# Patient Record
Sex: Female | Born: 1955 | Race: Black or African American | Hispanic: No | Marital: Married | Smoking: Never smoker
Health system: Southern US, Community
[De-identification: ages and names within clinical notes are randomized; demographics above are authoritative.]

## PROBLEM LIST (undated history)

## (undated) DIAGNOSIS — H269 Unspecified cataract: Secondary | ICD-10-CM

## (undated) DIAGNOSIS — Z803 Family history of malignant neoplasm of breast: Secondary | ICD-10-CM

## (undated) DIAGNOSIS — N189 Chronic kidney disease, unspecified: Secondary | ICD-10-CM

## (undated) DIAGNOSIS — D649 Anemia, unspecified: Secondary | ICD-10-CM

## (undated) DIAGNOSIS — Z9889 Other specified postprocedural states: Secondary | ICD-10-CM

## (undated) DIAGNOSIS — E785 Hyperlipidemia, unspecified: Secondary | ICD-10-CM

## (undated) DIAGNOSIS — Z807 Family history of other malignant neoplasms of lymphoid, hematopoietic and related tissues: Secondary | ICD-10-CM

## (undated) DIAGNOSIS — R112 Nausea with vomiting, unspecified: Secondary | ICD-10-CM

## (undated) DIAGNOSIS — T7840XA Allergy, unspecified, initial encounter: Secondary | ICD-10-CM

## (undated) DIAGNOSIS — G4730 Sleep apnea, unspecified: Secondary | ICD-10-CM

## (undated) DIAGNOSIS — C801 Malignant (primary) neoplasm, unspecified: Secondary | ICD-10-CM

## (undated) DIAGNOSIS — E119 Type 2 diabetes mellitus without complications: Secondary | ICD-10-CM

## (undated) DIAGNOSIS — M1990 Unspecified osteoarthritis, unspecified site: Secondary | ICD-10-CM

## (undated) DIAGNOSIS — I10 Essential (primary) hypertension: Secondary | ICD-10-CM

## (undated) DIAGNOSIS — Z87442 Personal history of urinary calculi: Secondary | ICD-10-CM

## (undated) DIAGNOSIS — K219 Gastro-esophageal reflux disease without esophagitis: Secondary | ICD-10-CM

## (undated) HISTORY — PX: TONSILLECTOMY: SUR1361

## (undated) HISTORY — PX: OTHER SURGICAL HISTORY: SHX169

## (undated) HISTORY — DX: Type 2 diabetes mellitus without complications: E11.9

## (undated) HISTORY — DX: Allergy, unspecified, initial encounter: T78.40XA

## (undated) HISTORY — DX: Family history of malignant neoplasm of breast: Z80.3

## (undated) HISTORY — DX: Hyperlipidemia, unspecified: E78.5

## (undated) HISTORY — PX: CYSTOSCOPY: SUR368

## (undated) HISTORY — DX: Family history of other malignant neoplasms of lymphoid, hematopoietic and related tissues: Z80.7

## (undated) HISTORY — DX: Essential (primary) hypertension: I10

---

## 1997-06-07 HISTORY — PX: BREAST SURGERY: SHX581

## 1998-03-31 ENCOUNTER — Ambulatory Visit (HOSPITAL_BASED_OUTPATIENT_CLINIC_OR_DEPARTMENT_OTHER): Admission: RE | Admit: 1998-03-31 | Discharge: 1998-03-31 | Payer: Self-pay | Admitting: Specialist

## 1999-02-18 ENCOUNTER — Other Ambulatory Visit: Admission: RE | Admit: 1999-02-18 | Discharge: 1999-02-18 | Payer: Self-pay | Admitting: Obstetrics and Gynecology

## 2000-05-17 ENCOUNTER — Other Ambulatory Visit: Admission: RE | Admit: 2000-05-17 | Discharge: 2000-05-17 | Payer: Self-pay | Admitting: Obstetrics and Gynecology

## 2000-05-23 ENCOUNTER — Encounter: Payer: Self-pay | Admitting: Internal Medicine

## 2000-05-23 ENCOUNTER — Encounter: Admission: RE | Admit: 2000-05-23 | Discharge: 2000-05-23 | Payer: Self-pay | Admitting: Internal Medicine

## 2000-05-27 ENCOUNTER — Encounter: Payer: Self-pay | Admitting: Internal Medicine

## 2000-05-27 ENCOUNTER — Encounter: Admission: RE | Admit: 2000-05-27 | Discharge: 2000-05-27 | Payer: Self-pay | Admitting: Internal Medicine

## 2000-06-07 HISTORY — PX: VAGINAL HYSTERECTOMY: SUR661

## 2000-08-09 ENCOUNTER — Other Ambulatory Visit: Admission: RE | Admit: 2000-08-09 | Discharge: 2000-08-09 | Payer: Self-pay | Admitting: Obstetrics and Gynecology

## 2000-08-09 ENCOUNTER — Encounter (INDEPENDENT_AMBULATORY_CARE_PROVIDER_SITE_OTHER): Payer: Self-pay | Admitting: Specialist

## 2000-11-17 ENCOUNTER — Inpatient Hospital Stay (HOSPITAL_COMMUNITY): Admission: RE | Admit: 2000-11-17 | Discharge: 2000-11-22 | Payer: Self-pay | Admitting: Obstetrics and Gynecology

## 2000-11-17 ENCOUNTER — Encounter (INDEPENDENT_AMBULATORY_CARE_PROVIDER_SITE_OTHER): Payer: Self-pay | Admitting: Specialist

## 2000-11-18 ENCOUNTER — Encounter: Payer: Self-pay | Admitting: Obstetrics and Gynecology

## 2000-11-20 ENCOUNTER — Encounter: Payer: Self-pay | Admitting: Obstetrics and Gynecology

## 2001-06-07 HISTORY — PX: CHOLECYSTECTOMY: SHX55

## 2002-02-10 ENCOUNTER — Ambulatory Visit (HOSPITAL_COMMUNITY): Admission: EM | Admit: 2002-02-10 | Discharge: 2002-02-10 | Payer: Self-pay | Admitting: *Deleted

## 2002-03-09 ENCOUNTER — Encounter (INDEPENDENT_AMBULATORY_CARE_PROVIDER_SITE_OTHER): Payer: Self-pay | Admitting: *Deleted

## 2002-03-09 ENCOUNTER — Observation Stay (HOSPITAL_COMMUNITY): Admission: RE | Admit: 2002-03-09 | Discharge: 2002-03-10 | Payer: Self-pay

## 2005-02-02 ENCOUNTER — Ambulatory Visit: Payer: Self-pay

## 2006-03-30 ENCOUNTER — Ambulatory Visit (HOSPITAL_COMMUNITY): Admission: RE | Admit: 2006-03-30 | Discharge: 2006-03-30 | Payer: Self-pay | Admitting: Internal Medicine

## 2007-11-06 HISTORY — PX: OTHER SURGICAL HISTORY: SHX169

## 2007-11-08 ENCOUNTER — Ambulatory Visit (HOSPITAL_COMMUNITY): Admission: RE | Admit: 2007-11-08 | Discharge: 2007-11-08 | Payer: Self-pay | Admitting: Internal Medicine

## 2009-01-22 ENCOUNTER — Ambulatory Visit (HOSPITAL_COMMUNITY): Admission: RE | Admit: 2009-01-22 | Discharge: 2009-01-22 | Payer: Self-pay | Admitting: Internal Medicine

## 2010-04-23 ENCOUNTER — Encounter: Admission: RE | Admit: 2010-04-23 | Discharge: 2010-04-23 | Payer: Self-pay | Admitting: Family Medicine

## 2010-05-25 ENCOUNTER — Ambulatory Visit (HOSPITAL_COMMUNITY)
Admission: RE | Admit: 2010-05-25 | Discharge: 2010-05-25 | Payer: Self-pay | Source: Home / Self Care | Attending: Urology | Admitting: Urology

## 2010-05-26 ENCOUNTER — Ambulatory Visit (HOSPITAL_COMMUNITY)
Admission: RE | Admit: 2010-05-26 | Discharge: 2010-05-26 | Payer: Self-pay | Source: Home / Self Care | Attending: Urology | Admitting: Urology

## 2010-08-17 LAB — GLUCOSE, CAPILLARY
Glucose-Capillary: 117 mg/dL — ABNORMAL HIGH (ref 70–99)
Glucose-Capillary: 117 mg/dL — ABNORMAL HIGH (ref 70–99)
Glucose-Capillary: 149 mg/dL — ABNORMAL HIGH (ref 70–99)

## 2010-08-17 LAB — COMPREHENSIVE METABOLIC PANEL
ALT: 22 U/L (ref 0–35)
AST: 18 U/L (ref 0–37)
Albumin: 3.5 g/dL (ref 3.5–5.2)
Alkaline Phosphatase: 61 U/L (ref 39–117)
BUN: 17 mg/dL (ref 6–23)
CO2: 28 mEq/L (ref 19–32)
Calcium: 9 mg/dL (ref 8.4–10.5)
Chloride: 104 mEq/L (ref 96–112)
Creatinine, Ser: 0.88 mg/dL (ref 0.4–1.2)
GFR calc Af Amer: >60 mL/min (ref >60)
GFR calc non Af Amer: >60 mL/min (ref >60)
Glucose, Bld: 114 mg/dL — ABNORMAL HIGH (ref 70–99)
Potassium: 4.4 mEq/L (ref 3.5–5.1)
Sodium: 139 mEq/L (ref 135–145)
Total Bilirubin: 0.4 mg/dL (ref 0.3–1.2)
Total Protein: 7 g/dL (ref 6.0–8.3)

## 2010-08-17 LAB — SURGICAL PCR SCREEN
MRSA, PCR: NEGATIVE
Staphylococcus aureus: POSITIVE — AB

## 2010-10-23 NOTE — Op Note (Signed)
NAME:  Tracy Garrett, Tracy Garrett                     ACCOUNT NO.:  [id number]   MEDICAL RECORD NO.:  [id number]                   PATIENT TYPE:  AMB   LOCATION:  DAY                                  FACILITY:  [facility]   PHYSICIAN:  Skeet Simmer., M.D.         DATE OF BIRTH:  05-Oct-1955   DATE OF PROCEDURE:  03/09/2002  DATE OF DISCHARGE:                                 OPERATIVE REPORT   PREOPERATIVE DIAGNOSES:  Symptomatic gallstones and chronic cholecystitis.   POSTOPERATIVE DIAGNOSES:  Symptomatic gallstones and chronic cholecystitis.   OPERATION:  Laparoscopic cholecystectomy.   SURGEON:  Zigmund Daniel, M.D.   ASSISTANT:  Gabrielle Dare. Janee Morn, M.D.   ANESTHESIA:  General.   DESCRIPTION OF PROCEDURE:  After the patient was monitored and anesthetized  and had routine preparation and draping of the abdomen,  I liberally infused  0.5% bupivacaine with epinephrine just inferior to the umbilicus and made a  transverse incision about 2 cm long, dissected down through the fat until I  identified the abdominal fascia and incised it longitudinally. After bluntly  entering the peritoneal cavity, placing #0 Vicryl pursestring suture in the  fascia, securing a Hasson cannula with the pursestring and inflating the  abdomen with CO2, I examined the abdominal organs. I saw no abnormalities  except for adhesions of the colon, duodenum and omentum to the undersurface  of the gallbladder. After anesthetizing three additional port sites and  placing three additional ports, I grasped the fundus of the gallbladder and  elevated it toward the right shoulder. With the patient head up, foot down  and tilted to the left, I carefully took down the adhesions exposing the  infundibulum of the gallbladder. I pulled it laterally and dissected in the  area of the hepatoduodenal ligament until I clearly identified the cystic  duct emerging from the infundibulum of the gallbladder and clearly  identified  the common bile duct well away from it. I flipped the cystic duct  with four clips and cut it between the two which were closest to the  gallbladder. I then identified the cystic artery and similarly clipped and  divided it. I dissected the gallbladder from the liver utilizing the hook  and spatula cauteries and then got good hemostasis in the gallbladder fossa  with the cautery. In taking the gallbladder off, I accidentally made a  couple of holes in it. Some stones spilled out and I suctioned those away  and suctioned away the bile and thoroughly irrigated it to dilute it out.  After removing the gallbladder from the liver, I placed it in a plastic  pouch and then removed it from the body through the umbilical incision. I  then further irrigated the right upper quadrant and removed the irrigant  until it was clear. I assured good hemostasis and then removed the lateral  ports under direct vision after tying the pursestring suture in the  umbilical incision. I allowed the  CO2 to escape and then removed the  epigastric port. I closed all skin incisions with intracuticular 4-0 Vicryl  and Steri-Strips and applied bandages. The patient tolerated the operation  well.                                               Skeet Simmer., M.D.    Elvis Coil  D:  03/09/2002  T:  03/09/2002  Job:  [id number]

## 2010-10-23 NOTE — H&P (Signed)
[facility]  Patient:    Tracy Garrett, Tracy Garrett                    MRN: [id number] Adm. Date:  11/17/00 Attending:  Nena Jordan A. Cherly Hensen, M.D.                         History and Physical  DATE OF BIRTH:                1956-02-18  CHIEF COMPLAINT:              Heavy menses and uterine fibroids.  HISTORY OF PRESENT ILLNESS:   This is a 55 year old gravida 7, para 2-0-5-2 married black female now being admitted for total abdominal hysterectomy secondary to symptomatic uterine fibroids.  The patient has had a long history of uterine fibroids.  She was found to be anemic by her primary care physician.  An ultrasound done on May 27, 2000 at [facility] had revealed a uterus that measured 15 cm in length with multiple fibroids.  The largest was in the posterior aspect of the uterus, measuring 8 cm.  The ovaries could not be visualized due to the enlarged fibroid.  The patient has been using two super tampons for years with her menstrual flow despite reporting clinically normal cycles in the past.  She felt this was a normal event, since it had been ongoing for years.  The patient underwent an endometrial biopsy on August 08, 2000 that showed disordered proliferative endometrium.  Pap in December 2000 was within normal limits.  She was placed on Lupron Depot therapy along with iron supplementation in order to increase her hemoglobin.  The patient now presents for definitive management of her symptomatology.  ALLERGIES:                    No known drug allergies.  MEDICATIONS:                  Iron supplementation.  Allegra p.r.n.  Her last Lupron Depot injections was in May 2002.  PAST MEDICAL HISTORY:         GERD.  Fibroid uterus.  History of hyperlipidemia.  Exogenous obesity.  PAST SURGICAL HISTORY:        Breast reduction in October 1999.  D&C x 5. Left breast cyst removal.  PAST OBSTETRIC HISTORY:       Five elective  terminations.  Two term vaginal deliveries.  FAMILY HISTORY:               Diabetes in sister and grandfather.  Breast cancer in an aunt, unclear age.  No ovarian cancer.  SOCIAL HISTORY:               She is married and a nonsmoker.  Two children. 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  REVIEW OF SYSTEMS:            Negative except for that noted in the history of present illness.  PHYSICAL EXAMINATION:  GENERAL:                      Well-developed, well-nourished obese black female in no acute distress.  VITAL SIGNS:                  Blood pressure 120/82, weight 206 lb.  SKIN:  No lesions.  HEENT:                        Anicteric sclerae.  Pink conjunctivae. Oropharynx negative.  HEART:                        Regular rate and rhythm without murmurs.  LUNGS:                        Clear to auscultation.  BREASTS:                      Soft and nontender.  No palpable mass.  Nipples without discharge.  ABDOMEN:                      Soft.  Obese.  Nontender.  PELVIC:                       The vulva showed no lesions.  The vagina had white discharge.  Wet prep done revealed mild yeast.  The cervix was closed and parous.  The uterus was 12 weeks in size.  Limited by the patients body habitus.  Adnexa also limited by the patients body habitus.  EXTREMITIES:                  No edema.  LABORATORY DATA:              Ultrasound done on Oct 24, 2000 showed multiple fibroids.  Both ovaries normal.  Mammogram in November 2001 was within normal limits.  ______.  IMPRESSION:                   Menorrhagia with associated iron-deficiency anemia secondary to uterine fibroids.  PLAN:                         Admission.  Exploratory laparotomy.  Total abdominal hysterectomy.  Antibiotic prophylaxis.  Antiembolic stockings.  The risks of the procedure have been explained to the patient including but not limited to infection; bleeding; injury  to surrounding organ structures such as bladder, bowel and ureter; internal scar tissue which may result in bowel obstruction in the future and pelvic pain; preservation of ovarian function with the risk of ovarian cyst in the future that may necessitate surgical intervention and ovarian cancer; fistula formation and the possible loss of ovaries was discussed.  Postoperative care and the criteria for discharge from the hospital were reviewed. DD:  11/17/00 TD:  11/17/00 Job: [id number]

## 2010-10-23 NOTE — Op Note (Signed)
[facility]  Patient:    Tracy Garrett, Tracy Garrett                  MRN: [id number] Proc. Date: 11/17/00 Adm. Date:  [id number] Attending:  Maxie Better                           Operative Report  PREOPERATIVE DIAGNOSIS:       Symptomatic uterine fibroids.  POSTOPERATIVE DIAGNOSIS:      Symptomatic uterine fibroids.  PROCEDURE:                    Exploratory laparotomy with total abdominal hysterectomy.  SURGEON:                      Sheronette A. Cherly Hensen, M.D.  ASSISTANT:                    Genia Del, M.D.  ANESTHESIA:                   General.  INDICATIONS:                  The patient is a 55 year old gravida 7, para 2-0-5-2 female admitted for total abdominal hysterectomy secondary to menorrhagia and uterine fibroids.  The patient also had associated iron-deficiency anemia as a result of her heavy menses.  The patient was placed on Lupron Depot while awaiting surgery, and now presents for definitive management of her problem.  The risks and benefits of the procedure have been explained to the patient and consent was signed.  The patient was transferred to the operating room.  DESCRIPTION OF PROCEDURE:     Under adequate general anesthesia, the patient was placed in the supine position.  Examination under anesthesia revealed an anteverted, irregular uterus approximately 12 weeks in size.  No adnexal masses could be appreciated.  The patient was sterilely prepped and draped in the usual fashion.  An indwelling Foley catheter was placed.  The vagina was also prepped.  Pneumoboots were in place and antibiotic prophylaxis was given. A Pfannenstiel skin incision was made and carried down to the rectus fascia. The rectus fascia was incised in the midline and extended bilaterally.  The rectus fascia was then bluntly and with cautery dissected off the rectus muscle in a superior and inferior fashion.  The rectus muscle was splint in the  midline.  The parietal peritoneum was entered and extended.  Exploration of the abdomen revealed normal liver edge, normal palpable kidneys, and normal appendix.  Attention was then turned to the pelvis, where there was a mobile, irregular uterus, about 12 weeks in size.  Normal tubes and ovaries were noted bilaterally.  The left round ligament was isolated, suture ligated with 0 Vicryl and severed using cautery.  The anterior vesicouterine peritoneum was then opened.  The right round ligament was also isolated, suture ligated with 0 Vicryl and severed using cautery.  The bladder was then bluntly dissected off the lower uterine segment.  The posterior leaf of the broad ligament was opened bilaterally.  The utero-ovarian ligaments were doubly clamped, cut and suture ligated with 0 Vicryl and free tied with 0 Vicryl bilaterally.  The uterine vessels were skeletonized bilaterally.  On the right, the uterine vessel was then doubly clamped, cut and suture ligated with 0 Vicryl x 2.  The left was singly clamped, cut and suture ligated with 0  Vicryl.  The cardinal ligaments were then serially clamped, cut and suture ligated using 0 Vicryl until the cervicovaginal junction was about to have been reached.  The vagina was opened anteriorly and the cervix was then removed from its vaginal attachment using the right angle scissors.  The angle sutures were then placed using 0 Vicryl.  The vaginal cuff was then closed with running lock suture of 0 Vicryl circumferentially.  On the posterior aspect of the cuff, there was bleeding from the peritoneal edge which was controlled using 3-0 and 2-0 Vicryl sutures.  The vaginal cuff was then closed vertically using interrupted 0 Vicryl sutures.  Small bleeders were cauterized.  The uterosacral ligaments had been incorporated in the angle of the vaginal cuff.  The ureters were located by palpation and were deep in the pelvis.  The uterus was removed along with the  cervix and sent to pathology.  The pelvis was irrigated and suctioned.  Small bleeders were identified and either suture ligated or cauterized.  When good hemostasis was subsequently noted, the abdomen was once again irrigated and suctioned of debris.  The parietal peritoneum was not closed.  The rectus muscle was inspected.  Small bleeders were cauterized. The rectus fascia on the surface was also inspected and small bleeders cauterized.  The rectus fascia was closed with 0 Vicryl x 2.  The subcutaneous fat was irrigated and small bleeders cauterized.  Marcaine 0.25% was injected subcuticularly and the skin approximated using Ethicon staples.  SPECIMENS:                    Uterus and cervix sent to pathology.  ESTIMATED BLOOD LOSS:         200 cc.  URINE OUTPUT:                 400 cc of clear yellow urine.  INTRAOPERATIVE FLUID:         2 L.  SPONGE AND INSTRUMENT COUNTS:            Correct x 2.  COMPLICATIONS:                None.  DISPOSITION:                  The patient tolerated the procedure well and was transferred to the recovery room in stable condition. DD:  11/17/00 TD:  11/18/00 Job: [id number]

## 2010-10-23 NOTE — Discharge Summary (Signed)
[facility]  Patient:    Tracy Garrett, Tracy Garrett                  MRN: [id number] Adm. Date:  [id number] Disc. Date: [id number] Attending:  Maxie Better                           Discharge Summary  ADMISSION DIAGNOSIS:          Symptomatic uterine fibroids.  DISCHARGE DIAGNOSES:          1. Symptomatic uterine fibroids.                               2. Postoperative fever, resolved.  PROCEDURE:                    1. Exploratory laparotomy.                               2. Total abdominal hysterectomy.  HISTORY OF PRESENT ILLNESS:   This is a 55 year old gravida 7, para 2-0-5-2 female admitted for a total abdominal hysterectomy secondary to menorrhagia due to uterine fibroids. The patient was placed on Lupron Depot injections while awaiting surgery. She was also on iron supplementation due to her low hematocrit.  HOSPITAL COURSE:              The patient was admitted. She was taken to the operating room where she underwent exploratory laparotomy, total abdominal hysterectomy. Findings at the time of surgery were about a 12-week size uterus, normal tubes and ovaries, normal appendix, normal liver edge and kidney.  Her postoperative course was remarkable for postoperative fever for which the patient had an extensive evaluation, etiology remained unclear. She underwent chest x-ray which was negative, urinalysis which was negative, blood cultures which were negative. CBC on postoperative day #1 showed a white count of 11.4, hematocrit of 34.2. The patient had her first temperature spike on postoperative day #1 to 100.3. She continued to have temperature spikes, and on postoperative day #3, the patient, who had desired to go home, still had a temperature maximum of 100.7. She was tolerating a regular diet. Her incision showed no evidence of erythema, induration, or exudate. It had been probed; scant amount of fluid was obtained. Pelvic ultrasound was  obtained which revealed a question of a complex area in the posterior cul-de-sac measuring 4.3 x 2.6 x 5.4 cm, therefore, a CT scan was obtained which showed small amount of fluid and no discrete abscess. The patient was started empirically on gentamicin and clindamycin. She subsequently defervesced and 24 hours after remaining still afebrile, the patient was discharged home on doxycycline 100 mg p.o. b.i.d. for five days and Flagyl 500 mg p.o. b.i.d. for five days.  DISPOSITION:                  Home.  CONDITION:                    Stable.  DISCHARGE MEDICATIONS:        1. Doxycycline 100 mg p.o. b.i.d. for five days.                               2. Flagyl 500 mg p.o. b.i.d. for five days.  3. Tylox one to two tablets every three to four                                  hours p.r.n. pain.                               4. Motrin 600 mg one p.o. q.6h. p.r.n. pain.  DISCHARGE INSTRUCTIONS:       Call for temperature greater than or equal to 100.4, take a temperature twice a day unless feels otherwise febrile. Call with increased incisional pain, redness, or drainage. Nothing per vagina for four to six weeks, no heavy lifting or driving for two weeks. Call with severe abdominal pain, nausea, or vomiting.  DISCHARGE FOLLOWUP:           The patient is to follow up with an appointment in one week at [facility] and at four to six weeks post surgery. DD:  12/08/00 TD:  12/08/00 Job: [id number]

## 2012-05-12 ENCOUNTER — Ambulatory Visit (INDEPENDENT_AMBULATORY_CARE_PROVIDER_SITE_OTHER): Payer: 59 | Admitting: Internal Medicine

## 2012-05-12 VITALS — BP 119/83 | HR 76 | Temp 98.2°F | Resp 16 | Ht 63.0 in | Wt 238.8 lb

## 2012-05-12 DIAGNOSIS — R519 Headache, unspecified: Secondary | ICD-10-CM

## 2012-05-12 DIAGNOSIS — J329 Chronic sinusitis, unspecified: Secondary | ICD-10-CM

## 2012-05-12 DIAGNOSIS — R51 Headache: Secondary | ICD-10-CM

## 2012-05-12 MED ORDER — IBUPROFEN 600 MG PO TABS: 600.0000 mg | ORAL_TABLET | Freq: Three times a day (TID) | ORAL | Status: DC | PRN

## 2012-05-12 MED ORDER — AMOXICILLIN 500 MG PO CAPS: 1000.0000 mg | ORAL_CAPSULE | Freq: Two times a day (BID) | ORAL | Status: DC

## 2012-05-12 NOTE — Patient Instructions (Addendum)
Migraine Headache A migraine headache is an intense, throbbing pain on one or both sides of your head. A migraine can last for 30 minutes to several hours. CAUSES  The exact cause of a migraine headache is not always known. However, a migraine may be caused when nerves in the brain become irritated and release chemicals that cause inflammation. This causes pain. SYMPTOMS  Pain on one or both sides of your head.  Pulsating or throbbing pain.  Severe pain that prevents daily activities.  Pain that is aggravated by any physical activity.  Nausea, vomiting, or both.  Dizziness.  Pain with exposure to bright lights, loud noises, or activity.  General sensitivity to bright lights, loud noises, or smells. Before you get a migraine, you may get warning signs that a migraine is coming (aura). An aura may include:  Seeing flashing lights.  Seeing bright spots, halos, or zig-zag lines.  Having tunnel vision or blurred vision.  Having feelings of numbness or tingling.  Having trouble talking.  Having muscle weakness. MIGRAINE TRIGGERS  Alcohol.  Smoking.  Stress.  Menstruation.  Aged cheeses.  Foods or drinks that contain nitrates, glutamate, aspartame, or tyramine.  Lack of sleep.  Chocolate.  Caffeine.  Hunger.  Physical exertion.  Fatigue.  Medicines used to treat chest pain (nitroglycerine), birth control pills, estrogen, and some blood pressure medicines. DIAGNOSIS  A migraine headache is often diagnosed based on:  Symptoms.  Physical examination.  A CT scan or MRI of your head. TREATMENT Medicines may be given for pain and nausea. Medicines can also be given to help prevent recurrent migraines.  HOME CARE INSTRUCTIONS  Only take over-the-counter or prescription medicines for pain or discomfort as directed by your caregiver. The use of long-term narcotics is not recommended.  Lie down in a dark, quiet room when you have a migraine.  Keep a journal  to find out what may trigger your migraine headaches. For example, write down:  What you eat and drink.  How much sleep you get.  Any change to your diet or medicines.  Limit alcohol consumption.  Quit smoking if you smoke.  Get 7 to 9 hours of sleep, or as recommended by your caregiver.  Limit stress.  Keep lights dim if bright lights bother you and make your migraines worse. SEEK IMMEDIATE MEDICAL CARE IF:   Your migraine becomes severe.  You have a fever.  You have a stiff neck.  You have vision loss.  You have muscular weakness or loss of muscle control.  You start losing your balance or have trouble walking.  You feel faint or pass out.  You have severe symptoms that are different from your first symptoms. MAKE SURE YOU:   Understand these instructions.  Will watch your condition.  Will get help right away if you are not doing well or get worse. Document Released: 05/24/2005 Document Revised: 08/16/2011 Document Reviewed: 05/14/2011 Exit[employer] Patient Information 2013 [employer].   Sinusitis Sinusitis is redness, soreness, and swelling (inflammation) of the paranasal sinuses. Paranasal sinuses are air pockets within the bones of your face (beneath the eyes, the middle of the forehead, or above the eyes). In healthy paranasal sinuses, mucus is able to drain out, and air is able to circulate through them by way of your nose. However, when your paranasal sinuses are inflamed, mucus and air can become trapped. This can allow bacteria and other germs to grow and cause infection. Sinusitis can develop quickly and last only a short time (  acute) or continue over a long period (chronic). Sinusitis that lasts for more than 12 weeks is considered chronic.  CAUSES  Causes of sinusitis include:  Allergies.  Structural abnormalities, such as displacement of the cartilage that separates your nostrils (deviated septum), which can decrease the air flow through your nose  and sinuses and affect sinus drainage.  Functional abnormalities, such as when the small hairs (cilia) that line your sinuses and help remove mucus do not work properly or are not present. SYMPTOMS  Symptoms of acute and chronic sinusitis are the same. The primary symptoms are pain and pressure around the affected sinuses. Other symptoms include:  Upper toothache.  Earache.  Headache.  Bad breath.  Decreased sense of smell and taste.  A cough, which worsens when you are lying flat.  Fatigue.  Fever.  Thick drainage from your nose, which often is green and may contain pus (purulent).  Swelling and warmth over the affected sinuses. DIAGNOSIS  Your caregiver will perform a physical exam. During the exam, your caregiver may:  Look in your nose for signs of abnormal growths in your nostrils (nasal polyps).  Tap over the affected sinus to check for signs of infection.  View the inside of your sinuses (endoscopy) with a special imaging device with a light attached (endoscope), which is inserted into your sinuses. If your caregiver suspects that you have chronic sinusitis, one or more of the following tests may be recommended:  Allergy tests.  Nasal culture A sample of mucus is taken from your nose and sent to a lab and screened for bacteria.  Nasal cytology A sample of mucus is taken from your nose and examined by your caregiver to determine if your sinusitis is related to an allergy. TREATMENT  Most cases of acute sinusitis are related to a viral infection and will resolve on their own within 10 days. Sometimes medicines are prescribed to help relieve symptoms (pain medicine, decongestants, nasal steroid sprays, or saline sprays).  However, for sinusitis related to a bacterial infection, your caregiver will prescribe antibiotic medicines. These are medicines that will help kill the bacteria causing the infection.  Rarely, sinusitis is caused by a fungal infection. In theses  cases, your caregiver will prescribe antifungal medicine. For some cases of chronic sinusitis, surgery is needed. Generally, these are cases in which sinusitis recurs more than 3 times per year, despite other treatments. HOME CARE INSTRUCTIONS   Drink plenty of water. Water helps thin the mucus so your sinuses can drain more easily.  Use a humidifier.  Inhale steam 3 to 4 times a day (for example, sit in the bathroom with the shower running).  Apply a warm, moist washcloth to your face 3 to 4 times a day, or as directed by your caregiver.  Use saline nasal sprays to help moisten and clean your sinuses.  Take over-the-counter or prescription medicines for pain, discomfort, or fever only as directed by your caregiver. SEEK IMMEDIATE MEDICAL CARE IF:  You have increasing pain or severe headaches.  You have nausea, vomiting, or drowsiness.  You have swelling around your face.  You have vision problems.  You have a stiff neck.  You have difficulty breathing. MAKE SURE YOU:   Understand these instructions.  Will watch your condition.  Will get help right away if you are not doing well or get worse. Document Released: 05/24/2005 Document Revised: 08/16/2011 Document Reviewed: 06/08/2011 [employer] Patient Information 2013 [employer].  

## 2012-05-12 NOTE — Progress Notes (Signed)
  Subjective:    Patient ID: Tracy Garrett, female    DOB: 06/24/1955, 56 y.o.   MRN: [id number]  HPI Has head and face congestion and mild cough and st. No sob or cp.    Review of Systems     Objective:   Physical Exam  Vitals reviewed. Constitutional: She is oriented to person, place, and time. She appears well-nourished. No distress.  HENT:  Right Ear: External ear normal.  Left Ear: External ear normal.  Nose: Mucosal edema, rhinorrhea and sinus tenderness present. Right sinus exhibits maxillary sinus tenderness and frontal sinus tenderness. Left sinus exhibits maxillary sinus tenderness and frontal sinus tenderness.  Mouth/Throat: Oropharynx is clear and moist.  Neck: Neck supple.  Cardiovascular: Normal rate, regular rhythm and normal heart sounds.   Pulmonary/Chest: Effort normal and breath sounds normal.  Lymphadenopathy:    She has no cervical adenopathy.  Neurological: She is alert and oriented to person, place, and time. She displays a negative Romberg sign. Gait normal.  Skin: Skin is warm and dry.  Psychiatric: She has a normal mood and affect.          Assessment & Plan:  Amoxil/ mucinex/medipot

## 2013-02-07 ENCOUNTER — Ambulatory Visit (INDEPENDENT_AMBULATORY_CARE_PROVIDER_SITE_OTHER): Payer: 59 | Admitting: Emergency Medicine

## 2013-02-07 VITALS — BP 122/82 | HR 67 | Temp 98.3°F | Resp 18 | Ht 62.5 in | Wt 233.0 lb

## 2013-02-07 DIAGNOSIS — L0100 Impetigo, unspecified: Secondary | ICD-10-CM

## 2013-02-07 MED ORDER — SULFAMETHOXAZOLE-TRIMETHOPRIM 800-160 MG PO TABS: 1.0000 | ORAL_TABLET | Freq: Two times a day (BID) | ORAL | Status: DC

## 2013-02-07 MED ORDER — MUPIROCIN 2 % EX OINT: TOPICAL_OINTMENT | Freq: Three times a day (TID) | CUTANEOUS | Status: DC

## 2013-02-07 NOTE — Progress Notes (Signed)
[facility] [address] [place] [address] [phone]  Date:  02/07/2013   Name:  Tracy Garrett   DOB:  01-19-1956   MRN:  [id number]  PCP:  Nadean Corwin, MD    Chief Complaint: Rash   History of Present Illness:  Tracy Garrett is a 57 y.o. very pleasant female patient who presents with the following:  Developed a rash on her chin yesterday was erythematous and now has pustular fine eruption.  No fever or chills.   There are no active problems to display for this patient.   Past Medical History  Diagnosis Date  . Allergy   . Diabetes mellitus without complication     Past Surgical History  Procedure Laterality Date  . Breast surgery      History  Substance Use Topics  . Smoking status: Never Smoker   . Smokeless tobacco: Not on file  . Alcohol Use: Not on file    Family History  Problem Relation Age of Onset  . Cancer Mother     No Known Allergies  Medication list has been reviewed and updated.  Current Outpatient Prescriptions on File Prior to Visit  Medication Sig Dispense Refill  . amoxicillin (AMOXIL) 500 MG capsule Take 2 capsules (1,000 mg total) by mouth 2 (two) times daily.  40 capsule  0  . ibuprofen (ADVIL,MOTRIN) 600 MG tablet Take 1 tablet (600 mg total) by mouth every 8 (eight) hours as needed for pain.  30 tablet  0   No current facility-administered medications on file prior to visit.    Review of Systems:  As per HPI, otherwise negative.    Physical Examination: Filed Vitals:   02/07/13 1042  BP: 122/82  Pulse: 67  Temp: 98.3 F (36.8 C)  Resp: 18   Filed Vitals:   02/07/13 1042  Height: 5' 2.5" (1.588 m)  Weight: 233 lb (105.688 kg)   Body mass index is 41.91 kg/(m^2). Ideal Body Weight: Weight in (lb) to have BMI = 25: 138.6   GEN: WDWN, NAD, Non-toxic, Alert & Oriented x 3 HEENT: Atraumatic, Normocephalic.  Ears and Nose: No external deformity. EXTR: No  clubbing/cyanosis/edema NEURO: Normal gait.  PSYCH: Normally interactive. Conversant. Not depressed or anxious appearing.  Calm demeanor.  SKIN:  Chin characteristic of impetigo   Assessment and Plan: Impetigo Septra bactroban   Signed,  Phillips Odor, MD

## 2013-02-07 NOTE — Patient Instructions (Signed)
Impetigo Impetigo is an infection of the skin, most common in babies and children.  CAUSES  It is caused by staphylococcal or streptococcal germs (bacteria). Impetigo can start after any damage to the skin. The damage to the skin may be from things like:   Chickenpox.  Scrapes.  Scratches.  Insect bites (common when children scratch the bite).  Cuts.  Nail biting or chewing. Impetigo is contagious. It can be spread from one person to another. Avoid close skin contact, or sharing towels or clothing. SYMPTOMS  Impetigo usually starts out as small blisters or pustules. Then they turn into tiny yellow-crusted sores (lesions).  There may also be:  Large blisters.  Itching or pain.  Pus.  Swollen lymph glands. With scratching, irritation, or non-treatment, these small areas may get larger. Scratching can cause the germs to get under the fingernails; then scratching another part of the skin can cause the infection to be spread there. DIAGNOSIS  Diagnosis of impetigo is usually made by a physical exam. A skin culture (test to grow bacteria) may be done to prove the diagnosis or to help decide the best treatment.  TREATMENT  Mild impetigo can be treated with prescription antibiotic cream. Oral antibiotic medicine may be used in more severe cases. Medicines for itching may be used. HOME CARE INSTRUCTIONS   To avoid spreading impetigo to other body areas:  Keep fingernails short and clean.  Avoid scratching.  Cover infected areas if necessary to keep from scratching.  Gently wash the infected areas with antibiotic soap and water.  Soak crusted areas in warm soapy water using antibiotic soap.  Gently rub the areas to remove crusts. Do not scrub.  Wash hands often to avoid spread this infection.  Keep children with impetigo home from school or daycare until they have used an antibiotic cream for 48 hours (2 days) or oral antibiotic medicine for 24 hours (1 day), and their skin  shows significant improvement.  Children may attend school or daycare if they only have a few sores and if the sores can be covered by a bandage or clothing. SEEK MEDICAL CARE IF:   More blisters or sores show up despite treatment.  Other family members get sores.  Rash is not improving after 48 hours (2 days) of treatment. SEEK IMMEDIATE MEDICAL CARE IF:   You see spreading redness or swelling of the skin around the sores.  You see red streaks coming from the sores.  Your child develops a fever of 100.4 F (37.2 C) or higher.  Your child develops a sore throat.  Your child is acting ill (lethargic, sick to their stomach). Document Released: 05/21/2000 Document Revised: 08/16/2011 Document Reviewed: 03/20/2008 Exit[employer] Patient Information 2014 [employer].  

## 2013-02-08 ENCOUNTER — Ambulatory Visit (INDEPENDENT_AMBULATORY_CARE_PROVIDER_SITE_OTHER): Payer: 59 | Admitting: Family Medicine

## 2013-02-08 ENCOUNTER — Telehealth: Payer: Self-pay | Admitting: Radiology

## 2013-02-08 VITALS — BP 130/84 | HR 87 | Temp 98.9°F | Resp 20 | Wt 233.0 lb

## 2013-02-08 DIAGNOSIS — L0201 Cutaneous abscess of face: Secondary | ICD-10-CM

## 2013-02-08 DIAGNOSIS — T7840XA Allergy, unspecified, initial encounter: Secondary | ICD-10-CM

## 2013-02-08 DIAGNOSIS — T7840XS Allergy, unspecified, sequela: Secondary | ICD-10-CM

## 2013-02-08 DIAGNOSIS — T788XXS Other adverse effects, not elsewhere classified, sequela: Secondary | ICD-10-CM

## 2013-02-08 LAB — POCT CBC
HCT, POC: 43.5 % (ref 37.7–47.9)
Hemoglobin: 13.8 g/dL (ref 12.2–16.2)
Lymph, poc: 3 (ref 0.6–3.4)
MCHC: 31.7 g/dL — AB (ref 31.8–35.4)
POC Granulocyte: 5.8 (ref 2–6.9)
WBC: 9.5 10*3/uL (ref 4.6–10.2)

## 2013-02-08 MED ORDER — DOXYCYCLINE HYCLATE 100 MG PO TABS: 100.0000 mg | ORAL_TABLET | Freq: Two times a day (BID) | ORAL | Status: DC

## 2013-02-08 NOTE — Telephone Encounter (Signed)
Patient advised of the appt. With Dr Terri Piedra and she will come in tomorrow am to see Dr Neva Seat as well

## 2013-02-08 NOTE — Telephone Encounter (Signed)
[facility] need patient worked in today. Left message for them to call me back, to you FYI in case you get the call.

## 2013-02-08 NOTE — Patient Instructions (Addendum)
Stop the Septra as this may be causing an allergic reaction/lip swelling.  The chin rash and swelling could be due to contact with an irritant, or infection.  Start new antibiotic - doxycycline. Ok to continue bactroban ointment for now, and we are trying to get you seen by a dermatologist. You can tak zyrtec or Benadryl over the counter for itching, zantac 150mg  - twice per day for itching, soap and water cleansing 3 times per day, and recheck with me tomorrow morning. .Return to the clinic or go to the nearest emergency room if any of your symptoms worsen or new symptoms occur.  Discussed benadryl today - every 4-6 hours, then can change to Zyrtec as symptoms improve.

## 2013-02-08 NOTE — Progress Notes (Signed)
Subjective:    Patient ID: Tracy Garrett, female    DOB: Mar 14, 1956, 57 y.o.   MRN: [id number]  Tracy Garrett is a 57 y.o. female Seen yesterday here for rash on chin - dx as Impetigo. Treated with Septra and Bactroban.  Initially noticed a few red itchy bumps on chin 2 days ago, more red and slight swelling. No lip swelling then. Started septra last night, ointment last night. Started with some lip swelling last night. Woke up with both lips swollen this morning. No fever. No throat tightness, no difficulty swallowing. No shortness of breath or cough. Feels ok otherwise.   S/p 3 doses of Septra, 3 doses of bactroban. No known hx of sulfa allergy.  No genital rash/lesion. No mouth ulcers/blisters.   Ate crab legs last weekend, but no known shellfish allergy. stayed at hotel in [facility], used own soaps. 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   No new creams/lotions/derm products, no outside yardwork   Review of Systems  Constitutional: Negative for fever and chills.  HENT: Negative for sore throat, drooling, mouth sores, trouble swallowing, neck stiffness and dental problem.   Respiratory: Negative for shortness of breath, wheezing and stridor.   Cardiovascular: Negative for chest pain.  Skin: Positive for color change.       Objective:   Physical Exam  Vitals reviewed. Constitutional: She is oriented to person, place, and time. She appears well-developed and well-nourished. No distress.  HENT:  Head: Normocephalic and atraumatic.    Nose: Nose normal.  Mouth/Throat: Oropharynx is clear and moist. No oral lesions.    Eyes: Conjunctivae and lids are normal.  Neck: Trachea normal.    Enlarged submental node, ttp.   Pulmonary/Chest: Effort normal and breath sounds normal. No stridor. No respiratory distress. She has no wheezes.  Neurological: She is alert and oriented to person, place, and time.  Skin: Rash (chin  only, no peripheral rash or other lesions. ) noted. There is erythema.  Psychiatric: She has a normal mood and affect. Her behavior is normal.   Culture swab obtained -small pustule on front of chin and honey colored crust. Results for orders placed in visit on 02/08/13  POCT CBC      Result Value Range   WBC 9.5  4.6 - 10.2 K/uL   Lymph, poc 3.0  0.6 - 3.4   POC LYMPH PERCENT 31.7  10 - 50 %L   MID (cbc) 0.7  0 - 0.9   POC MID % 7.2  0 - 12 %M   POC Granulocyte 5.8  2 - 6.9   Granulocyte percent 61.1  37 - 80 %G   RBC 4.66  4.04 - 5.48 M/uL   Hemoglobin 13.8  12.2 - 16.2 g/dL   HCT, POC 40.9  81.1 - 47.9 %   MCV 93.4  80 - 97 fL   MCH, POC 29.6  27 - 31.2 pg   MCHC 31.7 (*) 31.8 - 35.4 g/dL   RDW, POC 91.4     Platelet Count, POC 275  142 - 424 K/uL   MPV 9.2  0 - 99.8 fL      Assessment & Plan:  Tracy Garrett is a 57 y.o. female  Allergic reaction, initial encounter - possible allergy to Septra vs. contact dermatitis. Start zantac over the counter - 150mg  BID, benadryl otc 1-2 Q4-6 hours, then as improving can change to zyrtec. Will refer to dermatology in next  2 days for eval.  Contact derm vs med reaction (septra with new lip swelling/angioedema) vs cellulitis as below.   Cellulitis and abscess of face - Plan: doxycycline (VIBRA-TABS) 100 MG tablet - stop Septra, ok to continue bactroban for now, but will have evaluated by dermatology. Recheck in am with me, then scheduled with dermatology tomorrow afternoon.  overnight ER precautions discussed.   Meds ordered this encounter  Medications  . doxycycline (VIBRA-TABS) 100 MG tablet    Sig: Take 1 tablet (100 mg total) by mouth 2 (two) times daily.    Dispense:  20 tablet    Refill:  0   Patient Instructions  Stop the Septra as this may be causing an allergic reaction/lip swelling.  The chin rash and swelling could be due to contact with an irritant, or infection.  Start new antibiotic - doxycycline. Ok to continue  bactroban ointment for now, and we are trying to get you seen by a dermatologist. You can tak zyrtec or Benadryl over the counter for itching, zantac 150mg  - twice per day for itching, soap and water cleansing 3 times per day, and recheck with me tomorrow morning. .Return to the clinic or go to the nearest emergency room if any of your symptoms worsen or new symptoms occur.  Discussed benadryl today - every 4-6 hours, then can change to Zyrtec as symptoms improve.

## 2013-02-09 ENCOUNTER — Ambulatory Visit: Payer: 59

## 2013-02-11 LAB — WOUND CULTURE
Gram Stain: NONE SEEN
Organism ID, Bacteria: NO GROWTH

## 2013-02-21 ENCOUNTER — Encounter: Payer: Self-pay | Admitting: Radiology

## 2013-02-21 DIAGNOSIS — L309 Dermatitis, unspecified: Secondary | ICD-10-CM

## 2013-04-22 ENCOUNTER — Ambulatory Visit (INDEPENDENT_AMBULATORY_CARE_PROVIDER_SITE_OTHER): Payer: 59 | Admitting: Family Medicine

## 2013-04-22 VITALS — BP 120/76 | HR 91 | Temp 99.2°F | Resp 18 | Ht 63.0 in | Wt 233.0 lb

## 2013-04-22 DIAGNOSIS — K219 Gastro-esophageal reflux disease without esophagitis: Secondary | ICD-10-CM

## 2013-04-22 DIAGNOSIS — M26629 Arthralgia of temporomandibular joint, unspecified side: Secondary | ICD-10-CM

## 2013-04-22 DIAGNOSIS — M436 Torticollis: Secondary | ICD-10-CM

## 2013-04-22 DIAGNOSIS — M62838 Other muscle spasm: Secondary | ICD-10-CM

## 2013-04-22 MED ORDER — NABUMETONE 500 MG PO TABS: 1000.0000 mg | ORAL_TABLET | Freq: Two times a day (BID) | ORAL | Status: DC | PRN

## 2013-04-22 MED ORDER — CYCLOBENZAPRINE HCL 10 MG PO TABS: 10.0000 mg | ORAL_TABLET | Freq: Three times a day (TID) | ORAL | Status: DC | PRN
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
## 2013-04-22 NOTE — Patient Instructions (Addendum)
Adjust your sleeping arrangement so that you are just using 1 supportive pillow - your neck needs to be in line with the rest of your spine. I think that likely the way you have been sleeping propped up on several pillows is the cause of your neck pain.   If your bed needs to be tilted due to acid reflux - then do this by placing a brick or two under the feet of the head of your bed - not using pillows. Use a nightly bite guard if you want to have any chance of reducing your TMJ pain. Do not use any other nsaids while on relafen.  Cervical Strain and Sprain (Whiplash) with Rehab Cervical strain and sprains are injuries that commonly occur with "whiplash" injuries. Whiplash occurs when the neck is forcefully whipped backward or forward, such as during a motor vehicle accident. The muscles, ligaments, tendons, discs and nerves of the neck are susceptible to injury when this occurs. SYMPTOMS   Pain or stiffness in the front and/or back of neck  Symptoms may present immediately or up to 24 hours after injury.  Dizziness, headache, nausea and vomiting.  Muscle spasm with soreness and stiffness in the neck.  Tenderness and swelling at the injury site. CAUSES  Whiplash injuries often occur during contact sports or motor vehicle accidents.  RISK INCREASES WITH:  Osteoarthritis of the spine.  Situations that make head or neck accidents or trauma more likely.  High-risk sports (football, rugby, wrestling, hockey, auto racing, gymnastics, diving, contact karate or boxing).  Poor strength and flexibility of the neck.  Previous neck injury.  Poor tackling technique.  Improperly fitted or padded equipment. PREVENTION  Learn and use proper technique (avoid tackling with the head, spearing and head-butting; use proper falling techniques to avoid landing on the head).  Warm up and stretch properly before activity.  Maintain physical fitness:  Strength, flexibility and  endurance.  Cardiovascular fitness.  Wear properly fitted and padded protective equipment, such as padded soft collars, for participation in contact sports. PROGNOSIS  Recovery for cervical strain and sprain injuries is dependent on the extent of the injury. These injuries are usually curable in 1 week to 3 months with appropriate treatment.  RELATED COMPLICATIONS   Temporary numbness and weakness may occur if the nerve roots are damaged, and this may persist until the nerve has completely healed.  Chronic pain due to frequent recurrence of symptoms.  Prolonged healing, especially if activity is resumed too soon (before complete recovery). TREATMENT  Treatment initially involves the use of ice and medication to help reduce pain and inflammation. It is also important to perform strengthening and stretching exercises and modify activities that worsen symptoms so the injury does not get worse. These exercises may be performed at home or with a therapist. For patients who experience severe symptoms, a soft padded collar may be recommended to be worn around the neck.  Improving your posture may help reduce symptoms. Posture improvement includes pulling your chin and abdomen in while sitting or standing. If you are sitting, sit in a firm chair with your buttocks against the back of the chair. While sleeping, try replacing your pillow with a small towel rolled to 2 inches in diameter, or use a cervical pillow or soft cervical collar. Poor sleeping positions delay healing.  For patients with nerve root damage, which causes numbness or weakness, the use of a cervical traction apparatus may be recommended. Surgery is rarely necessary for these injuries. However, cervical  strain and sprains that are present at birth (congenital) may require surgery. MEDICATION   If pain medication is necessary, nonsteroidal anti-inflammatory medications, such as aspirin and ibuprofen, or other minor pain relievers, such as  acetaminophen, are often recommended.  Do not take pain medication for 7 days before surgery.  Prescription pain relievers may be given if deemed necessary by your caregiver. Use only as directed and only as much as you need. HEAT AND COLD:   Cold treatment (icing) relieves pain and reduces inflammation. Cold treatment should be applied for 10 to 15 minutes every 2 to 3 hours for inflammation and pain and immediately after any activity that aggravates your symptoms. Use ice packs or an ice massage.  Heat treatment may be used prior to performing the stretching and strengthening activities prescribed by your caregiver, physical therapist, or athletic trainer. Use a heat pack or a warm soak. SEEK MEDICAL CARE IF:   Symptoms get worse or do not improve in 2 weeks despite treatment.  New, unexplained symptoms develop (drugs used in treatment may produce side effects). EXERCISES RANGE OF MOTION (ROM) AND STRETCHING EXERCISES - Cervical Strain and Sprain These exercises may help you when beginning to rehabilitate your injury. In order to successfully resolve your symptoms, you must improve your posture. These exercises are designed to help reduce the forward-head and rounded-shoulder posture which contributes to this condition. Your symptoms may resolve with or without further involvement from your physician, physical therapist or athletic trainer. While completing these exercises, remember:   Restoring tissue flexibility helps normal motion to return to the joints. This allows healthier, less painful movement and activity.  An effective stretch should be held for at least 20 seconds, although you may need to begin with shorter hold times for comfort.  A stretch should never be painful. You should only feel a gentle lengthening or release in the stretched tissue. STRETCH- Axial Extensors  Lie on your back on the floor. You may bend your knees for comfort. Place a rolled up hand towel or dish  towel, about 2 inches in diameter, under the part of your head that makes contact with the floor.  Gently tuck your chin, as if trying to make a "double chin," until you feel a gentle stretch at the base of your head.  Hold __________ seconds. Repeat __________ times. Complete this exercise __________ times per day.  STRETECH - Axial Extension   Stand or sit on a firm surface. Assume a good posture: chest up, shoulders drawn back, abdominal muscles slightly tense, knees unlocked (if standing) and feet hip width apart.  Slowly retract your chin so your head slides back and your chin slightly lowers.Continue to look straight ahead.  You should feel a gentle stretch in the back of your head. Be certain not to feel an aggressive stretch since this can cause headaches later.  Hold for __________ seconds. Repeat __________ times. Complete this exercise __________ times per day. STRETCH  Cervical Side Bend   Stand or sit on a firm surface. Assume a good posture: chest up, shoulders drawn back, abdominal muscles slightly tense, knees unlocked (if standing) and feet hip width apart.  Without letting your nose or shoulders move, slowly tip your right / left ear to your shoulder until your feel a gentle stretch in the muscles on the opposite side of your neck.  Hold __________ seconds. Repeat __________ times. Complete this exercise __________ times per day. STRETCH  Cervical Rotators   Stand or sit on  a firm surface. Assume a good posture: chest up, shoulders drawn back, abdominal muscles slightly tense, knees unlocked (if standing) and feet hip width apart.  Keeping your eyes level with the ground, slowly turn your head until you feel a gentle stretch along the back and opposite side of your neck.  Hold __________ seconds. Repeat __________ times. Complete this exercise __________ times per day. RANGE OF MOTION - Neck Circles   Stand or sit on a firm surface. Assume a good posture: chest up,  shoulders drawn back, abdominal muscles slightly tense, knees unlocked (if standing) and feet hip width apart.  Gently roll your head down and around from the back of one shoulder to the back of the other. The motion should never be forced or painful.  Repeat the motion 10-20 times, or until you feel the neck muscles relax and loosen. Repeat __________ times. Complete the exercise __________ times per day. STRENGTHENING EXERCISES - Cervical Strain and Sprain These exercises may help you when beginning to rehabilitate your injury. They may resolve your symptoms with or without further involvement from your physician, physical therapist or athletic trainer. While completing these exercises, remember:   Muscles can gain both the endurance and the strength needed for everyday activities through controlled exercises.  Complete these exercises as instructed by your physician, physical therapist or athletic trainer. Progress the resistance and repetitions only as guided.  You may experience muscle soreness or fatigue, but the pain or discomfort you are trying to eliminate should never worsen during these exercises. If this pain does worsen, stop and make certain you are following the directions exactly. If the pain is still present after adjustments, discontinue the exercise until you can discuss the trouble with your clinician. STRENGTH Cervical Flexors, Isometric  Face a wall, standing about 6 inches away. Place a small pillow, a ball about 6-8 inches in diameter, or a folded towel between your forehead and the wall.  Slightly tuck your chin and gently push your forehead into the soft object. Push only with mild to moderate intensity, building up tension gradually. Keep your jaw and forehead relaxed.  Hold 10 to 20 seconds. Keep your breathing relaxed.  Release the tension slowly. Relax your neck muscles completely before you start the next repetition. Repeat __________ times. Complete this  exercise __________ times per day. STRENGTH- Cervical Lateral Flexors, Isometric   Stand about 6 inches away from a wall. Place a small pillow, a ball about 6-8 inches in diameter, or a folded towel between the side of your head and the wall.  Slightly tuck your chin and gently tilt your head into the soft object. Push only with mild to moderate intensity, building up tension gradually. Keep your jaw and forehead relaxed.  Hold 10 to 20 seconds. Keep your breathing relaxed.  Release the tension slowly. Relax your neck muscles completely before you start the next repetition. Repeat __________ times. Complete this exercise __________ times per day. STRENGTH  Cervical Extensors, Isometric   Stand about 6 inches away from a wall. Place a small pillow, a ball about 6-8 inches in diameter, or a folded towel between the back of your head and the wall.  Slightly tuck your chin and gently tilt your head back into the soft object. Push only with mild to moderate intensity, building up tension gradually. Keep your jaw and forehead relaxed.  Hold 10 to 20 seconds. Keep your breathing relaxed.  Release the tension slowly. Relax your neck muscles completely before you start  the next repetition. Repeat __________ times. Complete this exercise __________ times per day. POSTURE AND BODY MECHANICS CONSIDERATIONS - Cervical Strain and Sprain Keeping correct posture when sitting, standing or completing your activities will reduce the stress put on different body tissues, allowing injured tissues a chance to heal and limiting painful experiences. The following are general guidelines for improved posture. Your physician or physical therapist will provide you with any instructions specific to your needs. While reading these guidelines, remember:  The exercises prescribed by your provider will help you have the flexibility and strength to maintain correct postures.  The correct posture provides the optimal  environment for your joints to work. All of your joints have less wear and tear when properly supported by a spine with good posture. This means you will experience a healthier, less painful body.  Correct posture must be practiced with all of your activities, especially prolonged sitting and standing. Correct posture is as important when doing repetitive low-stress activities (typing) as it is when doing a single heavy-load activity (lifting). PROLONGED STANDING WHILE SLIGHTLY LEANING FORWARD When completing a task that requires you to lean forward while standing in one place for a long time, place either foot up on a stationary 2-4 inch high object to help maintain the best posture. When both feet are on the ground, the low back tends to lose its slight inward curve. If this curve flattens (or becomes too large), then the back and your other joints will experience too much stress, fatigue more quickly and can cause pain.  RESTING POSITIONS Consider which positions are most painful for you when choosing a resting position. If you have pain with flexion-based activities (sitting, bending, stooping, squatting), choose a position that allows you to rest in a less flexed posture. You would want to avoid curling into a fetal position on your side. If your pain worsens with extension-based activities (prolonged standing, working overhead), avoid resting in an extended position such as sleeping on your stomach. Most people will find more comfort when they rest with their spine in a more neutral position, neither too rounded nor too arched. Lying on a non-sagging bed on your side with a pillow between your knees, or on your back with a pillow under your knees will often provide some relief. Keep in mind, being in any one position for a prolonged period of time, no matter how correct your posture, can still lead to stiffness. WALKING Walk with an upright posture. Your ears, shoulders and hips should all  line-up. OFFICE WORK When working at a desk, create an environment that supports good, upright posture. Without extra support, muscles fatigue and lead to excessive strain on joints and other tissues. CHAIR:  A chair should be able to slide under your desk when your back makes contact with the back of the chair. This allows you to work closely.  The chair's height should allow your eyes to be level with the upper part of your monitor and your hands to be slightly lower than your elbows.  Body position:  Your feet should make contact with the floor. If this is not possible, use a foot rest.  Keep your ears over your shoulders. This will reduce stress on your neck and low back. Document Released: 05/24/2005 Document Revised: 09/18/2012 Document Reviewed: 09/05/2008 [facility] Patient Information 2014 [place], [place].

## 2013-07-06 ENCOUNTER — Encounter: Payer: Self-pay | Admitting: Internal Medicine

## 2013-08-24 DIAGNOSIS — E1169 Type 2 diabetes mellitus with other specified complication: Secondary | ICD-10-CM | POA: Insufficient documentation

## 2013-08-24 DIAGNOSIS — E785 Hyperlipidemia, unspecified: Secondary | ICD-10-CM

## 2013-08-24 DIAGNOSIS — E119 Type 2 diabetes mellitus without complications: Secondary | ICD-10-CM

## 2013-08-31 ENCOUNTER — Encounter: Payer: Self-pay | Admitting: Internal Medicine

## 2013-08-31 ENCOUNTER — Ambulatory Visit (INDEPENDENT_AMBULATORY_CARE_PROVIDER_SITE_OTHER): Payer: 59 | Admitting: Internal Medicine

## 2013-08-31 VITALS — BP 128/84 | HR 64 | Temp 98.2°F | Resp 16 | Ht 62.5 in | Wt 223.6 lb

## 2013-08-31 DIAGNOSIS — Z111 Encounter for screening for respiratory tuberculosis: Secondary | ICD-10-CM

## 2013-08-31 DIAGNOSIS — Z1212 Encounter for screening for malignant neoplasm of rectum: Secondary | ICD-10-CM

## 2013-08-31 DIAGNOSIS — E559 Vitamin D deficiency, unspecified: Secondary | ICD-10-CM

## 2013-08-31 DIAGNOSIS — Z113 Encounter for screening for infections with a predominantly sexual mode of transmission: Secondary | ICD-10-CM

## 2013-08-31 DIAGNOSIS — Z0000 Encounter for general adult medical examination without abnormal findings: Secondary | ICD-10-CM

## 2013-08-31 DIAGNOSIS — Z79899 Other long term (current) drug therapy: Secondary | ICD-10-CM | POA: Insufficient documentation

## 2013-08-31 DIAGNOSIS — G4733 Obstructive sleep apnea (adult) (pediatric): Secondary | ICD-10-CM

## 2013-08-31 DIAGNOSIS — I10 Essential (primary) hypertension: Secondary | ICD-10-CM | POA: Insufficient documentation

## 2013-08-31 DIAGNOSIS — R740 Nonspecific elevation of levels of transaminase and lactic acid dehydrogenase [LDH]: Secondary | ICD-10-CM

## 2013-08-31 DIAGNOSIS — R7401 Elevation of levels of liver transaminase levels: Secondary | ICD-10-CM

## 2013-08-31 DIAGNOSIS — E119 Type 2 diabetes mellitus without complications: Secondary | ICD-10-CM

## 2013-08-31 LAB — CBC WITH DIFFERENTIAL/PLATELET
BASOS PCT: 0 % (ref 0–1)
Basophils Absolute: 0 10*3/uL (ref 0.0–0.1)
Eosinophils Absolute: 0.3 10*3/uL (ref 0.0–0.7)
Eosinophils Relative: 3 % (ref 0–5)
HEMATOCRIT: 39.3 % (ref 36.0–46.0)
Hemoglobin: 13.8 g/dL (ref 12.0–15.0)
Lymphocytes Relative: 31 % (ref 12–46)
Lymphs Abs: 3 10*3/uL (ref 0.7–4.0)
MCH: 30.4 pg (ref 26.0–34.0)
MCHC: 35.1 g/dL (ref 30.0–36.0)
MCV: 86.6 fL (ref 78.0–100.0)
MONO ABS: 0.8 10*3/uL (ref 0.1–1.0)
Monocytes Relative: 8 % (ref 3–12)
NEUTROS PCT: 58 % (ref 43–77)
Neutro Abs: 5.7 10*3/uL (ref 1.7–7.7)
Platelets: 297 10*3/uL (ref 150–400)
RBC: 4.54 MIL/uL (ref 3.87–5.11)
RDW: 14.7 % (ref 11.5–15.5)
WBC: 9.8 10*3/uL (ref 4.0–10.5)

## 2013-08-31 LAB — HEMOGLOBIN A1C
Hgb A1c MFr Bld: 9 % — ABNORMAL HIGH (ref <5.7)
Mean Plasma Glucose: 212 mg/dL — ABNORMAL HIGH (ref <117)

## 2013-08-31 MED ORDER — BUTALBITAL-APAP-CAFFEINE 50-325-40 MG PO TABS: ORAL_TABLET | ORAL | Status: DC

## 2013-08-31 NOTE — Patient Instructions (Signed)

## 2013-08-31 NOTE — Progress Notes (Signed)
Patient ID: Tracy Garrett, female   DOB: 02/03/1956, 58 y.o.   MRN: [id number]   Annual Screening Comprehensive Examination  This very nice 58 y.o. MBF presents for complete physical.  Patient has been followed for HTN, T2 NIDDM, Hyperlipidemia, and Vitamin D Deficiency.    HTN predates since 1998 and patient had been lost to f/u about 18 months til presenting today. Apparently she has been of of Bp meds w/o monitoring  Since then. Today's BP: 128/84 mmHg. Patient denies any cardiac symptoms as chest pain, palpitations, shortness of breath, dizziness or ankle swelling.   Patient's hyperlipidemia was not controlled with diet.  Last cholesterol last visit was 244, triglycerides 207, HDL 50 and LDL 153  In Nov 2012.     Patient has T2 NIDDM  With A1c 6.8%  predating since Feb 2008 and attempted management with diet. Patient denies reactive hypoglycemic symptoms, visual blurring, diabetic polys, or paresthesias.    Finally, patient has history of Vitamin D Deficiency of 16 in 2008 andwith last vitamin D was 60 inNov 2012 but she has not been supplementing vit D.  Medication Sig  . cyclobenzaprine (FLEXERIL) 10 MG tablet Take 1 tablet by mouth 3  times daily as needed for muscle spasms.  . nabumetone (RELAFEN) 500 MG tablet Take 2 tablets  by mouth 2 times daily as needed.   No Known Allergies  Past Medical History  Diagnosis Date  . Diabetes mellitus without complication   . Allergy   . Type II or unspecified type diabetes mellitus without mention of complication, not stated as uncontrolled   . Hyperlipidemia     Past Surgical History  Procedure Laterality Date   Vag Hys for Fibroids  2002   Lap Chole  2003  . Breast surgery- reduction mammoplasty  1999    Family History  Problem Relation Age of Onset  . Cancer Mother     History  Substance Use Topics  . Smoking status: Never Smoker   . Smokeless tobacco: Not on file  . Alcohol Use: No    ROS Constitutional: Denies  fever, chills, weight loss/gain, headaches, insomnia, fatigue, night sweats, and change in appetite. Eyes: Denies redness, blurred vision, diplopia, discharge, itchy, watery eyes.  ENT: Denies discharge, congestion, post nasal drip, epistaxis, sore throat, earache, hearing loss, dental pain, Tinnitus, Vertigo, Sinus pain, snoring.  Cardio: Denies chest pain, palpitations, irregular heartbeat, syncope, dyspnea, diaphoresis, orthopnea, PND, claudication, edema Respiratory: denies cough, dyspnea, DOE, pleurisy, hoarseness, laryngitis, wheezing.  Gastrointestinal: Denies dysphagia, heartburn, reflux, water brash, pain, cramps, nausea, vomiting, bloating, diarrhea, constipation, hematemesis, melena, hematochezia, jaundice, hemorrhoids Genitourinary: Denies dysuria, frequency, urgency, nocturia, hesitancy, discharge, hematuria, flank pain Breast:Breast lumps, nipple discharge, bleeding.  Musculoskeletal: Denies arthralgia, myalgia, stiffness, Jt. Swelling, pain, limp, and strain/sprain. Skin: Denies puritis, rash, hives, warts, acne, eczema, changing in skin lesion Neuro: No weakness, tremor, incoordination, spasms, paresthesia, pain Psychiatric: Denies confusion, memory loss, sensory loss Endocrine: Denies change in weight, skin, hair change, nocturia, and paresthesia, diabetic polys, visual blurring, hyper / hypo glycemic episodes.  Heme/Lymph: No excessive bleeding, bruising, enlarged lymph nodes.   Physical Exam    BP 128/84  Pulse 64  Temp 98.2 F   Resp 16  Ht 5' 2.5"   Wt 223 lb 9.6 oz   BMI 40.22 kg/m2  General Appearance: Well nourished, in no apparent distress. Eyes: PERRLA, EOMs, conjunctiva no swelling or erythema, normal fundi and vessels. Sinuses: No frontal/maxillary tenderness ENT/Mouth: EACs patent / TMs  nl.  Nares clear without erythema, swelling, mucoid exudates. Oral hygiene is good. No erythema, swelling, or exudate. Tongue normal, non-obstructing. Tonsils not swollen or  erythematous. Hearing normal.  Neck: Supple, thyroid normal. No bruits, nodes or JVD. Respiratory: Respiratory effort normal.  BS equal and clear bilateral without rales, rhonci, wheezing or stridor. Cardio: Heart sounds are normal with regular rate and rhythm and no murmurs, rubs or gallops. Peripheral pulses are normal and equal bilaterally without edema. No aortic or femoral bruits. Chest: symmetric with normal excursions and percussion. Breasts: Symmetric, without lumps, nipple discharge, retractions, or fibrocystic changes.  Abdomen: Flat, soft, with bowl sounds. Nontender, no guarding, rebound, hernias, masses, or organomegaly.  Lymphatics: Non tender without lymphadenopathy.  Genitourinary:  Musculoskeletal: Full ROM all peripheral extremities, joint stability, 5/5 strength, and normal gait. Skin: Warm and dry without rashes, lesions, cyanosis, clubbing or  ecchymosis.  Neuro: Cranial nerves intact, reflexes equal bilaterally. Normal muscle tone, no cerebellar symptoms. Sensation intact.  Pysch: Awake and oriented X 3, normal affect, Insight and Judgment appropriate.   Assessment and Plan  1. Annual Screening Examination 2. Hypertension  3. Hyperlipidemia 4. T2 NIDDM 5. Vitamin D Deficiency 6. Obesity (BMI 40.22)  Continue prudent diet as discussed, weight control, BP monitoring, regular exercise, and medications. Discussed med's effects and SE's. Screening labs and tests as requested with regular follow-up as recommended.

## 2013-09-01 LAB — TSH: TSH: 1.682 u[IU]/mL (ref 0.350–4.500)

## 2013-09-01 LAB — MICROALBUMIN / CREATININE URINE RATIO
CREATININE, URINE: 182.8 mg/dL
MICROALB UR: 16.62 mg/dL — AB (ref 0.00–1.89)
Microalb Creat Ratio: 90.9 mg/g — ABNORMAL HIGH (ref 0.0–30.0)

## 2013-09-01 LAB — BASIC METABOLIC PANEL WITH GFR
BUN: 14 mg/dL (ref 6–23)
CO2: 24 mEq/L (ref 19–32)
Calcium: 9.3 mg/dL (ref 8.4–10.5)
Chloride: 98 mEq/L (ref 96–112)
Creat: 0.81 mg/dL (ref 0.50–1.10)
GFR, EST NON AFRICAN AMERICAN: 81 mL/min
Glucose, Bld: 183 mg/dL — ABNORMAL HIGH (ref 70–99)
Potassium: 4.5 mEq/L (ref 3.5–5.3)
Sodium: 137 mEq/L (ref 135–145)

## 2013-09-01 LAB — HEPATITIS B CORE ANTIBODY, TOTAL: HEP B C TOTAL AB: NONREACTIVE

## 2013-09-01 LAB — INSULIN, FASTING: INSULIN FASTING, SERUM: 37 u[IU]/mL — AB (ref 3–28)

## 2013-09-01 LAB — HIV ANTIBODY (ROUTINE TESTING W REFLEX): HIV: NONREACTIVE

## 2013-09-01 LAB — HEPATIC FUNCTION PANEL
ALBUMIN: 4.1 g/dL (ref 3.5–5.2)
ALT: 31 U/L (ref 0–35)
AST: 24 U/L (ref 0–37)
Alkaline Phosphatase: 82 U/L (ref 39–117)
BILIRUBIN INDIRECT: 0.4 mg/dL (ref 0.2–1.2)
BILIRUBIN TOTAL: 0.5 mg/dL (ref 0.2–1.2)
Bilirubin, Direct: 0.1 mg/dL (ref 0.0–0.3)
TOTAL PROTEIN: 7.3 g/dL (ref 6.0–8.3)

## 2013-09-01 LAB — HEPATITIS B SURFACE ANTIBODY,QUALITATIVE: Hep B S Ab: NEGATIVE

## 2013-09-01 LAB — LIPID PANEL
CHOLESTEROL: 250 mg/dL — AB (ref 0–200)
HDL: 51 mg/dL (ref >39)
LDL Cholesterol: 169 mg/dL — ABNORMAL HIGH (ref 0–99)
TRIGLYCERIDES: 151 mg/dL — AB (ref <150)
Total CHOL/HDL Ratio: 4.9 Ratio
VLDL: 30 mg/dL (ref 0–40)

## 2013-09-01 LAB — HEPATITIS A ANTIBODY, TOTAL: HEP A TOTAL AB: NONREACTIVE

## 2013-09-01 LAB — VITAMIN B12: VITAMIN B 12: 452 pg/mL (ref 211–911)

## 2013-09-01 LAB — MAGNESIUM: Magnesium: 1.9 mg/dL (ref 1.5–2.5)

## 2013-09-01 LAB — VITAMIN D 25 HYDROXY (VIT D DEFICIENCY, FRACTURES): Vit D, 25-Hydroxy: 75 ng/mL (ref 30–89)

## 2013-09-01 LAB — HEPATITIS C ANTIBODY: HCV AB: NEGATIVE

## 2013-09-02 ENCOUNTER — Other Ambulatory Visit: Payer: Self-pay | Admitting: Internal Medicine

## 2013-09-02 MED ORDER — ATORVASTATIN CALCIUM 80 MG PO TABS: ORAL_TABLET | ORAL | Status: DC

## 2013-09-02 MED ORDER — METFORMIN HCL ER 500 MG PO TB24: ORAL_TABLET | ORAL | Status: DC

## 2013-09-03 LAB — HEPATITIS B E ANTIBODY: Hepatitis Be Antibody: NONREACTIVE

## 2013-09-05 LAB — TB SKIN TEST
INDURATION: 0 mm
TB SKIN TEST: NEGATIVE

## 2013-10-28 ENCOUNTER — Ambulatory Visit: Payer: 59

## 2013-10-28 ENCOUNTER — Ambulatory Visit (INDEPENDENT_AMBULATORY_CARE_PROVIDER_SITE_OTHER): Payer: 59 | Admitting: Emergency Medicine

## 2013-10-28 VITALS — BP 120/86 | HR 73 | Temp 98.5°F | Ht 62.75 in | Wt 223.8 lb

## 2013-10-28 DIAGNOSIS — R109 Unspecified abdominal pain: Secondary | ICD-10-CM

## 2013-10-28 DIAGNOSIS — M549 Dorsalgia, unspecified: Secondary | ICD-10-CM

## 2013-10-28 DIAGNOSIS — R739 Hyperglycemia, unspecified: Secondary | ICD-10-CM

## 2013-10-28 DIAGNOSIS — R7309 Other abnormal glucose: Secondary | ICD-10-CM

## 2013-10-28 DIAGNOSIS — R10A1 Flank pain, right side: Secondary | ICD-10-CM

## 2013-10-28 LAB — POCT URINALYSIS DIPSTICK
BILIRUBIN UA: NEGATIVE
Blood, UA: NEGATIVE
Glucose, UA: NEGATIVE
KETONES UA: NEGATIVE
LEUKOCYTES UA: NEGATIVE
Nitrite, UA: NEGATIVE
PROTEIN UA: 100
SPEC GRAV UA: 1.025
Urobilinogen, UA: 0.2
pH, UA: 5.5

## 2013-10-28 MED ORDER — MELOXICAM 7.5 MG PO TABS: ORAL_TABLET | ORAL | Status: DC

## 2013-10-28 MED ORDER — CYCLOBENZAPRINE HCL 10 MG PO TABS: ORAL_TABLET | ORAL | Status: DC

## 2013-10-28 NOTE — Patient Instructions (Signed)
Back Pain, Adult Low back pain is very common. About 1 in 5 people have back pain.The cause of low back pain is rarely dangerous. The pain often gets better over time.About half of people with a sudden onset of back pain feel better in just 2 weeks. About 8 in 10 people feel better by 6 weeks.  CAUSES Some common causes of back pain include:  Strain of the muscles or ligaments supporting the spine.  Wear and tear (degeneration) of the spinal discs.  Arthritis.  Direct injury to the back. DIAGNOSIS Most of the time, the direct cause of low back pain is not known.However, back pain can be treated effectively even when the exact cause of the pain is unknown.Answering your caregiver's questions about your overall health and symptoms is one of the most accurate ways to make sure the cause of your pain is not dangerous. If your caregiver needs more information, he or she may order lab work or imaging tests (X-rays or MRIs).However, even if imaging tests show changes in your back, this usually does not require surgery. HOME CARE INSTRUCTIONS For many people, back pain returns.Since low back pain is rarely dangerous, it is often a condition that people can learn to manageon their own.   Remain active. It is stressful on the back to sit or stand in one place. Do not sit, drive, or stand in one place for more than 30 minutes at a time. Take short walks on level surfaces as soon as pain allows.Try to increase the length of time you walk each day.  Do not stay in bed.Resting more than 1 or 2 days can delay your recovery.  Do not avoid exercise or work.Your body is made to move.It is not dangerous to be active, even though your back may hurt.Your back will likely heal faster if you return to being active before your pain is gone.  Pay attention to your body when you bend and lift. Many people have less discomfortwhen lifting if they bend their knees, keep the load close to their bodies,and  avoid twisting. Often, the most comfortable positions are those that put less stress on your recovering back.  Find a comfortable position to sleep. Use a firm mattress and lie on your side with your knees slightly bent. If you lie on your back, put a pillow under your knees.  Only take over-the-counter or prescription medicines as directed by your caregiver. Over-the-counter medicines to reduce pain and inflammation are often the most helpful.Your caregiver may prescribe muscle relaxant drugs.These medicines help dull your pain so you can more quickly return to your normal activities and healthy exercise.  Put ice on the injured area.  Put ice in a plastic bag.  Place a towel between your skin and the bag.  Leave the ice on for 15-20 minutes, 03-04 times a day for the first 2 to 3 days. After that, ice and heat may be alternated to reduce pain and spasms.  Ask your caregiver about trying back exercises and gentle massage. This may be of some benefit.  Avoid feeling anxious or stressed.Stress increases muscle tension and can worsen back pain.It is important to recognize when you are anxious or stressed and learn ways to manage it.Exercise is a great option. SEEK MEDICAL CARE IF:  You have pain that is not relieved with rest or medicine.  You have pain that does not improve in 1 week.  You have new symptoms.  You are generally not feeling well. SEEK   IMMEDIATE MEDICAL CARE IF:   You have pain that radiates from your back into your legs.  You develop new bowel or bladder control problems.  You have unusual weakness or numbness in your arms or legs.  You develop nausea or vomiting.  You develop abdominal pain.  You feel faint. Document Released: 05/24/2005 Document Revised: 11/23/2011 Document Reviewed: 10/12/2010 [employer] Patient Information 2014 [employer].  

## 2013-10-28 NOTE — Progress Notes (Signed)
   Subjective:    Patient ID: Tracy Garrett, female    DOB: May 07, 1956, 58 y.o.   MRN: [id number]  HPI 58 year old female pt present for right side flank pain. She had it about 2-3 years ago off and on, but for the last 2 weeks it has been consistent. It does not hurt when she turns or twists. Feels like a burning pain. No nausea or vomiting. Hx of kidney stones. No urinary symptoms. Does not really go into abdomen; pain stays more towards flank side. Has had cholecystectomy about 10 years ago.    Review of Systems     Objective:   Physical Exam patient is alert and cooperative not ill-appearing. Her neck is supple. Chest was clear. Heart regular rate no murmurs. Abdomen is obese without masses. There is no CVA tenderness [facility] reading (PRIMARY) by  Dr.Eleanore Junio chest x-ray and KUB are unremarkable no acute abnormalities are seen. Results for orders placed in visit on 10/28/13  POCT URINALYSIS DIPSTICK      Result Value Ref Range   Color, UA yellow     Clarity, UA clear     Glucose, UA neg     Bilirubin, UA neg     Ketones, UA neg     Spec Grav, UA 1.025     Blood, UA neg     pH, UA 5.5     Protein, UA 100     Urobilinogen, UA 0.2     Nitrite, UA neg     Leukocytes, UA Negative           Assessment & Plan:  I suspect his pain is musculoskeletal. We'll treat with muscle relaxants along with Mobic 7.5 one to 2 a day. If she has persistent pain she may need further imaging of the right flank and abdominal area.

## 2013-11-01 ENCOUNTER — Ambulatory Visit (HOSPITAL_BASED_OUTPATIENT_CLINIC_OR_DEPARTMENT_OTHER): Payer: 59

## 2013-11-21 ENCOUNTER — Other Ambulatory Visit: Payer: Self-pay | Admitting: Internal Medicine

## 2013-11-22 ENCOUNTER — Other Ambulatory Visit: Payer: Self-pay | Admitting: Internal Medicine

## 2013-11-22 ENCOUNTER — Encounter: Payer: Self-pay | Admitting: Emergency Medicine

## 2013-11-22 ENCOUNTER — Other Ambulatory Visit: Payer: Self-pay

## 2013-11-22 ENCOUNTER — Ambulatory Visit (INDEPENDENT_AMBULATORY_CARE_PROVIDER_SITE_OTHER): Payer: 59 | Admitting: Emergency Medicine

## 2013-11-22 VITALS — BP 122/80 | HR 84 | Temp 98.6°F | Resp 18 | Ht 62.75 in | Wt 223.0 lb

## 2013-11-22 DIAGNOSIS — R1011 Right upper quadrant pain: Secondary | ICD-10-CM

## 2013-11-22 LAB — BASIC METABOLIC PANEL WITH GFR
BUN: 14 mg/dL (ref 6–23)
CO2: 27 mEq/L (ref 19–32)
Calcium: 9.1 mg/dL (ref 8.4–10.5)
Chloride: 103 mEq/L (ref 96–112)
Creat: 0.81 mg/dL (ref 0.50–1.10)
GFR, EST NON AFRICAN AMERICAN: 81 mL/min
GFR, Est African American: >89 mL/min
Glucose, Bld: 153 mg/dL — ABNORMAL HIGH (ref 70–99)
POTASSIUM: 4.2 meq/L (ref 3.5–5.3)
SODIUM: 140 meq/L (ref 135–145)

## 2013-11-22 LAB — CBC WITH DIFFERENTIAL/PLATELET
Basophils Absolute: 0 10*3/uL (ref 0.0–0.1)
Basophils Relative: 0 % (ref 0–1)
Eosinophils Absolute: 0.3 10*3/uL (ref 0.0–0.7)
Eosinophils Relative: 3 % (ref 0–5)
HCT: 38.8 % (ref 36.0–46.0)
Hemoglobin: 13.4 g/dL (ref 12.0–15.0)
LYMPHS ABS: 3.1 10*3/uL (ref 0.7–4.0)
LYMPHS PCT: 32 % (ref 12–46)
MCH: 29.7 pg (ref 26.0–34.0)
MCHC: 34.5 g/dL (ref 30.0–36.0)
MCV: 86 fL (ref 78.0–100.0)
MONO ABS: 0.8 10*3/uL (ref 0.1–1.0)
MONOS PCT: 8 % (ref 3–12)
NEUTROS ABS: 5.5 10*3/uL (ref 1.7–7.7)
Neutrophils Relative %: 57 % (ref 43–77)
Platelets: 297 10*3/uL (ref 150–400)
RBC: 4.51 MIL/uL (ref 3.87–5.11)
RDW: 15.2 % (ref 11.5–15.5)
WBC: 9.6 10*3/uL (ref 4.0–10.5)

## 2013-11-22 LAB — HEPATIC FUNCTION PANEL
ALBUMIN: 4.2 g/dL (ref 3.5–5.2)
ALK PHOS: 83 U/L (ref 39–117)
ALT: 31 U/L (ref 0–35)
AST: 30 U/L (ref 0–37)
BILIRUBIN INDIRECT: 0.5 mg/dL (ref 0.2–1.2)
Bilirubin, Direct: 0.1 mg/dL (ref 0.0–0.3)
TOTAL PROTEIN: 7.3 g/dL (ref 6.0–8.3)
Total Bilirubin: 0.6 mg/dL (ref 0.2–1.2)

## 2013-11-22 NOTE — Progress Notes (Signed)
   Subjective:    Patient ID: Tracy Garrett, female    DOB: 1955/06/29, 58 y.o.   MRN: [id number]  HPI Comments: 58 yo AAF with increased RUQ from on/off to daily since May. Right side pain radiates from stomach in to back. She has had right kidney stone removed in 2011 and also fatty liver on CT. She has had Choley in past but uncertain if any stones. She notes mild nausea. She has history of GERD but has been off RX for several months. She had EGD/Colon by Dr Collene Mares and thinks she had nodule in stomach but did not have f/u.   Flank Pain     Medication List       This list is accurate as of: 11/22/13 10:17 AM.  Always use your most recent med list.               meloxicam 7.5 MG tablet  Commonly known as:  MOBIC  Take 1-2 tablets daily with food.        No Known Allergies  Past Medical History  Diagnosis Date  . Diabetes mellitus without complication   . Allergy   . Type II or unspecified type diabetes mellitus without mention of complication, not stated as uncontrolled   . Hyperlipidemia        Review of Systems  Genitourinary: Positive for flank pain.  BP 122/80  Pulse 84  Temp(Src) 98.6 F (37 C) (Temporal)  Resp 18  Ht 5' 2.75" (1.594 m)  Wt 223 lb (101.152 kg)  BMI 39.81 kg/m2      Objective:   Physical Exam  Nursing note and vitals reviewed. Constitutional: She is oriented to person, place, and time. She appears well-developed and well-nourished.  HENT:  Head: Normocephalic and atraumatic.  Eyes: Conjunctivae are normal.  Neck: Normal range of motion.  Cardiovascular: Normal rate, regular rhythm, normal heart sounds and intact distal pulses.   Pulmonary/Chest: Effort normal and breath sounds normal.  Abdominal: Soft. Bowel sounds are normal. She exhibits no distension and no mass. There is tenderness. There is no rebound and no guarding.  Right flank/ RUQ minimal  Musculoskeletal: Normal range of motion.  Neurological: She is alert and  oriented to person, place, and time.  Skin: Skin is warm and dry.  Psychiatric: She has a normal mood and affect. Judgment normal.          Assessment & Plan:  1. RUQ- If all labs and ct NEG needs GI evaluation ? Abnormal EGD in past. GERD- Diet/ hygiene discussed, May try OTC Nexium and call with results  w/c if SX increase or ER.

## 2013-11-22 NOTE — Patient Instructions (Signed)

## 2013-11-23 ENCOUNTER — Ambulatory Visit
Admission: RE | Admit: 2013-11-23 | Discharge: 2013-11-23 | Disposition: A | Discharge status: Home / Self Care | Payer: 59 | Source: Ambulatory Visit | Attending: Emergency Medicine | Admitting: Emergency Medicine

## 2013-11-23 ENCOUNTER — Other Ambulatory Visit: Payer: 59

## 2013-11-23 DIAGNOSIS — R1011 Right upper quadrant pain: Secondary | ICD-10-CM

## 2013-11-23 LAB — URINALYSIS, MICROSCOPIC ONLY
Bacteria, UA: NONE SEEN
Casts: NONE SEEN
Crystals: NONE SEEN

## 2013-11-23 LAB — URINE CULTURE
Colony Count: NO GROWTH
ORGANISM ID, BACTERIA: NO GROWTH

## 2013-11-23 LAB — URINALYSIS, ROUTINE W REFLEX MICROSCOPIC
Bilirubin Urine: NEGATIVE
Glucose, UA: NEGATIVE mg/dL
HGB URINE DIPSTICK: NEGATIVE
Ketones, ur: NEGATIVE mg/dL
LEUKOCYTES UA: NEGATIVE
NITRITE: NEGATIVE
PH: 6 (ref 5.0–8.0)
Protein, ur: 30 mg/dL — AB
Specific Gravity, Urine: 1.018 (ref 1.005–1.030)
Urobilinogen, UA: 0.2 mg/dL (ref 0.0–1.0)

## 2013-11-25 ENCOUNTER — Other Ambulatory Visit: Payer: Self-pay | Admitting: Emergency Medicine

## 2013-11-25 DIAGNOSIS — R1011 Right upper quadrant pain: Secondary | ICD-10-CM

## 2013-12-06 ENCOUNTER — Ambulatory Visit: Payer: Self-pay | Admitting: Physician Assistant

## 2013-12-11 ENCOUNTER — Other Ambulatory Visit: Payer: Self-pay | Admitting: Emergency Medicine

## 2013-12-25 ENCOUNTER — Ambulatory Visit: Payer: Self-pay | Admitting: Physician Assistant

## 2014-01-06 ENCOUNTER — Encounter: Payer: Self-pay | Admitting: Physician Assistant

## 2014-01-08 ENCOUNTER — Ambulatory Visit: Payer: Self-pay | Admitting: Physician Assistant

## 2014-01-29 ENCOUNTER — Encounter: Payer: Self-pay | Admitting: Internal Medicine

## 2014-02-25 ENCOUNTER — Ambulatory Visit (HOSPITAL_BASED_OUTPATIENT_CLINIC_OR_DEPARTMENT_OTHER): Payer: 59 | Attending: Internal Medicine

## 2014-02-25 DIAGNOSIS — G4733 Obstructive sleep apnea (adult) (pediatric): Secondary | ICD-10-CM | POA: Insufficient documentation

## 2014-03-03 DIAGNOSIS — G4733 Obstructive sleep apnea (adult) (pediatric): Secondary | ICD-10-CM

## 2014-03-03 NOTE — Sleep Study (Signed)
   NAME: Tracy Garrett DATE OF BIRTH:  13-Jun-1955 MEDICAL RECORD NUMBER [id number]  LOCATION: [facility]  PHYSICIAN: Eltha Tingley D  DATE OF STUDY: 02/25/2014  SLEEP STUDY TYPE: Nocturnal Polysomnogram               REFERRING PHYSICIAN: Unk Pinto, MD  INDICATION FOR STUDY: Insomnia with sleep apnea  EPWORTH SLEEPINESS SCORE:   14/24 HEIGHT:   5 feet 3 inches WEIGHT:   214 pounds  BMI 38 NECK SIZE: 15.5 in.  MEDICATIONS: Charted for review  SLEEP ARCHITECTURE: Total sleep time 309 minutes with sleep efficiency 83.4%. Stage I was 10%, stage II 67.3%, stage III absent, REM 22.7% of total sleep time. Sleep latency 16.5 minutes, REM latency 69.5 minutes, awake after sleep onset 46.5 minutes, arousal index 14, bedtime medication: None  RESPIRATORY DATA: Apnea hypopneas index (AHI) 9.3 per hour. 48 total events scored including 8 obstructive apneas and 40 hypopneas. All events were while nonsupine. REM AHI 36 per hour. CPAP titration was not done  OXYGEN DATA: Moderate to very loud snoring with oxygen desaturation to a nadir of 78% and mean saturation 93.9% on room air.  CARDIAC DATA: Sinus rhythm with occasional PVC  MOVEMENT/PARASOMNIA: No significant motor disturbance, bathroom x1  IMPRESSION/ RECOMMENDATION:   1) Mild obstructive sleep apnea/hypopneas syndrome, AHI 9.3 per hour. Events were associated with supine sleep position and with REM. REM AHI 9.3 per hour. Moderate to sometimes very loud snoring with oxygen desaturation to a nadir of 78% and mean saturation 93.9% on room air. 2) Scores in this range might respond to conservative therapy including weight loss. On an individual basis, and oral appliance or CPAP may be appropriate. This patient can return for a dedicated CPAP titration study if desired.   Tracy Garrett Diplomate, American Board of Sleep Medicine  ELECTRONICALLY SIGNED ON:  03/03/2014, 2:04 PM [place] PH: [phone]   FX: [phone] [place]

## 2014-03-04 ENCOUNTER — Telehealth: Payer: Self-pay

## 2014-03-04 NOTE — Telephone Encounter (Signed)
LMOM with results of sleep study, advised patient via message of result, (+ mild OSA) , conservative therapy includes , weight loss, oral appliance, or return for CPAP titration study, advised patient to call office if she would like to follow through at this time or wait until next follow up in November to discuss

## 2014-03-08 ENCOUNTER — Ambulatory Visit: Payer: Self-pay | Admitting: Internal Medicine

## 2014-04-09 ENCOUNTER — Ambulatory Visit: Payer: Self-pay | Admitting: Internal Medicine

## 2014-04-28 ENCOUNTER — Encounter: Payer: Self-pay | Admitting: *Deleted

## 2014-05-21 ENCOUNTER — Other Ambulatory Visit: Payer: Self-pay | Admitting: Gastroenterology

## 2014-05-21 DIAGNOSIS — R1011 Right upper quadrant pain: Secondary | ICD-10-CM

## 2014-05-27 ENCOUNTER — Other Ambulatory Visit: Payer: 59

## 2014-05-27 ENCOUNTER — Ambulatory Visit
Admission: RE | Admit: 2014-05-27 | Discharge: 2014-05-27 | Disposition: A | Discharge status: Home / Self Care | Payer: 59 | Source: Ambulatory Visit | Attending: Gastroenterology | Admitting: Gastroenterology

## 2014-05-27 DIAGNOSIS — R1011 Right upper quadrant pain: Secondary | ICD-10-CM

## 2014-05-27 MED ORDER — IOHEXOL 300 MG/ML  SOLN
125.0000 mL | Freq: Once | INTRAMUSCULAR | Status: AC | PRN
  Administered 2014-05-27: 125 mL via INTRAVENOUS

## 2014-06-01 ENCOUNTER — Emergency Department (HOSPITAL_COMMUNITY)
Admission: EM | Admit: 2014-06-01 | Discharge: 2014-06-01 | Disposition: A | Discharge status: Home / Self Care | Payer: 59 | Attending: Emergency Medicine | Admitting: Emergency Medicine

## 2014-06-01 ENCOUNTER — Encounter (HOSPITAL_COMMUNITY): Payer: Self-pay | Admitting: Emergency Medicine

## 2014-06-01 DIAGNOSIS — R221 Localized swelling, mass and lump, neck: Secondary | ICD-10-CM | POA: Diagnosis present

## 2014-06-01 DIAGNOSIS — E119 Type 2 diabetes mellitus without complications: Secondary | ICD-10-CM | POA: Diagnosis not present

## 2014-06-01 DIAGNOSIS — Z7952 Long term (current) use of systemic steroids: Secondary | ICD-10-CM | POA: Diagnosis not present

## 2014-06-01 DIAGNOSIS — Z79899 Other long term (current) drug therapy: Secondary | ICD-10-CM | POA: Diagnosis not present

## 2014-06-01 DIAGNOSIS — L309 Dermatitis, unspecified: Secondary | ICD-10-CM | POA: Diagnosis not present

## 2014-06-01 MED ORDER — MUPIROCIN CALCIUM 2 % EX CREA: 1.0000 "application " | TOPICAL_CREAM | Freq: Two times a day (BID) | CUTANEOUS | Status: DC

## 2014-06-01 MED ORDER — DIPHENHYDRAMINE HCL 25 MG PO CAPS
25.0000 mg | ORAL_CAPSULE | Freq: Once | ORAL | Status: AC
  Administered 2014-06-01: 25 mg via ORAL
  Filled 2014-06-01: qty 1

## 2014-06-01 MED ORDER — DEXAMETHASONE 4 MG PO TABS: 4.0000 mg | ORAL_TABLET | Freq: Two times a day (BID) | ORAL | Status: DC

## 2014-06-01 MED ORDER — DEXAMETHASONE 4 MG PO TABS
8.0000 mg | ORAL_TABLET | Freq: Once | ORAL | Status: AC
Start: 2014-06-01 — End: 2014-06-01
  Administered 2014-06-01: 8 mg via ORAL
  Filled 2014-06-01: qty 2

## 2014-06-01 NOTE — ED Notes (Addendum)
Pt states that she had CT of abd done couple days ago and ever since has felt like her lips have been swollen.  Pt states that just a little bit ago she started having tingling and itching around her lips. Pt does have reddness around the upper lip. Pt denies any trouble breathing or any oral swelling/irritation.

## 2014-06-01 NOTE — ED Notes (Signed)
MD Kohut at bedside. 

## 2014-06-03 ENCOUNTER — Other Ambulatory Visit: Payer: Self-pay | Admitting: Gastroenterology

## 2014-06-08 NOTE — ED Provider Notes (Signed)
CSN: [id number]     Arrival date & time 06/01/14  1259 History   First MD Initiated Contact with Patient 06/01/14 1459     Chief Complaint  Patient presents with  . Facial Swelling  . facial itching     (Consider location/radiation/quality/duration/timing/severity/associated sxs/prior Treatment) HPI   59 year old female with rash to upper lip. It itches and burns. Patient CT of the abdomen and pelvis done with contrast couple days ago. Symptom onset around that time. She feels like she may be having allergic reaction. No other complaints. No difficulty breathing or swallowing. No rash elsewhere. No abdominal pain, vomiting or diarrhea. No nausea. No dizziness or lightheadedness. Denies any wheezing. No shortness of breath.  Past Medical History  Diagnosis Date  . Diabetes mellitus without complication   . Allergy   . Type II or unspecified type diabetes mellitus without mention of complication, not stated as uncontrolled   . Hyperlipidemia    Past Surgical History  Procedure Laterality Date  . Cholecystectomy  2003  . Breast surgery  1999    Reduction Mammoplasties  . Colonoscopsy  11/2007    neg  . Vaginal hysterectomy  2002   Family History  Problem Relation Age of Onset  . Cancer Mother    History  Substance Use Topics  . Smoking status: Never Smoker   . Smokeless tobacco: Not on file  . Alcohol Use: No   OB History    No data available     Review of Systems  All systems reviewed and negative, other than as noted in HPI.   Allergies  Other  Home Medications   Prior to Admission medications   Medication Sig Start Date End Date Taking? Authorizing Provider  fexofenadine (ALLEGRA) 180 MG tablet Take 180 mg by mouth daily.   Yes Historical Provider, MD  hydrocortisone cream 1 % Apply 1 application topically 3 (three) times daily as needed for itching.   Yes Historical Provider, MD  ibuprofen (ADVIL,MOTRIN) 200 MG tablet Take 400 mg by mouth every 6 (six)  hours as needed for moderate pain.   Yes Historical Provider, MD  naftifine (NAFTIN) 1 % cream Apply 1 application topically 2 (two) times daily as needed. Pt states used for a rash   Yes Historical Provider, MD  Cholecalciferol (VITAMIN D3) 5000 UNITS TABS Take 1 tablet by mouth daily.    Historical Provider, MD  dexamethasone (DECADRON) 4 MG tablet Take 1 tablet (4 mg total) by mouth 2 (two) times daily with a meal. 06/01/14   Virgel Manifold, MD  diphenhydrAMINE (BENADRYL) 25 mg capsule Take 25 mg by mouth every 6 (six) hours as needed for itching.    Historical Provider, MD  meloxicam (MOBIC) 7.5 MG tablet Take 1-2 tablets daily with food. Patient not taking: Reported on 06/01/2014 10/28/13   Darlyne Russian, MD  mupirocin cream (BACTROBAN) 2 % Apply 1 application topically 2 (two) times daily. 06/01/14   Virgel Manifold, MD  OVER THE COUNTER MEDICATION Take 1 tablet by mouth daily.    Historical Provider, MD   BP 140/97 mmHg  Pulse 79  Temp(Src) 97.8 F (36.6 C) (Oral)  Resp 14  SpO2 96% Physical Exam  Constitutional: She appears well-developed and well-nourished. No distress.  HENT:  Head: Normocephalic and atraumatic.  Erythematous/chapped appearance to the upper lip. No appreciable swelling. No intraoral lesions noted. Tongue is normal in appearance. Posterior pharynx is clear. Uvula is midline. Normal stomach phonation. Submental tissues are soft. There is no  stridor. Neck is supple. No adenopathy.  Eyes: Conjunctivae are normal. Right eye exhibits no discharge. Left eye exhibits no discharge.  Neck: Neck supple.  Cardiovascular: Normal rate, regular rhythm and normal heart sounds.  Exam reveals no gallop and no friction rub.   No murmur heard. Pulmonary/Chest: Effort normal and breath sounds normal. No respiratory distress.  Abdominal: Soft. She exhibits no distension. There is no tenderness.  Musculoskeletal: She exhibits no edema or tenderness.  Neurological: She is alert.  Skin:  Skin is warm and dry.  No rash noted aside from her upper lip.  Psychiatric: She has a normal mood and affect. Her behavior is normal. Thought content normal.  Nursing note and vitals reviewed.   ED Course  Procedures (including critical care time) Labs Review Labs Reviewed - No data to display  Imaging Review No results found.   EKG Interpretation None      MDM   Final diagnoses:  Facial dermatitis    59 year old female with was consistent with a mild dermatitis to the upper lip. No intraoral lesions. Patient thinks may be related to recent CT scan. Not convinced that it is. Regardless, no signs of anaphylaxis. I feel she is stable for discharge at this time.  Virgel Manifold, MD 06/08/14 [phone]

## 2014-06-10 ENCOUNTER — Encounter (HOSPITAL_COMMUNITY): Payer: Self-pay | Admitting: *Deleted

## 2014-06-26 ENCOUNTER — Encounter (HOSPITAL_COMMUNITY): Payer: Self-pay | Admitting: *Deleted

## 2014-06-26 NOTE — Progress Notes (Signed)
06-26-14 1015 may need to reschedule again, "FMLA" paperwork remains pending.

## 2014-07-04 ENCOUNTER — Other Ambulatory Visit: Payer: Self-pay | Admitting: Gastroenterology

## 2014-07-05 ENCOUNTER — Ambulatory Visit (HOSPITAL_COMMUNITY): Admission: RE | Admit: 2014-07-05 | Payer: 59 | Source: Ambulatory Visit | Admitting: Gastroenterology

## 2014-07-05 HISTORY — DX: Sleep apnea, unspecified: G47.30

## 2014-07-05 HISTORY — DX: Nausea with vomiting, unspecified: R11.2

## 2014-07-05 HISTORY — DX: Personal history of urinary calculi: Z87.442

## 2014-07-05 HISTORY — DX: Other specified postprocedural states: Z98.890

## 2014-07-05 SURGERY — UPPER ENDOSCOPIC ULTRASOUND (EUS) LINEAR
Anesthesia: Monitor Anesthesia Care

## 2014-07-19 ENCOUNTER — Encounter (HOSPITAL_COMMUNITY): Payer: Self-pay | Admitting: *Deleted

## 2014-07-19 NOTE — Progress Notes (Signed)
07-19-14 Pt rescheduled multiple times due to issues getting time off from work.

## 2014-07-26 ENCOUNTER — Encounter (HOSPITAL_COMMUNITY)
Admission: RE | Disposition: A | Discharge status: Home / Self Care | Payer: Self-pay | Source: Ambulatory Visit | Attending: Gastroenterology

## 2014-07-26 ENCOUNTER — Ambulatory Visit (HOSPITAL_COMMUNITY)
Admission: RE | Admit: 2014-07-26 | Discharge: 2014-07-26 | Disposition: A | Discharge status: Home / Self Care | Payer: 59 | Source: Ambulatory Visit | Attending: Gastroenterology | Admitting: Gastroenterology

## 2014-07-26 ENCOUNTER — Ambulatory Visit (HOSPITAL_COMMUNITY): Payer: 59 | Admitting: Anesthesiology

## 2014-07-26 ENCOUNTER — Encounter (HOSPITAL_COMMUNITY): Payer: Self-pay | Admitting: Certified Registered Nurse Anesthetist

## 2014-07-26 DIAGNOSIS — I10 Essential (primary) hypertension: Secondary | ICD-10-CM | POA: Diagnosis not present

## 2014-07-26 DIAGNOSIS — E119 Type 2 diabetes mellitus without complications: Secondary | ICD-10-CM | POA: Insufficient documentation

## 2014-07-26 DIAGNOSIS — D3A010 Benign carcinoid tumor of the duodenum: Secondary | ICD-10-CM | POA: Insufficient documentation

## 2014-07-26 DIAGNOSIS — G4730 Sleep apnea, unspecified: Secondary | ICD-10-CM | POA: Insufficient documentation

## 2014-07-26 DIAGNOSIS — Z6839 Body mass index (BMI) 39.0-39.9, adult: Secondary | ICD-10-CM | POA: Diagnosis not present

## 2014-07-26 HISTORY — PX: EUS: SHX5427

## 2014-07-26 LAB — GLUCOSE, CAPILLARY: Glucose-Capillary: 167 mg/dL — ABNORMAL HIGH (ref 70–99)

## 2014-07-26 SURGERY — UPPER ENDOSCOPIC ULTRASOUND (EUS) LINEAR
Anesthesia: Monitor Anesthesia Care

## 2014-07-26 MED ORDER — LIDOCAINE HCL (CARDIAC) 20 MG/ML IV SOLN
INTRAVENOUS | Status: AC
  Filled 2014-07-26: qty 5

## 2014-07-26 MED ORDER — ONDANSETRON HCL 4 MG/2ML IJ SOLN
INTRAMUSCULAR | Status: DC | PRN
  Administered 2014-07-26: 4 mg via INTRAVENOUS

## 2014-07-26 MED ORDER — LACTATED RINGERS IV SOLN
INTRAVENOUS | Status: DC
  Administered 2014-07-26: 1000 mL via INTRAVENOUS

## 2014-07-26 MED ORDER — LACTATED RINGERS IV SOLN
INTRAVENOUS | Status: DC | PRN
  Administered 2014-07-26: 12:00:00 via INTRAVENOUS

## 2014-07-26 MED ORDER — PROPOFOL INFUSION 10 MG/ML OPTIME
INTRAVENOUS | Status: DC | PRN
  Administered 2014-07-26: 300 ug/kg/min via INTRAVENOUS

## 2014-07-26 MED ORDER — ONDANSETRON HCL 4 MG/2ML IJ SOLN
INTRAMUSCULAR | Status: AC
  Filled 2014-07-26: qty 2

## 2014-07-26 MED ORDER — LIDOCAINE HCL (CARDIAC) 20 MG/ML IV SOLN
INTRAVENOUS | Status: DC | PRN
  Administered 2014-07-26: 100 mg via INTRAVENOUS

## 2014-07-26 MED ORDER — PROPOFOL 10 MG/ML IV BOLUS
INTRAVENOUS | Status: AC
  Filled 2014-07-26: qty 20

## 2014-07-26 MED ORDER — FENTANYL CITRATE 0.05 MG/ML IJ SOLN: 25.0000 ug | INTRAMUSCULAR | Status: DC | PRN

## 2014-07-26 MED ORDER — KETAMINE HCL 10 MG/ML IJ SOLN
INTRAMUSCULAR | Status: DC | PRN
  Administered 2014-07-26: 20 mg via INTRAVENOUS

## 2014-07-26 MED ORDER — BUTAMBEN-TETRACAINE-BENZOCAINE 2-2-14 % EX AERO
INHALATION_SPRAY | CUTANEOUS | Status: DC | PRN
  Administered 2014-07-26: 1 via TOPICAL

## 2014-07-26 MED ORDER — SODIUM CHLORIDE 0.9 % IV SOLN: INTRAVENOUS | Status: DC

## 2014-07-26 NOTE — Transfer of Care (Signed)
Immediate Anesthesia Transfer of Care Note  Patient: Tracy Garrett  Procedure(s) Performed: Procedure(s) (LRB): UPPER ENDOSCOPIC ULTRASOUND (EUS) LINEAR (N/A)  Patient Location: PACU  Anesthesia Type: MAC  Level of Consciousness: sedated, patient cooperative and responds to stimulation  Airway & Oxygen Therapy: Patient Spontanous Breathing and Patient connected to face mask oxgen  Post-op Assessment: Report given to PACU RN and Post -op Vital signs reviewed and stable  Post vital signs: Reviewed and stable  Complications: No apparent anesthesia complications

## 2014-07-26 NOTE — Interval H&P Note (Signed)
History and Physical Interval Note:  07/26/2014 11:47 AM  Tracy Garrett  has presented today for surgery, with the diagnosis of nodule in duondenal bulb  The various methods of treatment have been discussed with the patient and family. After consideration of risks, benefits and other options for treatment, the patient has consented to  Procedure(s): UPPER ENDOSCOPIC ULTRASOUND (EUS) LINEAR (N/A) as a surgical intervention .  The patient's history has been reviewed, patient examined, no change in status, stable for surgery.  I have reviewed the patient's chart and labs.  Questions were answered to the patient's satisfaction.     Moataz Tavis D

## 2014-07-26 NOTE — H&P (View-Only) (Signed)
07-19-14 Pt rescheduled multiple times due to issues getting time off from work.

## 2014-07-26 NOTE — Op Note (Signed)
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 PROCEDURE PERFORMED:     Upper Endoscopic Ultrasound without FNA   MEDICATIONS:     Monitored anesthesia care  CONSENT: The patient understands the risks and benefits of the procedure and understands that these risks include, but are not limited to: sedation, allergic reaction, infection, perforation and/or bleeding. Alternative means of evaluation and treatment include, among others: physical exam, x-rays, and/or surgical intervention. The patient elects to proceed with this endoscopic procedure.  DESCRIPTION OF PROCEDURE: During intra-op preparation period all mechanical & medical equipment was checked for proper function. Hand hygiene and appropriate measures for infection prevention was taken. After the risks, benefits and alternatives of the procedure were thoroughly explained, Informed consent was verified, confirmed and timeout was successfully executed by the treatment team. The patient was then placed in the left, lateral, decubitus position and IV sedation was administered. Throughout the procedure, the patients blood pressure, pulse and oxygen saturations were monitored continuously. Under direct visualization, the    was introduced through the mouth and advanced to the bulb of duodenum. Water was used as necessary to provide an acoustic interface. The pulse, BP, and O2 saturation were monitored and documented by the physician  and nursing staff throughout the procedure. Upon completion of the imaging, water was removed and the patient was then discharged to recovery in stable condition with the appropriate post procedure care.   FINDINGS: In the duodenal bulb the carcinoid tumor was again identified.  It measured 8 mm x 4 mm and it was hypoechoic.  The majority of the lesion was above the submocosa, but there was one focal area that appeared to be deeper.  This area was examined at 10 MHz, which is the highest frequency.  Because of this deeper extension, I elected not to perform a resection.    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  RECOMMENDATIONS:     1) Surgical consultation.  ___________________________________ Carol Ada, MD eSigned:  Carol Ada, MD 07/26/2014 12:42 PM   cc:

## 2014-07-26 NOTE — Anesthesia Postprocedure Evaluation (Signed)
  Anesthesia Post-op Note  Patient: Tracy Garrett  Procedure(s) Performed: Procedure(s): UPPER ENDOSCOPIC ULTRASOUND (EUS) LINEAR (N/A) Patient is awake and responsive. Pain and nausea are reasonably well controlled. Vital signs are stable and clinically acceptable. Oxygen saturation is clinically acceptable. There are no apparent anesthetic complications at this time. Patient is ready for discharge.

## 2014-07-26 NOTE — Anesthesia Preprocedure Evaluation (Signed)
Anesthesia Evaluation  Patient identified by MRN, date of birth, ID band Patient awake    Reviewed: Allergy & Precautions, H&P , Patient's Chart, lab work & pertinent test results, reviewed documented beta blocker date and time   Airway Mallampati: II  TM Distance: >3 FB Neck ROM: full    Dental no notable dental hx.    Pulmonary sleep apnea ,  breath sounds clear to auscultation  Pulmonary exam normal       Cardiovascular hypertension, Rhythm:regular Rate:Normal     Neuro/Psych    GI/Hepatic   Endo/Other  diabetesMorbid obesity  Renal/GU      Musculoskeletal   Abdominal   Peds  Hematology   Anesthesia Other Findings    Comments History Date Comments    PONV (postoperative nausea and vomiting)  Diabetes mellitus Type II  without mention of complication,  Sleep  Apnea     Reproductive/Obstetrics                             Anesthesia Physical Anesthesia Plan  ASA: III  Anesthesia Plan: MAC   Post-op Pain Management:    Induction: Intravenous  Airway Management Planned: Mask and Natural Airway  Additional Equipment:   Intra-op Plan:   Post-operative Plan:   Informed Consent: I have reviewed the patients History and Physical, chart, labs and discussed the procedure including the risks, benefits and alternatives for the proposed anesthesia with the patient or authorized representative who has indicated his/her understanding and acceptance.   Dental Advisory Given  Plan Discussed with: CRNA and Surgeon  Anesthesia Plan Comments: (Discussed sedation and potential to need to place airway or ETT if warranted by clinical changes intra-operatively. We will start procedure as MAC.)        Anesthesia Quick Evaluation

## 2014-07-29 ENCOUNTER — Encounter (HOSPITAL_COMMUNITY): Payer: Self-pay | Admitting: Gastroenterology

## 2014-08-21 ENCOUNTER — Encounter: Payer: Self-pay | Admitting: General Surgery

## 2014-08-21 NOTE — Progress Notes (Signed)
Patient ID: Tracy Garrett, female   DOB: 1955/06/24, 59 y.o.   MRN: [id number] Tracy Garrett 08/21/2014 9:25 AM Location: [facility] Patient #: [id number] DOB: 05/14/1956 Married / Language: English / Race: Black or African American Female  History of Present Illness Tracy Hollingshead MD; 08/21/2014 10:09 AM) The patient is a 59 year old female   Note:She is referred by Dr. Collene Mares for further evaluation and treatment of a duodenal carcinoid tumor. She had an upper endoscopy done in 2009 which noted a nodular lesion in the duodenal bulb. 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 She had a laparoscopic cholecystectomy in the past and is continued to have right upper quadrant pain radiates to her right flank and right back. She had a repeat upper endoscopy and again the tumor in the duodenal bulb was identified. It was estimated to be 2 cm in size. Biopsy demonstrated duodenal carcinoid. The plan was for endoscopic mucosal resection. Endoscopic ultrasound was performed demonstrating the tumor to be 8 mm x 4 mm in size. However it went deeper into the duodenal wall (deep to the submucosa) so endoscopic mucosal resection was not done and she been referred for further treatment for this. CT scan did not demonstrate any evidence of metastatic disease or the tumor itself.   Other Problems Tracy Garrett, CMA; 08/21/2014 9:25 AM) Cholelithiasis Diabetes Mellitus Gastroesophageal Reflux Disease Hypercholesterolemia Kidney Stone Sleep Apnea  Past Surgical History Tracy Garrett, CMA; 08/21/2014 9:25 AM) Colon Polyp Removal - Colonoscopy Colon Polyp Removal - Open Gallbladder Surgery - Open Hysterectomy (not due to cancer) - Partial Mammoplasty; Reduction Bilateral.  Diagnostic Studies History Tracy Garrett, CMA; 08/21/2014 9:25 AM) Colonoscopy within last year Mammogram within last year Pap Smear >5 years ago  Allergies Tracy Garrett, CMA;  08/21/2014 9:26 AM) No Known Drug Allergies03/16/2016  Medication History (Tracy Garrett, CMA; 08/21/2014 9:27 AM) Multivitamins (Oral) Active. Allegra Allergy (180MG  Tablet, Oral) Active. Omega 3 (1000MG  Capsule, Oral) Active. Medications Reconciled  Social History Tracy Garrett, CMA; 08/21/2014 9:25 AM) Caffeine use Carbonated beverages. No alcohol use No drug use Tobacco use Never smoker.  Family History Tracy Garrett, [place]; 08/21/2014 9:25 AM) Breast Cancer Mother. Colon Polyps Father. Diabetes Mellitus Brother, Sister.  Pregnancy / Birth History Tracy Garrett, [place]; 08/21/2014 9:25 AM) Age at menarche 55 years. Gravida 2 Maternal age 30-25 Para 2 Regular periods  Review of Systems Tracy Garrett CMA; 08/21/2014 9:26 AM) General Not Present- Appetite Loss, Chills, Fatigue, Fever, Night Sweats, Weight Gain and Weight Loss. Skin Not Present- Change in Wart/Mole, Dryness, Hives, Jaundice, New Lesions, Non-Healing Wounds, Rash and Ulcer. HEENT Present- Seasonal Allergies. Not Present- Earache, Hearing Loss, Hoarseness, Nose Bleed, Oral Ulcers, Ringing in the Ears, Sinus Pain, Sore Throat, Visual Disturbances, Wears glasses/contact lenses and Yellow Eyes. Respiratory Not Present- Bloody sputum, Chronic Cough, Difficulty Breathing, Snoring and Wheezing. Breast Not Present- Breast Mass, Breast Pain, Nipple Discharge and Skin Changes. Cardiovascular Not Present- Chest Pain, Difficulty Breathing Lying Down, Leg Cramps, Palpitations, Rapid Heart Rate, Shortness of Breath and Swelling of Extremities. Gastrointestinal Present- Constipation. Not Present- Abdominal Pain, Bloating, Bloody Stool, Change in Bowel Habits, Chronic diarrhea, Difficulty Swallowing, Excessive gas, Gets full quickly at meals, Hemorrhoids, Indigestion, Nausea, Rectal Pain and Vomiting. Female Genitourinary Not Present- Frequency, Nocturia, Painful Urination, Pelvic Pain and Urgency. Musculoskeletal Present- Back  Pain. Not Present- Joint Pain, Joint Stiffness, Muscle Pain, Muscle Weakness and Swelling of Extremities. Neurological Not Present- Decreased Memory, Fainting, Headaches, Numbness, Seizures, Tingling, Tremor, Trouble  walking and Weakness. Psychiatric Not Present- Anxiety, Bipolar, Change in Sleep Pattern, Depression, Fearful and Frequent crying. Endocrine Not Present- Cold Intolerance, Excessive Hunger, Hair Changes, Heat Intolerance, Hot flashes and New Diabetes. Hematology Not Present- Easy Bruising, Excessive bleeding, Gland problems, HIV and Persistent Infections.   Vitals (Tracy Garrett CMA; 08/21/2014 9:26 AM) 08/21/2014 9:26 AM Weight: 222 lb Height: 63in Body Surface Area: 2.12 m Body Mass Index: 39.33 kg/m Temp.: 97.76F(Temporal)  Pulse: 73 (Regular)  BP: 132/80 (Sitting, Left Arm, Standard)    Physical Exam Tracy Hollingshead MD; 08/21/2014 10:11 AM) The physical exam findings are as follows: Note:General: Obese female in NAD. Pleasant and cooperative.  HEENT: [place]/AT, no facial masses  EYES: EOMI, no icterus  CV: RRR, no murmur, no JVD.  CHEST: Breath sounds equal and clear. Respirations nonlabored.  ABDOMEN: Soft, nontender, nondistended, no masses, no organomegaly, active bowel sounds, small upper abdominal scars and lower abdominal scar, no hernias.  MUSCULOSKELETAL: FROM, good muscle tone, no edema  NEUROLOGIC: Alert and oriented, answers questions appropriately, normal gait and station.  PSYCHIATRIC: Normal mood, affect , and behavior.    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 These types of tumors are typically indolent and we discussed that. Resection is still indicated.  Plan: Open wide excision of duodenal carcinoid with primary closure. Small chance she will need a segmental duodenectomy. We discussed the  procedure and risks. Risks include but are not limited to bleeding, infection, wound healing problems, anesthesia, leak, injury to surrounding organs, recurrence. She and her husband seem to understand this and would like to proceed.  Tracy Confer, MD

## 2014-08-25 ENCOUNTER — Encounter: Payer: Self-pay | Admitting: *Deleted

## 2014-09-02 ENCOUNTER — Ambulatory Visit (INDEPENDENT_AMBULATORY_CARE_PROVIDER_SITE_OTHER): Payer: 59 | Admitting: Internal Medicine

## 2014-09-02 ENCOUNTER — Encounter: Payer: Self-pay | Admitting: Internal Medicine

## 2014-09-02 VITALS — BP 126/84 | HR 68 | Temp 97.7°F | Resp 16 | Ht 62.75 in | Wt 220.6 lb

## 2014-09-02 DIAGNOSIS — E785 Hyperlipidemia, unspecified: Secondary | ICD-10-CM

## 2014-09-02 DIAGNOSIS — Z111 Encounter for screening for respiratory tuberculosis: Secondary | ICD-10-CM

## 2014-09-02 DIAGNOSIS — R5383 Other fatigue: Secondary | ICD-10-CM

## 2014-09-02 DIAGNOSIS — I10 Essential (primary) hypertension: Secondary | ICD-10-CM

## 2014-09-02 DIAGNOSIS — K760 Fatty (change of) liver, not elsewhere classified: Secondary | ICD-10-CM

## 2014-09-02 DIAGNOSIS — Z1212 Encounter for screening for malignant neoplasm of rectum: Secondary | ICD-10-CM

## 2014-09-02 DIAGNOSIS — D3A010 Benign carcinoid tumor of the duodenum: Secondary | ICD-10-CM | POA: Insufficient documentation

## 2014-09-02 DIAGNOSIS — Z79899 Other long term (current) drug therapy: Secondary | ICD-10-CM

## 2014-09-02 DIAGNOSIS — E559 Vitamin D deficiency, unspecified: Secondary | ICD-10-CM | POA: Insufficient documentation

## 2014-09-02 DIAGNOSIS — E119 Type 2 diabetes mellitus without complications: Secondary | ICD-10-CM

## 2014-09-02 HISTORY — DX: Fatty (change of) liver, not elsewhere classified: K76.0

## 2014-09-02 LAB — CBC WITH DIFFERENTIAL/PLATELET
Basophils Absolute: 0 10*3/uL (ref 0.0–0.1)
Basophils Relative: 0 % (ref 0–1)
EOS ABS: 0.2 10*3/uL (ref 0.0–0.7)
Eosinophils Relative: 2 % (ref 0–5)
HCT: 40.5 % (ref 36.0–46.0)
HEMOGLOBIN: 13.8 g/dL (ref 12.0–15.0)
LYMPHS ABS: 3.5 10*3/uL (ref 0.7–4.0)
LYMPHS PCT: 38 % (ref 12–46)
MCH: 29.9 pg (ref 26.0–34.0)
MCHC: 34.1 g/dL (ref 30.0–36.0)
MCV: 87.7 fL (ref 78.0–100.0)
MONO ABS: 0.8 10*3/uL (ref 0.1–1.0)
MPV: 10.2 fL (ref 8.6–12.4)
Monocytes Relative: 9 % (ref 3–12)
Neutro Abs: 4.6 10*3/uL (ref 1.7–7.7)
Neutrophils Relative %: 51 % (ref 43–77)
Platelets: 303 10*3/uL (ref 150–400)
RBC: 4.62 MIL/uL (ref 3.87–5.11)
RDW: 14.2 % (ref 11.5–15.5)
WBC: 9.1 10*3/uL (ref 4.0–10.5)

## 2014-09-02 LAB — HEMOGLOBIN A1C
Hgb A1c MFr Bld: 9.1 % — ABNORMAL HIGH (ref <5.7)
MEAN PLASMA GLUCOSE: 214 mg/dL — AB (ref <117)

## 2014-09-02 LAB — LIPID PANEL
CHOLESTEROL: 242 mg/dL — AB (ref 0–200)
HDL: 47 mg/dL (ref >=46)
LDL Cholesterol: 160 mg/dL — ABNORMAL HIGH (ref 0–99)
TRIGLYCERIDES: 173 mg/dL — AB (ref <150)
Total CHOL/HDL Ratio: 5.1 Ratio
VLDL: 35 mg/dL (ref 0–40)

## 2014-09-02 LAB — HEPATIC FUNCTION PANEL
ALT: 17 U/L (ref 0–35)
AST: 14 U/L (ref 0–37)
Albumin: 4.3 g/dL (ref 3.5–5.2)
Alkaline Phosphatase: 76 U/L (ref 39–117)
BILIRUBIN DIRECT: 0.1 mg/dL (ref 0.0–0.3)
BILIRUBIN TOTAL: 0.5 mg/dL (ref 0.2–1.2)
Indirect Bilirubin: 0.4 mg/dL (ref 0.2–1.2)
Total Protein: 7.1 g/dL (ref 6.0–8.3)

## 2014-09-02 LAB — BASIC METABOLIC PANEL WITH GFR
BUN: 16 mg/dL (ref 6–23)
CHLORIDE: 101 meq/L (ref 96–112)
CO2: 24 meq/L (ref 19–32)
Calcium: 9.2 mg/dL (ref 8.4–10.5)
Creat: 0.81 mg/dL (ref 0.50–1.10)
GFR, Est African American: >89 mL/min
GFR, Est Non African American: 80 mL/min
Glucose, Bld: 179 mg/dL — ABNORMAL HIGH (ref 70–99)
POTASSIUM: 4.4 meq/L (ref 3.5–5.3)
Sodium: 139 mEq/L (ref 135–145)

## 2014-09-02 LAB — MAGNESIUM: Magnesium: 1.8 mg/dL (ref 1.5–2.5)

## 2014-09-02 LAB — IRON AND TIBC
%SAT: 20 % (ref 20–55)
IRON: 68 ug/dL (ref 42–145)
TIBC: 348 ug/dL (ref 250–470)
UIBC: 280 ug/dL (ref 125–400)

## 2014-09-02 LAB — VITAMIN B12: Vitamin B-12: 397 pg/mL (ref 211–911)

## 2014-09-02 LAB — TSH: TSH: 1.47 u[IU]/mL (ref 0.350–4.500)

## 2014-09-02 MED ORDER — PHENTERMINE HCL 37.5 MG PO TABS: ORAL_TABLET | ORAL | Status: DC

## 2014-09-02 NOTE — Progress Notes (Signed)
Patient ID: Tracy Garrett, female   DOB: Feb 12, 1956, 59 y.o.   MRN: [id number]  Annual Comprehensive Examination  This very nice 59 y.o.female MBF presents for complete physical.  Patient has been followed for HTN, T2_NIDDM, Hyperlipidemia, and Vitamin D Deficiency. Patient has been followed by Dr Collene Mares & Dr Benson [place] for a Duodenal Carcinoid and has been referred to Dr Zella Richer for tumor resection.     HTN predates since 1998. Patient's BP has been controlled at home and patient denies any cardiac symptoms as chest pain, palpitations, shortness of breath, dizziness or ankle swelling. Today's BP: 126/84 mmHg    Patient's hyperlipidemia is not controlled with diet. Last lipids showed elevated Total Chol 250, Trig 151, HDL 51 and elevated LDL 169 and patient was recommended/prescribed Atorvaststin, but had failed to f/u in office as recommended.    Patient has poorly controlled Morbid Obesity (BMI 39.38) and consequent T2_NIDDM predating since Feb 2008 and patient denies reactive hypoglycemic symptoms, visual blurring, diabetic polys, or paresthesias. Last A1c was 9.0% on 08/31/2013 and patient was recommended/prescribed Metformin, but had failed to f/u in office as recommended.   Finally, patient has history of Vitamin D Deficiency of 16 in 2008 and last Vitamin D was 75 on 08/31/2013.  Medication Sig  . acetaminophen  500 MG tablet Take 1,000 mg by mouth every 6 (six) hours as needed for mild pain.  Marland Kitchen VITAMIN D 5000 UNITS TABS Take 1 tablet by mouth daily.  . diphenhydrAMINE 25 mg capsule Take 25 mg by mouth every 6 (six) hours as needed for itching.  . fexofenadine 180 MG tab Take 180 mg by mouth daily.  . hydrocortisone cream 1 % Apply 1 application topically 3 (three) times daily as needed for itching.  . milk thistle 175 MG tablet Take 175 mg by mouth daily.  . MULTIVITAMIN WITH MINERALS Take 1 tablet by mouth daily.  . OMEGA 3  Take 1 capsule by mouth 2 (two) times daily.   Allergies   Allergen Reactions  . Other     NUTS CAUSE MOUTH TO [facility]   Past Medical History  Diagnosis Date  . Diabetes mellitus without complication   . Allergy   . Type II or unspecified type diabetes mellitus without mention of complication, not stated as uncontrolled   . Hyperlipidemia   . Sleep apnea     test was dx, as mild- no further tx. required  . PONV (postoperative nausea and vomiting)   . History of kidney stones     x2 episodes   Health Maintenance  Topic Date Due  . PNEUMOCOCCAL POLYSACCHARIDE VACCINE (1) 05/05/1958  . FOOT EXAM  05/05/1966  . PAP SMEAR  05/05/1974  . COLONOSCOPY  05/05/2006  . INFLUENZA VACCINE  01/05/2014  . HEMOGLOBIN A1C  03/03/2014  . URINE MICROALBUMIN  09/01/2014  . OPHTHALMOLOGY EXAM  08/14/2015  . MAMMOGRAM  08/15/2015  . TETANUS/TDAP  05/06/2019  . HIV Screening  Completed   Immunization History  Administered Date(s) Administered  . PPD Test 08/31/2013, 09/02/2014  . Tdap 05/05/2009   Past Surgical History  Procedure Laterality Date  . Cholecystectomy  2003  . Breast surgery  1999    Reduction Mammoplasties  . Colonoscopsy  11/2007    neg- polyp removal with both procedures x2  . Vaginal hysterectomy  2002  . Tonsillectomy      and adenoids removed age 59  . Kidney stones      surgery to extract x 1  .  Eus N/A 07/26/2014    Procedure: UPPER ENDOSCOPIC ULTRASOUND (EUS) LINEAR;  Surgeon: Beryle Beams, MD;  Location: [facility];  Service: Endoscopy;  Laterality: N/A;   Family History  Problem Relation Age of Onset  . Cancer Mother    History  Substance Use Topics  . Smoking status: Never Smoker   . Smokeless tobacco: Not on file  . Alcohol Use: No    ROS Constitutional: Denies fever, chills, weight loss/gain, headaches, insomnia, fatigue, night sweats, and change in appetite. Eyes: Denies redness, blurred vision, diplopia, discharge, itchy, watery eyes.  ENT: Denies discharge, congestion, post nasal drip, epistaxis, sore  throat, earache, hearing loss, dental pain, Tinnitus, Vertigo, Sinus pain, snoring.  Cardio: Denies chest pain, palpitations, irregular heartbeat, syncope, dyspnea, diaphoresis, orthopnea, PND, claudication, edema Respiratory: denies cough, dyspnea, DOE, pleurisy, hoarseness, laryngitis, wheezing.  Gastrointestinal: Denies dysphagia, heartburn, reflux, water brash, pain, cramps, nausea, vomiting, bloating, diarrhea, constipation, hematemesis, melena, hematochezia, jaundice, hemorrhoids Genitourinary: Denies dysuria, frequency, urgency, nocturia, hesitancy, discharge, hematuria, flank pain Breast: Breast lumps, nipple discharge, bleeding.  Musculoskeletal: Denies arthralgia, myalgia, stiffness, Jt. Swelling, pain, limp, and strain/sprain. Denies falls. Skin: Denies puritis, rash, hives, warts, acne, eczema, changing in skin lesion Neuro: No weakness, tremor, incoordination, spasms, paresthesia, pain Psychiatric: Denies confusion, memory loss, sensory loss. Denies Depression. Endocrine: Denies change in weight, skin, hair change, nocturia, and paresthesia, diabetic polys, visual blurring, hyper / hypo glycemic episodes.  Heme/Lymph: No excessive bleeding, bruising, enlarged lymph nodes.  Physical Exam  BP 126/84   Pulse 68  Temp 97.7 F   Resp 16  Ht 5' 2.75"  Wt 220 lb 9.6 oz     BMI 39.38   General Appearance: Over nourished and in no apparent distress. Eyes: PERRLA, EOMs, conjunctiva no swelling or erythema, normal fundi and vessels. Sinuses: No frontal/maxillary tenderness ENT/Mouth: EACs patent / TMs  nl. Nares clear without erythema, swelling, mucoid exudates. Oral hygiene is good. No erythema, swelling, or exudate. Tongue normal, non-obstructing. Tonsils not swollen or erythematous. Hearing normal.  Neck: Supple, thyroid normal. No bruits, nodes or JVD. Respiratory: Respiratory effort normal.  BS equal and clear bilateral without rales, rhonci, wheezing or stridor. Cardio: Heart  sounds are normal with regular rate and rhythm and no murmurs, rubs or gallops. Peripheral pulses are normal and equal bilaterally without edema. No aortic or femoral bruits. Chest: symmetric with normal excursions and percussion. Breasts: Symmetric, without lumps, nipple discharge, retractions, or fibrocystic changes.  Abdomen: Rotund, soft, with bowel sounds. Nontender, no guarding, rebound, hernias, masses, or organomegaly.  Lymphatics: Non tender without lymphadenopathy.  Musculoskeletal: Full ROM all peripheral extremities, joint stability, 5/5 strength, and normal gait. Skin: Warm and dry without rashes, lesions, cyanosis, clubbing or  ecchymosis.  Neuro: Cranial nerves intact, reflexes equal bilaterally. Normal muscle tone, no cerebellar symptoms. Sensation intact. By monofilament testing to the toes.  Pysch: Awake and oriented X 3, normal affect, Insight and Judgment appropriate.   Assessment and Plan  1. Essential hypertension  - EKG 12-Lead - [place], RETROPERITNL ABD,  LTD  2. Hyperlipidemia  - Lipid panel  3. Diabetes mellitus type II, controlled  - Microalbumin / creatinine urine ratio - Hemoglobin A1c - Insulin, random  4. Vitamin D deficiency  - Vit D  25 hydroxy (rtn osteoporosis monitoring)  5. Screening for rectal cancer  - POC Hemoccult Bld/Stl (3-Cd Home Screen); Future  6. Other fatigue  - Vitamin B12 - Iron and TIBC - TSH  7. Fatty liver disease, nonalcoholic  8. Benign carcinoid tumor of duodenum  - patient has been advised to defer Surgery pending evaluation/stabilization of her Diabetes.  9. Medication management  - Urine Microscopic - CBC with Differential/Platelet - BASIC METABOLIC PANEL WITH GFR - Hepatic function panel - Magnesium  10. Screening examination for pulmonary tuberculosis  - PPD  Continue prudent diet as discussed, weight control, BP monitoring, regular exercise, and medications. Discussed med's effects and SE's.  Screening labs and tests as requested with regular follow-up as recommended. Over 40 minutes of exam, counseling, chart review was performed.  11. Morbid Obesity (BMI 39 +) -   - Rx Phentermine 37.5 Mg tabs 1/2 to 1 tablet daily

## 2014-09-02 NOTE — Patient Instructions (Signed)
Recommend the book "The END of DIETING" by Dr Excell Seltzer   & the book "The END of DIABETES " by Dr Excell Seltzer  At [email or web address] - get book & Audio CD's      Being diabetic has a  300% increased risk for heart attack, stroke, cancer, and alzheimer- type vascular dementia. It is very important that you work harder with diet by avoiding all foods that are white. Avoid white rice (brown & wild rice is OK), white potatoes (sweetpotatoes in moderation is OK), White bread or wheat bread or anything made out of white flour like bagels, donuts, rolls, buns, biscuits, cakes, pastries, cookies, pizza crust, and pasta (made from white flour & egg whites) - vegetarian pasta or spinach or wheat pasta is OK. Multigrain breads like Arnold's or [employer], or multigrain sandwich thins or flatbreads.  Diet, exercise and weight loss can reverse and cure diabetes in the early stages.  Diet, exercise and weight loss is very important in the control and prevention of complications of diabetes which affects every system in your body, ie. Brain - dementia/stroke, eyes - glaucoma/blindness, heart - heart attack/heart failure, kidneys - dialysis, stomach - gastric paralysis, intestines - malabsorption, nerves - severe painful neuritis, circulation - gangrene & loss of a leg(s), and finally cancer and Alzheimers.    I recommend avoid fried & greasy foods,  sweets/candy, white rice (brown or wild rice or Quinoa is OK), white potatoes (sweet potatoes are OK) - anything made from white flour - bagels, doughnuts, rolls, buns, biscuits,white and wheat breads, pizza crust and traditional pasta made of white flour & egg white(vegetarian pasta or spinach or wheat pasta is OK).  Multi-grain bread is OK - like multi-grain flat bread or sandwich thins. Avoid alcohol in excess. Exercise is also important.    Eat all the vegetables you want - avoid meat, especially red meat and dairy - especially cheese.  Cheese is the most  concentrated form of trans-fats which is the worst thing to clog up our arteries. Veggie cheese is OK which can be found in the fresh produce section at [employer] or [employer] or [employer]  Preventive Care for Adults A healthy lifestyle and preventive care can promote health and wellness. Preventive health guidelines for women include the following key practices.  A routine yearly physical is a good way to check with your health care provider about your health and preventive screening. It is a chance to share any concerns and updates on your health and to receive a thorough exam.  Visit your dentist for a routine exam and preventive care every 6 months. Brush your teeth twice a day and floss once a day. Good oral hygiene prevents tooth decay and gum disease.  The frequency of eye exams is based on your age, health, family medical history, use of contact lenses, and other factors. Follow your health care provider's recommendations for frequency of eye exams.  Eat a healthy diet. Foods like vegetables, fruits, whole grains, low-fat dairy products, and lean protein foods contain the nutrients you need without too many calories. Decrease your intake of foods high in solid fats, added sugars, and salt. Eat the right amount of calories for you.Get information about a proper diet from your health care provider, if necessary.  Regular physical exercise is one of the most important things you can do for your health. Most adults should get at least 150 minutes of moderate-intensity exercise (any activity that increases your heart rate and  causes you to sweat) each week. In addition, most adults need muscle-strengthening exercises on 2 or more days a week.  Maintain a healthy weight. The body mass index (BMI) is a screening tool to identify possible weight problems. It provides an estimate of body fat based on height and weight. Your health care provider can find your BMI and can help you achieve or  maintain a healthy weight.For adults 20 years and older:  A BMI below 18.5 is considered underweight.  A BMI of 18.5 to 24.9 is normal.  A BMI of 25 to 29.9 is considered overweight.  A BMI of 30 and above is considered obese.  Maintain normal blood lipids and cholesterol levels by exercising and minimizing your intake of saturated fat. Eat a balanced diet with plenty of fruit and vegetables. Blood tests for lipids and cholesterol should begin at age 57 and be repeated every 5 years. If your lipid or cholesterol levels are high, you are over 50, or you are at high risk for heart disease, you may need your cholesterol levels checked more frequently.Ongoing high lipid and cholesterol levels should be treated with medicines if diet and exercise are not working.  If you smoke, find out from your health care provider how to quit. If you do not use tobacco, do not start.  Lung cancer screening is recommended for adults aged 52-80 years who are at high risk for developing lung cancer because of a history of smoking. A yearly low-dose CT scan of the lungs is recommended for people who have at least a 30-pack-year history of smoking and are a current smoker or have quit within the past 15 years. A pack year of smoking is smoking an average of 1 pack of cigarettes a day for 1 year (for example: 1 pack a day for 30 years or 2 packs a day for 15 years). Yearly screening should continue until the smoker has stopped smoking for at least 15 years. Yearly screening should be stopped for people who develop a health problem that would prevent them from having lung cancer treatment.  High blood pressure causes heart disease and increases the risk of stroke. Your blood pressure should be checked at least every 1 to 2 years. Ongoing high blood pressure should be treated with medicines if weight loss and exercise do not work.  If you are 43-25 years old, ask your health care provider if you should take aspirin to  prevent strokes.  Diabetes screening involves taking a blood sample to check your fasting blood sugar level. This should be done once every 3 years, after age 36, if you are within normal weight and without risk factors for diabetes. Testing should be considered at a younger age or be carried out more frequently if you are overweight and have at least 1 risk factor for diabetes.  Breast cancer screening is essential preventive care for women. You should practice "breast self-awareness." This means understanding the normal appearance and feel of your breasts and may include breast self-examination. Any changes detected, no matter how small, should be reported to a health care provider. Women in their 73s and 30s should have a clinical breast exam (CBE) by a health care provider as part of a regular health exam every 1 to 3 years. After age 12, women should have a CBE every year. Starting at age 75, women should consider having a mammogram (breast X-ray test) every year. Women who have a family history of breast cancer should talk to  their health care provider about genetic screening. Women at a high risk of breast cancer should talk to their health care providers about having an MRI and a mammogram every year.  Breast cancer gene (BRCA)-related cancer risk assessment is recommended for women who have family members with BRCA-related cancers. BRCA-related cancers include breast, ovarian, tubal, and peritoneal cancers. Having family members with these cancers may be associated with an increased risk for harmful changes (mutations) in the breast cancer genes BRCA1 and BRCA2. Results of the assessment will determine the need for genetic counseling and BRCA1 and BRCA2 testing.  Routine pelvic exams to screen for cancer are no longer recommended for nonpregnant women who are considered low risk for cancer of the pelvic organs (ovaries, uterus, and vagina) and who do not have symptoms. Ask your health care provider  if a screening pelvic exam is right for you.  If you have had past treatment for cervical cancer or a condition that could lead to cancer, you need Pap tests and screening for cancer for at least 20 years after your treatment. If Pap tests have been discontinued, your risk factors (such as having a new sexual partner) need to be reassessed to determine if screening should be resumed. Some women have medical problems that increase the chance of getting cervical cancer. In these cases, your health care provider may recommend more frequent screening and Pap tests.  Colorectal cancer can be detected and often prevented. Most routine colorectal cancer screening begins at the age of 63 years and continues through age 41 years. However, your health care provider may recommend screening at an earlier age if you have risk factors for colon cancer. On a yearly basis, your health care provider may provide home test kits to check for hidden blood in the stool. Use of a small camera at the end of a tube, to directly examine the colon (sigmoidoscopy or colonoscopy), can detect the earliest forms of colorectal cancer. Talk to your health care provider about this at age 57, when routine screening begins. Direct exam of the colon should be repeated every 5-10 years through age 65 years, unless early forms of pre-cancerous polyps or small growths are found.  Hepatitis C blood testing is recommended for all people born from [birth year] through 1965 and any individual with known risks for hepatitis C.  Pra  Osteoporosis is a disease in which the bones lose minerals and strength with aging. This can result in serious bone fractures or breaks. The risk of osteoporosis can be identified using a bone density scan. Women ages 78 years and over and women at risk for fractures or osteoporosis should discuss screening with their health care providers. Ask your health care provider whether you should take a calcium supplement or vitamin D  to reduce the rate of osteoporosis.  Menopause can be associated with physical symptoms and risks. Hormone replacement therapy is available to decrease symptoms and risks. You should talk to your health care provider about whether hormone replacement therapy is right for you.  Use sunscreen. Apply sunscreen liberally and repeatedly throughout the day. You should seek shade when your shadow is shorter than you. Protect yourself by wearing long sleeves, pants, a wide-brimmed hat, and sunglasses year round, whenever you are outdoors.  Once a month, do a whole body skin exam, using a mirror to look at the skin on your back. Tell your health care provider of new moles, moles that have irregular borders, moles that are larger than a pencil  eraser, or moles that have changed in shape or color.  Stay current with required vaccines (immunizations).  Influenza vaccine. All adults should be immunized every year.  Tetanus, diphtheria, and acellular pertussis (Td, Tdap) vaccine. Pregnant women should receive 1 dose of Tdap vaccine during each pregnancy. The dose should be obtained regardless of the length of time since the last dose. Immunization is preferred during the 27th-36th week of gestation. An adult who has not previously received Tdap or who does not know her vaccine status should receive 1 dose of Tdap. This initial dose should be followed by tetanus and diphtheria toxoids (Td) booster doses every 10 years. Adults with an unknown or incomplete history of completing a 3-dose immunization series with Td-containing vaccines should begin or complete a primary immunization series including a Tdap dose. Adults should receive a Td booster every 10 years.  Varicella vaccine. An adult without evidence of immunity to varicella should receive 2 doses or a second dose if she has previously received 1 dose. Pregnant females who do not have evidence of immunity should receive the first dose after pregnancy. This first  dose should be obtained before leaving the health care facility. The second dose should be obtained 4-8 weeks after the first dose.  Human papillomavirus (HPV) vaccine. Females aged 13-26 years who have not received the vaccine previously should obtain the 3-dose series. The vaccine is not recommended for use in pregnant females. However, pregnancy testing is not needed before receiving a dose. If a female is found to be pregnant after receiving a dose, no treatment is needed. In that case, the remaining doses should be delayed until after the pregnancy. Immunization is recommended for any person with an immunocompromised condition through the age of 24 years if she did not get any or all doses earlier. During the 3-dose series, the second dose should be obtained 4-8 weeks after the first dose. The third dose should be obtained 24 weeks after the first dose and 16 weeks after the second dose.  Zoster vaccine. One dose is recommended for adults aged 52 years or older unless certain conditions are present.  Measles, mumps, and rubella (MMR) vaccine. Adults born before [birth year] generally are considered immune to measles and mumps. Adults born in [birth year] or later should have 1 or more doses of MMR vaccine unless there is a contraindication to the vaccine or there is laboratory evidence of immunity to each of the three diseases. A routine second dose of MMR vaccine should be obtained at least 28 days after the first dose for students attending postsecondary schools, health care workers, or international travelers. People who received inactivated measles vaccine or an unknown type of measles vaccine during 1963-1967 should receive 2 doses of MMR vaccine. People who received inactivated mumps vaccine or an unknown type of mumps vaccine before 1979 and are at high risk for mumps infection should consider immunization with 2 doses of MMR vaccine. For females of childbearing age, rubella immunity should be determined. If there  is no evidence of immunity, females who are not pregnant should be vaccinated. If there is no evidence of immunity, females who are pregnant should delay immunization until after pregnancy. Unvaccinated health care workers born before [birth year] who lack laboratory evidence of measles, mumps, or rubella immunity or laboratory confirmation of disease should consider measles and mumps immunization with 2 doses of MMR vaccine or rubella immunization with 1 dose of MMR vaccine.  Pneumococcal 13-valent conjugate (PCV13) vaccine. When indicated, a person who  is uncertain of her immunization history and has no record of immunization should receive the PCV13 vaccine. An adult aged 68 years or older who has certain medical conditions and has not been previously immunized should receive 1 dose of PCV13 vaccine. This PCV13 should be followed with a dose of pneumococcal polysaccharide (PPSV23) vaccine. The PPSV23 vaccine dose should be obtained at least 8 weeks after the dose of PCV13 vaccine. An adult aged 60 years or older who has certain medical conditions and previously received 1 or more doses of PPSV23 vaccine should receive 1 dose of PCV13. The PCV13 vaccine dose should be obtained 1 or more years after the last PPSV23 vaccine dose.    Pneumococcal polysaccharide (PPSV23) vaccine. When PCV13 is also indicated, PCV13 should be obtained first. All adults aged 42 years and older should be immunized. An adult younger than age 80 years who has certain medical conditions should be immunized. Any person who resides in a nursing home or long-term care facility should be immunized. An adult smoker should be immunized. People with an immunocompromised condition and certain other conditions should receive both PCV13 and PPSV23 vaccines. People with human immunodeficiency virus (HIV) infection should be immunized as soon as possible after diagnosis. Immunization during chemotherapy or radiation therapy should be avoided. Routine use  of PPSV23 vaccine is not recommended for American Indians, [place] Natives, or people younger than 65 years unless there are medical conditions that require PPSV23 vaccine. When indicated, people who have unknown immunization and have no record of immunization should receive PPSV23 vaccine. One-time revaccination 5 years after the first dose of PPSV23 is recommended for people aged 19-64 years who have chronic kidney failure, nephrotic syndrome, asplenia, or immunocompromised conditions. People who received 1-2 doses of PPSV23 before age 70 years should receive another dose of PPSV23 vaccine at age 68 years or later if at least 5 years have passed since the previous dose. Doses of PPSV23 are not needed for people immunized with PPSV23 at or after age 38 years.  Preventive Services / Frequency   Ages 9 to 79 years  Blood pressure check.  Lipid and cholesterol check.  Lung cancer screening. / Every year if you are aged 51-80 years and have a 30-pack-year history of smoking and currently smoke or have quit within the past 15 years. Yearly screening is stopped once you have quit smoking for at least 15 years or develop a health problem that would prevent you from having lung cancer treatment.  Clinical breast exam.** / Every year after age 32 years.  BRCA-related cancer risk assessment.** / For women who have family members with a BRCA-related cancer (breast, ovarian, tubal, or peritoneal cancers).  Mammogram.** / Every year beginning at age 21 years and continuing for as long as you are in good health. Consult with your health care provider.  Pap test.** / Every 3 years starting at age 56 years through age 72 or 3 years with a history of 3 consecutive normal Pap tests.  HPV screening.** / Every 3 years from ages 39 years through ages 22 to 61 years with a history of 3 consecutive normal Pap tests.  Fecal occult blood test (FOBT) of stool. / Every year beginning at age 18 years and continuing  until age 70 years. You may not need to do this test if you get a colonoscopy every 10 years.  Flexible sigmoidoscopy or colonoscopy.** / Every 5 years for a flexible sigmoidoscopy or every 10 years for a colonoscopy beginning  at age 78 years and continuing until age 65 years.  Hepatitis C blood test.** / For all people born from [birth year] through 1965 and any individual with known risks for hepatitis C.  Skin self-exam. / Monthly.  Influenza vaccine. / Every year.  Tetanus, diphtheria, and acellular pertussis (Tdap/Td) vaccine.** / Consult your health care provider. Pregnant women should receive 1 dose of Tdap vaccine during each pregnancy. 1 dose of Td every 10 years.  Varicella vaccine.** / Consult your health care provider. Pregnant females who do not have evidence of immunity should receive the first dose after pregnancy.  Zoster vaccine.** / 1 dose for adults aged 64 years or older.  Pneumococcal 13-valent conjugate (PCV13) vaccine.** / Consult your health care provider.  Pneumococcal polysaccharide (PPSV23) vaccine.** / 1 to 2 doses if you smoke cigarettes or if you have certain conditions.  Meningococcal vaccine.** / Consult your health care provider.  Hepatitis A vaccine.** / Consult your health care provider.  Hepatitis B vaccine.** / Consult your health care provider. Screening for abdominal aortic aneurysm (AAA)  by ultrasound is recommended for people over 50 who have history of high blood pressure or who are current or former smokers.

## 2014-09-03 ENCOUNTER — Other Ambulatory Visit: Payer: Self-pay | Admitting: Internal Medicine

## 2014-09-03 DIAGNOSIS — E782 Mixed hyperlipidemia: Secondary | ICD-10-CM

## 2014-09-03 LAB — URINALYSIS, MICROSCOPIC ONLY
Casts: NONE SEEN
Crystals: NONE SEEN

## 2014-09-03 LAB — INSULIN, RANDOM: Insulin: 21.3 u[IU]/mL — ABNORMAL HIGH (ref 2.0–19.6)

## 2014-09-03 LAB — MICROALBUMIN / CREATININE URINE RATIO
Creatinine, Urine: 159 mg/dL
MICROALB/CREAT RATIO: 39 mg/g — AB (ref 0.0–30.0)
Microalb, Ur: 6.2 mg/dL — ABNORMAL HIGH (ref <2.0)

## 2014-09-03 LAB — VITAMIN D 25 HYDROXY (VIT D DEFICIENCY, FRACTURES): Vit D, 25-Hydroxy: 44 ng/mL (ref 30–100)

## 2014-09-03 MED ORDER — ATORVASTATIN CALCIUM 80 MG PO TABS: ORAL_TABLET | ORAL | Status: DC

## 2014-09-03 MED ORDER — METFORMIN HCL ER 500 MG PO TB24: ORAL_TABLET | ORAL | Status: DC

## 2014-09-30 LAB — TB SKIN TEST
Induration: 0 mm
TB SKIN TEST: NEGATIVE

## 2014-10-03 NOTE — Patient Instructions (Addendum)
Tracy Garrett  10/03/2014   Your procedure is scheduled on: 10/10/2014    Report to [facility]  Entrance and follow signs to               [place] at      1015 AM.  Call this number if you have problems the morning of surgery [phone]   Remember:  Do not eat food or drink liquids :After Midnight.  Do NOT take any diabetic medications (Metformin) the morning of surgery.   Take these medicines the morning of surgery with A SIP OF WATER: [place] if needed                               You may not have any metal on your body including hair pins and              piercings  Do not wear jewelry, make-up, lotions, powders or perfumes., deodorant.               Do not wear nail polish.  Do not shave  48 hours prior to surgery.                 Do not bring valuables to the hospital. [place].  Contacts, dentures or bridgework may not be worn into surgery.  Leave suitcase in the car. After surgery it may be brought to your room.         Special Instructions:coughing and deep breathing exercises, leg exercises               Please read over the following fact sheets you were given: _____________________________________________________________________             [facility] - Preparing for Surgery Before surgery, you can play an important role.  Because skin is not sterile, your skin needs to be as free of germs as possible.  You can reduce the number of germs on your skin by washing with CHG (chlorahexidine gluconate) soap before surgery.  CHG is an antiseptic cleaner which kills germs and bonds with the skin to continue killing germs even after washing. Please DO NOT use if you have an allergy to CHG or antibacterial soaps.  If your skin becomes reddened/irritated stop using the CHG and inform your nurse when you arrive at Short Stay. Do not shave (including legs and underarms) for at least 48  hours prior to the first CHG shower.  You may shave your face/neck. Please follow these instructions carefully:  1.  Shower with CHG Soap the night before surgery and the  morning of Surgery.  2.  If you choose to wash your hair, wash your hair first as usual with your  normal  shampoo.  3.  After you shampoo, rinse your hair and body thoroughly to remove the  shampoo.                             4.  Use CHG as you would any other liquid soap.  You can apply chg directly  to the skin and wash  Gently with a scrungie or clean washcloth.  5.  Apply the CHG Soap to your body ONLY FROM THE NECK DOWN.   Do not use on face/ open                           Wound or open sores. Avoid contact with eyes, ears mouth and genitals (private parts).                       Wash face,  Genitals (private parts) with your normal soap.             6.  Wash thoroughly, paying special attention to the area where your surgery  will be performed.  7.  Thoroughly rinse your body with warm water from the neck down.  8.  DO NOT shower/wash with your normal soap after using and rinsing off  the CHG Soap.                9.  Pat yourself dry with a clean towel.            10.  Wear clean pajamas.            11.  Place clean sheets on your bed the night of your first shower and do not  sleep with pets. Day of Surgery : Do not apply any lotions/deodorants the morning of surgery.  Please wear clean clothes to the hospital/surgery center.  FAILURE TO FOLLOW THESE INSTRUCTIONS MAY RESULT IN THE CANCELLATION OF YOUR SURGERY PATIENT SIGNATURE_________________________________  NURSE SIGNATURE__________________________________  ________________________________________________________________________

## 2014-10-04 ENCOUNTER — Encounter (HOSPITAL_COMMUNITY)
Admission: RE | Admit: 2014-10-04 | Discharge: 2014-10-04 | Disposition: A | Discharge status: Home / Self Care | Payer: 59 | Source: Ambulatory Visit | Attending: General Surgery | Admitting: General Surgery

## 2014-10-04 ENCOUNTER — Encounter (HOSPITAL_COMMUNITY): Payer: Self-pay

## 2014-10-04 DIAGNOSIS — Z01812 Encounter for preprocedural laboratory examination: Secondary | ICD-10-CM | POA: Insufficient documentation

## 2014-10-04 DIAGNOSIS — D499 Neoplasm of unspecified behavior of unspecified site: Secondary | ICD-10-CM | POA: Insufficient documentation

## 2014-10-04 HISTORY — DX: Unspecified cataract: H26.9

## 2014-10-04 LAB — CBC WITH DIFFERENTIAL/PLATELET
BASOS PCT: 0 % (ref 0–1)
Basophils Absolute: 0 10*3/uL (ref 0.0–0.1)
EOS ABS: 0.2 10*3/uL (ref 0.0–0.7)
EOS PCT: 2 % (ref 0–5)
HCT: 39.3 % (ref 36.0–46.0)
Hemoglobin: 12.8 g/dL (ref 12.0–15.0)
LYMPHS ABS: 3.2 10*3/uL (ref 0.7–4.0)
LYMPHS PCT: 33 % (ref 12–46)
MCH: 29.4 pg (ref 26.0–34.0)
MCHC: 32.6 g/dL (ref 30.0–36.0)
MCV: 90.1 fL (ref 78.0–100.0)
Monocytes Absolute: 0.7 10*3/uL (ref 0.1–1.0)
Monocytes Relative: 8 % (ref 3–12)
NEUTROS PCT: 57 % (ref 43–77)
Neutro Abs: 5.5 10*3/uL (ref 1.7–7.7)
Platelets: 258 10*3/uL (ref 150–400)
RBC: 4.36 MIL/uL (ref 3.87–5.11)
RDW: 13.7 % (ref 11.5–15.5)
WBC: 9.6 10*3/uL (ref 4.0–10.5)

## 2014-10-04 LAB — COMPREHENSIVE METABOLIC PANEL
ALK PHOS: 78 U/L (ref 39–117)
ALT: 22 U/L (ref 0–35)
AST: 20 U/L (ref 0–37)
Albumin: 4 g/dL (ref 3.5–5.2)
Anion gap: 9 (ref 5–15)
BUN: 17 mg/dL (ref 6–23)
CHLORIDE: 102 mmol/L (ref 96–112)
CO2: 28 mmol/L (ref 19–32)
Calcium: 9.1 mg/dL (ref 8.4–10.5)
Creatinine, Ser: 0.71 mg/dL (ref 0.50–1.10)
GFR calc non Af Amer: >90 mL/min (ref >90)
GLUCOSE: 158 mg/dL — AB (ref 70–99)
Potassium: 4 mmol/L (ref 3.5–5.1)
Sodium: 139 mmol/L (ref 135–145)
TOTAL PROTEIN: 7.3 g/dL (ref 6.0–8.3)
Total Bilirubin: 1 mg/dL (ref 0.3–1.2)

## 2014-10-04 LAB — PROTIME-INR
INR: 0.97 (ref 0.00–1.49)
Prothrombin Time: 13 seconds (ref 11.6–15.2)

## 2014-10-04 LAB — ABO/RH: ABO/RH(D): A POS

## 2014-10-04 NOTE — Pre-Procedure Instructions (Signed)
09/02/14 EKG, epic 10/28/13 2V CXR, epic

## 2014-10-10 ENCOUNTER — Inpatient Hospital Stay (HOSPITAL_COMMUNITY): Payer: 59 | Admitting: Anesthesiology

## 2014-10-10 ENCOUNTER — Encounter (HOSPITAL_COMMUNITY): Payer: Self-pay | Admitting: *Deleted

## 2014-10-10 ENCOUNTER — Inpatient Hospital Stay (HOSPITAL_COMMUNITY)
Admission: RE | Admit: 2014-10-10 | Discharge: 2014-10-16 | DRG: 327 | Disposition: A | Discharge status: Home / Self Care | Payer: 59 | Source: Ambulatory Visit | Attending: General Surgery | Admitting: General Surgery

## 2014-10-10 ENCOUNTER — Encounter (HOSPITAL_COMMUNITY)
Admission: RE | Disposition: A | Discharge status: Home / Self Care | Payer: Self-pay | Source: Ambulatory Visit | Attending: General Surgery

## 2014-10-10 DIAGNOSIS — Z90710 Acquired absence of both cervix and uterus: Secondary | ICD-10-CM

## 2014-10-10 DIAGNOSIS — I10 Essential (primary) hypertension: Secondary | ICD-10-CM | POA: Diagnosis present

## 2014-10-10 DIAGNOSIS — Z9049 Acquired absence of other specified parts of digestive tract: Secondary | ICD-10-CM | POA: Diagnosis present

## 2014-10-10 DIAGNOSIS — K660 Peritoneal adhesions (postprocedural) (postinfection): Secondary | ICD-10-CM | POA: Diagnosis present

## 2014-10-10 DIAGNOSIS — Z91018 Allergy to other foods: Secondary | ICD-10-CM | POA: Diagnosis not present

## 2014-10-10 DIAGNOSIS — K30 Functional dyspepsia: Secondary | ICD-10-CM | POA: Diagnosis not present

## 2014-10-10 DIAGNOSIS — E1165 Type 2 diabetes mellitus with hyperglycemia: Secondary | ICD-10-CM | POA: Diagnosis present

## 2014-10-10 DIAGNOSIS — Z01812 Encounter for preprocedural laboratory examination: Secondary | ICD-10-CM

## 2014-10-10 DIAGNOSIS — Z87442 Personal history of urinary calculi: Secondary | ICD-10-CM

## 2014-10-10 DIAGNOSIS — G4730 Sleep apnea, unspecified: Secondary | ICD-10-CM | POA: Diagnosis present

## 2014-10-10 DIAGNOSIS — K567 Ileus, unspecified: Secondary | ICD-10-CM | POA: Diagnosis not present

## 2014-10-10 DIAGNOSIS — D3A010 Benign carcinoid tumor of the duodenum: Secondary | ICD-10-CM | POA: Diagnosis present

## 2014-10-10 DIAGNOSIS — Z809 Family history of malignant neoplasm, unspecified: Secondary | ICD-10-CM

## 2014-10-10 DIAGNOSIS — Z79899 Other long term (current) drug therapy: Secondary | ICD-10-CM | POA: Diagnosis not present

## 2014-10-10 DIAGNOSIS — D3A019 Benign carcinoid tumor of the small intestine, unspecified portion: Secondary | ICD-10-CM

## 2014-10-10 DIAGNOSIS — R1011 Right upper quadrant pain: Secondary | ICD-10-CM | POA: Diagnosis present

## 2014-10-10 HISTORY — PX: LYSIS OF ADHESION: SHX5961

## 2014-10-10 HISTORY — PX: LAPAROTOMY: SHX154

## 2014-10-10 LAB — GLUCOSE, CAPILLARY
Glucose-Capillary: 136 mg/dL — ABNORMAL HIGH (ref 70–99)
Glucose-Capillary: 202 mg/dL — ABNORMAL HIGH (ref 70–99)
Glucose-Capillary: 160 mg/dL — ABNORMAL HIGH (ref 70–99)

## 2014-10-10 LAB — TYPE AND SCREEN
ABO/RH(D): A POS
Antibody Screen: NEGATIVE

## 2014-10-10 SURGERY — LAPAROTOMY, EXPLORATORY
Anesthesia: Monitor Anesthesia Care | Site: Abdomen

## 2014-10-10 MED ORDER — SUCCINYLCHOLINE CHLORIDE 20 MG/ML IJ SOLN
INTRAMUSCULAR | Status: DC | PRN
  Administered 2014-10-10: 100 mg via INTRAVENOUS

## 2014-10-10 MED ORDER — CEFAZOLIN SODIUM 1-5 GM-% IV SOLN
1.0000 g | Freq: Four times a day (QID) | INTRAVENOUS | Status: AC
  Administered 2014-10-10 – 2014-10-11 (×3): 1 g via INTRAVENOUS
  Filled 2014-10-10 (×3): qty 50

## 2014-10-10 MED ORDER — ONDANSETRON HCL 4 MG/2ML IJ SOLN: 4.0000 mg | Freq: Four times a day (QID) | INTRAMUSCULAR | Status: DC | PRN

## 2014-10-10 MED ORDER — DEXAMETHASONE SODIUM PHOSPHATE 10 MG/ML IJ SOLN
INTRAMUSCULAR | Status: AC
  Filled 2014-10-10: qty 1

## 2014-10-10 MED ORDER — 0.9 % SODIUM CHLORIDE (POUR BTL) OPTIME
TOPICAL | Status: DC | PRN
  Administered 2014-10-10: 3000 mL

## 2014-10-10 MED ORDER — CEFAZOLIN SODIUM-DEXTROSE 2-3 GM-% IV SOLR
INTRAVENOUS | Status: AC
  Filled 2014-10-10: qty 50

## 2014-10-10 MED ORDER — DIPHENHYDRAMINE HCL 50 MG/ML IJ SOLN
12.5000 mg | Freq: Once | INTRAMUSCULAR | Status: AC
  Administered 2014-10-10: 12.5 mg via INTRAVENOUS

## 2014-10-10 MED ORDER — MORPHINE SULFATE (PF) 1 MG/ML IV SOLN
INTRAVENOUS | Status: AC
  Filled 2014-10-10: qty 25

## 2014-10-10 MED ORDER — INSULIN ASPART 100 UNIT/ML ~~LOC~~ SOLN
SUBCUTANEOUS | Status: AC
  Filled 2014-10-10: qty 1

## 2014-10-10 MED ORDER — PANTOPRAZOLE SODIUM 40 MG IV SOLR
40.0000 mg | INTRAVENOUS | Status: DC
  Administered 2014-10-10 – 2014-10-15 (×6): 40 mg via INTRAVENOUS
  Filled 2014-10-10 (×8): qty 40

## 2014-10-10 MED ORDER — KCL IN DEXTROSE-NACL 20-5-0.9 MEQ/L-%-% IV SOLN
INTRAVENOUS | Status: DC
  Administered 2014-10-10: 125 mL/h via INTRAVENOUS
  Administered 2014-10-11 (×2): via INTRAVENOUS
  Administered 2014-10-12: 125 mL/h via INTRAVENOUS
  Administered 2014-10-12 (×2): via INTRAVENOUS
  Administered 2014-10-13: 75 mL via INTRAVENOUS
  Administered 2014-10-14 – 2014-10-15 (×4): via INTRAVENOUS
  Filled 2014-10-10 (×15): qty 1000

## 2014-10-10 MED ORDER — LACTATED RINGERS IV SOLN: INTRAVENOUS | Status: DC

## 2014-10-10 MED ORDER — CISATRACURIUM BESYLATE (PF) 10 MG/5ML IV SOLN
INTRAVENOUS | Status: DC | PRN
  Administered 2014-10-10: 6 mg via INTRAVENOUS
  Administered 2014-10-10 (×2): 2 mg via INTRAVENOUS

## 2014-10-10 MED ORDER — MORPHINE SULFATE 10 MG/ML IJ SOLN
INTRAMUSCULAR | Status: AC
  Filled 2014-10-10: qty 1

## 2014-10-10 MED ORDER — PROPOFOL 10 MG/ML IV BOLUS
INTRAVENOUS | Status: DC | PRN
  Administered 2014-10-10: 150 mg via INTRAVENOUS

## 2014-10-10 MED ORDER — DIPHENHYDRAMINE HCL 50 MG/ML IJ SOLN
INTRAMUSCULAR | Status: AC
  Filled 2014-10-10: qty 1

## 2014-10-10 MED ORDER — PROPOFOL 10 MG/ML IV BOLUS
INTRAVENOUS | Status: AC
  Filled 2014-10-10: qty 20

## 2014-10-10 MED ORDER — FENTANYL CITRATE (PF) 100 MCG/2ML IJ SOLN
INTRAMUSCULAR | Status: AC
  Filled 2014-10-10: qty 2

## 2014-10-10 MED ORDER — GLYCOPYRROLATE 0.2 MG/ML IJ SOLN
INTRAMUSCULAR | Status: AC
  Filled 2014-10-10: qty 3

## 2014-10-10 MED ORDER — FENTANYL CITRATE (PF) 250 MCG/5ML IJ SOLN
INTRAMUSCULAR | Status: AC
  Filled 2014-10-10: qty 5

## 2014-10-10 MED ORDER — HYDROMORPHONE HCL 1 MG/ML IJ SOLN
0.2500 mg | INTRAMUSCULAR | Status: DC | PRN
Start: 2014-10-10 — End: 2014-10-10
  Administered 2014-10-10 (×2): 0.5 mg via INTRAVENOUS

## 2014-10-10 MED ORDER — MORPHINE SULFATE 10 MG/ML IJ SOLN
1.0000 mg | INTRAMUSCULAR | Status: DC | PRN
  Administered 2014-10-10 (×5): 2 mg via INTRAVENOUS

## 2014-10-10 MED ORDER — FENTANYL CITRATE (PF) 100 MCG/2ML IJ SOLN
25.0000 ug | INTRAMUSCULAR | Status: DC | PRN
  Administered 2014-10-10 (×2): 50 ug via INTRAVENOUS

## 2014-10-10 MED ORDER — ONDANSETRON HCL 4 MG/2ML IJ SOLN
INTRAMUSCULAR | Status: AC
  Filled 2014-10-10: qty 2

## 2014-10-10 MED ORDER — CEFAZOLIN SODIUM-DEXTROSE 2-3 GM-% IV SOLR
2.0000 g | INTRAVENOUS | Status: AC
  Administered 2014-10-10: 2 g via INTRAVENOUS

## 2014-10-10 MED ORDER — ONDANSETRON HCL 4 MG/2ML IJ SOLN
INTRAMUSCULAR | Status: DC | PRN
  Administered 2014-10-10: 4 mg via INTRAVENOUS

## 2014-10-10 MED ORDER — LACTATED RINGERS IV SOLN
INTRAVENOUS | Status: DC | PRN
  Administered 2014-10-10 (×2): via INTRAVENOUS

## 2014-10-10 MED ORDER — FENTANYL CITRATE (PF) 250 MCG/5ML IJ SOLN
INTRAMUSCULAR | Status: DC | PRN
  Administered 2014-10-10 (×2): 25 ug via INTRAVENOUS
  Administered 2014-10-10: 50 ug via INTRAVENOUS
  Administered 2014-10-10: 25 ug via INTRAVENOUS
  Administered 2014-10-10: 50 ug via INTRAVENOUS
  Administered 2014-10-10: 25 ug via INTRAVENOUS
  Administered 2014-10-10 (×3): 50 ug via INTRAVENOUS

## 2014-10-10 MED ORDER — MIDAZOLAM HCL 2 MG/2ML IJ SOLN
INTRAMUSCULAR | Status: AC
  Filled 2014-10-10: qty 2

## 2014-10-10 MED ORDER — SODIUM CHLORIDE 0.9 % IJ SOLN: 9.0000 mL | INTRAMUSCULAR | Status: DC | PRN

## 2014-10-10 MED ORDER — DIPHENHYDRAMINE HCL 50 MG/ML IJ SOLN: 12.5000 mg | Freq: Four times a day (QID) | INTRAMUSCULAR | Status: DC | PRN

## 2014-10-10 MED ORDER — DEXAMETHASONE SODIUM PHOSPHATE 10 MG/ML IJ SOLN
INTRAMUSCULAR | Status: DC | PRN
  Administered 2014-10-10: 10 mg via INTRAVENOUS

## 2014-10-10 MED ORDER — CETYLPYRIDINIUM CHLORIDE 0.05 % MT LIQD
7.0000 mL | Freq: Two times a day (BID) | OROMUCOSAL | Status: DC
  Administered 2014-10-10 – 2014-10-14 (×6): 7 mL via OROMUCOSAL

## 2014-10-10 MED ORDER — CISATRACURIUM BESYLATE 20 MG/10ML IV SOLN
INTRAVENOUS | Status: AC
  Filled 2014-10-10: qty 10

## 2014-10-10 MED ORDER — ONDANSETRON HCL 4 MG PO TABS: 4.0000 mg | ORAL_TABLET | Freq: Four times a day (QID) | ORAL | Status: DC | PRN

## 2014-10-10 MED ORDER — NEOSTIGMINE METHYLSULFATE 10 MG/10ML IV SOLN
INTRAVENOUS | Status: DC | PRN
  Administered 2014-10-10: 4 mg via INTRAVENOUS

## 2014-10-10 MED ORDER — INSULIN ASPART 100 UNIT/ML ~~LOC~~ SOLN
0.0000 [IU] | SUBCUTANEOUS | Status: DC
  Administered 2014-10-10: 3 [IU] via SUBCUTANEOUS
  Administered 2014-10-10: 5 [IU] via SUBCUTANEOUS
  Administered 2014-10-10: 3 [IU] via SUBCUTANEOUS
  Administered 2014-10-11: 2 [IU] via SUBCUTANEOUS
  Administered 2014-10-11 (×4): 3 [IU] via SUBCUTANEOUS
  Administered 2014-10-11: 5 [IU] via SUBCUTANEOUS
  Administered 2014-10-12 (×3): 2 [IU] via SUBCUTANEOUS
  Administered 2014-10-12: 3 [IU] via SUBCUTANEOUS
  Administered 2014-10-13 (×2): 2 [IU] via SUBCUTANEOUS
  Administered 2014-10-13: 3 [IU] via SUBCUTANEOUS
  Administered 2014-10-13 – 2014-10-14 (×3): 2 [IU] via SUBCUTANEOUS
  Administered 2014-10-14 (×2): 3 [IU] via SUBCUTANEOUS
  Administered 2014-10-14: 5 [IU] via SUBCUTANEOUS
  Administered 2014-10-15 (×3): 2 [IU] via SUBCUTANEOUS
  Administered 2014-10-15: 5 [IU] via SUBCUTANEOUS
  Administered 2014-10-15: 3 [IU] via SUBCUTANEOUS
  Administered 2014-10-16: 2 [IU] via SUBCUTANEOUS
  Administered 2014-10-16: 3 [IU] via SUBCUTANEOUS
  Administered 2014-10-16: 2 [IU] via SUBCUTANEOUS

## 2014-10-10 MED ORDER — NALOXONE HCL 0.4 MG/ML IJ SOLN: 0.4000 mg | INTRAMUSCULAR | Status: DC | PRN

## 2014-10-10 MED ORDER — MORPHINE SULFATE (PF) 1 MG/ML IV SOLN
INTRAVENOUS | Status: DC
  Administered 2014-10-10: 16.5 mg via INTRAVENOUS
  Administered 2014-10-10: 18:00:00 via INTRAVENOUS
  Administered 2014-10-10: 13.5 mg via INTRAVENOUS
  Administered 2014-10-10 (×2): via INTRAVENOUS
  Administered 2014-10-11: 13.5 mg via INTRAVENOUS
  Administered 2014-10-11: 6 mg via INTRAVENOUS
  Administered 2014-10-11: 7.5 mg via INTRAVENOUS
  Administered 2014-10-11: 22:00:00 via INTRAVENOUS
  Administered 2014-10-12: 1.5 mg via INTRAVENOUS
  Administered 2014-10-12: 3 mg via INTRAVENOUS
  Administered 2014-10-12: 7.5 mg via INTRAVENOUS
  Administered 2014-10-12: 3 mg via INTRAVENOUS
  Administered 2014-10-12: 7.5 mg via INTRAVENOUS
  Administered 2014-10-12: 1.5 mg via INTRAVENOUS
  Administered 2014-10-13: 10.5 mg via INTRAVENOUS
  Administered 2014-10-13: 7.5 mg via INTRAVENOUS
  Administered 2014-10-13: 18:00:00 via INTRAVENOUS
  Administered 2014-10-13: 4.5 mg via INTRAVENOUS
  Administered 2014-10-13: 3 mg via INTRAVENOUS
  Administered 2014-10-14: 4.5 mg via INTRAVENOUS
  Administered 2014-10-14: 3 mg via INTRAVENOUS
  Administered 2014-10-14: 12 mg via INTRAVENOUS
  Administered 2014-10-14: 3 mg via INTRAVENOUS
  Administered 2014-10-14: 9 mg via INTRAVENOUS
  Administered 2014-10-14: 7.5 mg via INTRAVENOUS
  Administered 2014-10-14: 6 mg via INTRAVENOUS
  Administered 2014-10-14: 09:00:00 via INTRAVENOUS
  Administered 2014-10-15: 10.5 mg via INTRAVENOUS
  Filled 2014-10-10 (×8): qty 25

## 2014-10-10 MED ORDER — ENOXAPARIN SODIUM 40 MG/0.4ML ~~LOC~~ SOLN
40.0000 mg | SUBCUTANEOUS | Status: DC
  Administered 2014-10-11 – 2014-10-16 (×6): 40 mg via SUBCUTANEOUS
  Filled 2014-10-10 (×6): qty 0.4

## 2014-10-10 MED ORDER — DIPHENHYDRAMINE HCL 12.5 MG/5ML PO ELIX: 12.5000 mg | ORAL_SOLUTION | Freq: Four times a day (QID) | ORAL | Status: DC | PRN

## 2014-10-10 MED ORDER — MIDAZOLAM HCL 5 MG/5ML IJ SOLN
INTRAMUSCULAR | Status: DC | PRN
  Administered 2014-10-10: 2 mg via INTRAVENOUS

## 2014-10-10 MED ORDER — HYDROMORPHONE HCL 1 MG/ML IJ SOLN
INTRAMUSCULAR | Status: AC
  Filled 2014-10-10: qty 1

## 2014-10-10 MED ORDER — GLYCOPYRROLATE 0.2 MG/ML IJ SOLN
INTRAMUSCULAR | Status: DC | PRN
  Administered 2014-10-10: 0.6 mg via INTRAVENOUS

## 2014-10-10 SURGICAL SUPPLY — 38 items
APPLICATOR COTTON TIP 6IN STRL (MISCELLANEOUS) ×4 IMPLANT
BLADE EXTENDED COATED 6.5IN (ELECTRODE) IMPLANT
BLADE HEX COATED 2.75 (ELECTRODE) ×3 IMPLANT
COVER MAYO STAND STRL (DRAPES) ×3 IMPLANT
DRAIN CHANNEL 19F RND (DRAIN) ×1 IMPLANT
DRAPE LAPAROSCOPIC ABDOMINAL (DRAPES) ×3 IMPLANT
DRAPE UTILITY XL STRL (DRAPES) ×3 IMPLANT
DRAPE WARM FLUID 44X44 (DRAPE) ×3 IMPLANT
ELECT REM PT RETURN 9FT ADLT (ELECTROSURGICAL) ×3
ELECTRODE REM PT RTRN 9FT ADLT (ELECTROSURGICAL) ×2 IMPLANT
EVACUATOR DRAINAGE 10X20 100CC (DRAIN) IMPLANT
EVACUATOR SILICONE 100CC (DRAIN) ×1 IMPLANT
GAUZE SPONGE 4X4 12PLY STRL (GAUZE/BANDAGES/DRESSINGS) ×3 IMPLANT
GLOVE ECLIPSE 8.0 STRL XLNG CF (GLOVE) ×3 IMPLANT
GLOVE INDICATOR 8.0 STRL GRN (GLOVE) ×6 IMPLANT
GOWN STRL REUS W/TWL XL LVL3 (GOWN DISPOSABLE) ×10 IMPLANT
KIT BASIN OR (CUSTOM PROCEDURE TRAY) ×3 IMPLANT
NS IRRIG 1000ML POUR BTL (IV SOLUTION) ×3 IMPLANT
PACK GENERAL/GYN (CUSTOM PROCEDURE TRAY) ×3 IMPLANT
SPONGE LAP 18X18 X RAY DECT (DISPOSABLE) ×4 IMPLANT
STAPLER VISISTAT 35W (STAPLE) ×3 IMPLANT
SUCTION POOLE TIP (SUCTIONS) ×3 IMPLANT
SUT ETHILON 3 0 PS 1 (SUTURE) ×1 IMPLANT
SUT PDS AB 1 CTX 36 (SUTURE) IMPLANT
SUT PDS AB 1 TP1 96 (SUTURE) ×1 IMPLANT
SUT SILK 2 0 (SUTURE) ×3
SUT SILK 2 0 SH CR/8 (SUTURE) ×3 IMPLANT
SUT SILK 2-0 18XBRD TIE 12 (SUTURE) ×2 IMPLANT
SUT SILK 3 0 (SUTURE) ×3
SUT SILK 3 0 SH CR/8 (SUTURE) ×4 IMPLANT
SUT SILK 3-0 18XBRD TIE 12 (SUTURE) ×2 IMPLANT
SUT VIC AB 3-0 SH 18 (SUTURE) IMPLANT
SUT VICRYL 2 0 18  UND BR (SUTURE)
SUT VICRYL 2 0 18 UND BR (SUTURE) IMPLANT
TAPE CLOTH SURG 6X10 WHT LF (GAUZE/BANDAGES/DRESSINGS) ×1 IMPLANT
TOWEL OR 17X26 10 PK STRL BLUE (TOWEL DISPOSABLE) ×5 IMPLANT
TOWEL OR NON WOVEN STRL DISP B (DISPOSABLE) ×3 IMPLANT
TRAY FOLEY W/METER SILVER 14FR (SET/KITS/TRAYS/PACK) ×3 IMPLANT

## 2014-10-10 NOTE — Anesthesia Postprocedure Evaluation (Signed)
  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

## 2014-10-10 NOTE — H&P (Signed)
Tracy Garrett is an 59 y.o. female.   Chief Complaint:   Here for elective surgery. HPI:   She had an upper endoscopy done in 2009 which noted a nodular lesion in the duodenal bulb. Biopsies this time demonstrated a benign lesion. She had a laparoscopic cholecystectomy in the past and is continued to have right upper quadrant pain radiates to her right flank and right back. She had a repeat upper endoscopy and again the tumor in the duodenal bulb was identified. It was estimated to be 2 cm in size. Biopsy demonstrated duodenal carcinoid. The plan was for endoscopic mucosal resection. Endoscopic ultrasound was performed demonstrating the tumor to be 8 mm x 4 mm in size. However it went deeper into the duodenal wall (deep to the submucosa) so endoscopic mucosal resection was not done and she been referred for further treatment for this. CT scan did not demonstrate any evidence of metastatic disease or the tumor itself.   Past Medical History  Diagnosis Date  . Allergy   . Hyperlipidemia   . History of kidney stones     x2 episodes  . PONV (postoperative nausea and vomiting)   . Sleep apnea     test was dx, as mild- no further tx. required  . Diabetes mellitus without complication   . Type II or unspecified type diabetes mellitus without mention of complication, not stated as uncontrolled   . Cataracts, bilateral     Past Surgical History  Procedure Laterality Date  . Cholecystectomy  2003  . Breast surgery  1999    Reduction Mammoplasties  . Colonoscopsy  11/2007    neg- polyp removal with both procedures x2  . Vaginal hysterectomy  2002  . Tonsillectomy      and adenoids removed age 75  . Kidney stones      surgery to extract x 1  . Eus N/A 07/26/2014    Procedure: UPPER ENDOSCOPIC ULTRASOUND (EUS) LINEAR;  Surgeon: Beryle Beams, MD;  Location: [facility];  Service: Endoscopy;  Laterality: N/A;    Family History  Problem Relation Age of Onset  . Cancer Mother    Social  History:  reports that she has never smoked. She does not have any smokeless tobacco history on file. She reports that she does not drink alcohol or use illicit drugs.  Allergies:  Allergies  Allergen Reactions  . Other     NUTS CAUSE MOUTH TO ITCH    Medications Prior to Admission  Medication Sig Dispense Refill  . acetaminophen (TYLENOL) 500 MG tablet Take 1,000 mg by mouth every 6 (six) hours as needed for mild pain.    Marland Kitchen atorvastatin (LIPITOR) 80 MG tablet Take 1/2 to 1 tablet daily or as directed  For Cholesterol (Patient taking differently: Take 80 mg by mouth every evening. ) 30 tablet 5  . Cholecalciferol (VITAMIN D3) 5000 UNITS TABS Take 1 tablet by mouth every other day.     . diphenhydrAMINE (BENADRYL) 25 mg capsule Take 25 mg by mouth every 6 (six) hours as needed for itching.    . fexofenadine (ALLEGRA) 180 MG tablet Take 180 mg by mouth daily as needed for allergies.     . metFORMIN (GLUCOPHAGE XR) 500 MG 24 hr tablet Take 1 tablet 2 x day after Breakfast &Lunch and 2 tablets after supper for Diabetes 120 tablet 5  . Omega-3 Fatty Acids (OMEGA 3 PO) Take 1 capsule by mouth 2 (two) times daily.    . phentermine (  ADIPEX-P) 37.5 MG tablet Take 1/2 to 1 tablet each morning to help with weight loss 30 tablet 5  . hydrocortisone cream 1 % Apply 1 application topically 3 (three) times daily as needed for itching.      Results for orders placed or performed during the hospital encounter of 10/10/14 (from the past 48 hour(s))  Glucose, capillary     Status: Abnormal   Collection Time: 10/10/14 10:43 AM  Result Value Ref Range   Glucose-Capillary 136 (H) 70 - 99 mg/dL   Comment 1 Notify RN    No results found.  Review of Systems  Constitutional: Negative for fever and chills.  HENT: Positive for congestion (allergies).   Gastrointestinal: Negative for nausea, vomiting and abdominal pain.    Blood pressure 150/93, pulse 79, temperature 98.1 F (36.7 C), temperature source  Oral, resp. rate 16, height 5\' 3"  (1.6 m), weight 101.152 kg (223 lb), SpO2 100 %. Physical Exam  Constitutional: No distress.  Overweight female.  Neck: Neck supple.  Cardiovascular: Normal rate and regular rhythm.   Respiratory: Effort normal and breath sounds normal.  GI: Soft. She exhibits no mass. There is no tenderness.  Musculoskeletal: She exhibits no edema.  Neurological: She is alert.  Skin: Skin is warm and dry.  Psychiatric: She has a normal mood and affect. Her behavior is normal.     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  Plan:  Laparotomy and excision of duodenal carcinoid.  Janzen Sacks J 10/10/2014, 11:14 AM

## 2014-10-10 NOTE — Transfer of Care (Signed)
Immediate Anesthesia Transfer of Care Note  Patient: Tracy Garrett  Procedure(s) Performed: Procedure(s): EXPLORATORY LAPAROTOMY EXCISION OF 44mm DUODENAL CARCINOID TUMOR (N/A) LYSIS OF ADHESION  Patient Location: PACU  Anesthesia Type:General  Level of Consciousness: awake and patient cooperative  Airway & Oxygen Therapy: Patient Spontanous Breathing and Patient connected to face mask oxygen  Post-op Assessment: Report given to RN and Post -op Vital signs reviewed and stable  Post vital signs: Reviewed and stable  Last Vitals:  Filed Vitals:   10/10/14 1038  BP: 150/93  Pulse: 79  Temp: 36.7 C  Resp: 16    Complications: No apparent anesthesia complications

## 2014-10-10 NOTE — Anesthesia Preprocedure Evaluation (Signed)
Anesthesia Evaluation  Patient identified by MRN, date of birth, ID band Patient awake    History of Anesthesia Complications (+) PONV  Airway Mallampati: II  TM Distance: <3 FB Neck ROM: Full    Dental   Pulmonary sleep apnea ,  breath sounds clear to auscultation        Cardiovascular hypertension, Rhythm:Regular Rate:Normal     Neuro/Psych    GI/Hepatic negative GI ROS, Neg liver ROS,   Endo/Other  diabetes  Renal/GU negative Renal ROS     Musculoskeletal   Abdominal   Peds  Hematology   Anesthesia Other Findings   Reproductive/Obstetrics                             Anesthesia Physical Anesthesia Plan  ASA: III  Anesthesia Plan: MAC   Post-op Pain Management:    Induction: Intravenous  Airway Management Planned:   Additional Equipment:   Intra-op Plan:   Post-operative Plan:   Informed Consent: I have reviewed the patients History and Physical, chart, labs and discussed the procedure including the risks, benefits and alternatives for the proposed anesthesia with the patient or authorized representative who has indicated his/her understanding and acceptance.   Dental advisory given  Plan Discussed with: Anesthesiologist and CRNA  Anesthesia Plan Comments:         Anesthesia Quick Evaluation

## 2014-10-10 NOTE — Interval H&P Note (Signed)
History and Physical Interval Note:  10/10/2014 11:50 AM  Tracy Garrett  has presented today for surgery, with the diagnosis of DUODENAL TUMOR 64mm  The various methods of treatment have been discussed with the patient and family. After consideration of risks, benefits and other options for treatment, the patient has consented to  Procedure(s): EXPLORATORY LAPAROTOMY EXCISION OF 78mm DUODENAL CARCINOID TUMOR (N/A) as a surgical intervention .  The patient's history has been reviewed, patient examined, no change in status, stable for surgery.  I have reviewed the patient's chart and labs.  Questions were answered to the patient's satisfaction.     Thais Silberstein Lenna Sciara

## 2014-10-10 NOTE — Anesthesia Procedure Notes (Signed)
Procedure Name: Intubation Date/Time: 10/10/2014 12:01 PM Performed by: Dione Booze Pre-anesthesia Checklist: Patient identified, Emergency Drugs available, Suction available and Patient being monitored Patient Re-evaluated:Patient Re-evaluated prior to inductionOxygen Delivery Method: Circle system utilized Preoxygenation: Pre-oxygenation with 100% oxygen Intubation Type: IV induction Laryngoscope Size: Mac and 4 Grade View: Grade II Tube type: Oral Tube size: 7.5 mm Number of attempts: 1 Airway Equipment and Method: Stylet Dental Injury: Teeth and Oropharynx as per pre-operative assessment

## 2014-10-10 NOTE — Progress Notes (Signed)
PACU note-  Dr. Oletta Lamas, anesthes, notified of CBG result, pain complaints, and elevated blood pressure; will continue to observe BP, give additional pain med, and follow sliding scale for CBG result; meds given

## 2014-10-10 NOTE — Op Note (Signed)
Operative Note  Tracy Garrett female 59 y.o. 10/10/2014  PREOPERATIVE DX:  Duodenal carcinoid tumor  POSTOPERATIVE DX:  Same  PROCEDURE:   Exploratory laparotomy, lysis of adhesions for 45 minutes, excision of duodenal carcinoid tumor.         Surgeon: [name]   Assistants: Stark Klein, MD  Anesthesia: General endotracheal anesthesia  Indications:   This is a 59 year old female who said some chronic right upper quadrant pain.  She had an upper endoscopy earlier this year and a nodular lesion was noted in the duodenal bulb. A biopsy was performed and it was consistent with carcinoid tumor. Endoscopic ultrasound was performed and the tumor was noted to extend deep into the duodenal wall thus endoscopic mucosal resection could not be done. She now presents for the above procedure..    Procedure Detail:  She was brought to the operating room placed supine on the operating table and a general anesthetic was given. A Foley catheter was inserted. The abdominal wall was sterilely prepped and draped.  An upper midline incision was made dividing the skin, subcutaneous tissue, fascia, and peritoneum. The Bookwalter retractor was used for exposure. The stomach was identified. There were adhesions between the omentum and liver and the duodenum and liver secondary to a prior laparoscopic cholecystectomy. These adhesions were carefully divided. There were also noted to be adhesions between the transverse colon and the liver which were mobilized. This took approximately 45 minutes.  Following this, a Kocher maneuver was performed.  Palpation of the duodenal bulb demonstrated a lesion on the anterior wall. A longitudinal incision was made in the duodenal bulb inferior to the lesion after stay sutures were placed. The tumor was identified and excised using electrocautery. The superior margin appeared grossly close so more superior tissue was taken. The tumor was sent for frozen section and  was consistent with a neuroendocrine tumor/carcinoid. The superior margin was sent separately.  The defect and duodenal bulb was then closed in a transverse fashion using interrupted 3-0 silk sutures area there was no evidence of leak or tension.. An nasogastric tube was in place with the tip in the proximal antrum of the stomach. An omental patch was then placed over the duodenal closure and held in place with 3-0 silk sutures tied down. A stab wound was made in the right abdomen and a 19 Blake drain was placed into the abdominal cavity and positioned so it was lateral and posterior to the duodenal closure. It was anchored to skin with 3-0 nylon suture.  The area was irrigated and there is no evidence of bile leak or bleeding. The retractor was removed. The fascia was closed with running double looped #1 PDS suture. The subcutaneous tissues were irrigated and the skin was closed with staples. Needle sponge and instrument counts were reported to be correct.  She tolerated the procedure well without any apparent complications and was taken to the recovery room in satisfactory condition.   Estimated Blood Loss:  300 mL         Drains: #19 Blake  Blood Given: none          Specimens: duodenal carcinoid tumor and superior margin        Complications:  * No complications entered in OR log *         Disposition: PACU - hemodynamically stable.         Condition: stable

## 2014-10-10 NOTE — Progress Notes (Signed)
Pt continues to c/o pain level "10", moaning and crying, also note right antecubital area reddened with rash; Dr. Oletta Lamas notified, order rec'd for pain meds and med for possible allergic reaction and meds given

## 2014-10-11 ENCOUNTER — Encounter (HOSPITAL_COMMUNITY): Payer: Self-pay | Admitting: General Surgery

## 2014-10-11 LAB — CBC
HCT: 40.8 % (ref 36.0–46.0)
Hemoglobin: 13.1 g/dL (ref 12.0–15.0)
MCH: 29.5 pg (ref 26.0–34.0)
MCHC: 32.1 g/dL (ref 30.0–36.0)
MCV: 91.9 fL (ref 78.0–100.0)
Platelets: 312 10*3/uL (ref 150–400)
RBC: 4.44 MIL/uL (ref 3.87–5.11)
RDW: 14.2 % (ref 11.5–15.5)
WBC: 14.9 10*3/uL — ABNORMAL HIGH (ref 4.0–10.5)

## 2014-10-11 LAB — GLUCOSE, CAPILLARY
GLUCOSE-CAPILLARY: 202 mg/dL — AB (ref 70–99)
Glucose-Capillary: 186 mg/dL — ABNORMAL HIGH (ref 70–99)
Glucose-Capillary: 159 mg/dL — ABNORMAL HIGH (ref 70–99)
Glucose-Capillary: 175 mg/dL — ABNORMAL HIGH (ref 70–99)
Glucose-Capillary: 142 mg/dL — ABNORMAL HIGH (ref 70–99)
Glucose-Capillary: 156 mg/dL — ABNORMAL HIGH (ref 70–99)
Glucose-Capillary: 169 mg/dL — ABNORMAL HIGH (ref 70–99)

## 2014-10-11 LAB — BASIC METABOLIC PANEL
ANION GAP: 8 (ref 5–15)
BUN: 10 mg/dL (ref 6–20)
CHLORIDE: 102 mmol/L (ref 101–111)
CO2: 28 mmol/L (ref 22–32)
Calcium: 8.4 mg/dL — ABNORMAL LOW (ref 8.9–10.3)
Creatinine, Ser: 0.84 mg/dL (ref 0.44–1.00)
GFR calc Af Amer: >60 mL/min (ref >60)
GFR calc non Af Amer: >60 mL/min (ref >60)
GLUCOSE: 192 mg/dL — AB (ref 70–99)
Potassium: 4.3 mmol/L (ref 3.5–5.1)
SODIUM: 138 mmol/L (ref 135–145)

## 2014-10-11 MED ORDER — LIP MEDEX EX OINT
TOPICAL_OINTMENT | CUTANEOUS | Status: AC
  Filled 2014-10-11: qty 7

## 2014-10-11 MED ORDER — DIPHENHYDRAMINE HCL 12.5 MG/5ML PO ELIX: 12.5000 mg | ORAL_SOLUTION | Freq: Four times a day (QID) | ORAL | Status: DC | PRN

## 2014-10-11 MED ORDER — DIPHENHYDRAMINE HCL 50 MG/ML IJ SOLN: 12.5000 mg | Freq: Four times a day (QID) | INTRAMUSCULAR | Status: DC | PRN

## 2014-10-11 NOTE — Progress Notes (Signed)
1 Day Post-Op  Subjective: Pt doing well, but feeling very tired because she was not able to sleep well last night. She said that the machines were too loud and kept her awake. Pain well controlled with PCA. She states her abdominal muscles feel like they are "contracting". No flatus or BM. Doing well with sips and chips, no N/V.   Objective: Vital signs in last 24 hours: Temp:  [97.6 F (36.4 C)-99.4 F (37.4 C)] 98 F (36.7 C) (05/06 0540) Pulse Rate:  [79-104] 94 (05/06 0540) Resp:  [11-18] 14 (05/06 0540) BP: (119-188)/(78-102) 119/79 mmHg (05/06 0540) SpO2:  [93 %-100 %] 100 % (05/06 0540) Weight:  [223 lb (101.152 kg)] 223 lb (101.152 kg) (05/05 1036) Last BM Date: 10/09/14  Intake/Output from previous day: 05/05 0701 - 05/06 0700 In: 3135.4 [I.V.:3035.4; IV Piggyback:100] Out: 2488 [Urine:2200; Emesis/NG output:50; Drains:138; Blood:100] Intake/Output this shift:    General appearance: alert, cooperative, fatigued and no distress Resp: breathing comfortably, non-labored. Cardio: regular rate and rhythm GI: Soft, non-distended, appropriately tender. Drain with serosanguinous fluid.  Lab Results:   Recent Labs  10/11/14 0440  WBC 14.9*  HGB 13.1  HCT 40.8  PLT 312   BMET  Recent Labs  10/11/14 0440  NA 138  K 4.3  CL 102  CO2 28  GLUCOSE 192*  BUN 10  CREATININE 0.84  CALCIUM 8.4*   PT/INR No results for input(s): LABPROT, INR in the last 72 hours. ABG No results for input(s): PHART, HCO3 in the last 72 hours.  Invalid input(s): PCO2, PO2  Studies/Results: No results found.  Anti-infectives: Anti-infectives    Start     Dose/Rate Route Frequency Ordered Stop   10/10/14 1800  ceFAZolin (ANCEF) IVPB 1 g/50 mL premix     1 g 100 mL/hr over 30 Minutes Intravenous Every 6 hours 10/10/14 1550 10/11/14 0550   10/10/14 1036  ceFAZolin (ANCEF) IVPB 2 g/50 mL premix     2 g 100 mL/hr over 30 Minutes Intravenous On call to O.R. 10/10/14 1036 10/10/14  1206      Assessment/Plan: s/p Procedure(s): EXPLORATORY LAPAROTOMY EXCISION OF 55mm DUODENAL CARCINOID TUMOR (N/A) LYSIS OF ADHESION Hyperglycemia - Continue SSI and monitor glucose.  NG tube - Keep in place without manipulation or flushing. Dr. Zella Richer plans on doing a contrast study on Monday. Add Benadryl prn to help sleep at night.  Mobilize as tolerated. Pulmonary toilet. Plan to d/c foley tomorrow.  LOS: 1 day   [employer], PA-S [facility] [phone] 10/11/2014  7:41 AM

## 2014-10-12 LAB — GLUCOSE, CAPILLARY
GLUCOSE-CAPILLARY: 146 mg/dL — AB (ref 70–99)
GLUCOSE-CAPILLARY: 137 mg/dL — AB (ref 70–99)
GLUCOSE-CAPILLARY: 118 mg/dL — AB (ref 70–99)
Glucose-Capillary: 180 mg/dL — ABNORMAL HIGH (ref 70–99)
Glucose-Capillary: 129 mg/dL — ABNORMAL HIGH (ref 70–99)
Glucose-Capillary: 138 mg/dL — ABNORMAL HIGH (ref 70–99)

## 2014-10-12 NOTE — Progress Notes (Signed)
2 Days Post-Op  Subjective: Pt doing well, but feeling very tired because she was not able to sleep well again last night. Pain well controlled with PCA. No nausea Objective: Vital signs in last 24 hours: Temp:  [98.6 F (37 C)-99 F (37.2 C)] 99 F (37.2 C) (05/07 0529) Pulse Rate:  [74-86] 85 (05/07 0529) Resp:  [11-16] 12 (05/07 0800) BP: (133-161)/(72-79) 161/79 mmHg (05/07 0529) SpO2:  [95 %-100 %] 96 % (05/07 0800) Last BM Date: 10/09/14  Intake/Output from previous day: 05/06 0701 - 05/07 0700 In: 3100 [P.O.:100; I.V.:3000] Out: 2122 [Urine:3200; Emesis/NG output:325; Drains:68] Intake/Output this shift: Total I/O In: 300 [P.O.:300] Out: 480 [Urine:300; Emesis/NG output:150; Drains:30]  General appearance: alert, cooperative, fatigued and no distress Resp: breathing comfortably, non-labored. Cardio: regular rate and rhythm GI: Soft, non-distended, appropriately tender. Drain with serosanguinous fluid.  Lab Results:   Recent Labs  10/11/14 0440  WBC 14.9*  HGB 13.1  HCT 40.8  PLT 312   BMET  Recent Labs  10/11/14 0440  NA 138  K 4.3  CL 102  CO2 28  GLUCOSE 192*  BUN 10  CREATININE 0.84  CALCIUM 8.4*   PT/INR No results for input(s): LABPROT, INR in the last 72 hours. ABG No results for input(s): PHART, HCO3 in the last 72 hours.  Invalid input(s): PCO2, PO2  Studies/Results: No results found.  Anti-infectives: Anti-infectives    Start     Dose/Rate Route Frequency Ordered Stop   10/10/14 1800  ceFAZolin (ANCEF) IVPB 1 g/50 mL premix     1 g 100 mL/hr over 30 Minutes Intravenous Every 6 hours 10/10/14 1550 10/11/14 0550   10/10/14 1036  ceFAZolin (ANCEF) IVPB 2 g/50 mL premix     2 g 100 mL/hr over 30 Minutes Intravenous On call to O.R. 10/10/14 1036 10/10/14 1206      Assessment/Plan: s/p Procedure(s): EXPLORATORY LAPAROTOMY EXCISION OF 81mm DUODENAL CARCINOID TUMOR (N/A) LYSIS OF ADHESION Hyperglycemia - Continue SSI and monitor  glucose.  NG tube - Keep in place without manipulation or flushing. Dr. Zella Richer plans on doing a contrast study on Monday. Cont Benadryl prn to help sleep at night.  Mobilize as tolerated. Pulmonary toilet.  d/c foley    LOS: 2 days   Rosario Adie, MD  Colorectal and General Surgery [facility]   10/12/2014  12:11 PM

## 2014-10-13 LAB — GLUCOSE, CAPILLARY
GLUCOSE-CAPILLARY: 108 mg/dL — AB (ref 70–99)
GLUCOSE-CAPILLARY: 114 mg/dL — AB (ref 70–99)
Glucose-Capillary: 134 mg/dL — ABNORMAL HIGH (ref 70–99)
Glucose-Capillary: 142 mg/dL — ABNORMAL HIGH (ref 70–99)
Glucose-Capillary: 164 mg/dL — ABNORMAL HIGH (ref 70–99)

## 2014-10-13 MED ORDER — PHENOL 1.4 % MT LIQD
1.0000 | OROMUCOSAL | Status: DC | PRN
  Administered 2014-10-13: 1 via OROMUCOSAL
  Filled 2014-10-13: qty 177

## 2014-10-13 NOTE — Progress Notes (Signed)
3 Days Post-Op  Subjective: No complaints. Starting to feel better  Objective: Vital signs in last 24 hours: Temp:  [98.9 F (37.2 C)-99.4 F (37.4 C)] 98.9 F (37.2 C) (05/08 0416) Pulse Rate:  [79-84] 82 (05/08 0416) Resp:  [12-18] 18 (05/08 0416) BP: (109-158)/(61-74) 133/69 mmHg (05/08 0416) SpO2:  [94 %-98 %] 94 % (05/08 0416) Last BM Date: 10/09/14  Intake/Output from previous day: 05/07 0701 - 05/08 0700 In: 2485.8 [P.O.:330; I.V.:2155.8] Out: 2365 [Urine:1700; Emesis/NG output:600; Drains:65] Intake/Output this shift:    Resp: clear to auscultation bilaterally Cardio: regular rate and rhythm GI: soft, mild tenderness. incision looks good. quiet  Lab Results:   Recent Labs  10/11/14 0440  WBC 14.9*  HGB 13.1  HCT 40.8  PLT 312   BMET  Recent Labs  10/11/14 0440  NA 138  K 4.3  CL 102  CO2 28  GLUCOSE 192*  BUN 10  CREATININE 0.84  CALCIUM 8.4*   PT/INR No results for input(s): LABPROT, INR in the last 72 hours. ABG No results for input(s): PHART, HCO3 in the last 72 hours.  Invalid input(s): PCO2, PO2  Studies/Results: No results found.  Anti-infectives: Anti-infectives    Start     Dose/Rate Route Frequency Ordered Stop   10/10/14 1800  ceFAZolin (ANCEF) IVPB 1 g/50 mL premix     1 g 100 mL/hr over 30 Minutes Intravenous Every 6 hours 10/10/14 1550 10/11/14 0550   10/10/14 1036  ceFAZolin (ANCEF) IVPB 2 g/50 mL premix     2 g 100 mL/hr over 30 Minutes Intravenous On call to O.R. 10/10/14 1036 10/10/14 1206      Assessment/Plan: s/p Procedure(s): EXPLORATORY LAPAROTOMY EXCISION OF 54mm DUODENAL CARCINOID TUMOR (N/A) LYSIS OF ADHESION continue ng and bowel rest  Plan for UGI study tomorrow ambulate  LOS: 3 days    TOTH III,Tracy Garrett S 10/13/2014

## 2014-10-14 ENCOUNTER — Inpatient Hospital Stay (HOSPITAL_COMMUNITY): Payer: 59

## 2014-10-14 LAB — GLUCOSE, CAPILLARY
GLUCOSE-CAPILLARY: 111 mg/dL — AB (ref 70–99)
GLUCOSE-CAPILLARY: 116 mg/dL — AB (ref 70–99)
GLUCOSE-CAPILLARY: 144 mg/dL — AB (ref 70–99)
Glucose-Capillary: 126 mg/dL — ABNORMAL HIGH (ref 70–99)
Glucose-Capillary: 145 mg/dL — ABNORMAL HIGH (ref 70–99)
Glucose-Capillary: 157 mg/dL — ABNORMAL HIGH (ref 70–99)
Glucose-Capillary: 159 mg/dL — ABNORMAL HIGH (ref 70–99)
Glucose-Capillary: 205 mg/dL — ABNORMAL HIGH (ref 70–99)

## 2014-10-14 MED ORDER — IOHEXOL 300 MG/ML  SOLN
150.0000 mL | Freq: Once | INTRAMUSCULAR | Status: AC | PRN
  Administered 2014-10-14: 300 mL via INTRAVENOUS

## 2014-10-14 NOTE — Progress Notes (Signed)
4 Days Post-Op  Subjective: No flatus or BM.  Pain well controlled.  Objective: Vital signs in last 24 hours: Temp:  [98.7 F (37.1 C)-99.4 F (37.4 C)] 99.4 F (37.4 C) (05/09 0442) Pulse Rate:  [81-91] 83 (05/09 0442) Resp:  [16-18] 18 (05/09 0442) BP: (115-131)/(66-99) 126/76 mmHg (05/09 0442) SpO2:  [97 %-100 %] 97 % (05/09 0442) Last BM Date: 10/09/14  Intake/Output from previous day: 05/08 0701 - 05/09 0700 In: 1801 [I.V.:1800] Out: 2650 [Urine:1550; Emesis/NG output:1050; Drains:50] Intake/Output this shift:    PE: General- In NAD Abdomen-soft, quiet, incision is clean and intact, serous drain output  Lab Results:  No results for input(s): WBC, HGB, HCT, PLT in the last 72 hours. BMET No results for input(s): NA, K, CL, CO2, GLUCOSE, BUN, CREATININE, CALCIUM in the last 72 hours. PT/INR No results for input(s): LABPROT, INR in the last 72 hours. Comprehensive Metabolic Panel:    Component Value Date/Time   NA 138 10/11/2014 0440   NA 139 10/04/2014 0845   K 4.3 10/11/2014 0440   K 4.0 10/04/2014 0845   CL 102 10/11/2014 0440   CL 102 10/04/2014 0845   CO2 28 10/11/2014 0440   CO2 28 10/04/2014 0845   BUN 10 10/11/2014 0440   BUN 17 10/04/2014 0845   CREATININE 0.84 10/11/2014 0440   CREATININE 0.71 10/04/2014 0845   CREATININE 0.81 09/02/2014 1212   CREATININE 0.81 11/22/2013 1031   GLUCOSE 192* 10/11/2014 0440   GLUCOSE 158* 10/04/2014 0845   CALCIUM 8.4* 10/11/2014 0440   CALCIUM 9.1 10/04/2014 0845   AST 20 10/04/2014 0845   AST 14 09/02/2014 1212   ALT 22 10/04/2014 0845   ALT 17 09/02/2014 1212   ALKPHOS 78 10/04/2014 0845   ALKPHOS 76 09/02/2014 1212   BILITOT 1.0 10/04/2014 0845   BILITOT 0.5 09/02/2014 1212   PROT 7.3 10/04/2014 0845   PROT 7.1 09/02/2014 1212   ALBUMIN 4.0 10/04/2014 0845   ALBUMIN 4.3 09/02/2014 1212     Studies/Results: No results found.  Anti-infectives: Anti-infectives    Start     Dose/Rate Route Frequency  Ordered Stop   10/10/14 1800  ceFAZolin (ANCEF) IVPB 1 g/50 mL premix     1 g 100 mL/hr over 30 Minutes Intravenous Every 6 hours 10/10/14 1550 10/11/14 0550   10/10/14 1036  ceFAZolin (ANCEF) IVPB 2 g/50 mL premix     2 g 100 mL/hr over 30 Minutes Intravenous On call to O.R. 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   Rani Idler J 10/14/2014

## 2014-10-14 NOTE — Progress Notes (Signed)
UGI shows some delayed gastric emptying but no leak.  Will clamp ng tube and give a trial of clear liquids.

## 2014-10-15 LAB — GLUCOSE, CAPILLARY
GLUCOSE-CAPILLARY: 104 mg/dL — AB (ref 70–99)
GLUCOSE-CAPILLARY: 127 mg/dL — AB (ref 70–99)
GLUCOSE-CAPILLARY: 142 mg/dL — AB (ref 70–99)
Glucose-Capillary: 146 mg/dL — ABNORMAL HIGH (ref 70–99)
Glucose-Capillary: 213 mg/dL — ABNORMAL HIGH (ref 70–99)
Glucose-Capillary: 196 mg/dL — ABNORMAL HIGH (ref 70–99)

## 2014-10-15 MED ORDER — OXYCODONE HCL 5 MG PO TABS
5.0000 mg | ORAL_TABLET | ORAL | Status: DC | PRN
  Administered 2014-10-16: 10 mg via ORAL
  Filled 2014-10-15: qty 2

## 2014-10-15 MED ORDER — ONDANSETRON HCL 4 MG/2ML IJ SOLN: 4.0000 mg | INTRAMUSCULAR | Status: DC | PRN

## 2014-10-15 MED ORDER — BISACODYL 10 MG RE SUPP
10.0000 mg | Freq: Once | RECTAL | Status: DC
Start: 2014-10-15 — End: 2014-10-16

## 2014-10-15 MED ORDER — MORPHINE SULFATE 2 MG/ML IJ SOLN
2.0000 mg | INTRAMUSCULAR | Status: DC | PRN
  Administered 2014-10-15 (×2): 2 mg via INTRAVENOUS
  Filled 2014-10-15 (×2): qty 1

## 2014-10-15 NOTE — Progress Notes (Signed)
5 Days Post-Op  Subjective: No flatus or BM. Tolerated ng tube clamping and clear liquids.  Objective: Vital signs in last 24 hours: Temp:  [98.5 F (36.9 C)-99.9 F (37.7 C)] 98.5 F (36.9 C) (05/10 0457) Pulse Rate:  [83-100] 83 (05/10 0457) Resp:  [10-20] 14 (05/10 0737) BP: (100-123)/(55-79) 100/79 mmHg (05/10 0457) SpO2:  [95 %-99 %] 96 % (05/10 0737) FiO2 (%):  [36 %-49 %] 40 % (05/10 0503) Last BM Date: 10/09/14  Intake/Output from previous day: 05/09 0701 - 05/10 0700 In: 2160 [P.O.:360; I.V.:1800] Out: 1315 [Urine:850; Emesis/NG output:400; Drains:65] Intake/Output this shift:    PE: General- In NAD Abdomen-soft, few bowel sounds present this AM, incision is clean and intact, serous drain output  Lab Results:  No results for input(s): WBC, HGB, HCT, PLT in the last 72 hours. BMET No results for input(s): NA, K, CL, CO2, GLUCOSE, BUN, CREATININE, CALCIUM in the last 72 hours. PT/INR No results for input(s): LABPROT, INR in the last 72 hours. Comprehensive Metabolic Panel:    Component Value Date/Time   NA 138 10/11/2014 0440   NA 139 10/04/2014 0845   K 4.3 10/11/2014 0440   K 4.0 10/04/2014 0845   CL 102 10/11/2014 0440   CL 102 10/04/2014 0845   CO2 28 10/11/2014 0440   CO2 28 10/04/2014 0845   BUN 10 10/11/2014 0440   BUN 17 10/04/2014 0845   CREATININE 0.84 10/11/2014 0440   CREATININE 0.71 10/04/2014 0845   CREATININE 0.81 09/02/2014 1212   CREATININE 0.81 11/22/2013 1031   GLUCOSE 192* 10/11/2014 0440   GLUCOSE 158* 10/04/2014 0845   CALCIUM 8.4* 10/11/2014 0440   CALCIUM 9.1 10/04/2014 0845   AST 20 10/04/2014 0845   AST 14 09/02/2014 1212   ALT 22 10/04/2014 0845   ALT 17 09/02/2014 1212   ALKPHOS 78 10/04/2014 0845   ALKPHOS 76 09/02/2014 1212   BILITOT 1.0 10/04/2014 0845   BILITOT 0.5 09/02/2014 1212   PROT 7.3 10/04/2014 0845   PROT 7.1 09/02/2014 1212   ALBUMIN 4.0 10/04/2014 0845   ALBUMIN 4.3 09/02/2014 1212      Studies/Results: Dg Ugi W/water Sol Cm  10/14/2014   CLINICAL DATA:  Postop from resection of duodenal bulb carcinoid tumor. Postop nausea and vomiting.  EXAM: WATER SOLUBLE UPPER GI SERIES  TECHNIQUE: Single-column upper GI series was performed using water soluble contrast.  CONTRAST:  348mL OMNIPAQUE IOHEXOL 300 MG/ML  SOLN  COMPARISON:  None.  FLUOROSCOPY TIME:  Fluoroscopy Time (in minutes and seconds): 3 minutes 7 seconds  Number of Acquired Images:  13  FINDINGS: Scout radiograph: A nasogastric tube is seen with tip in the gastric antrum. Surgical drain seen in the right abdomen and midline abdominal skin staples noted. Right upper quadrant surgical clips also noted from prior cholecystectomy.  Esophagus:  No evidence of esophageal mass or stricture.  Stomach: No hiatal hernia visualized. No evidence of gastric mass or ulcer.  Duodenum: Slow gastric emptying is noted, without evidence of gastric outlet obstruction. This is most consistent with postop ileus. There is no evidence of contrast leak or extravasation from the duodenum. No evidence of duodenal obstruction or dilatation.  Other:  None.  IMPRESSION: No evidence of postop contrast leak or extravasation. No evidence of duodenal obstruction or dilatation.  Slow gastric emptying noted, likely due to postop ileus.   Electronically Signed   By: Earle Gell M.D.   On: 10/14/2014 11:48    Anti-infectives: Anti-infectives  Start     Dose/Rate Route Frequency Ordered Stop   10/10/14 1800  ceFAZolin (ANCEF) IVPB 1 g/50 mL premix     1 g 100 mL/hr over 30 Minutes Intravenous Every 6 hours 10/10/14 1550 10/11/14 0550   10/10/14 1036  ceFAZolin (ANCEF) IVPB 2 g/50 mL premix     2 g 100 mL/hr over 30 Minutes Intravenous On call to O.R. 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  Dulcolax suppository.  Advance to full liquids.  Change to oral analgesic.   Uriel Dowding J 10/15/2014

## 2014-10-16 LAB — GLUCOSE, CAPILLARY
GLUCOSE-CAPILLARY: 136 mg/dL — AB (ref 70–99)
GLUCOSE-CAPILLARY: 169 mg/dL — AB (ref 70–99)
Glucose-Capillary: 133 mg/dL — ABNORMAL HIGH (ref 70–99)

## 2014-10-16 MED ORDER — PANTOPRAZOLE SODIUM 40 MG PO TBEC: 40.0000 mg | DELAYED_RELEASE_TABLET | Freq: Every day | ORAL | Status: DC

## 2014-10-16 MED ORDER — OXYCODONE-ACETAMINOPHEN 7.5-325 MG PO TABS: 1.0000 | ORAL_TABLET | ORAL | Status: DC | PRN

## 2014-10-16 NOTE — Discharge Instructions (Signed)
[facility], [place] [id number]  OPEN ABDOMINAL SURGERY: POST OP INSTRUCTIONS  Always review your discharge instruction sheet given to you by the facility where your surgery was performed.  IF YOU HAVE DISABILITY OR FAMILY LEAVE FORMS, YOU MUST BRING THEM TO THE OFFICE FOR PROCESSING.  PLEASE DO NOT GIVE THEM TO YOUR DOCTOR.  1. A prescription for pain medication may be given to you upon discharge.  Take your pain medication as prescribed, if needed.  If narcotic pain medicine is not needed, then you may take acetaminophen (Tylenol) or ibuprofen (Advil) as needed. 2. Take your usually prescribed medications unless otherwise directed. 3. If you need a refill on your pain medication, please contact your pharmacy. They will contact our office to request authorization.  Prescriptions will not be filled after 5pm or on week-ends. 4. You should follow a soft diet.  Do not overeat.  Be sure to include lots of fluids daily. 5. Most patients will experience some swelling and bruising in the area of the incision. Ice pack will help. Swelling and bruising can take several days to resolve..  6. It is common to experience some constipation if taking pain medication after surgery.  Increasing fluid intake and taking a stool softener will usually help or prevent this problem from occurring.  A mild laxative (Milk of Magnesia or Miralax) should be taken according to package directions if there are no bowel movements after 48 hours. 7.  You may have steri-strips (small skin tapes) in place directly over the incision.  These strips should be left on the skin.  If your surgeon used skin glue on the incision, you may shower in 24 hours.  The glue will flake off over the next 2-3 weeks.  Any sutures or staples will be removed at the office during your follow-up visit. You may find that a light gauze bandage over your incision may keep your staples from being rubbed or pulled. You may shower and replace  the bandage daily. 8. ACTIVITIES:  You may resume regular (light) daily activities beginning the next day--such as daily self-care, walking, climbing stairs--gradually increasing activities as tolerated.  You may have sexual intercourse when it is comfortable.  Refrain from any heavy lifting or straining-nothing over 10 pounds for 6-8 weeks. a. You may drive when you no longer are taking prescription pain medication, you can comfortably wear a seatbelt, and you can safely maneuver your car and apply brakes b. Return to Work: ___________________________________ 20. You should see your doctor in the office for a follow-up appointment approximately two weeks after your surgery.  Make sure that you call for this appointment within a day or two after you arrive home to insure a convenient appointment time. OTHER INSTRUCTIONS:  ___Change drain site dressing daily until small wound heals.__________________________________________________________ _____________________________________________________________  WHEN TO CALL YOUR DOCTOR: 1. Fever over 101.0 2. Inability to urinate 3. Nausea and/or vomiting 4. Extreme swelling or bruising 5. Continued bleeding from incision. 6. Increased pain, redness, or drainage from the incision. The clinic staff is available to answer your questions during regular business hours.  Please dont hesitate to call and ask to speak to one of the nurses if you have concerns.  For further questions, please visit [email or web address]

## 2014-10-16 NOTE — Progress Notes (Signed)
6 Days Post-Op  Subjective: Bowels moved without suppository. Passing some gas. Tolerating full liquid diet.  Objective: Vital signs in last 24 hours: Temp:  [98.8 F (37.1 C)-100.1 F (37.8 C)] 98.8 F (37.1 C) (05/11 0459) Pulse Rate:  [81-94] 81 (05/11 0459) Resp:  [14-16] 16 (05/11 0459) BP: (127-140)/(66-75) 128/75 mmHg (05/11 0459) SpO2:  [95 %-100 %] 100 % (05/11 0459) Last BM Date: 10/15/14  Intake/Output from previous day: 05/10 0701 - 05/11 0700 In: 2400 [P.O.:600; I.V.:1800] Out: 1522 [Urine:1450; Drains:70; Stool:2] Intake/Output this shift:    PE: General- In NAD Abdomen-soft, incision is clean and intact, serous drain output-drain removed.  Lab Results:  No results for input(s): WBC, HGB, HCT, PLT in the last 72 hours. BMET No results for input(s): NA, K, CL, CO2, GLUCOSE, BUN, CREATININE, CALCIUM in the last 72 hours. PT/INR No results for input(s): LABPROT, INR in the last 72 hours. Comprehensive Metabolic Panel:    Component Value Date/Time   NA 138 10/11/2014 0440   NA 139 10/04/2014 0845   K 4.3 10/11/2014 0440   K 4.0 10/04/2014 0845   CL 102 10/11/2014 0440   CL 102 10/04/2014 0845   CO2 28 10/11/2014 0440   CO2 28 10/04/2014 0845   BUN 10 10/11/2014 0440   BUN 17 10/04/2014 0845   CREATININE 0.84 10/11/2014 0440   CREATININE 0.71 10/04/2014 0845   CREATININE 0.81 09/02/2014 1212   CREATININE 0.81 11/22/2013 1031   GLUCOSE 192* 10/11/2014 0440   GLUCOSE 158* 10/04/2014 0845   CALCIUM 8.4* 10/11/2014 0440   CALCIUM 9.1 10/04/2014 0845   AST 20 10/04/2014 0845   AST 14 09/02/2014 1212   ALT 22 10/04/2014 0845   ALT 17 09/02/2014 1212   ALKPHOS 78 10/04/2014 0845   ALKPHOS 76 09/02/2014 1212   BILITOT 1.0 10/04/2014 0845   BILITOT 0.5 09/02/2014 1212   PROT 7.3 10/04/2014 0845   PROT 7.1 09/02/2014 1212   ALBUMIN 4.0 10/04/2014 0845   ALBUMIN 4.3 09/02/2014 1212     Studies/Results: Dg Ugi W/water Sol Cm  10/14/2014   CLINICAL  DATA:  Postop from resection of duodenal bulb carcinoid tumor. Postop nausea and vomiting.  EXAM: WATER SOLUBLE UPPER GI SERIES  TECHNIQUE: Single-column upper GI series was performed using water soluble contrast.  CONTRAST:  376mL OMNIPAQUE IOHEXOL 300 MG/ML  SOLN  COMPARISON:  None.  FLUOROSCOPY TIME:  Fluoroscopy Time (in minutes and seconds): 3 minutes 7 seconds  Number of Acquired Images:  13  FINDINGS: Scout radiograph: A nasogastric tube is seen with tip in the gastric antrum. Surgical drain seen in the right abdomen and midline abdominal skin staples noted. Right upper quadrant surgical clips also noted from prior cholecystectomy.  Esophagus:  No evidence of esophageal mass or stricture.  Stomach: No hiatal hernia visualized. No evidence of gastric mass or ulcer.  Duodenum: Slow gastric emptying is noted, without evidence of gastric outlet obstruction. This is most consistent with postop ileus. There is no evidence of contrast leak or extravasation from the duodenum. No evidence of duodenal obstruction or dilatation.  Other:  None.  IMPRESSION: No evidence of postop contrast leak or extravasation. No evidence of duodenal obstruction or dilatation.  Slow gastric emptying noted, likely due to postop ileus.   Electronically Signed   By: Earle Gell M.D.   On: 10/14/2014 11:48    Anti-infectives: Anti-infectives    Start     Dose/Rate Route Frequency Ordered Stop   10/10/14 1800  ceFAZolin (ANCEF) IVPB 1 g/50 mL premix     1 g 100 mL/hr over 30 Minutes Intravenous Every 6 hours 10/10/14 1550 10/11/14 0550   10/10/14 1036  ceFAZolin (ANCEF) IVPB 2 g/50 mL premix     2 g 100 mL/hr over 30 Minutes Intravenous On call to O.R. 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  Remove staples.  If diet tolerated, will discharge after lunch.  Discharge instructions given to her.   Evonte Prestage J 10/16/2014

## 2014-10-17 NOTE — Discharge Summary (Signed)
Physician Discharge Summary  Patient ID: Tracy Garrett MRN: [id number] DOB/AGE: [birth year] 59 y.o.  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 Final pathology returned low-grade carcinoid tumor with clear margins. She had a postoperative ileus and a nasogastric tube was left in. On the fourth postoperative day, an upper GI study was performed which demonstrated no leak where the duodenum was closed. It also demonstrated slightly delayed gastric emptying. The NG tube was clamped that day and she was started on clear liquids which she tolerated. The next day the ng tube was removed and she was advanced to full liquids which she tolerated. Her 6 postoperative day her drain was removed and she was advanced to a solid diet which she tolerated. Her staples were removed. She was moving her bowels and the ileus had resolved. She was felt to be ready for discharge. Discharge instructions were given to her.  Consults: None  Discharge Exam: Blood pressure 128/75, pulse 81, temperature 98.8 F (37.1 C), temperature source Oral, resp. rate 16, height 5\' 3"  (1.6 m), weight 101.152 kg (223 lb), SpO2 100 %.   Disposition: 01-Home or Self Care     Medication List    TAKE these medications        acetaminophen 500 MG tablet  Commonly known as:  TYLENOL  Take 1,000 mg by mouth every 6 (six) hours as needed for mild pain.     atorvastatin 80 MG tablet  Commonly known as:  LIPITOR  Take 1/2 to 1 tablet daily or as directed  For Cholesterol     diphenhydrAMINE 25 mg capsule  Commonly known as:  BENADRYL  Take 25 mg by mouth every 6 (six) hours as needed for itching.     fexofenadine 180 MG tablet  Commonly known as:  ALLEGRA  Take 180 mg by mouth daily as needed for allergies.     hydrocortisone cream 1 %  Apply 1 application topically 3 (three) times daily as needed for itching.     metFORMIN 500 MG 24 hr tablet  Commonly known as:  GLUCOPHAGE XR  Take 1 tablet 2 x day after Breakfast &Lunch and 2 tablets after supper for Diabetes     OMEGA 3 PO  Take 1 capsule by mouth 2 (two) times daily.     oxyCODONE-acetaminophen 7.5-325 MG per tablet  Commonly known as:  PERCOCET  Take 1 tablet by mouth every 4 (four) hours as needed for severe pain.     pantoprazole 40 MG tablet  Commonly known as:  PROTONIX  Take 1 tablet (40 mg total) by mouth daily.     phentermine 37.5 MG tablet  Commonly known as:  ADIPEX-P  Take 1/2 to 1 tablet each morning to help with weight loss     Vitamin D3 5000 UNITS Tabs  Take 1 tablet by mouth every other day.         Signed: [name] 10/17/2014, 10:12 AM

## 2014-11-20 ENCOUNTER — Other Ambulatory Visit: Payer: Self-pay | Admitting: Internal Medicine

## 2014-12-19 ENCOUNTER — Ambulatory Visit: Payer: Self-pay | Admitting: Internal Medicine

## 2014-12-21 ENCOUNTER — Other Ambulatory Visit: Payer: Self-pay | Admitting: Physician Assistant

## 2014-12-31 ENCOUNTER — Ambulatory Visit: Payer: Self-pay | Admitting: Internal Medicine

## 2015-01-07 ENCOUNTER — Ambulatory Visit: Payer: Self-pay | Admitting: Internal Medicine

## 2015-04-09 ENCOUNTER — Ambulatory Visit: Payer: Self-pay | Admitting: Internal Medicine

## 2015-05-21 ENCOUNTER — Ambulatory Visit (INDEPENDENT_AMBULATORY_CARE_PROVIDER_SITE_OTHER): Payer: 59 | Admitting: Physician Assistant

## 2015-05-21 VITALS — BP 114/76 | HR 84 | Temp 97.5°F | Resp 16 | Ht 62.75 in | Wt 213.2 lb

## 2015-05-21 DIAGNOSIS — E559 Vitamin D deficiency, unspecified: Secondary | ICD-10-CM

## 2015-05-21 DIAGNOSIS — I10 Essential (primary) hypertension: Secondary | ICD-10-CM

## 2015-05-21 DIAGNOSIS — M791 Myalgia, unspecified site: Secondary | ICD-10-CM

## 2015-05-21 DIAGNOSIS — E119 Type 2 diabetes mellitus without complications: Secondary | ICD-10-CM | POA: Diagnosis not present

## 2015-05-21 DIAGNOSIS — Z79899 Other long term (current) drug therapy: Secondary | ICD-10-CM | POA: Diagnosis not present

## 2015-05-21 DIAGNOSIS — D3A010 Benign carcinoid tumor of the duodenum: Secondary | ICD-10-CM

## 2015-05-21 DIAGNOSIS — E785 Hyperlipidemia, unspecified: Secondary | ICD-10-CM

## 2015-05-21 LAB — CBC WITH DIFFERENTIAL/PLATELET
Basophils Absolute: 0 10*3/uL (ref 0.0–0.1)
Basophils Relative: 0 % (ref 0–1)
Eosinophils Absolute: 0.2 10*3/uL (ref 0.0–0.7)
Eosinophils Relative: 3 % (ref 0–5)
HEMATOCRIT: 39.1 % (ref 36.0–46.0)
HEMOGLOBIN: 13.4 g/dL (ref 12.0–15.0)
LYMPHS ABS: 2.9 10*3/uL (ref 0.7–4.0)
LYMPHS PCT: 35 % (ref 12–46)
MCH: 29.8 pg (ref 26.0–34.0)
MCHC: 34.3 g/dL (ref 30.0–36.0)
MCV: 86.9 fL (ref 78.0–100.0)
MONOS PCT: 9 % (ref 3–12)
MPV: 9.7 fL (ref 8.6–12.4)
Monocytes Absolute: 0.7 10*3/uL (ref 0.1–1.0)
NEUTROS ABS: 4.3 10*3/uL (ref 1.7–7.7)
NEUTROS PCT: 53 % (ref 43–77)
Platelets: 290 10*3/uL (ref 150–400)
RBC: 4.5 MIL/uL (ref 3.87–5.11)
RDW: 15.3 % (ref 11.5–15.5)
WBC: 8.2 10*3/uL (ref 4.0–10.5)

## 2015-05-21 LAB — VITAMIN B12: VITAMIN B 12: 394 pg/mL (ref 211–911)

## 2015-05-21 LAB — FERRITIN: FERRITIN: 91 ng/mL (ref 10–291)

## 2015-05-21 LAB — TSH: TSH: 1.344 u[IU]/mL (ref 0.350–4.500)

## 2015-05-21 MED ORDER — GABAPENTIN 100 MG PO CAPS: ORAL_CAPSULE | ORAL | Status: DC

## 2015-05-21 NOTE — Progress Notes (Signed)
Assessment and Plan:  1. Hypertension -Continue medication, monitor blood pressure at home. Continue DASH diet.  Reminder to go to the ER if any CP, SOB, nausea, dizziness, severe HA, changes vision/speech, left arm numbness and tingling and jaw pain.  2. Cholesterol -Continue diet and exercise. Check cholesterol.  - may need to start lipitor pending labs, never started  3. 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 Check A1C - try to increase metformin to 4 pills a day - leg pain myalgias versus neuropathy- will start on gabapentin 158m TID - add bASA  4. Vitamin D Def - check level and continue medications.   5. Obesity with co morbidities - long discussion about weight loss, diet, and exercise  6. Myalgias -  increase fluids, will check CPK, aldolase, TSH, ESR, magnesium, potassium,    Continue diet and meds as discussed. Further disposition pending results of labs. Over 30 minutes of exam, counseling, chart review, and critical decision making was performed  HPI 59y.o. female  presents for 3 month follow up on hypertension, cholesterol, prediabetes, and vitamin D deficiency.   Her blood pressure has been controlled at home, today their BP is BP: 114/76 mmHg  She does not workout. She denies chest pain, shortness of breath, dizziness.  She is not on cholesterol medication, never started on lipitor. Her cholesterol is not at goal. The cholesterol last visit was:   Lab Results  Component Value Date   CHOL 242* 09/02/2014   HDL 47 09/02/2014   LDLCALC 160* 09/02/2014   TRIG 173* 09/02/2014   CHOLHDL 5.1 09/02/2014    She has been working on diet and exercise for diabetes without complications, not on ACE due to hypotension, on metformin 2 at night, not on bASA , not checking sugars, and denies polydipsia, polyuria and visual disturbances. Last A1C in the office was:  Lab Results  Component Value Date   HGBA1C 9.1* 09/02/2014   Lab Results   Component Value Date   GFRAA >60 10/11/2014   Patient is on Vitamin D supplement.   Lab Results  Component Value Date   VD25OH 42703/28/2016     Father passed in thanksgiving Had surgery for low grade carcinoid tumor resection with Dr. rZella Richerin May, has follow up next month. Has been doing well without complications.  She has leg pain at night off and on for months, worse, worse left leg calves to thighs, warm feeling and achy, has but heating pad on it with some help, now both legs. Will have some radiation left leg down her thigh to the calves. She also states that she had right ankle sprain in Oct, did not take dicolfenac.  Current Medications:  Current Outpatient Prescriptions on File Prior to Visit  Medication Sig Dispense Refill  . acetaminophen (TYLENOL) 500 MG tablet Take 1,000 mg by mouth every 6 (six) hours as needed for mild pain.    . Cholecalciferol (VITAMIN D3) 5000 UNITS TABS Take 1 tablet by mouth every other day.     . diphenhydrAMINE (BENADRYL) 25 mg capsule Take 25 mg by mouth every 6 (six) hours as needed for itching.    . fexofenadine (ALLEGRA) 180 MG tablet Take 180 mg by mouth daily as needed for allergies.     . hydrocortisone cream 1 % Apply 1 application topically 3 (three) times daily as needed for itching.    . Omega-3 Fatty Acids (OMEGA 3 PO) Take 1 capsule by mouth 2 (two) times daily.    .Marland Kitchen  oxyCODONE-acetaminophen (PERCOCET) 7.5-325 MG per tablet Take 1 tablet by mouth every 4 (four) hours as needed for severe pain. 40 tablet 0  . pantoprazole (PROTONIX) 40 MG tablet TAKE 1 TABLET EVERY DAY 42 tablet 0  . atorvastatin (LIPITOR) 80 MG tablet Take 1/2 to 1 tablet daily or as directed  For Cholesterol (Patient not taking: Reported on 05/21/2015) 30 tablet 5  . phentermine (ADIPEX-P) 37.5 MG tablet Take 1/2 to 1 tablet each morning to help with weight loss 30 tablet 5   No current facility-administered medications on file prior to visit.   Medical History:   Past Medical History  Diagnosis Date  . Allergy   . Hyperlipidemia   . History of kidney stones     x2 episodes  . PONV (postoperative nausea and vomiting)   . Sleep apnea     test was dx, as mild- no further tx. required  . Diabetes mellitus without complication   . Type II or unspecified type diabetes mellitus without mention of complication, not stated as uncontrolled   . Cataracts, bilateral    Allergies:  Allergies  Allergen Reactions  . Other     NUTS CAUSE MOUTH TO [place]  . Hydromorphone Rash    Arm reddened at IV med site    Review of Systems:  Review of Systems  Constitutional: Negative.   HENT: Negative.   Eyes: Negative.   Respiratory: Negative.   Cardiovascular: Negative.   Gastrointestinal: Negative.   Genitourinary: Negative.   Musculoskeletal: Positive for myalgias and joint pain. Negative for back pain, falls and neck pain.  Skin: Negative.   Neurological: Negative.   Endo/Heme/Allergies: Negative.   Psychiatric/Behavioral: Negative.     Family history- Review and unchanged Social history- Review and unchanged Physical Exam: BP 114/76 mmHg  Pulse 84  Temp(Src) 97.5 F (36.4 C)  Resp 16  Ht 5' 2.75" (1.594 m)  Wt 213 lb 3.2 oz (96.707 kg)  BMI 38.06 kg/m2 Wt Readings from Last 3 Encounters:  05/21/15 213 lb 3.2 oz (96.707 kg)  10/10/14 223 lb (101.152 kg)  10/04/14 223 lb (101.152 kg)   General Appearance: Well nourished, in no apparent distress. Eyes: PERRLA, EOMs, conjunctiva no swelling or erythema Sinuses: No Frontal/maxillary tenderness ENT/Mouth: Ext aud canals clear, TMs without erythema, bulging. No erythema, swelling, or exudate on post pharynx.  Tonsils not swollen or erythematous. Hearing normal.  Neck: Supple, thyroid normal.  Respiratory: Respiratory effort normal, BS equal bilaterally without rales, rhonchi, wheezing or stridor.  Cardio: RRR with no MRGs. Brisk peripheral pulses without edema.  Abdomen: Soft, + BS,  Non tender,  no guarding, rebound, hernias, masses. Lymphatics: Non tender without lymphadenopathy.  Musculoskeletal: Full ROM, 5/5 strength, Normal gait. Negative straight leg raise.  Skin: Warm, dry without rashes, lesions, ecchymosis.  Neuro: Cranial nerves intact. Normal muscle tone, no cerebellar symptoms. Decrease sensation bilateral feet with bilateral bunions, no ulcers.  Psych: Awake and oriented X 3, normal affect, Insight and Judgment appropriate.    Vicie Mutters, PA-C 9:53 AM [facility]

## 2015-05-21 NOTE — Patient Instructions (Addendum)
We are starting you on Metformin to prevent or treat diabetes. Metformin does not cause low blood sugars. In order to create energy your cells need insulin and sugar but sometime your cells do not accept the insulin and this can cause increased sugars and decreased energy. The Metformin helps your cells accept insulin and the sugar to give you more energy.   The two most common side effects are nausea and diarrhea, follow these rules to avoid it! You can take imodium per box instructions when starting metformin if needed.   Rules of metformin: 1) start out slow with only one pill daily. Our goal for you is 4 pills a day or 2000mg  total.  2) take with your largest meal. 3) Take with least amount of carbs.   Call if you have any problems.   Add ENTERIC COATED low dose 81 mg Aspirin daily OR can do every other day if you have easy bruising to protect your heart and head. As well as to reduce risk of Colon Cancer by 20 %, Skin Cancer by 26 % , Melanoma by 46% and Pancreatic cancer by 60%  Diabetes is a very complicated disease...lets simplify it.  An easy way to look at it to understand the complications is if you think of the extra sugar floating in your blood stream as glass shards floating through your blood stream.    Diabetes affects your small vessels first: 1) The glass shards (sugar) scraps down the tiny blood vessels in your eyes and lead to diabetic retinopathy, the leading cause of blindness in the [place]. Diabetes is the leading cause of newly diagnosed adult (11 to 59 years of age) blindness in the [place].  2) The glass shards scratches down the tiny vessels of your legs leading to nerve damage called neuropathy and can lead to amputations of your feet. More than 60% of all non-traumatic amputations of lower limbs occur in people with diabetes.  3) Over time the small vessels in your brain are shredded and closed off, individually this does not cause any problems but over a long  period of time many of the small vessels being blocked can lead to Vascular Dementia.   4) Your kidney's are a filter system and have a "net" that keeps certain things in the body and lets bad things out. Sugar shreds this net and leads to kidney damage and eventually failure. Decreasing the sugar that is destroying the net and certain blood pressure medications can help stop or decrease progression of kidney disease. Diabetes was the primary cause of kidney failure in 44 percent of all new cases in 2011.  5) Diabetes also destroys the small vessels in your penis that lead to erectile dysfunction. Eventually the vessels are so damaged that you may not be responsive to cialis or viagra.   Diabetes and your large vessels: Your larger vessels consist of your coronary arteries in your heart and the carotid vessels to your brain. Diabetes or even increased sugars put you at 300% increased risk of heart attack and stroke and this is why.. The sugar scrapes down your large blood vessels and your body sees this as an internal injury and tries to repair itself. Just like you get a scab on your skin, your platelets will stick to the blood vessel wall trying to heal it. This is why we have diabetics on low dose aspirin daily, this prevents the platelets from sticking and can prevent plaque formation. In addition, your body takes  cholesterol and tries to shove it into the open wound. This is why we want your LDL, or bad cholesterol, below 70.   The combination of platelets and cholesterol over 5-10 years forms plaque that can break off and cause a heart attack or stroke.   PLEASE REMEMBER:  Diabetes is preventable! Up to 1 percent of complications and morbidities among individuals with type 2 diabetes can be prevented, delayed, or effectively treated and minimized with regular visits to a health professional, appropriate monitoring and medication, and a healthy diet and lifestyle.     Bad carbs also include  fruit juice, alcohol, and sweet tea. These are empty calories that do not signal to your brain that you are full.   Please remember the good carbs are still carbs which convert into sugar. So please measure them out no more than 1/2-1 cup of rice, oatmeal, pasta, and beans  Veggies are however free foods! Pile them on.   Not all fruit is created equal. Please see the list below, the fruit at the bottom is higher in sugars than the fruit at the top. Please avoid all dried fruits.     Tarsal Tunnel Syndrome With Rehab Tarsal tunnel syndrome is a condition that involves pressure (compression) on the nerve in the ankle (posterior tibial nerve) and results in pain and loss of feeling on the bottom of the foot. The nerve is usually compressed by other structures within the ankle. SYMPTOMS   Signs of nerve damage: pain, numbness, tingling, and loss of feeling along the bottom of the foot.  Pain that worsens with activity.  Feeling a lack of stability in the ankle. CAUSES  Tarsal tunnel syndrome is caused by structures within the ankle placing pressure on the nerve inside the ankle, which causes sensations in the bottom of the foot. Common sources of pressure include:  Ligament-like tissue (retinaculum) that covers the nerve area in the ankle (tarsal tunnel).  Bony spurs or bumps.  Inflamed tendons (tendonitis). RISK INCREASES WITH:  Stretched ankle ligaments, which create a loose joint.  Flat feet.  Arthritis of the ankle.  Inflammation of tendons in the foot and ankle.  Previous foot or ankle injury. PREVENTION  Warm up and stretch properly before activity.  Maintain physical fitness:  Strength, flexibility, and endurance.  Cardiovascular fitness (increases heart rate).  Wear properly fitted shoes.  Wear arch supports (orthotics), if you have flat feet.  Protect the ankle with taping, braces, or compression bandages. PROGNOSIS  If treated properly, the symptoms of  tarsal tunnel syndrome usually go away with non-surgical treatment. Occasionally, surgery is necessary to free the compressed nerve.  RELATED COMPLICATIONS  Permanent nerve damage, including pain, numbness, tingling, or weakness in the ankle. TREATMENT Treatment first involves resting from any activities that aggravate the symptoms, as well as the use of ice and medicine to reduce pain and inflammation. The use of strengthening and stretching exercises may help reduce pain from activity. Other treatments include wearing arch supports, if you have flat feet, and cross training (training in various physical activities) to reduce stress on the foot and ankle. If symptoms persist, despite non-surgical treatment, then surgery may be recommended. Surgery usually provides full relief from symptoms.  MEDICATION   If pain medicine is necessary, nonsteroidal anti-inflammatory medicines (aspirin and ibuprofen), or other minor pain relievers (acetaminophen), are often recommended.  Do not take pain medication for 7 days before surgery.  Prescription pain relievers may be given if your caregiver thinks they are  necessary. Use only as directed and only as much as you need. HEAT AND COLD  Cold treatment (icing) relieves pain and reduces inflammation. Cold treatment should be applied for 10 to 15 minutes every 2 to 3 hours, and immediately after any activity that aggravates your symptoms. Use ice packs or an ice massage.  Heat treatment may be used prior to performing stretching and strengthening activities prescribed by your caregiver, physical therapist, or athletic trainer. Use a heat pack or a warm water soak. SEEK MEDICAL CARE IF:  Treatment does not seem to help, or the condition worsens.  Any medicines produce negative side effects.  Any complications from surgery occur:  Pain, numbness, or coldness in the affected foot.  Discoloration beneath the toenails (blue or gray) of the affected  foot.  Signs of infections (fever, pain, inflammation, redness, or persistent bleeding). EXERCISES RANGE OF MOTION (ROM) AND STRETCHING EXERCISES - Tarsal Tunnel Syndrome (Posterior Tibial Nerve Compression) These exercises may help you when beginning to restore activity to your injured foot. Complete these exercises with caution. Nerves are very sensitive tissue. They must be exercised gently. Never force a motion and do not push through discomfort. Notify your caregiver of any exercises which increase your pain or worsen your symptoms. Your symptoms may go away with or without further involvement from your physician, physical therapist or athletic trainer. While completing these exercises, remember:   Restoring tissue flexibility helps normal motion to return to the joints. This allows healthier, less painful movement and activity.  An effective stretch should be held for at least 30 seconds.  A stretch should never be painful. You should only feel a gentle lengthening or release in the stretched tissue. STRETCH - Gastroc, Standing  Place hands on wall.  Extend right / left leg behind you and place a folded washcloth under the arch of your foot for support. Keep the front knee somewhat bent.  Slightly point your toes inward on your back foot.  Keeping your right / left heel on the floor and your knee straight, shift your weight toward the wall, not allowing your back to arch.  You should feel a gentle stretch in the right / left calf. Hold this position for __________ seconds. Repeat __________ times. Complete this stretch __________ times per day. STRETCH - Soleus, Standing  Place hands on wall.  Extend right / left leg behind you and place a folded washcloth under the arch of your foot for support. Keep the front knee somewhat bent.  Slightly point your toes inward on your back foot.  Keep your right / left heel on the floor, bend your back knee, and slightly shift your weight  over the back leg so that you feel a gentle stretch deep in your back calf.  Hold this position for __________ seconds. Repeat __________ times. Complete this stretch __________ times per day. RANGE OF MOTION - Toe Extension, Flexion  Sit with your right / left leg crossed over your opposite knee.  Grasp your toes and gently pull them back toward the top of your foot. You should feel a stretch on the bottom of your toes and foot.  Hold this stretch for __________ seconds.  Now, gently pull your toes toward the bottom of your foot. You should feel a stretch on the top of your toes and foot.  Hold this stretch for __________ seconds. Repeat __________ times. Complete this stretch __________ times per day.  RANGE OF MOTION - Ankle Eversion  Sit with your  right / left ankle crossed over your opposite knee.  Grip your foot with your opposite hand, placing your thumb on the top of your foot and your fingers across the bottom of your foot.  Gently push your foot downward with a slight rotation so your littlest toes rise slightly toward the ceiling.  You should feel a gentle stretch on the inside of your ankle. Hold the stretch for __________ seconds. Repeat __________ times. Complete this exercise __________ times per day.  RANGE OF MOTION - Ankle Dorsiflexion, Active Assisted  Remove shoes and sit on a chair, preferably not on a carpeted surface.  Place right / left foot on the floor, directly under knee. Extend your opposite leg for support.  Keeping your heel down, slide your right / left foot back toward the chair until you feel a stretch at your ankle or calf. If you do not feel a stretch, slide your bottom forward to the edge of the chair while still keeping your heel down.  Hold this stretch for __________ seconds. Repeat __________ times. Complete this stretch __________ times per day.  STRETCH - Hamstrings, Supine  Lie on your back. Loop a belt or towel over the ball of your  right / left foot.  Straighten your right / left knee and slowly pull on the belt to raise your leg. Do not allow the right / left knee to bend. Keep your opposite leg flat on the floor.  Raise the leg until you feel a gentle stretch behind your right / left knee or thigh. Hold this position for __________ seconds. Repeat __________ times. Complete this stretch __________ times per day.  STRETCH - Hamstrings, Doorway  Lie on your back with your right / left leg extended and resting on the wall and the opposite leg flat on the ground through the door. Initially, position your bottom farther away from the wall than the illustration shows.  Keep your right / left knee straight. If you feel a stretch behind your knee or thigh, hold this position for __________ seconds.  If you do not feel a stretch, scoot your bottom closer to the door and hold __________ seconds. Repeat __________ times. Complete this stretch __________ times per day.  STRETCH - Hamstrings, Standing  Stand or sit, and extend your right / left leg, placing your foot on a chair or foot stool  Keep a slight arch in your low back, and keep your hips straight forward.  Lead with your chest and lean forward at the waist, until you feel a gentle stretch in the back of your right / left knee or thigh. (When done correctly, this exercise requires leaning only a small distance.)  Hold this position for __________ seconds. Repeat __________ times. Complete this stretch __________ times per day. STRENGTHENING EXERCISES - Tarsal Tunnel Syndrome (Posterior Tibial Nerve Compression) These exercises may help you when beginning to restore activity to your injured foot. Your symptoms may go away with or without further involvement from your physician, physical therapist or athletic trainer. While completing these exercises, remember:   Muscles can gain both the endurance and the strength needed for everyday activities through controlled  exercises.  Complete these exercises as instructed by your physician, physical therapist or athletic trainer. Increase the resistance and repetitions only as guided.  You may experience muscle soreness or fatigue, but the pain or discomfort you are trying to eliminate should never worsen during these exercises. If this pain does worsen, stop and make certain you  are following the directions exactly. If the pain is still present after adjustments, discontinue the exercise until you can discuss the trouble with your caregiver. STRENGTH - Dorsiflexors  Secure a rubber exercise band or tubing to a fixed object (table, pole) and loop the other end around your right / left foot.  Sit on the floor facing the fixed object. The band should be slightly tense when your foot is relaxed.  Slowly draw your foot back toward you, using your ankle and toes.  Hold this position for __________ seconds. Slowly release the tension in the band and return your foot to the starting position. Repeat __________ times. Complete this exercise __________ times per day.  STRENGTH - Plantar-flexors  Sit with your right / left leg extended. Holding onto both ends of a rubber exercise band or tubing, loop it around the ball of your foot. Keep a slight tension in the band.  Slowly push your toes away from you, pointing them downward.  Hold this position for __________ seconds. Return to the starting position slowly, controlling the tension in the band. Repeat __________ times. Complete this exercise __________ times per day.  STRENGTH - Plantar-flexors, Standing  Stand with your feet shoulder width apart. Place your hands on a wall or table to steady yourself, using as little support as needed.  Keeping your weight evenly spread over the width of your feet, rise up on your toes.*  Hold this position for __________ seconds. Repeat __________ times. Complete this exercise __________ times per day.  *If this is too easy,  shift your weight toward your right / left leg until you feel challenged. Ultimately, you may be asked to do this exercise while standing on your right / left foot only. STRENGTH - Towel Curls  Sit in a chair, on a non-carpeted surface.  Place your foot on a towel, keeping your heel on the floor.  Pull the towel toward your heel only by curling your toes. Keep your heel on the floor.  If instructed by your physician, physical therapist or athletic trainer, add ____________________ at the end of the towel. Repeat __________ times. Complete this exercise __________ times per day. STRENGTH - Ankle Eversion  Secure one end of a rubber exercise band or tubing to a fixed object (table, pole). Loop the other end around your foot, just before your toes.  Place your fists between your knees. This will focus your strengthening at your ankle.  Drawing the band across your opposite foot, away from the pole, slowly pull your little toe out and up. Make sure the band is positioned to resist the entire motion.  Hold this position for __________ seconds.  Return to the starting position slowly, controlling the tension in the band. Repeat __________ times. Complete this exercise __________ times per day.  STRENGTH - Ankle Inversion  Secure one end of a rubber exercise band or tubing to a fixed object (table, pole). Loop the other end around your foot, just before your toes.  Place your fists between your knees. This will focus your strengthening at your ankle.  Slowly, pull your big toe up and in, making sure the band is positioned to resist the entire motion.  Hold this position for __________ seconds.  Return to the starting position slowly, controlling the tension in the band. Repeat __________ times. Complete this exercises __________ times per day.    This information is not intended to replace advice given to you by your health care provider. Make sure you  discuss any questions you have with  your health care provider.   Document Released: 05/24/2005 Document Revised: 10/08/2014 Document Reviewed: 09/05/2008 Elsevier Interactive Patient Education [employer].

## 2015-05-22 LAB — LIPID PANEL
CHOL/HDL RATIO: 4.5 ratio (ref <=5.0)
Cholesterol: 230 mg/dL — ABNORMAL HIGH (ref 125–200)
HDL: 51 mg/dL (ref >=46)
LDL Cholesterol: 147 mg/dL — ABNORMAL HIGH (ref <130)
Triglycerides: 160 mg/dL — ABNORMAL HIGH (ref <150)
VLDL: 32 mg/dL — ABNORMAL HIGH (ref <30)

## 2015-05-22 LAB — CK: Total CK: 40 U/L (ref 7–177)

## 2015-05-22 LAB — HEPATIC FUNCTION PANEL
ALT: 18 U/L (ref 6–29)
AST: 15 U/L (ref 10–35)
Albumin: 4.1 g/dL (ref 3.6–5.1)
Alkaline Phosphatase: 73 U/L (ref 33–130)
BILIRUBIN DIRECT: 0.1 mg/dL (ref <=0.2)
Indirect Bilirubin: 0.3 mg/dL (ref 0.2–1.2)
Total Bilirubin: 0.4 mg/dL (ref 0.2–1.2)
Total Protein: 6.8 g/dL (ref 6.1–8.1)

## 2015-05-22 LAB — BASIC METABOLIC PANEL WITH GFR
BUN: 15 mg/dL (ref 7–25)
CHLORIDE: 103 mmol/L (ref 98–110)
CO2: 25 mmol/L (ref 20–31)
CREATININE: 0.85 mg/dL (ref 0.50–1.05)
Calcium: 9.4 mg/dL (ref 8.6–10.4)
GFR, Est African American: 87 mL/min (ref >=60)
GFR, Est Non African American: 75 mL/min (ref >=60)
GLUCOSE: 109 mg/dL — AB (ref 65–99)
Potassium: 4.5 mmol/L (ref 3.5–5.3)
SODIUM: 140 mmol/L (ref 135–146)

## 2015-05-22 LAB — VITAMIN D 25 HYDROXY (VIT D DEFICIENCY, FRACTURES): Vit D, 25-Hydroxy: 48 ng/mL (ref 30–100)

## 2015-05-22 LAB — HEMOGLOBIN A1C
HEMOGLOBIN A1C: 7.2 % — AB (ref <5.7)
MEAN PLASMA GLUCOSE: 160 mg/dL — AB (ref <117)

## 2015-05-22 LAB — IRON AND TIBC
%SAT: 18 % (ref 11–50)
IRON: 67 ug/dL (ref 45–160)
TIBC: 381 ug/dL (ref 250–450)
UIBC: 314 ug/dL (ref 125–400)

## 2015-05-22 LAB — MAGNESIUM: Magnesium: 1.8 mg/dL (ref 1.5–2.5)

## 2015-05-26 ENCOUNTER — Other Ambulatory Visit: Payer: Self-pay | Admitting: Internal Medicine

## 2015-05-29 ENCOUNTER — Encounter: Payer: Self-pay | Admitting: Physician Assistant

## 2015-05-29 ENCOUNTER — Ambulatory Visit (INDEPENDENT_AMBULATORY_CARE_PROVIDER_SITE_OTHER): Payer: 59 | Admitting: Physician Assistant

## 2015-05-29 VITALS — BP 124/70 | HR 80 | Temp 97.9°F | Resp 16 | Ht 62.75 in | Wt 215.0 lb

## 2015-05-29 DIAGNOSIS — M79605 Pain in left leg: Secondary | ICD-10-CM

## 2015-05-29 MED ORDER — AMITRIPTYLINE HCL 25 MG PO TABS: 25.0000 mg | ORAL_TABLET | Freq: Every day | ORAL | Status: DC

## 2015-05-29 NOTE — Patient Instructions (Signed)
Take the amitriptyline, 1/2-1 at night for 3 days, can increase to 2 at night Stop if you get dizzy  HOME CARE INSTRUCTIONS   Do not stand or sit in one position for long periods of time. Do not sit with your legs crossed. Rest with your legs raised during the day.  Your legs have to be higher than your heart so that gravity will force the valves to open, so please really elevate your legs.   Wear elastic stockings or support hose. Do not wear other tight, encircling garments around the legs, pelvis, or waist.  [employer]  has a wide variety of well priced compression stockings. [place], [place] [id number] [phone]  Walk as much as possible to increase blood flow.  Raise the foot of your bed at night with 2-inch blocks. SEEK MEDICAL CARE IF:   The skin around your ankle starts to break down.  You have pain, redness, tenderness, or hard swelling developing in your leg over a vein.  You are uncomfortable due to leg pain. Document Released: 03/03/2005 Document Revised: 08/16/2011 Document Reviewed: 07/20/2010 [facility] Patient Information 2014 [place].  Restless Legs Syndrome Restless legs syndrome is a condition that causes uncomfortable feelings or sensations in the legs, especially while sitting or lying down. The sensations usually cause an overwhelming urge to move the legs. The arms can also sometimes be affected. The condition can range from mild to severe. The symptoms often interfere with a person's ability to sleep. CAUSES The cause of this condition is not known. RISK FACTORS This condition is more likely to develop in:  People who are older than age 81.  Pregnant women. In general, restless legs syndrome is more common in women than in men.  People who have a family history of the condition.  People who have certain medical conditions, such as iron deficiency, kidney disease, Parkinson disease, or nerve damage.  People who take  certain medicines, such as medicines for high blood pressure, nausea, colds, allergies, depression, and some heart conditions. SYMPTOMS The main symptom of this condition is uncomfortable sensations in the legs. These sensations may be:  Described as pulling, tingling, prickling, throbbing, crawling, or burning.  Worse while you are sitting or lying down.  Worse during periods of rest or inactivity.  Worse at night, often interfering with your sleep.  Accompanied by a very strong urge to move your legs.  Temporarily relieved by movement of your legs. The sensations usually affect both sides of the body. The arms can also be affected, but this is rare. People who have this condition often have tiredness during the day because of their lack of sleep at night. DIAGNOSIS This condition may be diagnosed based on your description of the symptoms. You may also have tests, including blood tests, to check for other conditions that may lead to your symptoms. In some cases, you may be asked to spend some time in a sleep lab so your sleeping can be monitored. TREATMENT Treatment for this condition is focused on managing the symptoms. Treatment may include:  Self-help and lifestyle changes.  Medicines. HOME CARE INSTRUCTIONS  Take medicines only as directed by your health care provider.  Try these methods to get temporary relief from the uncomfortable sensations:  Massage your legs.  Walk or stretch.  Take a cold or hot bath.  Practice good sleep habits. For example, go to bed and get up at the same time every day.  Exercise regularly.  Practice ways of relaxing, such as yoga or meditation.  Avoid caffeine and alcohol.  Do not use any tobacco products, including cigarettes, chewing tobacco, or electronic cigarettes. If you need help quitting, ask your health care provider.  Keep all follow-up visits as directed by your health care provider. This is important. SEEK MEDICAL CARE  IF: Your symptoms do not improve with treatment, or they get worse.   This information is not intended to replace advice given to you by your health care provider. Make sure you discuss any questions you have with your health care provider.   Document Released: 05/14/2002 Document Revised: 10/08/2014 Document Reviewed: 05/20/2014 Elsevier Interactive Patient Education [employer].

## 2015-05-29 NOTE — Progress Notes (Signed)
   Subjective:    Patient ID: Tracy Garrett, female    DOB: 10/31/1955, 60 y.o.   MRN: [id number]  HPI 59 y.o. AAF with history of poorly controlled DM presents for reevaluation of leg pain. She was started on gabapetin last visit but states it was not helping. She had a normal CPK.  She sits at work. Leg pain was worse at night, left more than right, but within the last week she started to have some "tiredness"/fatigue in her left leg while shopping, she would stop and it would improve. She does have some mild lower back pain. No weakness in left leg, no foot drop, no incontinence, fever, chills, SOB. CP.   Blood pressure 124/70, pulse 80, temperature 97.9 F (36.6 C), temperature source Temporal, resp. rate 16, height 5' 2.75" (1.594 m), weight 215 lb (97.523 kg), SpO2 97 %.  Review of Systems  Constitutional: Negative.  Negative for fever and chills.  HENT: Negative.   Eyes: Negative.   Respiratory: Negative.  Negative for cough and chest tightness.   Cardiovascular: Positive for leg swelling. Negative for chest pain and palpitations.  Gastrointestinal: Negative.   Genitourinary: Negative.   Musculoskeletal: Positive for myalgias. Negative for back pain, joint swelling, arthralgias, gait problem and neck pain.  Skin: Negative.   Neurological: Negative.   Psychiatric/Behavioral: Negative.        Objective:   Physical Exam  Constitutional: She is oriented to person, place, and time. She appears well-developed and well-nourished.  HENT:  Head: Normocephalic and atraumatic.  Eyes: Conjunctivae are normal. Pupils are equal, round, and reactive to light.  Neck: Normal range of motion. Neck supple.  Cardiovascular: Normal rate, regular rhythm and normal heart sounds.  Exam reveals decreased pulses (with decrease hair distally).   Pulses:      Popliteal pulses are 2+ on the right side, and 2+ on the left side.       Dorsalis pedis pulses are 2+ on the right side, and 1+ on the  left side.       Posterior tibial pulses are 1+ on the right side, and 0 on the left side.  Pulmonary/Chest: Effort normal and breath sounds normal.  Abdominal: Soft. Bowel sounds are normal. There is no tenderness.  Musculoskeletal: She exhibits edema (mild bilateral edema with + varicose veins without tenderness, erythema. ).  Negative straight leg raise, no SI tenderness.   Neurological: She is alert and oriented to person, place, and time. She displays normal reflexes. No cranial nerve deficit. Coordination normal.  + decrease sensation bilateral feet  Skin: Skin is warm and dry. No rash noted.       Assessment & Plan:  Left leg pain- Does have decreased hair, decreased pulses left side, with fatigue left leg- will get ABI rule out claudication, continue bASA, start walking Get compression stockings Try amitriptyline for possible RLS/DM neuropathy, + decreased sensation bilateral feet.

## 2015-06-02 ENCOUNTER — Encounter: Payer: Self-pay | Admitting: *Deleted

## 2015-06-03 ENCOUNTER — Encounter: Payer: Self-pay | Admitting: Internal Medicine

## 2015-06-03 ENCOUNTER — Other Ambulatory Visit: Payer: Self-pay | Admitting: Physician Assistant

## 2015-06-03 DIAGNOSIS — M79605 Pain in left leg: Secondary | ICD-10-CM

## 2015-06-04 ENCOUNTER — Other Ambulatory Visit: Payer: Self-pay | Admitting: Physician Assistant

## 2015-06-04 DIAGNOSIS — M79605 Pain in left leg: Secondary | ICD-10-CM

## 2015-06-10 ENCOUNTER — Inpatient Hospital Stay (HOSPITAL_COMMUNITY): Admission: RE | Admit: 2015-06-10 | Payer: 59 | Source: Ambulatory Visit

## 2015-06-13 ENCOUNTER — Ambulatory Visit (HOSPITAL_COMMUNITY)
Admission: RE | Admit: 2015-06-13 | Discharge: 2015-06-13 | Disposition: A | Discharge status: Home / Self Care | Payer: 59 | Source: Ambulatory Visit | Attending: Cardiology | Admitting: Cardiology

## 2015-06-13 DIAGNOSIS — M79605 Pain in left leg: Secondary | ICD-10-CM | POA: Insufficient documentation

## 2015-06-13 DIAGNOSIS — E119 Type 2 diabetes mellitus without complications: Secondary | ICD-10-CM | POA: Insufficient documentation

## 2015-06-13 DIAGNOSIS — M79604 Pain in right leg: Secondary | ICD-10-CM | POA: Diagnosis not present

## 2015-06-13 DIAGNOSIS — E785 Hyperlipidemia, unspecified: Secondary | ICD-10-CM | POA: Insufficient documentation

## 2015-08-08 ENCOUNTER — Encounter: Payer: Self-pay | Admitting: Internal Medicine

## 2015-08-08 ENCOUNTER — Other Ambulatory Visit: Payer: Self-pay | Admitting: *Deleted

## 2015-08-08 ENCOUNTER — Ambulatory Visit (INDEPENDENT_AMBULATORY_CARE_PROVIDER_SITE_OTHER): Payer: 59 | Admitting: Internal Medicine

## 2015-08-08 VITALS — BP 114/80 | HR 72 | Temp 97.7°F | Resp 18 | Ht 62.75 in | Wt 211.4 lb

## 2015-08-08 DIAGNOSIS — E0841 Diabetes mellitus due to underlying condition with diabetic mononeuropathy: Secondary | ICD-10-CM

## 2015-08-08 DIAGNOSIS — G587 Mononeuritis multiplex: Secondary | ICD-10-CM | POA: Diagnosis not present

## 2015-08-08 MED ORDER — PANTOPRAZOLE SODIUM 40 MG PO TBEC: 40.0000 mg | DELAYED_RELEASE_TABLET | Freq: Every day | ORAL | Status: DC

## 2015-08-08 MED ORDER — GABAPENTIN 300 MG PO CAPS: ORAL_CAPSULE | ORAL | Status: DC

## 2015-08-08 NOTE — Progress Notes (Signed)
Subjective:    Patient ID: Tracy Garrett, female    DOB: 05-08-1956, 60 y.o.   MRN: [id number]  HPI  This very nice 60 yo MBF with HTN, HLD, & T2DM presents with c/o R lateral abd, flank an R lateral chest discomfort which predates about 2 years to June 2015 when she had a 1st Abd CT scan to evaluate and found no internal abnormalities. She then was seen by Gastroenterologist Dr Collene Mares and had a 2sd CTscan which likewise found no abnormalities. She relates intermittent and waxing & waning of discomfort in this area since. Denies associated N/V, Htburn  or other GI Sx's. Also, no associated chest or respiratory sx's.   Medication Sig  . acetaminophen  500 MG tablet Take 1,000 mg by mouth every 6 (six) hours as needed for mild pain.  Marland Kitchen VITAMIN D3 5000 UNITS TABS Take 1 tablet by mouth every other day.   . diclofenac  75 MG EC tablet Take 75 mg by mouth 2 (two) times daily.  . diphenhydramine 25 mg capsule Take 25 mg by mouth every 6 (six) hours as needed for itching.  . fexofenadine180 MG tablet Take 180 mg by mouth daily as needed for allergies.   . hydrocortisone cream 1 % Apply 1 application topically 3 (three) times daily as needed for itching.  . Omega-3 OMEGA 3  Take 1 capsule by mouth 2 (two) times daily.  . Gabapentin 100 MG capsule Take 1 tablet at night x 3 nights, then can take 2 tablets at night x 1 week, can do 1 in the AM, 2 at night for leg pain  . pantoprazole 40 MG TAKE 1 TABLET EVERY DAY  . atorvastatin  80 MG tablet Take 1/2 to 1 tablet daily or as directed  For Cholesterol (Patient not taking: Reported on 08/08/2015)  . amitriptyline  25 MG tablet Take 1 tablet (25 mg total) by mouth at bedtime.  Marland Kitchen PERCOCET 7.5-325  Take 1 tablet by mouth every 4 (four) hours as needed for severe pain.  . phentermine 37.5 MG tablet Take 1/2 to 1 tablet each morning to help with weight loss   Allergies  Allergen Reactions  . Other     NUTS CAUSE MOUTH TO [place]  . Hydromorphone Rash    Arm  reddened at IV med site   Past Medical History  Diagnosis Date  . Allergy   . Hyperlipidemia   . History of kidney stones     x2 episodes  . PONV (postoperative nausea and vomiting)   . Sleep apnea     test was dx, as mild- no further tx. required  . Diabetes mellitus without complication ([place])   . Type II or unspecified type diabetes mellitus without mention of complication, not stated as uncontrolled   . Cataracts, bilateral    Past Surgical History  Procedure Laterality Date  . Cholecystectomy  2003  . Breast surgery  1999    Reduction Mammoplasties  . Colonoscopsy  11/2007    neg- polyp removal with both procedures x2  . Vaginal hysterectomy  2002  . Tonsillectomy      and adenoids removed age 60  . Kidney stones      surgery to extract x 1  . Eus N/A 07/26/2014    Procedure: UPPER ENDOSCOPIC ULTRASOUND (EUS) LINEAR;  Surgeon: Beryle Beams, MD;  Location: [facility];  Service: Endoscopy;  Laterality: N/A;  . 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 Lysis of adhesion  10/10/2014    Procedure: LYSIS OF ADHESION;  Surgeon: [name], MD;  Location: [facility];  Service: General;;   Review of Systems  10 point systems review negative except as above.    Objective:   Physical Exam  BP 114/80 mmHg  Pulse 72  Temp(Src) 97.7 F (36.5 C)  Resp 18  Ht 5' 2.75" (1.594 m)  Wt 211 lb 6.4 oz (95.89 kg)  BMI 37.74 kg/m2  HEENT - Eac's patent. TM's Nl. EOM's full. PERRLA. NasoOroPharynx clear. Neck - supple. Nl Thyroid. Carotids 2+ & No bruits, nodes, JVD Chest - Clear equal BS w/o Rales, rhonchi, wheezes. Cor - Nl HS. RRR w/o sig MGR. PP 1(+). No edema. Abd - No palpable organomegaly, masses or tenderness. BS nl. MS- FROM w/o deformities. Muscle power, tone and bulk Nl. Gait Nl. Neuro - No obvious Cr N abnormalities. Motor  and Cerebellar functions appear Nl w/o focal abnormalities. Sensory Normal with areas of allodynia or hypersensitivity over the R lateral chest & abdomen.  Psyche - Mental status normal & appropriate.  No delusions, ideations or obvious mood abnormalities.    Assessment & Plan:   1. Mononeuritis multiplex  - gabapentin (NEURONTIN) 300 MG capsule; Take 1 tablet 3 x day for pain  Dispense: 90 capsule; Refill: 2 - amitriptyline (ELAVIL) 25 MG tablet;   2. Diabetic mononeuropathy associated with diabetes mellitus due to underlying condition ([name])  - metFORMIN (GLUCOPHAGE-XR) 500 MG 24 hr tablet; TAKE 1 TABLET 2 X DAY AFTER BREAKFAST &LUNCH AND 2 TABLETS AFTER SUPPER FOR DIABETES; Refill: 5

## 2015-08-08 NOTE — Patient Instructions (Signed)
Diabetic Neuropathy Diabetic neuropathy is a nerve disease or nerve damage that is caused by diabetes mellitus. About half of all people with diabetes mellitus have some form of nerve damage. Nerve damage is more common in those who have had diabetes mellitus for many years and who generally have not had good control of their blood sugar (glucose) level. Diabetic neuropathy is a common complication of diabetes mellitus. There are three common types of diabetic neuropathy and a fourth type that is less common and less understood:   Peripheral neuropathy--This is the most common type of diabetic neuropathy. It causes damage to the nerves of the feet and legs first and then eventually the hands and arms.The damage affects the ability to sense touch.  Autonomic neuropathy--This type causes damage to the autonomic nervous system, which controls the following functions:  Heartbeat.  Body temperature.  Blood pressure.  Urination.  Digestion.  Sweating.    Focal neuropathy--Focal neuropathy can be painful and unpredictable and occurs most often in older adults with diabetes mellitus. It involves a specific nerve or one area and often comes on suddenly. It usually does not cause long-term problems.    Radiculoplexus neuropathy-- Sometimes called lumbosacral radiculoplexus neuropathy, radiculoplexus neuropathy affects the nerves of the thighs, hips, buttocks, or legs. It is more common in people with type 2 diabetes mellitus and in older men. It is characterized by debilitating pain, weakness, and atrophy, usually in the thigh muscles.   CAUSES  The cause of peripheral, autonomic, and focal neuropathies is diabetes mellitus that is uncontrolled and high glucose levels. The cause of radiculoplexus neuropathy is unknown. However, it is thought to be caused by inflammation related to uncontrolled glucose levels.  SIGNS AND SYMPTOMS   Peripheral Neuropathy  Peripheral neuropathy develops slowly  over time. When the nerves of the feet and legs no longer work there may be:   Burning, stabbing, or aching pain in the legs or feet.  Inability to feel pressure or pain in your feet. This can lead to:  Thick calluses over pressure areas.  Pressure sores.  Ulcers.  Foot deformities.  Reduced ability to feel temperature changes.  Muscle weakness.   Autonomic Neuropathy The symptoms of autonomic neuropathy vary depending on which nerves are affected. Symptoms may include:  Problems with digestion, such as:  Feeling sick to your stomach (nausea).  Vomiting.  Bloating.  Constipation.  Diarrhea.  Abdominal pain.  Difficulty with urination. This occurs if you lose your ability to sense when your bladder is full. Problems include:  Urine leakage (incontinence).  Inability to empty your bladder completely (retention).  Rapid or irregular heartbeat (palpitations).  Blood pressure drops when you stand up (orthostatic hypotension). When you stand up you may feel:  Dizzy.  Weak.  Faint.  In men, inability to attain and maintain an erection.  In women, vaginal dryness and problems with decreased sexual desire and arousal.  Problems with body temperature regulation. Increased or decreased sweating.   Focal Neuropathy  Abnormal eye movements or abnormal alignment of both eyes.  Weakness in the wrist.  Foot drop. This results in an inability to lift the foot properly and abnormal walking or foot movement.  Paralysis on one side of your face (Bell palsy).  Chest or abdominal pain. Radiculoplexus Neuropathy  Sudden, severe pain in your hip, thigh, or buttocks.  Weakness and wasting of thigh muscles.  Difficulty rising from a seated position.  Abdominal swelling.  Unexplained weight loss (usually more than 10 lb [4.5  kg]).           DIAGNOSIS   Peripheral Neuropathy  Your senses may be tested. Sensory function testing can be done with:  A light  touch using a monofilament.  A vibration with tuning fork.  A sharp sensation with a pin prick. Other tests that can help diagnose neuropathy are:  Nerve conduction velocity. This test checks the transmission of an electrical current through a nerve.  Electromyography. This shows how muscles respond to electrical signals transmitted by nearby nerves.  Quantitative sensory testing. This is used to assess how your nerves respond to vibrations and changes in temperature.   Autonomic Neuropathy Diagnosis is often based on reported symptoms. Tell your health care provider if you experience:   Dizziness.   Constipation.   Diarrhea.   Inappropriate urination or inability to urinate.   Inability to get or maintain an erection.  Tests that may be done include:   Electrocardiography or Holter monitor. These are tests that can help show problems with the heart rate or heart rhythm.   An X-ray exam may be done.   Focal Neuropathy Diagnosis is made based on your symptoms and what your health care provider finds during your exam. Other tests may be done. They may include:  Nerve conduction velocities. This checks the transmission of electrical current through a nerve.  Electromyography. This shows how muscles respond to electrical signals transmitted by nearby nerves.  Quantitative sensory testing. This test is used to assess how your nerves respond to vibration and changes in temperature.   Radiculoplexus Neuropathy   Often the first thing is to eliminate any other issue or problems that might be the cause, as there is no stick test for diagnosis.  X-ray exam of your spine and lumbar region.  Spinal tap to rule out cancer.  MRI to rule out other lesions. TREATMENT  Once nerve damage occurs, it cannot be reversed. The goal of treatment is to keep the disease or nerve damage from getting worse and affecting more nerve fibers. Controlling your blood glucose level is the  key. Most people with radiculoplexus neuropathy see at least a partial improvement over time. You will need to keep your blood glucose and HbA1c levels in the target range determined by your health care provider. Things that help control blood glucose levels include:   Blood glucose monitoring.   Meal planning.   Physical activity.   Diabetes medicine.  Over time, maintaining lower blood glucose levels helps lessen symptoms. Sometimes, prescription pain medicine is needed.  HOME CARE INSTRUCTIONS:  Do not smoke.  Keep your blood glucose level in the range that you and your health care provider have determined acceptable for you.  Keep your blood pressure level in the range that you and your health care provider have determined acceptable for you.  Eat a well-balanced diet.  Be physically active every day. Include strength training and balance exercises.  Protect your feet.  Check your feet every day for sores, cuts, blisters, or signs of infection.  Wear padded socks and supportive shoes. Use orthotic inserts, if necessary.  Regularly check the insides of your shoes for worn spots. Make sure there are no rocks or other items inside your shoes before you put them on.

## 2015-08-28 ENCOUNTER — Ambulatory Visit: Payer: Self-pay | Admitting: Physician Assistant

## 2015-09-24 ENCOUNTER — Encounter: Payer: Self-pay | Admitting: Internal Medicine

## 2015-10-21 ENCOUNTER — Encounter: Payer: Self-pay | Admitting: Internal Medicine

## 2015-10-26 ENCOUNTER — Encounter: Payer: Self-pay | Admitting: *Deleted

## 2015-11-15 ENCOUNTER — Encounter: Payer: Self-pay | Admitting: *Deleted

## 2016-01-24 ENCOUNTER — Other Ambulatory Visit: Payer: Self-pay | Admitting: Physician Assistant

## 2016-02-09 ENCOUNTER — Encounter: Payer: Self-pay | Admitting: *Deleted

## 2016-03-17 ENCOUNTER — Other Ambulatory Visit: Payer: Self-pay | Admitting: Internal Medicine

## 2016-03-29 IMAGING — CR DG CHEST 2V
2 series · 2 of 2 positions shown · non-contrast
Comparison: 05/26/2010

CLINICAL DATA: Right chest pain

EXAM:
CHEST  2 VIEW

[PA]
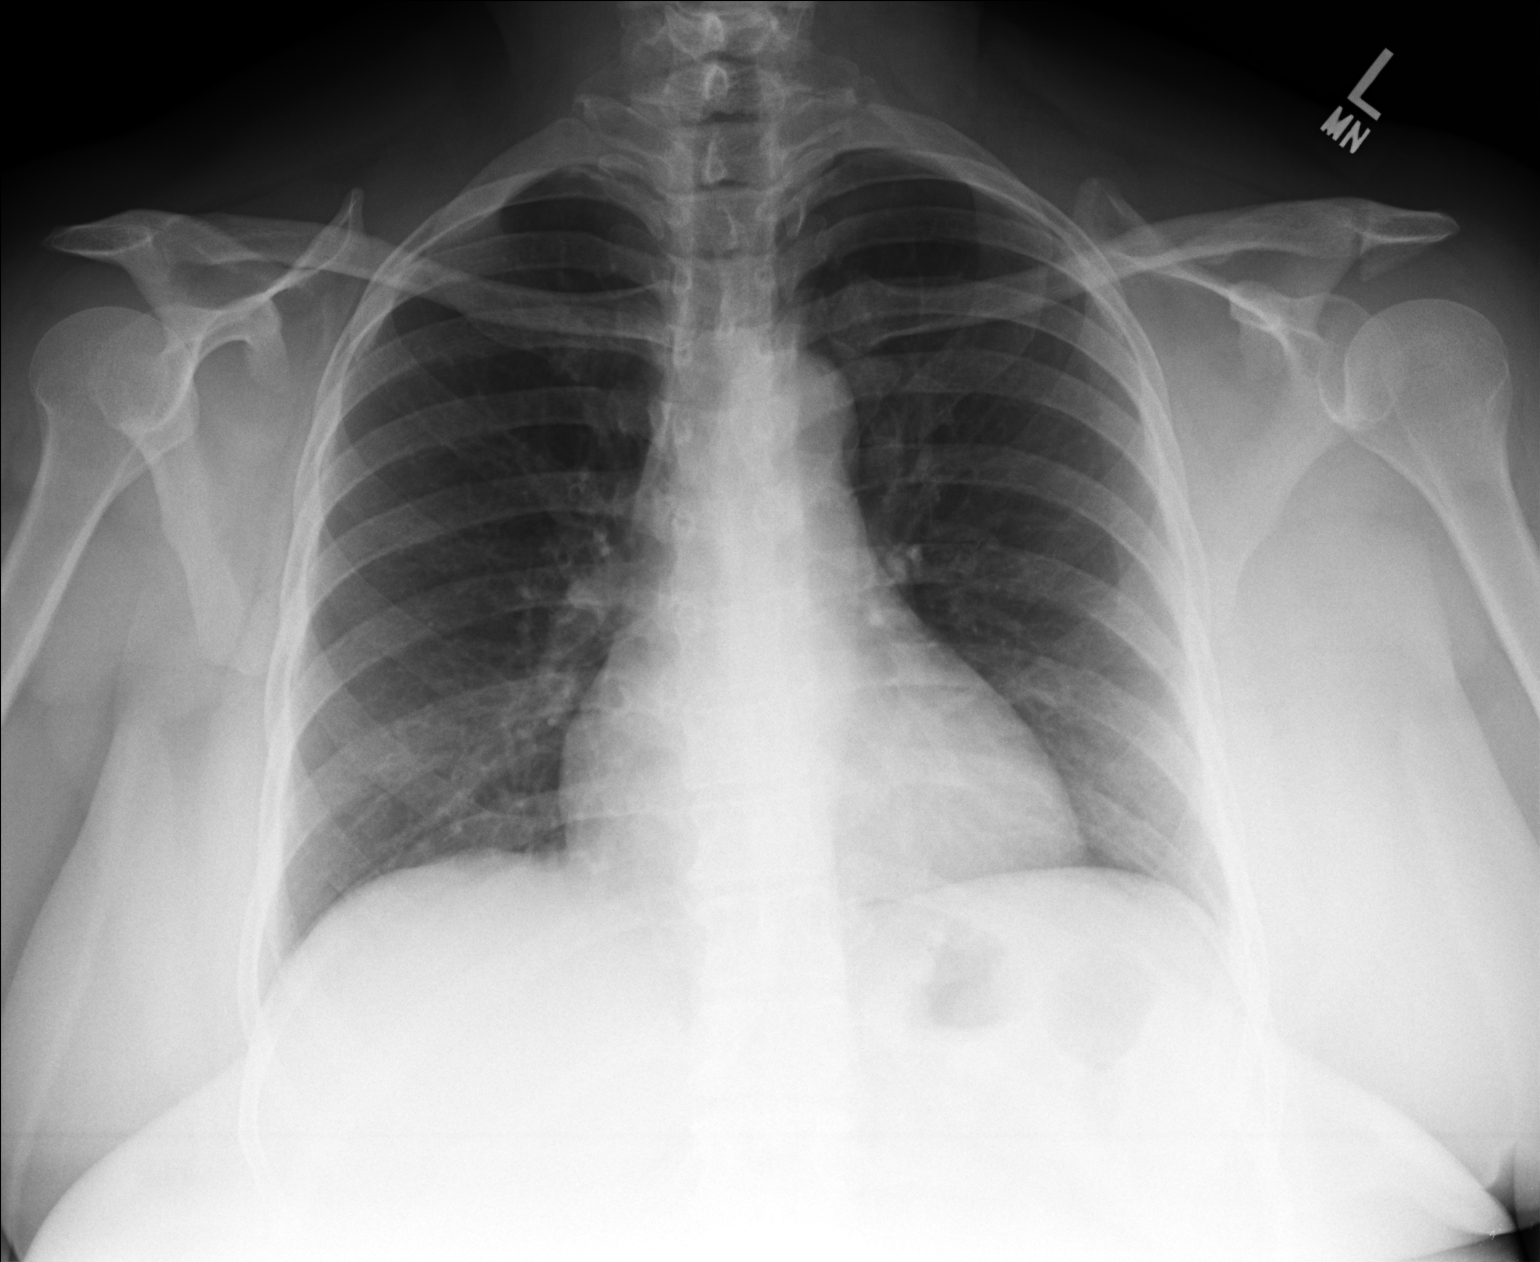

[lateral]
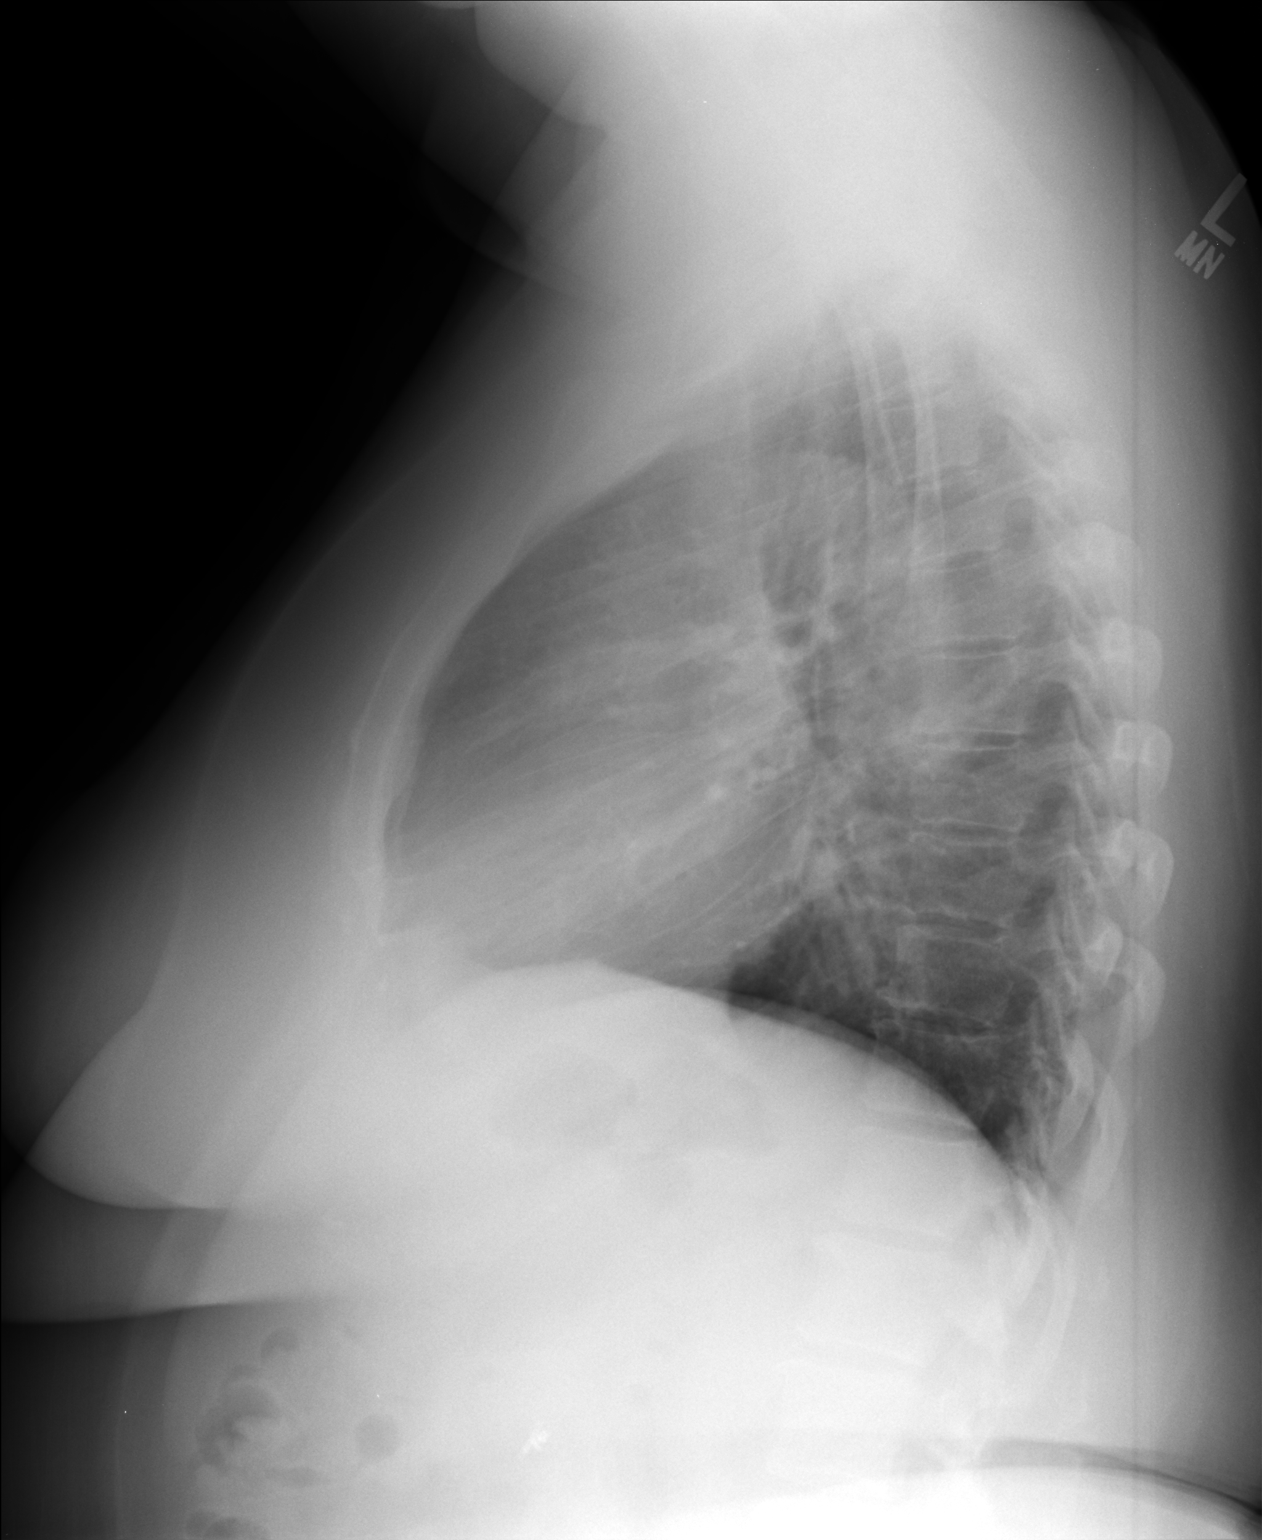

[2 of 2 positions shown; findings below may reference images not displayed]

FINDINGS: The cardiomediastinal silhouette is unremarkable.

There is no evidence of focal airspace disease, pulmonary edema,
suspicious pulmonary nodule/mass, pleural effusion, or pneumothorax.
No acute bony abnormalities are identified.
IMPRESSION: No active cardiopulmonary disease.

## 2016-06-12 ENCOUNTER — Encounter: Payer: Self-pay | Admitting: *Deleted

## 2016-08-25 ENCOUNTER — Other Ambulatory Visit: Payer: Self-pay | Admitting: Internal Medicine

## 2016-09-12 DIAGNOSIS — N390 Urinary tract infection, site not specified: Secondary | ICD-10-CM | POA: Diagnosis not present

## 2016-09-13 DIAGNOSIS — N390 Urinary tract infection, site not specified: Secondary | ICD-10-CM | POA: Diagnosis not present

## 2016-09-17 DIAGNOSIS — L0100 Impetigo, unspecified: Secondary | ICD-10-CM | POA: Diagnosis not present

## 2016-09-25 DIAGNOSIS — N390 Urinary tract infection, site not specified: Secondary | ICD-10-CM | POA: Diagnosis not present

## 2016-10-01 ENCOUNTER — Other Ambulatory Visit: Payer: Self-pay | Admitting: Internal Medicine

## 2016-10-14 ENCOUNTER — Other Ambulatory Visit: Payer: Self-pay | Admitting: *Deleted

## 2016-10-14 MED ORDER — GLUCOSE BLOOD VI STRP: ORAL_STRIP | 12 refills | Status: AC

## 2016-10-27 NOTE — Progress Notes (Signed)
Assessment and Plan:   Hypertension -Continue medication, monitor blood pressure at home. Continue DASH diet.  Reminder to go to the ER if any CP, SOB, nausea, dizziness, severe HA, changes vision/speech, left arm numbness and tingling and jaw pain.  Cholesterol -Continue diet and exercise. Check cholesterol.  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 Check A1C - try to increase metformin to 4 pills a day - continue on gabapentin 100mg  TID - continue on bASA   Vitamin D Def - check level and continue medications.   Obesity with co morbidities - long discussion about weight loss, diet, and exercise  OSA/insomnia Will refer to Dr. Brett Fairy, had mild sleep study in 2015 with AHI 9.8 (worse REM) and when weighted 214, now 220 and has several more symptoms with insomnia, also with DM/chol being treated will help decrease MI/Stroke, patient agreeable to go see Dr. Brett Fairy.  Need better control chol/HTN Will restart chol pending labs  Continue diet and meds as discussed. Further disposition pending results of labs. Over 30 minutes of exam, counseling, chart review, and critical decision making was performed  HPI [name]  presents for 3 month follow up on hypertension, cholesterol, prediabetes, and vitamin D deficiency.   Her blood pressure has been controlled at home, today their BP is BP: 124/78  She does not workout. She denies chest pain, shortness of breath. Will have some right sided head pressure, will wake up with headaches, no acute changes in vision but states not seeing as well and has eye appt next month, just one episode with imbalance with the head pressure, no weakness, no trouble with speech/finding words.   She is not on cholesterol medication, never started on lipitor. Her cholesterol is not at goal. The cholesterol last visit was:   Lab Results  Component Value Date   CHOL  230 (H) 05/21/2015   HDL 51 05/21/2015   LDLCALC 147 (H) 05/21/2015   TRIG 160 (H) 05/21/2015   CHOLHDL 4.5 05/21/2015    She has been working on diet and exercise for diabetes with possible mononeuropathy treated 2-3 months ago with gabapentin and elavil.  not on ACE due to hypotension, on metformin 2 at night, not on bASA , not checking sugars, and denies polydipsia, polyuria and visual disturbances. Last A1C in the office was:  Lab Results  Component Value Date   HGBA1C 7.2 (H) 05/21/2015   Lab Results  Component Value Date   GFRAA 87 05/21/2015   Patient is on Vitamin D supplement.   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 Zella Richer.  BMI is Body mass index is 39.43 kg/m., she is working on diet and exercise, She does not sleep well, does not wake up refreshed, she can sit in her recliner and sleep, no issues with falling asleep but will wake up in the night, will have morning headaches. She also describes an episode of 2 of being awake in bed, hearing but not being able to respond. She had sleep study in 2015, AHI was 9.8, she had worse episodes in REM, has gained weight since that time. . Wt Readings from Last 3 Encounters:  10/28/16 220 lb 12.8 oz (100.2 kg)  08/08/15 211 lb 6.4 oz (95.9 kg)  05/29/15 215 lb (97.5 kg)    Current Medications:  Current Outpatient Prescriptions on File Prior to Visit  Medication Sig Dispense Refill  . acetaminophen (TYLENOL) 500 MG tablet Take 1,000 mg by mouth every 6 (six) hours as needed for mild pain.    . Cholecalciferol (VITAMIN D3) 5000 UNITS TABS Take 1 tablet by mouth every other day.     . diphenhydrAMINE (BENADRYL) 25 mg capsule Take 25 mg by mouth every 6 (six) hours as needed for itching.    Marland Kitchen glucose blood (CONTOUR NEXT TEST) test strip Check blood sugar 1 time daily-[id number] 100 each 12  . hydrocortisone cream 1 % Apply 1 application topically 3  (three) times daily as needed for itching.    . metFORMIN (GLUCOPHAGE-XR) 500 MG 24 hr tablet TAKE 1 TABLET 2 X DAY AFTER BREAKFAST &LUNCH AND 2 TABLETS AFTER SUPPER FOR DIABETES  5  . Omega-3 Fatty Acids (OMEGA 3 PO) Take 1 capsule by mouth 2 (two) times daily.    . pantoprazole (PROTONIX) 40 MG tablet TAKE 1 TABLET BY MOUTH EVERY DAY 30 tablet 0  . gabapentin (NEURONTIN) 300 MG capsule Take 1 tablet 3 x day for pain 90 capsule 2   No current facility-administered medications on file prior to visit.    Medical History:  Past Medical History:  Diagnosis Date  . Allergy   . Cataracts, bilateral   . Diabetes mellitus without complication ([place])   . History of kidney stones    x2 episodes  . Hyperlipidemia   . PONV (postoperative nausea and vomiting)   . Sleep apnea    test was dx, as mild- no further tx. required  . Type II or unspecified type diabetes mellitus without mention of complication, not stated as uncontrolled    Allergies:  Allergies  Allergen Reactions  . Other     NUTS CAUSE MOUTH TO [place]  . Hydromorphone Rash    Arm reddened at IV med site    Review of Systems:  Review of Systems  Constitutional: Negative.   HENT: Negative.   Eyes: Negative.   Respiratory: Negative.   Cardiovascular: Negative.   Gastrointestinal: Negative.   Genitourinary: Negative.   Musculoskeletal: Positive for joint pain and myalgias. Negative for back pain, falls and neck pain.  Skin: Negative.   Neurological: Negative.   Endo/Heme/Allergies: Negative.   Psychiatric/Behavioral: Negative.     Family history- Review and unchanged Social history- Review and unchanged Physical Exam: BP 124/78   Pulse 89   Temp 97.9 F (36.6 C)   Resp 16   Ht 5' 2.75" (1.594 m)   Wt 220 lb 12.8 oz (100.2 kg)   SpO2 98%   BMI 39.43 kg/m  Wt Readings from Last 3 Encounters:  10/28/16 220 lb 12.8 oz (100.2 kg)  08/08/15 211 lb 6.4 oz (95.9 kg)  05/29/15 215 lb (97.5 kg)   General Appearance:  Well nourished, in no apparent distress. Eyes: PERRLA, EOMs, conjunctiva no swelling or erythema Sinuses: No Frontal/maxillary tenderness ENT/Mouth: Ext aud canals clear, TMs without erythema, bulging. No erythema, swelling, or exudate on post pharynx.  Tonsils not swollen or erythematous. Hearing normal.  Neck: Supple, thyroid normal.  Respiratory: Respiratory effort normal, BS equal bilaterally without rales, rhonchi, wheezing or stridor.  Cardio: RRR with no MRGs. Brisk peripheral pulses without edema.  Abdomen: Soft, + BS,  Non tender, no guarding, rebound, hernias, masses. Lymphatics: Non tender without lymphadenopathy.  Musculoskeletal: Full ROM, 5/5 strength, Normal gait. Negative straight leg raise.  Skin: Warm, dry without rashes, lesions, ecchymosis.  Neuro: Cranial nerves intact. Normal muscle tone, no cerebellar symptoms. Decrease sensation bilateral feet with bilateral bunions, no ulcers.  Psych: Awake and oriented X 3, normal affect, Insight and Judgment appropriate.    Vicie Mutters, PA-C 11:45 AM [facility]

## 2016-10-28 ENCOUNTER — Encounter (INDEPENDENT_AMBULATORY_CARE_PROVIDER_SITE_OTHER): Payer: Self-pay

## 2016-10-28 ENCOUNTER — Encounter: Payer: Self-pay | Admitting: Physician Assistant

## 2016-10-28 ENCOUNTER — Ambulatory Visit (INDEPENDENT_AMBULATORY_CARE_PROVIDER_SITE_OTHER): Payer: 59 | Admitting: Physician Assistant

## 2016-10-28 ENCOUNTER — Other Ambulatory Visit: Payer: Self-pay

## 2016-10-28 VITALS — BP 124/78 | HR 89 | Temp 97.9°F | Resp 16 | Ht 62.75 in | Wt 220.8 lb

## 2016-10-28 DIAGNOSIS — E119 Type 2 diabetes mellitus without complications: Secondary | ICD-10-CM

## 2016-10-28 DIAGNOSIS — G4733 Obstructive sleep apnea (adult) (pediatric): Secondary | ICD-10-CM | POA: Diagnosis not present

## 2016-10-28 DIAGNOSIS — I10 Essential (primary) hypertension: Secondary | ICD-10-CM

## 2016-10-28 DIAGNOSIS — G4700 Insomnia, unspecified: Secondary | ICD-10-CM

## 2016-10-28 DIAGNOSIS — Z79899 Other long term (current) drug therapy: Secondary | ICD-10-CM | POA: Diagnosis not present

## 2016-10-28 DIAGNOSIS — E0841 Diabetes mellitus due to underlying condition with diabetic mononeuropathy: Secondary | ICD-10-CM | POA: Diagnosis not present

## 2016-10-28 DIAGNOSIS — K760 Fatty (change of) liver, not elsewhere classified: Secondary | ICD-10-CM | POA: Diagnosis not present

## 2016-10-28 DIAGNOSIS — E559 Vitamin D deficiency, unspecified: Secondary | ICD-10-CM | POA: Diagnosis not present

## 2016-10-28 DIAGNOSIS — D649 Anemia, unspecified: Secondary | ICD-10-CM

## 2016-10-28 DIAGNOSIS — E785 Hyperlipidemia, unspecified: Secondary | ICD-10-CM

## 2016-10-28 LAB — LIPID PANEL
Cholesterol: 252 mg/dL — ABNORMAL HIGH (ref <200)
HDL: 55 mg/dL (ref >50)
LDL CALC: 163 mg/dL — AB (ref <100)
TRIGLYCERIDES: 170 mg/dL — AB (ref <150)
Total CHOL/HDL Ratio: 4.6 Ratio (ref <5.0)
VLDL: 34 mg/dL — AB (ref <30)

## 2016-10-28 LAB — BASIC METABOLIC PANEL WITH GFR
BUN: 13 mg/dL (ref 7–25)
CO2: 24 mmol/L (ref 20–31)
Calcium: 9 mg/dL (ref 8.6–10.4)
Chloride: 105 mmol/L (ref 98–110)
Creat: 0.85 mg/dL (ref 0.50–0.99)
GFR, EST AFRICAN AMERICAN: 86 mL/min (ref >=60)
GFR, EST NON AFRICAN AMERICAN: 75 mL/min (ref >=60)
Glucose, Bld: 128 mg/dL — ABNORMAL HIGH (ref 65–99)
POTASSIUM: 4.3 mmol/L (ref 3.5–5.3)
Sodium: 140 mmol/L (ref 135–146)

## 2016-10-28 LAB — HEPATIC FUNCTION PANEL
ALK PHOS: 78 U/L (ref 33–130)
ALT: 28 U/L (ref 6–29)
AST: 23 U/L (ref 10–35)
Albumin: 4.1 g/dL (ref 3.6–5.1)
BILIRUBIN TOTAL: 0.5 mg/dL (ref 0.2–1.2)
Bilirubin, Direct: 0.1 mg/dL (ref <=0.2)
Indirect Bilirubin: 0.4 mg/dL (ref 0.2–1.2)
Total Protein: 6.8 g/dL (ref 6.1–8.1)

## 2016-10-28 LAB — CBC WITH DIFFERENTIAL/PLATELET
Basophils Absolute: 0 cells/uL (ref 0–200)
Basophils Relative: 0 %
Eosinophils Absolute: 190 cells/uL (ref 15–500)
Eosinophils Relative: 2 %
HEMATOCRIT: 39.7 % (ref 35.0–45.0)
Hemoglobin: 13.5 g/dL (ref 11.7–15.5)
LYMPHS PCT: 35 %
Lymphs Abs: 3325 cells/uL (ref 850–3900)
MCH: 30.1 pg (ref 27.0–33.0)
MCHC: 34 g/dL (ref 32.0–36.0)
MCV: 88.6 fL (ref 80.0–100.0)
MONO ABS: 760 {cells}/uL (ref 200–950)
MPV: 10.1 fL (ref 7.5–12.5)
Monocytes Relative: 8 %
Neutro Abs: 5225 cells/uL (ref 1500–7800)
Neutrophils Relative %: 55 %
PLATELETS: 282 10*3/uL (ref 140–400)
RBC: 4.48 MIL/uL (ref 3.80–5.10)
RDW: 14.9 % (ref 11.0–15.0)
WBC: 9.5 10*3/uL (ref 3.8–10.8)

## 2016-10-28 LAB — TSH: TSH: 0.88 mIU/L

## 2016-10-28 MED ORDER — METFORMIN HCL ER 500 MG PO TB24: ORAL_TABLET | ORAL | 1 refills | Status: DC

## 2016-10-28 MED ORDER — METFORMIN HCL ER 500 MG PO TB24: ORAL_TABLET | ORAL | 5 refills | Status: DC

## 2016-10-28 NOTE — Patient Instructions (Signed)
We are starting you on Metformin to prevent or treat diabetes. Metformin does not cause low blood sugars. In order to create energy your cells need insulin and sugar but sometime your cells do not accept the insulin and this can cause increased sugars and decreased energy. The Metformin helps your cells accept insulin and the sugar to give you more energy.   The two most common side effects are nausea and diarrhea, follow these rules to avoid it! You can take imodium per box instructions when starting metformin if needed.   Rules of metformin: 1) start out slow with only one pill daily. Our goal for you is 4 pills a day or 2000mg  total.  2) take with your largest meal. 3) Take with least amount of carbs.   Call if you have any problems.   Diabetes is a very complicated disease...lets simplify it.  An easy way to look at it to understand the complications is if you think of the extra sugar floating in your blood stream as glass shards floating through your blood stream.    Diabetes affects your small vessels first: 1) The glass shards (sugar) scraps down the tiny blood vessels in your eyes and lead to diabetic retinopathy, the leading cause of blindness in the [place]. Diabetes is the leading cause of newly diagnosed adult (47 to 61 years of age) blindness in the [place].  2) The glass shards scratches down the tiny vessels of your legs leading to nerve damage called neuropathy and can lead to amputations of your feet. More than 60% of all non-traumatic amputations of lower limbs occur in people with diabetes.  3) Over time the small vessels in your brain are shredded and closed off, individually this does not cause any problems but over a long period of time many of the small vessels being blocked can lead to Vascular Dementia.   4) Your kidney's are a filter system and have a "net" that keeps certain things in the body and lets bad things out. Sugar shreds this net and leads to kidney damage  and eventually failure. Decreasing the sugar that is destroying the net and certain blood pressure medications can help stop or decrease progression of kidney disease. Diabetes was the primary cause of kidney failure in 44 percent of all new cases in 2011.  5) Diabetes also destroys the small vessels in your penis that lead to erectile dysfunction. Eventually the vessels are so damaged that you may not be responsive to cialis or viagra.   Diabetes and your large vessels: Your larger vessels consist of your coronary arteries in your heart and the carotid vessels to your brain. Diabetes or even increased sugars put you at 300% increased risk of heart attack and stroke and this is why.. The sugar scrapes down your large blood vessels and your body sees this as an internal injury and tries to repair itself. Just like you get a scab on your skin, your platelets will stick to the blood vessel wall trying to heal it. This is why we have diabetics on low dose aspirin daily, this prevents the platelets from sticking and can prevent plaque formation. In addition, your body takes cholesterol and tries to shove it into the open wound. This is why we want your LDL, or bad cholesterol, below 70.   The combination of platelets and cholesterol over 5-10 years forms plaque that can break off and cause a heart attack or stroke.   PLEASE REMEMBER:  Diabetes is  preventable! Up to 27 percent of complications and morbidities among individuals with type 2 diabetes can be prevented, delayed, or effectively treated and minimized with regular visits to a health professional, appropriate monitoring and medication, and a healthy diet and lifestyle.     I think it is possible that you have sleep apnea. It can cause interrupted sleep, headaches, frequent awakenings, fatigue, dry mouth, fast/slow heart beats, memory issues, anxiety/depression, swelling, numbness tingling hands/feet, weight gain, shortness of breath, and the list  goes on. Sleep apnea needs to be ruled out because if it is left untreated it does eventually lead to abnormal heart beats, lung failure or heart failure as well as increasing the risk of heart attack and stroke. There are masks you can wear OR a mouth piece that I can give you information about. Often times though people feel MUCH better after getting treatment.   Sleep Apnea  Sleep apnea is a sleep disorder characterized by abnormal pauses in breathing while you sleep. When your breathing pauses, the level of oxygen in your blood decreases. This causes you to move out of deep sleep and into light sleep. As a result, your quality of sleep is poor, and the system that carries your blood throughout your body (cardiovascular system) experiences stress. If sleep apnea remains untreated, the following conditions can develop:  High blood pressure (hypertension).  Coronary artery disease.  Inability to achieve or maintain an erection (impotence).  Impairment of your thought process (cognitive dysfunction). There are three types of sleep apnea: 1. Obstructive sleep apnea--Pauses in breathing during sleep because of a blocked airway. 2. Central sleep apnea--Pauses in breathing during sleep because the area of the brain that controls your breathing does not send the correct signals to the muscles that control breathing. 3. Mixed sleep apnea--A combination of both obstructive and central sleep apnea.  RISK FACTORS The following risk factors can increase your risk of developing sleep apnea:  Being overweight.  Smoking.  Having narrow passages in your nose and throat.  Being of older age.  Being female.  Alcohol use.  Sedative and tranquilizer use.  Ethnicity. Among individuals younger than 35 years, African Americans are at increased risk of sleep apnea. SYMPTOMS   Difficulty staying asleep.  Daytime sleepiness and fatigue.  Loss of energy.  Irritability.  Loud, heavy snoring.   Morning headaches.  Trouble concentrating.  Forgetfulness.  Decreased interest in sex. DIAGNOSIS  In order to diagnose sleep apnea, your caregiver will perform a physical examination. Your caregiver may suggest that you take a home sleep test. Your caregiver may also recommend that you spend the night in a sleep lab. In the sleep lab, several monitors record information about your heart, lungs, and brain while you sleep. Your leg and arm movements and blood oxygen level are also recorded. TREATMENT The following actions may help to resolve mild sleep apnea:  Sleeping on your side.   Using a decongestant if you have nasal congestion.   Avoiding the use of depressants, including alcohol, sedatives, and narcotics.   Losing weight and modifying your diet if you are overweight. There also are devices and treatments to help open your airway:  Oral appliances. These are custom-made mouthpieces that shift your lower jaw forward and slightly open your bite. This opens your airway.  Devices that create positive airway pressure. This positive pressure "splints" your airway open to help you breathe better during sleep. The following devices create positive airway pressure:  Continuous positive airway pressure (CPAP)  device. The CPAP device creates a continuous level of air pressure with an air pump. The air is delivered to your airway through a mask while you sleep. This continuous pressure keeps your airway open.  Nasal expiratory positive airway pressure (EPAP) device. The EPAP device creates positive air pressure as you exhale. The device consists of single-use valves, which are inserted into each nostril and held in place by adhesive. The valves create very little resistance when you inhale but create much more resistance when you exhale. That increased resistance creates the positive airway pressure. This positive pressure while you exhale keeps your airway open, making it easier to breath  when you inhale again.  Bilevel positive airway pressure (BPAP) device. The BPAP device is used mainly in patients with central sleep apnea. This device is similar to the CPAP device because it also uses an air pump to deliver continuous air pressure through a mask. However, with the BPAP machine, the pressure is set at two different levels. The pressure when you exhale is lower than the pressure when you inhale.  Surgery. Typically, surgery is only done if you cannot comply with less invasive treatments or if the less invasive treatments do not improve your condition. Surgery involves removing excess tissue in your airway to create a wider passage way. Document Released: 05/14/2002 Document Revised: 09/18/2012 Document Reviewed: 09/30/2011 [facility] Patient Information 2015 [place], [place]. This information is not intended to replace advice given to you by your health care provider. Make sure you discuss any questions you have with your health care provider.

## 2016-10-29 ENCOUNTER — Other Ambulatory Visit: Payer: Self-pay | Admitting: Physician Assistant

## 2016-10-29 DIAGNOSIS — E782 Mixed hyperlipidemia: Secondary | ICD-10-CM

## 2016-10-29 LAB — VITAMIN B12: Vitamin B-12: 290 pg/mL (ref 200–1100)

## 2016-10-29 LAB — HEMOGLOBIN A1C
Hgb A1c MFr Bld: 7.6 % — ABNORMAL HIGH (ref <5.7)
Mean Plasma Glucose: 171 mg/dL

## 2016-10-29 LAB — MAGNESIUM: Magnesium: 1.9 mg/dL (ref 1.5–2.5)

## 2016-10-29 LAB — VITAMIN D 25 HYDROXY (VIT D DEFICIENCY, FRACTURES): Vit D, 25-Hydroxy: 45 ng/mL (ref 30–100)

## 2016-10-29 MED ORDER — ATORVASTATIN CALCIUM 40 MG PO TABS: ORAL_TABLET | ORAL | 4 refills | Status: DC

## 2016-10-31 ENCOUNTER — Other Ambulatory Visit: Payer: Self-pay | Admitting: Internal Medicine

## 2016-11-25 DIAGNOSIS — E119 Type 2 diabetes mellitus without complications: Secondary | ICD-10-CM | POA: Diagnosis not present

## 2016-11-25 DIAGNOSIS — H2513 Age-related nuclear cataract, bilateral: Secondary | ICD-10-CM | POA: Diagnosis not present

## 2016-12-28 ENCOUNTER — Encounter: Payer: Self-pay | Admitting: Internal Medicine

## 2017-02-10 ENCOUNTER — Encounter: Payer: Self-pay | Admitting: Physician Assistant

## 2017-02-17 ENCOUNTER — Encounter: Payer: Self-pay | Admitting: Internal Medicine

## 2017-02-28 ENCOUNTER — Ambulatory Visit (INDEPENDENT_AMBULATORY_CARE_PROVIDER_SITE_OTHER): Payer: 59 | Admitting: Internal Medicine

## 2017-02-28 ENCOUNTER — Encounter: Payer: Self-pay | Admitting: Internal Medicine

## 2017-02-28 VITALS — BP 126/84 | HR 92 | Temp 97.5°F | Resp 18 | Ht 62.5 in | Wt 219.4 lb

## 2017-02-28 DIAGNOSIS — Z136 Encounter for screening for cardiovascular disorders: Secondary | ICD-10-CM

## 2017-02-28 DIAGNOSIS — I10 Essential (primary) hypertension: Secondary | ICD-10-CM

## 2017-02-28 DIAGNOSIS — Z0000 Encounter for general adult medical examination without abnormal findings: Secondary | ICD-10-CM | POA: Diagnosis not present

## 2017-02-28 DIAGNOSIS — Z0001 Encounter for general adult medical examination with abnormal findings: Secondary | ICD-10-CM

## 2017-02-28 DIAGNOSIS — Z111 Encounter for screening for respiratory tuberculosis: Secondary | ICD-10-CM

## 2017-02-28 DIAGNOSIS — R5383 Other fatigue: Secondary | ICD-10-CM

## 2017-02-28 DIAGNOSIS — Z1212 Encounter for screening for malignant neoplasm of rectum: Secondary | ICD-10-CM

## 2017-02-28 DIAGNOSIS — Z79899 Other long term (current) drug therapy: Secondary | ICD-10-CM

## 2017-02-28 DIAGNOSIS — G4733 Obstructive sleep apnea (adult) (pediatric): Secondary | ICD-10-CM

## 2017-02-28 DIAGNOSIS — E782 Mixed hyperlipidemia: Secondary | ICD-10-CM

## 2017-02-28 DIAGNOSIS — E559 Vitamin D deficiency, unspecified: Secondary | ICD-10-CM

## 2017-02-28 DIAGNOSIS — E119 Type 2 diabetes mellitus without complications: Secondary | ICD-10-CM

## 2017-02-28 DIAGNOSIS — Z1211 Encounter for screening for malignant neoplasm of colon: Secondary | ICD-10-CM

## 2017-02-28 NOTE — Patient Instructions (Signed)

## 2017-02-28 NOTE — Progress Notes (Signed)
[facility] Unk Pinto, M.D.     Uvaldo Bristle. Silverio Lay, P.A.-C Liane Comber, [place] [address] [place] [address] Telephone [phone] Telefax [phone] Annual Screening/Preventative Visit & Comprehensive Evaluation &  Examination     This very nice 61 y.o. MBFpresents for a Screening/Preventative Visit & comprehensive evaluation and management of multiple medical co-morbidities.  Patient has been followed for HTN, T2_NIDDM  Hyperlipidemia and Vitamin D Deficiency. She does endorse snoring and ? apnea and never followed thru with sleep referral in the past. Patient also has GERD controlled on her meds.       Patient has hx/o labile HTN monitored expectantly since 1998. Patient's BP has been controlled at home and patient denies any cardiac symptoms as chest pain, palpitations, shortness of breath, dizziness or ankle swelling. Today's BP is at goal -126/84.      Patient's hyperlipidemia is controlled with diet and medications. Patient denies myalgias or other medication SE's. 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 Today she reports a 9-10 month hx/o her Left leg occasionally "giving out" on her - sometimes 1 x/ week to 1 or 2 x/ month. Also occasionally, she has vague pains of he LLE in a sciatic distribution. Last A1c was not at goal: Lab Results  Component Value Date   HGBA1C 7.6 (H) 10/28/2016      Finally, patient has history of Vitamin D Deficiency ("16" in 2008) and last Vitamin D was at goal: Lab  Results  Component Value Date   VD25OH 45 10/28/2016   Current Outpatient Prescriptions on File Prior to Visit  Medication Sig  . acetaminophen (TYLENOL) 500 MG tablet Take 1,000 mg by mouth every 6 (six) hours as needed for mild pain.  Marland Kitchen atorvastatin (LIPITOR) 40 MG tablet 1 tablet daily or as directed  For Cholesterol  . Cholecalciferol (VITAMIN D3) 5000 UNITS TABS Take 1 tablet by mouth every other day.   . diphenhydrAMINE (BENADRYL) 25 mg capsule Take 25 mg by mouth every 6 (six) hours as needed for itching.  Marland Kitchen glucose blood (CONTOUR NEXT TEST) test strip Check blood sugar 1 time daily-[id number]  . hydrocortisone cream 1 % Apply 1 application topically 3 (three) times daily as needed for itching.  . metFORMIN (GLUCOPHAGE-XR) 500 MG 24 hr tablet TAKE 1 TABLET 2 X DAY AFTER BREAKFAST &LUNCH AND 2 TABLETS AFTER SUPPER FOR DIABETES  . Omega-3 Fatty Acids (OMEGA 3 PO) Take 1 capsule by mouth 2 (two) times daily.  . pantoprazole (PROTONIX) 40 MG tablet TAKE 1 TABLET BY MOUTH EVERY DAY  . gabapentin (NEURONTIN) 300 MG capsule Take 1 tablet 3 x day for pain   No current facility-administered medications on file prior to visit.    Allergies  Allergen Reactions  . Other     NUTS CAUSE MOUTH TO [place]  . Hydromorphone Rash    Arm reddened at IV med site   Past Medical History:  Diagnosis Date  . Allergy   . Cataracts, bilateral   .  Diabetes mellitus without complication ([place])   . History of kidney stones    x2 episodes  . Hyperlipidemia   . PONV (postoperative nausea and vomiting)   . Sleep apnea    test was dx, as mild- no further tx. required  . Type II or unspecified type diabetes mellitus without mention of complication, not stated as uncontrolled    Health Maintenance  Topic Date Due  . PNEUMOCOCCAL POLYSACCHARIDE VACCINE (1) 05/05/1958  . PAP SMEAR  05/05/1977  . COLONOSCOPY  05/05/2006  . FOOT EXAM  09/02/2015  . URINE MICROALBUMIN  09/02/2015  . INFLUENZA VACCINE   01/05/2017  . HEMOGLOBIN A1C  04/30/2017  . OPHTHALMOLOGY EXAM  11/25/2017  . MAMMOGRAM  05/14/2018  . TETANUS/TDAP  05/06/2019  . Hepatitis C Screening  Completed  . HIV Screening  Completed   Immunization History  Administered Date(s) Administered  . Influenza-Unspecified 03/10/2015  . PPD Test 08/31/2013, 09/02/2014, 02/28/2017  . Tdap 05/05/2009   Past Surgical History:  Procedure Laterality Date  . BREAST SURGERY  1999   Reduction Mammoplasties  . CHOLECYSTECTOMY  2003  . colonoscopsy  11/2007   neg- polyp removal with both procedures x2  . EUS N/A 07/26/2014   Procedure: UPPER ENDOSCOPIC ULTRASOUND (EUS) LINEAR;  Surgeon: Beryle Beams, MD;  Location: [facility];  Service: Endoscopy;  Laterality: N/A;  . kidney stones     surgery to extract x 1  . 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 LYSIS OF ADHESION  10/10/2014   Procedure: LYSIS OF ADHESION;  Surgeon: [name], MD;  Location: [facility];  Service: General;;  . TONSILLECTOMY     and adenoids removed age 16  . VAGINAL HYSTERECTOMY  2002   Family History  Problem Relation Age of Onset  . Cancer Mother    Social History  Substance Use Topics  . Smoking status: Never Smoker  . Smokeless tobacco: Never Used  . Alcohol use No    ROS Constitutional: Denies fever, chills, weight loss/gain, headaches, insomnia,  night sweats, and change in appetite. Does c/o fatigue. Eyes: Denies redness, blurred vision, diplopia, discharge, itchy, watery eyes.  ENT: Denies discharge, congestion, post nasal drip, epistaxis, sore throat, earache, hearing loss, dental pain, Tinnitus, Vertigo, Sinus pain, snoring.  Cardio: Denies chest pain, palpitations, irregular heartbeat, syncope, dyspnea, diaphoresis, orthopnea, PND, claudication, edema Respiratory: denies cough, dyspnea, DOE,  pleurisy, hoarseness, laryngitis, wheezing.  Gastrointestinal: Denies dysphagia, heartburn, reflux, water brash, pain, cramps, nausea, vomiting, bloating, diarrhea, constipation, hematemesis, melena, hematochezia, jaundice, hemorrhoids Genitourinary: Denies dysuria, frequency, urgency, nocturia, hesitancy, discharge, hematuria, flank pain Breast: Breast lumps, nipple discharge, bleeding.  Musculoskeletal: Denies arthralgia, myalgia, stiffness, Jt. Swelling, pain, limp, and strain/sprain. Denies falls. Skin: Denies puritis, rash, hives, warts, acne, eczema, changing in skin lesion Neuro: No weakness, tremor, incoordination, spasms, paresthesia, pain Psychiatric: Denies confusion, memory loss, sensory loss. Denies Depression. Endocrine: Denies change in weight, skin, hair change, nocturia, and paresthesia, diabetic polys, visual blurring, hyper / hypo glycemic episodes.  Heme/Lymph: No excessive bleeding, bruising, enlarged lymph nodes.  Physical Exam  BP 126/84   Pulse 92   Temp (!) 97.5 F (36.4 C)   Resp 18   Ht 5' 2.5" (1.588 m)   Wt 219 lb 6.4 oz (99.5 kg)   BMI 39.49 kg/m   General Appearance: Over nourished, well groomed and in  no apparent distress.  Eyes: PERRLA, EOMs, conjunctiva no swelling or erythema, normal fundi and vessels. Sinuses: No frontal/maxillary tenderness ENT/Mouth: EACs patent / TMs  nl. Nares clear without erythema, swelling, mucoid exudates. Oral hygiene is good. No erythema, swelling, or exudate.  Mallampati 2/3. Hearing normal.  Neck:   Short/squat. Supple, thyroid normal. No bruits, nodes or JVD. Respiratory: Respiratory effort normal.  BS equal and clear bilateral without rales, rhonci, wheezing or stridor. Cardio: Heart sounds are normal with regular rate and rhythm and no murmurs, rubs or gallops. Peripheral pulses are normal and equal bilaterally without edema. No aortic or femoral bruits. Chest: symmetric with normal excursions and percussion. Breasts:  Symmetric, residua of reduction procedures without lumps, nipple discharge, retractions, or fibrocystic changes.  Abdomen: Rotund, soft with bowel sounds active. Nontender, no guarding, rebound, hernias, masses, or organomegaly.  Lymphatics: Non tender without lymphadenopathy.  Musculoskeletal: Full ROM all peripheral extremities, joint stability, 5/5 strength and normal gait. Skin: Warm and dry without rashes, lesions, cyanosis, clubbing or  ecchymosis.  Neuro: Cranial nerves intact, Lt KJ - absent. Normal muscle tone, no cerebellar symptoms. Sensation sl decreased to touch,  and Monofilament to the toes bilaterally. Pysch: Alert and oriented X 3, normal affect, Insight and Judgment appropriate.   Assessment and Plan  1. Annual Preventative Screening Examination  2. Essential hypertension  - EKG 12-Lead - Urinalysis, Routine w reflex microscopic - [place], RETROPERITNL ABD,  LTD - CBC with Differential/Platelet - BASIC METABOLIC PANEL WITH GFR - Magnesium - TSH - Microalbumin / creatinine urine ratio  3. Hyperlipidemia, mixed  - EKG 12-Lead - [place], RETROPERITNL ABD,  LTD - Hepatic function panel - Lipid panel - TSH  4. Controlled type 2 diabetes mellitus without complication, without long-term current use of insulin ([name])  - EKG 12-Lead - Urinalysis, Routine w reflex microscopic - [place], RETROPERITNL ABD,  LTD - HM DIABETES FOOT EXAM - LOW EXTREMITY NEUR EXAM DOCUM - Hemoglobin A1c - Insulin, fasting - Microalbumin / creatinine urine ratio  5. Vitamin D deficiency  - VITAMIN D 25 Hydroxy   6. OSA (obstructive sleep apnea)   7. Screening for colorectal cancer  - POC Hemoccult Bld/Stl   8. Screening for ischemic heart disease  - EKG 12-Lead  9. Screening for AAA (aortic abdominal aneurysm)  - [place], RETROPERITNL ABD,  LTD  10. Fatigue, unspecified type  - Iron,Total/Total Iron Binding Cap - Vitamin B12 - CBC with Differential/Platelet  11. Medication  management  - Urinalysis, Routine w reflex microscopic - CBC with Differential/Platelet - BASIC METABOLIC PANEL WITH GFR - Hepatic function panel - Magnesium - Lipid panel - TSH - Hemoglobin A1c - Insulin, fasting - VITAMIN D 25 Hydroxy  - Microalbumin / creatinine urine ratio  12. Screening examination for pulmonary tuberculosis  - PPD      Patient was counseled in prudent diet to achieve/maintain BMI less than 25 for weight control, BP monitoring, regular exercise and medications. Discussed med's effects and SE's. Screening labs and tests as requested with regular follow-up as recommended. Over 40 minutes of exam, counseling, chart review and high complex critical decision making was performed.

## 2017-03-01 LAB — IRON, TOTAL/TOTAL IRON BINDING CAP
%SAT: 19 % (calc) (ref 11–50)
Iron: 72 ug/dL (ref 45–160)
TIBC: 377 mcg/dL (calc) (ref 250–450)

## 2017-03-01 LAB — CBC WITH DIFFERENTIAL/PLATELET
Basophils Absolute: 36 cells/uL (ref 0–200)
Basophils Relative: 0.3 %
EOS PCT: 2.2 %
Eosinophils Absolute: 264 cells/uL (ref 15–500)
HEMATOCRIT: 40.1 % (ref 35.0–45.0)
Hemoglobin: 13.8 g/dL (ref 11.7–15.5)
LYMPHS ABS: 3384 {cells}/uL (ref 850–3900)
MCH: 29.9 pg (ref 27.0–33.0)
MCHC: 34.4 g/dL (ref 32.0–36.0)
MCV: 86.8 fL (ref 80.0–100.0)
MPV: 10.7 fL (ref 7.5–12.5)
Monocytes Relative: 7.8 %
NEUTROS PCT: 61.5 %
Neutro Abs: 7380 cells/uL (ref 1500–7800)
Platelets: 294 10*3/uL (ref 140–400)
RBC: 4.62 10*6/uL (ref 3.80–5.10)
RDW: 13.5 % (ref 11.0–15.0)
Total Lymphocyte: 28.2 %
WBC mixed population: 936 cells/uL (ref 200–950)
WBC: 12 10*3/uL — AB (ref 3.8–10.8)

## 2017-03-01 LAB — LIPID PANEL
CHOL/HDL RATIO: 4.1 (calc) (ref <5.0)
CHOLESTEROL: 223 mg/dL — AB (ref <200)
HDL: 54 mg/dL (ref >50)
LDL Cholesterol (Calc): 139 mg/dL (calc) — ABNORMAL HIGH
Non-HDL Cholesterol (Calc): 169 mg/dL (calc) — ABNORMAL HIGH (ref <130)
Triglycerides: 164 mg/dL — ABNORMAL HIGH (ref <150)

## 2017-03-01 LAB — HEPATIC FUNCTION PANEL
AG RATIO: 1.5 (calc) (ref 1.0–2.5)
ALBUMIN MSPROF: 4.4 g/dL (ref 3.6–5.1)
ALKALINE PHOSPHATASE (APISO): 84 U/L (ref 33–130)
ALT: 22 U/L (ref 6–29)
AST: 17 U/L (ref 10–35)
BILIRUBIN TOTAL: 0.6 mg/dL (ref 0.2–1.2)
Bilirubin, Direct: 0.1 mg/dL (ref 0.0–0.2)
GLOBULIN: 3 g/dL (ref 1.9–3.7)
Indirect Bilirubin: 0.5 mg/dL (calc) (ref 0.2–1.2)
TOTAL PROTEIN: 7.4 g/dL (ref 6.1–8.1)

## 2017-03-01 LAB — URINALYSIS, ROUTINE W REFLEX MICROSCOPIC
Bilirubin Urine: NEGATIVE
Glucose, UA: NEGATIVE
Hgb urine dipstick: NEGATIVE
Ketones, ur: NEGATIVE
Leukocytes, UA: NEGATIVE
Nitrite: NEGATIVE
Protein, ur: NEGATIVE
SPECIFIC GRAVITY, URINE: 1.013 (ref 1.001–1.03)
pH: 5.5 (ref 5.0–8.0)

## 2017-03-01 LAB — HEMOGLOBIN A1C
HEMOGLOBIN A1C: 7.3 %{Hb} — AB (ref <5.7)
MEAN PLASMA GLUCOSE: 163 (calc)
eAG (mmol/L): 9 (calc)

## 2017-03-01 LAB — VITAMIN D 25 HYDROXY (VIT D DEFICIENCY, FRACTURES): Vit D, 25-Hydroxy: 59 ng/mL (ref 30–100)

## 2017-03-01 LAB — BASIC METABOLIC PANEL WITH GFR
BUN: 15 mg/dL (ref 7–25)
CALCIUM: 9.2 mg/dL (ref 8.6–10.4)
CO2: 27 mmol/L (ref 20–32)
CREATININE: 0.73 mg/dL (ref 0.50–0.99)
Chloride: 100 mmol/L (ref 98–110)
GFR, EST NON AFRICAN AMERICAN: 90 mL/min/{1.73_m2} (ref >=60)
GFR, Est African American: 104 mL/min/{1.73_m2} (ref >=60)
GLUCOSE: 175 mg/dL — AB (ref 65–99)
Potassium: 3.9 mmol/L (ref 3.5–5.3)
SODIUM: 137 mmol/L (ref 135–146)

## 2017-03-01 LAB — MICROALBUMIN / CREATININE URINE RATIO
Creatinine, Urine: 114 mg/dL (ref 20–275)
Microalb Creat Ratio: 32 mcg/mg creat — ABNORMAL HIGH (ref <30)
Microalb, Ur: 3.6 mg/dL

## 2017-03-01 LAB — VITAMIN B12: Vitamin B-12: 343 pg/mL (ref 200–1100)

## 2017-03-01 LAB — TSH: TSH: 0.94 mIU/L (ref 0.40–4.50)

## 2017-03-01 LAB — INSULIN, RANDOM: Insulin: 41.2 u[IU]/mL — ABNORMAL HIGH (ref 2.0–19.6)

## 2017-03-01 LAB — MAGNESIUM: Magnesium: 1.9 mg/dL (ref 1.5–2.5)

## 2017-03-15 ENCOUNTER — Encounter: Payer: Self-pay | Admitting: Physician Assistant

## 2017-04-04 ENCOUNTER — Encounter: Payer: Self-pay | Admitting: Neurology

## 2017-04-04 ENCOUNTER — Ambulatory Visit (INDEPENDENT_AMBULATORY_CARE_PROVIDER_SITE_OTHER): Payer: 59 | Admitting: Neurology

## 2017-04-04 VITALS — BP 145/92 | HR 80 | Ht 62.0 in | Wt 222.0 lb

## 2017-04-04 DIAGNOSIS — F519 Sleep disorder not due to a substance or known physiological condition, unspecified: Secondary | ICD-10-CM

## 2017-04-04 DIAGNOSIS — F5109 Other insomnia not due to a substance or known physiological condition: Secondary | ICD-10-CM | POA: Diagnosis not present

## 2017-04-04 DIAGNOSIS — E1351 Other specified diabetes mellitus with diabetic peripheral angiopathy without gangrene: Secondary | ICD-10-CM

## 2017-04-04 DIAGNOSIS — G4733 Obstructive sleep apnea (adult) (pediatric): Secondary | ICD-10-CM

## 2017-04-04 DIAGNOSIS — Z6841 Body Mass Index (BMI) 40.0 and over, adult: Secondary | ICD-10-CM | POA: Diagnosis not present

## 2017-04-04 DIAGNOSIS — I10 Essential (primary) hypertension: Secondary | ICD-10-CM | POA: Diagnosis not present

## 2017-04-04 DIAGNOSIS — E661 Drug-induced obesity: Secondary | ICD-10-CM

## 2017-04-04 NOTE — Patient Instructions (Signed)

## 2017-04-04 NOTE — Progress Notes (Signed)
SLEEP MEDICINE CLINIC   Provider:  Larey Seat, M D  Primary Care Physician:  Unk Pinto, MD   Referring Provider: Unk Pinto, MD    Chief Complaint  Patient presents with  . New Patient (Initial Visit)    pt alone, rm 10. pt had a sleep study done 3-4years ago. pt was told at that time it was mild sleep apnea. pt husband states she snores,pt questions sleep paralysis.     HPI:  Tracy Garrett is a 61 y.o. female , seen here as in a referral from Dr. Melford Aase for an evaluation of sleep disturbance,  She had been evaluated in a sleep study a [facility].  In September 2015 she was diagnosed with OSA, AHI was under 10. She had an RDI above 10. She also has hit a plateau in her weight loss and wondered if sleep or lack thereof can be the cause.    Sleep habits are as follows: The patient's bedtime is not before 11 PM, and she usually watches television. She usually does not sleep when watching television. She does state that she has a TV in the bedroom and that is often on when she tries to fall asleep. The bedroom is otherwise cool quiet and dark. At the time she is asleep she will stay asleep for about a couple of hours, usually because she has trouble breathing related to rhinitis, sinusitis or bronchitic phlegm accumulation. She has been told by her husband that she snores but she has also experienced snoring herself waking her but she was just relaxed but not yet in sleep. Her husband has noted jerking after a breathing pause. She does not dream a lot.  She does not have frequent nocturia. She usually wakes up spontaneously before 7 AM, but she usually rises at 7:30 AM. She reports that her eyes feel often heavy and burning, she does not feel restored and refreshed as she wants to. She does not want to get up. She has experienced sleep paralysis on occasion- the feeling that she could not open her eyes could not move her body but was awake and alert enough to  hear every sound.   Sleep medical history and family sleep history:  The patient is not aware of any family member struggling with a sleep disorder. She also did not have sleep disorders in childhood.Her eldest son just told her that he has experienced sleep paralysis in childhood. He never shared until recently. DM, HTN, Obesity, OSA.  Had tonsil an adenoid ectomy, nasal congestion.    Social history: The patient is married, she retired after 19 years in [employer], now works part-time for [employer]. The patient has never used tobacco products, she does not drink any alcohol, she does not drink coffee, she may have ice tea maybe twice a week, sometimes soda, no energy drinks.  Review of Systems: Out of a complete 14 system review, the patient complains of only the following symptoms, and all other reviewed systems are negative.  snoring, choking,  Sleep paralysis. OSA. Obesity .ongoing weight gain.  Epworth score 5  , Fatigue severity score 12  , depression score n/a    Social History   Social History  . Marital status: Married    Spouse name: N/A  . Number of children: N/A  . Years of education: N/A   Occupational History  . Not on file.   Social History Main Topics  . Smoking status: Never Smoker  .  Smokeless tobacco: Never Used  . Alcohol use No  . Drug use: No  . Sexual activity: Not on file   Other Topics Concern  . Not on file   Social History Narrative  . No narrative on file    Family History  Problem Relation Age of Onset  . Cancer Mother     Past Medical History:  Diagnosis Date  . Allergy   . Cataracts, bilateral   . Diabetes mellitus without complication ([place])   . History of kidney stones    x2 episodes  . Hyperlipidemia   . PONV (postoperative nausea and vomiting)   . Sleep apnea    test was dx, as mild- no further tx. required  . Type II or unspecified type diabetes mellitus without mention of complication, not stated as  uncontrolled     Past Surgical History:  Procedure Laterality Date  . BREAST SURGERY  1999   Reduction Mammoplasties  . CHOLECYSTECTOMY  2003  . colonoscopsy  11/2007   neg- polyp removal with both procedures x2  . EUS N/A 07/26/2014   Procedure: UPPER ENDOSCOPIC ULTRASOUND (EUS) LINEAR;  Surgeon: Beryle Beams, MD;  Location: [facility];  Service: Endoscopy;  Laterality: N/A;  . kidney stones     surgery to extract x 1  . 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 LYSIS OF ADHESION  10/10/2014   Procedure: LYSIS OF ADHESION;  Surgeon: [name], MD;  Location: [facility];  Service: General;;  . TONSILLECTOMY     and adenoids removed age 36  . VAGINAL HYSTERECTOMY  2002    Current Outpatient Prescriptions  Medication Sig Dispense Refill  . acetaminophen (TYLENOL) 500 MG tablet Take 1,000 mg by mouth every 6 (six) hours as needed for mild pain.    Marland Kitchen atorvastatin (LIPITOR) 40 MG tablet 1 tablet daily or as directed  For Cholesterol 30 tablet 4  . Cholecalciferol (VITAMIN D3) 5000 UNITS TABS Take 1 tablet by mouth every other day.     . diphenhydrAMINE (BENADRYL) 25 mg capsule Take 25 mg by mouth every 6 (six) hours as needed for itching.    Marland Kitchen glucose blood (CONTOUR NEXT TEST) test strip Check blood sugar 1 time daily-DX-E11.9 100 each 12  . hydrocortisone cream 1 % Apply 1 application topically 3 (three) times daily as needed for itching.    . metFORMIN (GLUCOPHAGE-XR) 500 MG 24 hr tablet TAKE 1 TABLET 2 X DAY AFTER BREAKFAST &LUNCH AND 2 TABLETS AFTER SUPPER FOR DIABETES 360 tablet 1  . Omega-3 Fatty Acids (OMEGA 3 PO) Take 1 capsule by mouth 2 (two) times daily.    . pantoprazole (PROTONIX) 40 MG tablet TAKE 1 TABLET BY MOUTH EVERY DAY 90 tablet 1  . gabapentin (NEURONTIN) 300 MG capsule Take 1 tablet 3 x day for pain 90 capsule 2    No current facility-administered medications for this visit.     Allergies as of 04/04/2017 - Review Complete 04/04/2017  Allergen Reaction Noted  . Other  06/05/2014  . Hydromorphone Rash 10/10/2014    Vitals: BP (!) 145/92   Pulse 80   Ht 5\' 2"  (1.575 m)   Wt 222 lb (100.7 kg)   BMI 40.60 kg/m  Last Weight:  Wt Readings from Last 1 Encounters:  04/04/17 222 lb (100.7 kg)   ACZ:YSAY mass index is 40.6  kg/m.     Last Height:   Ht Readings from Last 1 Encounters:  04/04/17 5\' 2"  (1.575 m)    Physical exam:  General: The patient is awake, alert and appears not in acute distress. The patient is well groomed. Head: Normocephalic, atraumatic. Neck is supple. Mallampati  4  neck circumference:15 inches . Nasal airflow patent, Retrognathia is not seen  No bruxism marks.  Cardiovascular:  Regular rate and rhythm , without  murmurs or carotid bruit, and without distended neck veins. Respiratory: Lungs are clear to auscultation. Skin:  Without evidence of edema, or rash Trunk: BMI is over 40. The patient's posture is erect ,  Neurologic exam : Attention span & concentration ability appears normal.  Speech is fluent,  without dysarthria, dysphonia or aphasia.  Mood and affect are appropriate.  Cranial nerves: Pupils are equal and briskly reactive to light.  Funduscopic exam without evidence of pallor or edema. Extraocular movements  in vertical and horizontal planes intact and without nystagmus. Visual fields by finger perimetry are intact.Hearing to finger rub intact. Facial sensation intact to fine touch. Facial motor strength is symmetric and tongue and uvula move midline. Shoulder shrug was symmetrical.   Motor exam: Normal tone, muscle bulk and symmetric strength in all extremities. Sensory:  Fine touch, pinprick and vibration were tested in all extremities.  Coordination: Finger-to-nose maneuver  normal without evidence of ataxia, dysmetria or tremor. Gait and station:  Patient walks without assistive device and is able unassisted to climb up to the exam table. Strength within normal limits. Stance is stable and normal. Turns with  3  Steps. Romberg testing is negative. Deep tendon reflexes: in the  upper and lower extremities are symmetric and intact. Babinski maneuver response is  downgoing.  Assessment:  After physical and neurologic examination, review of laboratory studies,  Personal review of imaging studies, reports of other /same  Imaging studies, results of polysomnography and / or neurophysiology testing and pre-existing records as far as provided in visit., my assessment is   1)  Obstructive sleep apnea. I strongly suspect that Tracy Garrett will test positive for this condition, based on weight gain since her sleep study 3 years ago which placed her just below AHI 10. In addition she feels more fatigued, she has in the morning not the feeling that she got refreshed or restored by sleep.   2)She did not endorse a high degree of excessive sleepiness on her Epworth score, but reported interestingly that she has sleep paralysis on occasion and that her son also has experienced this.   I suspect that she does not get quality sleep, lacks especially the deepest non-REM sleep stages. In addition, her hypnogram from 3 years ago indicated that her apnea was strictly related to REM sleep and thus REM sleep was fragmented.  Obesity, HTN and DM are co-morbidities that affect her degree of daytime fatigue.  The patient was advised of the nature of the diagnosed disorder , the treatment options and the  risks for general health and wellness arising from not treating the condition.   I spent more than 45 minutes of face to face time with the patient.  Greater than 50% of time was spent in counseling and coordination of care. We have discussed the diagnosis and differential and I answered the patient's questions.    Plan:  Treatment plan and additional workup : SPLIT   Study .   Watch for peak  Co2, duration above 40 torr,  and hypoxemia.  Larey Seat, MD 22/41/1464, 3:14 PM  Certified in Neurology by ABPN Certified in [place] by [facility] [address] [place], [place] [address]

## 2017-05-16 ENCOUNTER — Telehealth: Payer: Self-pay | Admitting: *Deleted

## 2017-05-16 NOTE — Telephone Encounter (Signed)
Called and spoke with patient. I informed her that the office will be closed tomorrow 12/11 and we will call ASAP to reschedule. She verbalized understanding.

## 2017-05-17 ENCOUNTER — Ambulatory Visit: Payer: Self-pay | Admitting: Neurology

## 2017-05-18 NOTE — Telephone Encounter (Signed)
Called and spoke with pt. I r/s her for Thursday 12/13 @ 4:30 with an arrival time of 4:00. She verbalized understanding and appreciation.

## 2017-05-19 ENCOUNTER — Ambulatory Visit: Payer: 59 | Admitting: Neurology

## 2017-05-19 ENCOUNTER — Encounter: Payer: Self-pay | Admitting: Neurology

## 2017-05-19 ENCOUNTER — Ambulatory Visit: Payer: Self-pay | Admitting: Neurology

## 2017-05-19 VITALS — BP 142/86 | HR 93 | Ht 62.75 in | Wt 223.4 lb

## 2017-05-19 DIAGNOSIS — R29898 Other symptoms and signs involving the musculoskeletal system: Secondary | ICD-10-CM

## 2017-05-19 DIAGNOSIS — R269 Unspecified abnormalities of gait and mobility: Secondary | ICD-10-CM | POA: Diagnosis not present

## 2017-05-19 DIAGNOSIS — M25562 Pain in left knee: Secondary | ICD-10-CM

## 2017-05-19 DIAGNOSIS — Z9181 History of falling: Secondary | ICD-10-CM

## 2017-05-19 NOTE — Progress Notes (Signed)
[facility]    Provider:  Dr Jaynee Eagles Referring Provider: Unk Pinto, MD Primary Care Physician:  Unk Pinto, MD  CC:  Left leg weakness  HPI:  Tracy Garrett is a 61 y.o. female here as a referral from Dr. Melford Aase for leg pain and weakness. Pain in her leg started years ago, left outer lower leg. Afew months ago she was getting out of the car and her left knee gave out, felt weak, then pain around the knee and radiating up to the buttocks. Just the left leg. 1-2 years ago her legs felt numb, went away, she was standing and felt it in both thighs positional outer thighs goes away briefly. She has pain in the left leg. She has a lot of tenderness around the knee and medial knee area. She has pain in the knee. Aching. The pain/burning in the lower lateral extremity happens every now and then, the medial and knee pain is more often. No other focal neurologic deficits, associated symptoms, inciting events or modifiable factors. No changes in bowel or bladder, claudication symptoms, saddle anesthesia.   Review of Systems: Patient complains of symptoms per HPI as well as the following symptoms: leg pain, weakness. Pertinent negatives and positives per HPI. All others negative.   Social History   Socioeconomic History  . Marital status: Married    Spouse name: Not on file  . Number of children: 2  . Years of education: 4 years of college  . Highest education level: Some college, no degree  Social Needs  . Financial resource strain: Not on file  . Food insecurity - worry: Not on file  . Food insecurity - inability: Not on file  . Transportation needs - medical: Not on file  . Transportation needs - non-medical: Not on file  Occupational History  . Not on file  Tobacco Use  . Smoking status: Never Smoker  . Smokeless tobacco: Never Used  . Tobacco comment: tried cigarettes one time in college  Substance and Sexual Activity  . Alcohol use: No  . Drug  use: No  . Sexual activity: Not on file  Other Topics Concern  . Not on file  Social History Narrative   Lives at home with her husband and youngest son   Right handed   Drinks occasional tea, not daily    Family History  Problem Relation Age of Onset  . Cancer Mother   . Breast cancer Mother     Past Medical History:  Diagnosis Date  . Allergy   . Cataracts, bilateral   . Diabetes mellitus without complication ([place])   . History of kidney stones    x2 episodes  . Hyperlipidemia   . PONV (postoperative nausea and vomiting)   . Sleep apnea    test was dx, as mild- no further tx. required  . Type II or unspecified type diabetes mellitus without mention of complication, not stated as uncontrolled     Past Surgical History:  Procedure Laterality Date  . BREAST SURGERY  1999   Reduction Mammoplasties  . CHOLECYSTECTOMY  2003  . colonoscopsy  11/2007   neg- polyp removal with both procedures x2  . EUS N/A 07/26/2014   Procedure: UPPER ENDOSCOPIC ULTRASOUND (EUS) LINEAR;  Surgeon: Beryle Beams, MD;  [facility];  Service: Endoscopy;  Laterality: N/A;  . kidney stones     surgery to extract x 1  . LAPAROTOMY N/A 10/10/2014   Procedure: EXPLORATORY LAPAROTOMY EXCISION OF 4mm DUODENAL  CARCINOID TUMOR;  Surgeon: [name], MD;  Location: [facility];  Service: General;  Laterality: N/A;  . LYSIS OF ADHESION  10/10/2014   Procedure: LYSIS OF ADHESION;  Surgeon: [name], MD;  Location: [facility];  Service: General;;  . TONSILLECTOMY     and adenoids removed age 62  . VAGINAL HYSTERECTOMY  2002    Current Outpatient Medications  Medication Sig Dispense Refill  . acetaminophen (TYLENOL) 500 MG tablet Take 1,000 mg by mouth every 6 (six) hours as needed for mild pain.    Marland Kitchen atorvastatin (LIPITOR) 40 MG tablet 1 tablet daily or as directed  For Cholesterol 30 tablet 4  . Cholecalciferol (VITAMIN D3) 5000 UNITS TABS Take 1 tablet by mouth every other day.     .  diphenhydrAMINE (BENADRYL) 25 mg capsule Take 25 mg by mouth every 6 (six) hours as needed for itching.    Marland Kitchen glucose blood (CONTOUR NEXT TEST) test strip Check blood sugar 1 time daily-DX-E11.9 100 each 12  . hydrocortisone cream 1 % Apply 1 application topically 3 (three) times daily as needed for itching.    . metFORMIN (GLUCOPHAGE-XR) 500 MG 24 hr tablet TAKE 1 TABLET 2 X DAY AFTER BREAKFAST &LUNCH AND 2 TABLETS AFTER SUPPER FOR DIABETES 360 tablet 1  . Omega-3 Fatty Acids (OMEGA 3 PO) Take 1 capsule by mouth 2 (two) times daily.    . pantoprazole (PROTONIX) 40 MG tablet TAKE 1 TABLET BY MOUTH EVERY DAY 90 tablet 1  . gabapentin (NEURONTIN) 300 MG capsule Take 1 tablet 3 x day for pain 90 capsule 2   No current facility-administered medications for this visit.     Allergies as of 05/19/2017 - Review Complete 05/19/2017  Allergen Reaction Noted  . Other  06/05/2014  . Hydromorphone Rash 10/10/2014    Vitals: BP (!) 142/86 (BP Location: Right Arm, Patient Position: Sitting)   Pulse 93   Ht 5' 2.75" (1.594 m)   Wt 223 lb 6.4 oz (101.3 kg)   BMI 39.89 kg/m  Last Weight:  Wt Readings from Last 1 Encounters:  05/19/17 223 lb 6.4 oz (101.3 kg)   Last Height:   Ht Readings from Last 1 Encounters:  05/19/17 5' 2.75" (1.594 m)    Physical exam: Exam: Gen: NAD, conversant, well nourised, morbidly obese, well groomed                     CV: RRR, no MRG. No Carotid Bruits. No peripheral edema, warm, nontender Eyes: Conjunctivae clear without exudates or hemorrhage  Neuro: Detailed Neurologic Exam  Speech:    Speech is normal; fluent and spontaneous with normal comprehension.  Cognition:    The patient is oriented to person, place, and time;     recent and remote memory intact;     language fluent;     normal attention, concentration,     fund of knowledge Cranial Nerves:    The pupils are equal, round, and reactive to light. The fundi are normal and spontaneous venous  pulsations are present. Visual fields are full to finger confrontation. Extraocular movements are intact. Trigeminal sensation is intact and the muscles of mastication are normal. The face is symmetric. The palate elevates in the midline. Hearing intact. Voice is normal. Shoulder shrug is normal. The tongue has normal motion without fasciculations.   Coordination:    Normal finger to nose and heel to shin. Normal rapid alternating movements.   Gait:    Antalgic and  wide based likely due to left knee pain and morbid obesity  Motor Observation:    No asymmetry, no atrophy, and no involuntary movements noted. Tone:    Normal muscle tone.    Posture:    Posture is normal. normal erect    Strength:    Left biceps femoris weakness 4/5 otherwise nml     Sensation: intact to LT     Reflex Exam:  DTR's:    Left absent AJ Toes:    The toes are downgoing bilaterally.   Clonus:    Clonus is absent.   Assessment/Plan:  [age] year old with left leg weakness. 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 Discussed options, including MRI L-spine, PT, epidural steroid injections, EMG/NCS and that she may have several etiologies ongoing, lumbar degenerative disease,arthritis in the knee, lumbar radiculopathy and morbid obesity.   PT for left leg pain, knee pain (evaluate for brace), weakness left leg, lumbar radic and left lower extremity giveway. Fall risk, eval and treat.  Morbid obesity: Refer to [employer] at [facility]  If no [facility] return for emg/ncs and MRI lumbar spine  F/u with pcp for knee pain and likely arthritis there  Orders Placed This Encounter  Procedures  . Ambulatory referral to Physical Therapy  . Ambulatory referral to Family Practice     Cc: Unk Pinto, MD  Sarina Ill, MD  [facility] [address] [place], [place] [address]  Phone [phone] Fax  [phone]  A total of 45 minutes was spent face-to-face with this patient. Over half this time was spent on counseling patient on the left leg weakness and pain, gait abnormaity, fall risk, knee pain diagnosis and different diagnostic and therapeutic options available.

## 2017-05-19 NOTE — Patient Instructions (Signed)
Physical therapy. 

## 2017-06-02 ENCOUNTER — Ambulatory Visit: Payer: Self-pay | Admitting: Adult Health

## 2017-06-21 ENCOUNTER — Ambulatory Visit (INDEPENDENT_AMBULATORY_CARE_PROVIDER_SITE_OTHER): Payer: 59 | Admitting: Neurology

## 2017-06-21 DIAGNOSIS — G4733 Obstructive sleep apnea (adult) (pediatric): Secondary | ICD-10-CM

## 2017-06-21 DIAGNOSIS — F519 Sleep disorder not due to a substance or known physiological condition, unspecified: Secondary | ICD-10-CM

## 2017-06-21 DIAGNOSIS — E661 Drug-induced obesity: Secondary | ICD-10-CM

## 2017-06-21 DIAGNOSIS — E1351 Other specified diabetes mellitus with diabetic peripheral angiopathy without gangrene: Secondary | ICD-10-CM

## 2017-06-21 DIAGNOSIS — F5109 Other insomnia not due to a substance or known physiological condition: Secondary | ICD-10-CM

## 2017-06-21 DIAGNOSIS — I10 Essential (primary) hypertension: Secondary | ICD-10-CM

## 2017-06-21 DIAGNOSIS — Z6841 Body Mass Index (BMI) 40.0 and over, adult: Secondary | ICD-10-CM

## 2017-06-26 NOTE — Procedures (Signed)
PATIENT'S NAME:  Tracy Garrett, Tracy Garrett DOB:      15-Mar-1956      MR#:    [id number]     DATE OF RECORDING: 06/21/2017 REFERRING M.D.:  Unk Pinto, M.D. Study Performed:   Baseline Polysomnogram HISTORY:  This 62 y.o. female patient of Dr. Idell Pickles is seen for an evaluation of sleep disturbance.  She had been evaluated at [facility] in September 2015, and was diagnosed with mild OSA, the AHI was under 10/hr. RDI just above 10/hr. She was asked to lose weight to treat this mild apnea.  She has hit a plateau in her weight loss and wondered if sleep or lack thereof can be the cause. Her husband has told her that she snores, she has also woken up by her snores. Her husband has noted jerking after a breathing pause. She does not dream. She does not want to get up in AM. She has experienced sleep paralysis on occasion- the feeling that she could not open her eyes could not move her body but was awake and alert enough to hear every sound.  The patient endorsed the Epworth Sleepiness Scale at 5 points.   The patient's weight 222 pounds with a height of 62 (inches), resulting in a BMI of 41.3 kg/m2. The patient's neck circumference measured 185 inches.  CURRENT MEDICATIONS: Lipitor, Benadryl, Glucose test strips, Glucophage, Protonix, Neurontin   PROCEDURE:  This is a multichannel digital polysomnogram utilizing the Somnostar 11.2 system.  Electrodes and sensors were applied and monitored per AASM Specifications.   EEG, EOG, Chin and Limb EMG, were sampled at 200 Hz.  ECG, Snore and Nasal Pressure, Thermal Airflow, Respiratory Effort, CPAP Flow and Pressure, Oximetry was sampled at 50 Hz. Digital video and audio were recorded.      BASELINE STUDY   Lights Out was at 22:48 and Lights On at 05:24.  Total recording time (TRT) was 397 minutes, with a total sleep time (TST) of 371.5 minutes.   The patient's sleep latency was 23.5 minutes.  REM latency was 102 minutes.  The sleep efficiency was 93.6  %.     SLEEP ARCHITECTURE: WASO (Wake after sleep onset) was 12.5 minutes.  There were 10 minutes in Stage N1, 181.5 minutes Stage N2, 93 minutes Stage N3 and 87 minutes in Stage REM.  The percentage of Stage N1 was 2.7%, Stage N2 was 48.9%, and Stage N3 was 25.0%, and Stage R (REM sleep) was 23.4%.   RESPIRATORY ANALYSIS:  There were a total of 12 respiratory events:  3 obstructive apneas, 0 central apneas and 0 mixed apneas with a total of 3 apneas and 9 hypopneas. The patient had 0 respiratory event related arousals (RERAs).    The total APNEA/HYPOPNEA INDEX (AHI) was 1.9/hour and the total RESPIRATORY DISTURBANCE INDEX was 1.9 /hour. 12 events occurred in REM sleep and 0 events in NREM. The REM AHI was 8.3 /hour, versus a non-REM AHI of 0. The patient spent 0 minutes of total sleep time in the supine position and 372 minutes in non-supine. The supine AHI was 0.0 versus a non-supine AHI of 2.0.  OXYGEN SATURATION & C02:  The Wake baseline 02 saturation was 98%, with the lowest being 77%. Time spent below 89% saturation equaled 3 minutes.  End Tidal CO2 during sleep was 46.8 torr.  During REM, the peak End Tidal CO2 was 47.9 torr.  During NREM, it was 46.5 torr.  Total sleep time greater than 40 torr was 98.80 minutes. No  time at CO2 at or greater than 50 torr.    PERIODIC LIMB MOVEMENTS:  The patient had a total of 10 Periodic Limb Movements.  The Periodic Limb Movement (PLM) index was 1.6 and the PLM Arousal index was 1.6/hour. The arousals were noted as: 39 were spontaneous, 10 were associated with PLMs, and 4 were associated with respiratory events. Audio and video analysis did not show any abnormal or unusual movements, behaviors, phonations or vocalizations.  No nocturia. Mild, constant snoring was noted. EKG was in keeping with normal sinus rhythm (NSR) and isolated PVCs.  Post-study, the patient indicated that sleep was the same as usual and that she didn't struggle to get up at 5 AM.    IMPRESSION: 1. No significant Obstructive Sleep Apnea (OSA) was noted, but the patient slept all night non-supine. 2. No prolonged hypoxemia and only borderline hypercapnia noted.   3. Mild Snoring only (in non- supine sleep position) .   RECOMMENDATIONS:  1. Advise just weight loss- there is no need for dental device or CPAP intervention as long as the patient can sleep on her side.  She may benefit from a weight management program.  2. Consider dedicated sleep psychology referral if insomnia is of clinical concern.   3. A follow up appointment can be scheduled in the Sleep Clinic at [facility]. The referring provider will be notified of the results.      I certify that I have reviewed the entire raw data recording prior to the issuance of this report in accordance with the Standards of Accreditation of the American Academy of Sleep Medicine (AASM)     Larey Seat, MD      06-26-2017  Diplomat, American Board of Psychiatry and Neurology  Diplomat, American Board of Sleep Medicine Medical Director of  [employer] [facility] [employer]

## 2017-06-28 ENCOUNTER — Telehealth: Payer: Self-pay | Admitting: Neurology

## 2017-06-28 NOTE — Telephone Encounter (Signed)
Called the patient and made her aware that her sleep study was negative for sleep apnea. The test showed mild snoring. Dr Dohmeier is recommending weight loss. She offered a dental referral for a dental device in the event that mild snoring is bothersome. Pt declined at this time. She wanted to mention that the night of the study she had a pain in her head. She will inform her PCP of this at the next apt. Pt verbalized understanding. Pt had no questions at this time but was encouraged to call back if questions arise.

## 2017-06-28 NOTE — Telephone Encounter (Signed)
-----   Message from Larey Seat, MD sent at 06/26/2017  2:11 PM EST ----- Snoring was mild and apnea was not seen- I recommend further weight loss and to continue to sleep on the side. The patient can be treated with a dental device if the mild snoring is a problem for her or her husband. Intervention with CPAP is not indicated.

## 2017-07-04 NOTE — Progress Notes (Signed)
FOLLOW UP  Assessment and Plan:   Hypertension Well controlled without medications Monitor blood pressure at home; patient to call if consistently greater than 130/80 Continue DASH diet.   Reminder to go to the ER if any CP, SOB, nausea, dizziness, severe HA, changes vision/speech, left arm numbness and tingling and jaw pain.  Cholesterol Currently above goal; she is strongly opposed to statin despite discussion of risks and benefits. Wants to try "natural" methods - advised red rice yeast with taper up, omega 3/fish oil, fiber supplement with guidelines for dietary improvement Continue low cholesterol diet and exercise.  Check lipid panel.   Diabetes without complications Continue medication: metformin 500 mg 4 tabs daily  Continue diet and exercise, efforts with weight loss Perform daily foot/skin check, notify office of any concerning changes.  Check A1C  Obesity with co morbidities Long discussion about weight loss, diet, and exercise Recommended diet heavy in fruits and veggies and low in animal meats, cheeses, and dairy products, appropriate calorie intake Discussed ideal weight for height  Continue with good dietary choices that she has been following - discussed moderation and ability to maintain long term is important rather than quick weight loss Will follow up in 3 months  Vitamin D Def At goal at last visit; continue supplementation to maintain goal of 70-100 Defer Vit D level  Continue diet and meds as discussed. Further disposition pending results of labs. Discussed med's effects and SE's.   Over 30 minutes of exam, counseling, chart review, and critical decision making was performed.   Future Appointments  Date Time Provider [place]  09/01/2017  2:30 PM Unk Pinto, MD [facility] None  03/23/2018  3:00 PM Unk Pinto, MD [facility] None     ----------------------------------------------------------------------------------------------------------------------  HPI 62 y.o. female  presents for 3 month follow up on hypertension, cholesterol, diabetes, obesity and vitamin D deficiency. She has recently had a repeat sleep study which was negative for OSA. She was recommended to continue with weight loss. She does have ongoing difficulty sleeping/staying asleep - she is not currently taking anything for this. She is prescribed gabapentin for pain but has not been taking - suggested she try 1-2 tabs of this at night for sleep, also consider benadryl and melatonin. Sleep hygiene discussed.   BMI is Body mass index is 38.75 kg/m., she has been working on diet but she reports ongoing knee pain and "locking" issues that somewhat limits her. She reports she is reducing her carb intake, increasing water intake. She cut out sweets, bread and red meat intermittent.  Wt Readings from Last 3 Encounters:  07/05/17 217 lb (98.4 kg)  05/19/17 223 lb 6.4 oz (101.3 kg)  04/04/17 222 lb (100.7 kg)   Her blood pressure has been controlled at home, today their BP is BP: 124/76  She does not workout. She denies chest pain, shortness of breath, dizziness.   She is prescribed cholesterol medication but is strongly opposed to taking and has not been doing so. She denies every noting myalgias with the medication. She is requesting lifestyle modification with "natural" interventions for her cholesterol.  Her cholesterol is not at goal. The cholesterol last visit was:   Lab Results  Component Value Date   CHOL 223 (H) 02/28/2017   HDL 54 02/28/2017   LDLCALC 163 (H) 10/28/2016   TRIG 164 (H) 02/28/2017   CHOLHDL 4.1 02/28/2017    She has been working on diet for T2 diabetes, she has only been taking 2 tabs of 500  mg metformin at night - has been forgetting to take AM doses - and denies increased appetite, nausea, paresthesia of the feet, polydipsia,  polyuria, visual disturbances, vomiting and weight loss. Last A1C in the office was:  Lab Results  Component Value Date   HGBA1C 7.3 (H) 02/28/2017   Patient is on Vitamin D supplement and approaching goal of 70 at the last check:   Lab Results  Component Value Date   VD25OH 59 02/28/2017        Current Medications:  Current Outpatient Medications on File Prior to Visit  Medication Sig  . acetaminophen (TYLENOL) 500 MG tablet Take 1,000 mg by mouth every 6 (six) hours as needed for mild pain.  . Cholecalciferol (VITAMIN D3) 5000 UNITS TABS Take 1 tablet by mouth every other day.   . diphenhydrAMINE (BENADRYL) 25 mg capsule Take 25 mg by mouth every 6 (six) hours as needed for itching.  [place] Kitchen glucose blood (CONTOUR NEXT TEST) test strip Check blood sugar 1 time daily-[id number]  . hydrocortisone cream 1 % Apply 1 application topically 3 (three) times daily as needed for itching.  . metFORMIN (GLUCOPHAGE-XR) 500 MG 24 hr tablet TAKE 1 TABLET 2 X DAY AFTER BREAKFAST &LUNCH AND 2 TABLETS AFTER SUPPER FOR DIABETES  . Omega-3 Fatty Acids (OMEGA 3 PO) Take 1 capsule by mouth 2 (two) times daily.  . pantoprazole (PROTONIX) 40 MG tablet TAKE 1 TABLET BY MOUTH EVERY DAY  . atorvastatin (LIPITOR) 40 MG tablet 1 tablet daily or as directed  For Cholesterol (Patient not taking: Reported on 07/05/2017)  . gabapentin (NEURONTIN) 300 MG capsule Take 1 tablet 3 x day for pain   No current facility-administered medications on file prior to visit.      Allergies:  Allergies  Allergen Reactions  . Other     NUTS CAUSE MOUTH TO [place]  . Hydromorphone Rash    Arm reddened at IV med site     Medical History:  Past Medical History:  Diagnosis Date  . Allergy   . Cataracts, bilateral   . Diabetes mellitus without complication ([place])   . History of kidney stones    x2 episodes  . Hyperlipidemia   . PONV (postoperative nausea and vomiting)   . Sleep apnea    test was dx, as mild- no further tx.  required  . Type II or unspecified type diabetes mellitus without mention of complication, not stated as uncontrolled    Family history- Reviewed and unchanged Social history- Reviewed and unchanged   Review of Systems:  Review of Systems  Constitutional: Negative for malaise/fatigue and weight loss.  HENT: Negative for congestion, hearing loss, sore throat and tinnitus.   Eyes: Negative for blurred vision and double vision.  Respiratory: Positive for sputum production. Negative for cough, shortness of breath and wheezing.   Cardiovascular: Negative for chest pain, palpitations, orthopnea, claudication and leg swelling.  Gastrointestinal: Negative for abdominal pain, blood in stool, constipation, diarrhea, heartburn, melena, nausea and vomiting.  Genitourinary: Negative.   Musculoskeletal: Negative for joint pain and myalgias.  Skin: Negative for rash.  Neurological: Negative for dizziness, tingling, sensory change, weakness and headaches.  Endo/Heme/Allergies: Negative for environmental allergies and polydipsia.  Psychiatric/Behavioral: Negative for depression and memory loss. The patient has insomnia. The patient is not nervous/anxious.   All other systems reviewed and are negative.    Physical Exam: BP 124/76   Pulse 86   Temp (!) 97.3 F (36.3 C)   Wt 217 lb (98.4  kg)   SpO2 97%   BMI 38.75 kg/m  Wt Readings from Last 3 Encounters:  07/05/17 217 lb (98.4 kg)  05/19/17 223 lb 6.4 oz (101.3 kg)  04/04/17 222 lb (100.7 kg)   General Appearance: Well nourished, in no apparent distress. Eyes: PERRLA, EOMs, conjunctiva no swelling or erythema Sinuses: No Frontal/maxillary tenderness ENT/Mouth: Ext aud canals clear, TMs without erythema, bulging. No erythema, swelling, or exudate on post pharynx.  Tonsils not swollen or erythematous. Hearing normal.  Neck: Supple, thyroid normal.  Respiratory: Respiratory effort normal, BS equal bilaterally without rales, rhonchi, wheezing or  stridor.  Cardio: RRR with no MRGs. Brisk peripheral pulses without edema.  Abdomen: Soft, + BS.  Non tender, no guarding, rebound, hernias, masses. Lymphatics: Non tender without lymphadenopathy.  Musculoskeletal: Full ROM, 5/5 strength, Normal gait Skin: Warm, dry without rashes, lesions, ecchymosis.  Neuro: Cranial nerves intact. No cerebellar symptoms.  Psych: Awake and oriented X 3, normal affect, Insight and Judgment appropriate.    Izora Ribas, NP 3:42 PM [facility]

## 2017-07-05 ENCOUNTER — Encounter: Payer: Self-pay | Admitting: Adult Health

## 2017-07-05 ENCOUNTER — Ambulatory Visit: Payer: 59 | Admitting: Adult Health

## 2017-07-05 VITALS — BP 124/76 | HR 86 | Temp 97.3°F | Wt 217.0 lb

## 2017-07-05 DIAGNOSIS — E119 Type 2 diabetes mellitus without complications: Secondary | ICD-10-CM | POA: Diagnosis not present

## 2017-07-05 DIAGNOSIS — E785 Hyperlipidemia, unspecified: Secondary | ICD-10-CM | POA: Diagnosis not present

## 2017-07-05 DIAGNOSIS — R29898 Other symptoms and signs involving the musculoskeletal system: Secondary | ICD-10-CM | POA: Diagnosis not present

## 2017-07-05 DIAGNOSIS — E559 Vitamin D deficiency, unspecified: Secondary | ICD-10-CM | POA: Diagnosis not present

## 2017-07-05 DIAGNOSIS — I10 Essential (primary) hypertension: Secondary | ICD-10-CM | POA: Diagnosis not present

## 2017-07-05 DIAGNOSIS — Z79899 Other long term (current) drug therapy: Secondary | ICD-10-CM

## 2017-07-05 NOTE — Patient Instructions (Addendum)
Consider starting red rice yeast rice supplement - start with 600 mg daily and gradually increase up to 1200 mg twice daily  Start on fish oil or omega 3 supplement daily  Consider adding a fiber supplement - Citracel or benefiber daliy     Fat and Cholesterol Restricted Diet Getting too much fat and cholesterol in your diet may cause health problems. Following this diet helps keep your fat and cholesterol at normal levels. This can keep you from getting sick. What types of fat should I choose?  Choose monosaturated and polyunsaturated fats. These are found in foods such as olive oil, canola oil, flaxseeds, walnuts, almonds, and seeds.  Eat more omega-3 fats. Good choices include salmon, mackerel, sardines, tuna, flaxseed oil, and ground flaxseeds.  Limit saturated fats. These are in animal products such as meats, butter, and cream. They can also be in plant products such as palm oil, palm kernel oil, and coconut oil.  Avoid foods with partially hydrogenated oils in them. These contain trans fats. Examples of foods that have trans fats are stick margarine, some tub margarines, cookies, crackers, and other baked goods. What general guidelines do I need to follow?  Check food labels. Look for the words "trans fat" and "saturated fat."  When preparing a meal: ? Fill half of your plate with vegetables and green salads. ? Fill one fourth of your plate with whole grains. Look for the word "whole" as the first word in the ingredient list. ? Fill one fourth of your plate with lean protein foods.  Eat more foods that have fiber, like apples, carrots, beans, peas, and barley.  Eat more home-cooked foods. Eat less at restaurants and buffets.  Limit or avoid alcohol.  Limit foods high in starch and sugar.  Limit fried foods.  Cook foods without frying them. Baking, boiling, grilling, and broiling are all great options.  Lose weight if you are overweight. Losing even a small amount of  weight can help your overall health. It can also help prevent diseases such as diabetes and heart disease. What foods can I eat? Grains Whole grains, such as whole wheat or whole grain breads, crackers, cereals, and pasta. Unsweetened oatmeal, bulgur, barley, quinoa, or brown rice. Corn or whole wheat flour tortillas. Vegetables Fresh or frozen vegetables (raw, steamed, roasted, or grilled). Green salads. Fruits All fresh, canned (in natural juice), or frozen fruits. Meat and Other Protein Products Ground beef (85% or leaner), grass-fed beef, or beef trimmed of fat. Skinless chicken or [place]. Ground chicken or [place]. Pork trimmed of fat. All fish and seafood. Eggs. Dried beans, peas, or lentils. Unsalted nuts or seeds. Unsalted canned or dry beans. Dairy Low-fat dairy products, such as skim or 1% milk, 2% or reduced-fat cheeses, low-fat ricotta or cottage cheese, or plain low-fat yogurt. Fats and Oils Tub margarines without trans fats. Light or reduced-fat mayonnaise and salad dressings. Avocado. Olive, canola, sesame, or safflower oils. Natural peanut or almond butter (choose ones without added sugar and oil). The items listed above may not be a complete list of recommended foods or beverages. Contact your dietitian for more options. What foods are not recommended? Grains White bread. White pasta. White rice. Cornbread. Bagels, pastries, and croissants. Crackers that contain trans fat. Vegetables White potatoes. Corn. Creamed or fried vegetables. Vegetables in a cheese sauce. Fruits Dried fruits. Canned fruit in light or heavy syrup. Fruit juice. Meat and Other Protein Products Fatty cuts of meat. Ribs, chicken wings, bacon, sausage, bologna, salami, chitterlings,  fatback, hot dogs, bratwurst, and packaged luncheon meats. Liver and organ meats. Dairy Whole or 2% milk, cream, half-and-half, and cream cheese. Whole milk cheeses. Whole-fat or sweetened yogurt. Full-fat cheeses. Nondairy  creamers and whipped toppings. Processed cheese, cheese spreads, or cheese curds. Sweets and Desserts Corn syrup, sugars, honey, and molasses. Candy. Jam and jelly. Syrup. Sweetened cereals. Cookies, pies, cakes, donuts, muffins, and ice cream. Fats and Oils Butter, stick margarine, lard, shortening, ghee, or bacon fat. Coconut, palm kernel, or palm oils. Beverages Alcohol. Sweetened drinks (such as sodas, lemonade, and fruit drinks or punches). The items listed above may not be a complete list of foods and beverages to avoid. Contact your dietitian for more information. This information is not intended to replace advice given to you by your health care provider. Make sure you discuss any questions you have with your health care provider. Document Released: 11/23/2011 Document Revised: 01/29/2016 Document Reviewed: 08/23/2013 Elsevier Interactive Patient Education  [employer].

## 2017-07-06 LAB — CBC WITH DIFFERENTIAL/PLATELET
BASOS ABS: 36 {cells}/uL (ref 0–200)
Basophils Relative: 0.4 %
EOS PCT: 2.3 %
Eosinophils Absolute: 207 cells/uL (ref 15–500)
HEMATOCRIT: 39.5 % (ref 35.0–45.0)
HEMOGLOBIN: 13.8 g/dL (ref 11.7–15.5)
LYMPHS ABS: 3564 {cells}/uL (ref 850–3900)
MCH: 30.1 pg (ref 27.0–33.0)
MCHC: 34.9 g/dL (ref 32.0–36.0)
MCV: 86.1 fL (ref 80.0–100.0)
MONOS PCT: 7.9 %
MPV: 10.5 fL (ref 7.5–12.5)
NEUTROS ABS: 4482 {cells}/uL (ref 1500–7800)
NEUTROS PCT: 49.8 %
Platelets: 299 10*3/uL (ref 140–400)
RBC: 4.59 10*6/uL (ref 3.80–5.10)
RDW: 13.9 % (ref 11.0–15.0)
Total Lymphocyte: 39.6 %
WBC mixed population: 711 cells/uL (ref 200–950)
WBC: 9 10*3/uL (ref 3.8–10.8)

## 2017-07-06 LAB — BASIC METABOLIC PANEL WITH GFR
BUN: 14 mg/dL (ref 7–25)
CALCIUM: 9.5 mg/dL (ref 8.6–10.4)
CO2: 29 mmol/L (ref 20–32)
CREATININE: 0.72 mg/dL (ref 0.50–0.99)
Chloride: 100 mmol/L (ref 98–110)
GFR, EST AFRICAN AMERICAN: 105 mL/min/{1.73_m2} (ref >=60)
GFR, Est Non African American: 90 mL/min/{1.73_m2} (ref >=60)
Glucose, Bld: 94 mg/dL (ref 65–99)
Potassium: 3.9 mmol/L (ref 3.5–5.3)
Sodium: 139 mmol/L (ref 135–146)

## 2017-07-06 LAB — HEMOGLOBIN A1C
Hgb A1c MFr Bld: 7.4 % of total Hgb — ABNORMAL HIGH (ref <5.7)
Mean Plasma Glucose: 166 (calc)
eAG (mmol/L): 9.2 (calc)

## 2017-07-06 LAB — TSH: TSH: 1.37 mIU/L (ref 0.40–4.50)

## 2017-07-06 LAB — HEPATIC FUNCTION PANEL
AG RATIO: 1.5 (calc) (ref 1.0–2.5)
ALBUMIN MSPROF: 4.4 g/dL (ref 3.6–5.1)
ALT: 24 U/L (ref 6–29)
AST: 19 U/L (ref 10–35)
Alkaline phosphatase (APISO): 75 U/L (ref 33–130)
BILIRUBIN DIRECT: 0.1 mg/dL (ref 0.0–0.2)
BILIRUBIN INDIRECT: 0.5 mg/dL (ref 0.2–1.2)
GLOBULIN: 3 g/dL (ref 1.9–3.7)
Total Bilirubin: 0.6 mg/dL (ref 0.2–1.2)
Total Protein: 7.4 g/dL (ref 6.1–8.1)

## 2017-07-06 LAB — LIPID PANEL
CHOL/HDL RATIO: 3.7 (calc) (ref <5.0)
CHOLESTEROL: 256 mg/dL — AB (ref <200)
HDL: 70 mg/dL (ref >50)
LDL Cholesterol (Calc): 153 mg/dL (calc) — ABNORMAL HIGH
NON-HDL CHOLESTEROL (CALC): 186 mg/dL — AB (ref <130)
Triglycerides: 194 mg/dL — ABNORMAL HIGH (ref <150)

## 2017-07-25 ENCOUNTER — Encounter: Payer: Self-pay | Admitting: Physical Therapy

## 2017-07-25 ENCOUNTER — Ambulatory Visit: Payer: 59 | Attending: Adult Health | Admitting: Physical Therapy

## 2017-07-25 DIAGNOSIS — M25561 Pain in right knee: Secondary | ICD-10-CM | POA: Insufficient documentation

## 2017-07-25 DIAGNOSIS — M79662 Pain in left lower leg: Secondary | ICD-10-CM | POA: Diagnosis present

## 2017-07-25 DIAGNOSIS — R262 Difficulty in walking, not elsewhere classified: Secondary | ICD-10-CM | POA: Insufficient documentation

## 2017-07-25 DIAGNOSIS — R29898 Other symptoms and signs involving the musculoskeletal system: Secondary | ICD-10-CM | POA: Insufficient documentation

## 2017-07-25 DIAGNOSIS — G8929 Other chronic pain: Secondary | ICD-10-CM | POA: Insufficient documentation

## 2017-07-26 NOTE — Addendum Note (Signed)
Addended by: Frazier Butt on: 07/26/2017 12:41 PM   Modules accepted: Orders

## 2017-07-26 NOTE — Therapy (Addendum)
[facility] [address] [place], [place], [address] Phone: [phone]   Fax:  [phone]  Physical Therapy Evaluation  Patient Details  Name: Tracy Garrett MRN: [id number] Date of Birth: 02-Apr-1956 Referring Provider: Liane Comber, NP   Encounter Date: 07/25/2017  PT End of Session - 07/26/17 0924    Visit Number  1    Number of Visits  9    Date for PT Re-Evaluation  09/24/17    Authorization Type  [employer]    PT Start Time  1403    PT Stop Time  1446    PT Time Calculation (min)  43 min    Activity Tolerance  Patient tolerated treatment well Did not report increase in pain at end of session    Behavior During Therapy  [facility] for tasks assessed/performed       Past Medical History:  Diagnosis Date  . Allergy   . Cataracts, bilateral   . Diabetes mellitus without complication ([place])   . History of kidney stones    x2 episodes  . Hyperlipidemia   . PONV (postoperative nausea and vomiting)   . Sleep apnea    test was dx, as mild- no further tx. required  . Type II or unspecified type diabetes mellitus without mention of complication, not stated as uncontrolled     Past Surgical History:  Procedure Laterality Date  . BREAST SURGERY  1999   Reduction Mammoplasties  . CHOLECYSTECTOMY  2003  . colonoscopsy  11/2007   neg- polyp removal with both procedures x2  . EUS N/A 07/26/2014   Procedure: UPPER ENDOSCOPIC ULTRASOUND (EUS) LINEAR;  Surgeon: Beryle Beams, MD;  Location: [facility] END[facility];  Service: Endoscopy;  Laterality: N/A;  . kidney stones     surgery to extract x 1  . 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 LYSIS OF ADHESION  10/10/2014   Procedure: LYSIS OF ADHESION;  Surgeon: [name], MD;  Location: [facility] ORS;  Service: General;;  .  TONSILLECTOMY     and adenoids removed age 56  . VAGINAL HYSTERECTOMY  2002    There were no vitals filed for this visit.   Subjective Assessment - 07/25/17 1405    Subjective  Pt reports occasional pains in outer L leg and outer L thigh, more constant pain along into medial aspect of knee for several months.  Pain has affected walking, causing a limp. Has had several episodes of L knee giving way. Does not use assistive device.    Patient Stated Goals  Pt's goals for physical therapy are to get rid of pain in leg.    Currently in Pain?  Yes    Pain Score  8     Pain Location  Leg    Pain Orientation  Left;Lateral;Lower    Pain Descriptors / Indicators  Aching    Pain Type  Chronic pain    Pain Onset  More than a month ago    Pain Frequency  Intermittent    Aggravating Factors   weather aggravates potentially    Pain Relieving Factors  Aspercreme helps somewhat    Multiple Pain Sites  Yes    Pain Score  9    Pain Location  Knee    Pain Orientation  Left    Pain Descriptors / Indicators  Aching  Pain Type  Chronic pain    Pain Radiating Towards  Does not report radiating, just pain in different spots at different times    Pain Onset  More than a month ago    Pain Frequency  Intermittent    Aggravating Factors   walking more    Pain Relieving Factors  Aspercreme         [facility] PT Assessment - 07/25/17 1415      Assessment   Medical Diagnosis  LLE pain    Referring Provider  Liane Comber, NP    Onset Date/Surgical Date  -- at least 3-4 months      Precautions   Precautions  Fall      Balance Screen   Has the patient fallen in the past 6 months  Yes    How many times?  1    Has the patient had a decrease in activity level because of a fear of falling?   Yes    Is the patient reluctant to leave their home because of a fear of falling?   Yes      [place] residence    Living Arrangements  Spouse/significant other    Available  Help at Discharge  Family    Type of [place] Access  Level entry    [place] - single point      Prior Function   Level of Independence  Independent    Vocation  Full time employment seated computer    Leisure  Enjoyed walking but has not done recenlty; has stationary bike but hasn't used lately      Observation/Other Assessments   Focus on Therapeutic Outcomes (FOTO)   NA      ROM / Strength   AROM / PROM / Strength  AROM;Strength      AROM   Overall AROM   Deficits    AROM Assessment Site  Knee    Right/Left Knee  Right;Left    Right Knee Extension  3 degrees from full extension    Right Knee Flexion  115    Left Knee Extension  8 degrees from full extension    Left Knee Flexion  113 with increased effort      Strength   Overall Strength Comments  Grossly tested RLE and LLE MMT 4+/5, with exception of L hip abduction 3+/5      Palpation   Palpation comment  Palpation along L iliotibial band, with tenderness to palpation and tight/trigger point areas noted along IT band.  Swelling noted LLE around knee and calf (pt reports not new, but she has not had fluid pill today); calf circumference RLE (10 cm below tibial plateau):  43 cm; LLE 45 cm      Transfers   Transfers  Sit to Stand;Stand to Sit    Sit to Stand  6: Modified independent (Device/Increase time);With upper extremity assist;From chair/3-in-1    Stand to Sit  6: Modified independent (Device/Increase time);With upper extremity assist;To chair/3-in-1      Ambulation/Gait   Ambulation/Gait  Yes    Ambulation/Gait Assistance  6: Modified independent (Device/Increase time) Slowed pace    Ambulation Distance (Feet)  150 Feet    Assistive device  None    Gait Pattern  Step-through pattern;Decreased stance time - left;Decreased step length - right;Antalgic;Lateral trunk lean to left    Ambulation  Surface  Level;Indoor    Gait velocity  13.5 sec = 2.43 ft/sec       Standardized Balance Assessment   Standardized Balance Assessment  Timed Up and Go Test      Timed Up and Go Test   Normal TUG (seconds)  13.94    TUG Comments  Scores >13.5 seconds indicate increased fall risk             Objective measurements completed on examination: See above findings.                   PT Long Term Goals - 07/26/17 1142      PT LONG TERM GOAL #1   Title  Pt will be independent with HEP for pain reduction, improved functional mobility, flexibility and strength.  TARGET 08/26/17    Time  4    Period  Weeks    Status  New    Target Date  08/26/17      PT LONG TERM GOAL #2   Title  Pt will demonstrate improved knee flexion and extension by 5 degrees for improved LLE flexibility.    Time  4    Period  Weeks    Status  New    Target Date  08/26/17      PT LONG TERM GOAL #3   Title  Pt will improve gait velocity to at least 2.62 ft/sec for improved community gait.    Time  4    Period  Weeks    Status  New    Target Date  08/26/17      PT LONG TERM GOAL #4   Title  Pt will improve TUG score to less than or equal to 13.5 seconds for decreased fall risk, improved funcitonal mobility.    Time  4    Period  Weeks    Status  New    Target Date  08/26/17      PT LONG TERM GOAL #5   Title  Pt will verbalize at least 3 means to reduce pain in L lower extremity for improved functional mobility.    Time  4    Period  Weeks    Status  New    Target Date  08/26/17             Plan - 07/26/17 0925    Clinical Impression Statement  Pt is a 62 year old female who presents to OP PT with c/o pain that is intermittent in LLE, at times in lateral lower leg, at times in medial knee area, and at times in lateral thigh area.  During PT eval, she denies any back pain and she denies radiating pain into lower extremities intiating from back.  Pain has been going on for at least several months, which is affecting pt's gait, and she has had several  episodes of feeling that L leg will give way.  She is at fall risk per TUG score and has slowed gait velocity (limited community ambulator range).  She demonstrates iliotibial band tightness, which may be contributing to some pain and gait deviations, and she has L hip weakness noted; in addition, she reports (and noted at eval) edema in LLE.  She has decreased flexibility in L knee.  She works full time and enjoys walking, but has been limited due to pain.   Pt would benefit from skilled PT to address LLE pain, weakness, decreased flexibility, gait deviations for improved painfree functional mobility.  History and Personal Factors relevant to plan of care:  >3 systems involvement, 1 fall in past 6 months; PMH >3 co-morbidities    Clinical Presentation  Evolving    Clinical Presentation due to:  fall risk per TUG score; pain limiting mobility (no formal diagnosis from MD)    Clinical Decision Making  Moderate    Rehab Potential  Good    PT Frequency  2x / week    PT Duration  4 weeks plus eval    PT Treatment/Interventions  ADLs/Self Care Home Management;Cryotherapy;DME Instruction;Moist Heat;Gait training;Functional mobility training;Therapeutic activities;Therapeutic exercise;Balance training;Patient/family education;Neuromuscular re-education;Manual techniques ; Ultrasound; Electrical Stimulation; Dry needling   PT Next Visit Plan  Iliotibial band massage and IT band stretch; hamstring stretch; flexibility and strengthening to LLE; gait training (?with cane) to offset antalgic pattern    Consulted and Agree with Plan of Care  Patient       Patient will benefit from skilled therapeutic intervention in order to improve the following deficits and impairments:  Abnormal gait, Decreased balance, Difficulty walking, Decreased mobility, Decreased strength, Impaired flexibility, Pain  Visit Diagnosis: Difficulty in walking, not elsewhere classified  Chronic pain of right knee  Other symptoms and  signs involving the musculoskeletal system  Pain in left lower leg     Problem List Patient Active Problem List   Diagnosis Date Noted  . Morbid obesity ([place]) 07/04/2017  . Vitamin D deficiency 09/02/2014  . 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 Fatty liver disease, nonalcoholic 88/04/314  . Essential hypertension 08/31/2013  . Medication management 08/31/2013  . T2_NIDDM   . Hyperlipidemia     Adarian Bur W. 07/26/2017, 12:37 PM  Frazier Butt., PT   [facility] [address] [place], [place], [address] Phone: [phone]   Fax:  [phone]  Name: Tracy Garrett MRN: [id number] Date of Birth: 1956/01/03   Please see addition of ultrasound, electrical stimulation, dry needling, as possible modalities to utilize for lower extremity tightness and pain. Mady Haagensen, PT 08/05/17 2:21 PM Phone: [phone] Fax: [phone]

## 2017-08-03 ENCOUNTER — Ambulatory Visit: Payer: 59 | Admitting: Physical Therapy

## 2017-08-03 ENCOUNTER — Encounter: Payer: Self-pay | Admitting: Physical Therapy

## 2017-08-03 DIAGNOSIS — M79662 Pain in left lower leg: Secondary | ICD-10-CM

## 2017-08-03 DIAGNOSIS — M25561 Pain in right knee: Principal | ICD-10-CM

## 2017-08-03 DIAGNOSIS — R262 Difficulty in walking, not elsewhere classified: Secondary | ICD-10-CM | POA: Diagnosis not present

## 2017-08-03 DIAGNOSIS — G8929 Other chronic pain: Secondary | ICD-10-CM

## 2017-08-03 NOTE — Patient Instructions (Addendum)
Bridge    Lying as shown: lift hips up off mat and hold for 5 seconds. Slowly lower hips back down.  Repeat _10_ times. Do 1-2 sessions per day.  http://pm.exer.us/55   Copyright  VHI. All rights reserved.   Strengthening: Hip Abductor - Resisted    Lying flat on bed: With band looped around both legs above knees, push thighs apart. Hold for 5 seconds.  Repeat _10_ times per set. Do _1_ sets per session. Do _1-2_ sessions per day.  http://orth.exer.us/688   Copyright  VHI. All rights reserved.    Clam Shell 45 Degrees    Lying on right side with hips and knees bent 45, one pillow between knees and ankles. Lift left knee. Be sure pelvis does not roll backward. Do not arch back. Do __10_ times.  _1-2_ times per day.  http://ss.exer.us/75   Copyright  VHI. All rights reserved.   Piriformis Stretch, Sitting    Sit, left ankle on opposite knee, left hand on left knee. Gently push down on knee, keeping spine straight. Lean torso forward, with flat back, until tension is felt in hamstrings and gluteals of crossed-leg side. Hold _30__ seconds.  Repeat _2-3__ times per session. Do 1-2 sessions per day.  Copyright  VHI. All rights reserved.    Chair Sitting    Sit at edge of seat, spine straight, one leg extended. Put a hand on each thigh and bend forward from the hip, keeping spine straight. Allow hand on extended leg to reach toward toes. Support upper body with other arm. Hold __30_ seconds. Repeat 2-3 times each leg per session. Do 1-2 sessions per day.  Copyright  VHI. All rights reserved.

## 2017-08-04 ENCOUNTER — Other Ambulatory Visit: Payer: Self-pay | Admitting: Physician Assistant

## 2017-08-04 DIAGNOSIS — G587 Mononeuritis multiplex: Secondary | ICD-10-CM

## 2017-08-04 MED ORDER — GABAPENTIN 300 MG PO CAPS: ORAL_CAPSULE | ORAL | 2 refills | Status: DC

## 2017-08-04 NOTE — Therapy (Signed)
[facility] [address] [place], [place], [address] Phone: [phone]   Fax:  [phone]  Physical Therapy Treatment  Patient Details  Name: Tracy Garrett MRN: [id number] Date of Birth: 02/22/1956 Referring Provider: Liane Comber, NP   Encounter Date: 08/03/2017  PT End of Session - 08/03/17 1624    Visit Number  2    Number of Visits  9    Date for PT Re-Evaluation  09/24/17    Authorization Type  [employer]    PT Start Time  1619    PT Stop Time  1658    PT Time Calculation (min)  39 min    Activity Tolerance  Patient tolerated treatment well Did not report increase in pain at end of session    Behavior During Therapy  [facility] for tasks assessed/performed       Past Medical History:  Diagnosis Date  . Allergy   . Cataracts, bilateral   . Diabetes mellitus without complication ([place])   . History of kidney stones    x2 episodes  . Hyperlipidemia   . PONV (postoperative nausea and vomiting)   . Sleep apnea    test was dx, as mild- no further tx. required  . Type II or unspecified type diabetes mellitus without mention of complication, not stated as uncontrolled     Past Surgical History:  Procedure Laterality Date  . BREAST SURGERY  1999   Reduction Mammoplasties  . CHOLECYSTECTOMY  2003  . colonoscopsy  11/2007   neg- polyp removal with both procedures x2  . EUS N/A 07/26/2014   Procedure: UPPER ENDOSCOPIC ULTRASOUND (EUS) LINEAR;  Surgeon: Beryle Beams, MD;  Location: [facility];  Service: Endoscopy;  Laterality: N/A;  . kidney stones     surgery to extract x 1  . LAPAROTOMY N/A 10/10/2014   Procedure: EXPLORATORY LAPAROTOMY EXCISION OF 23mm DUODENAL CARCINOID TUMOR;  Surgeon: [name], MD;  Location: [facility];  Service: General;  Laterality: N/A;  . LYSIS OF ADHESION  10/10/2014   Procedure: LYSIS OF ADHESION;  Surgeon: [name], MD;  Location: [facility];  Service: General;;  .  TONSILLECTOMY     and adenoids removed age 68  . VAGINAL HYSTERECTOMY  2002    There were no vitals filed for this visit.  Subjective Assessment - 08/03/17 1622    Subjective  No new complaints. No falls.     Patient Stated Goals  Pt's goals for physical therapy are to get rid of pain in leg.    Currently in Pain?  Yes    Pain Score  8     Pain Location  Leg    Pain Orientation  Lower;Lateral;Left    Pain Descriptors / Indicators  Other (Comment);Aching uncomfortable    Pain Type  Chronic pain    Pain Onset  More than a month ago    Pain Frequency  Intermittent    Aggravating Factors   unknown    Pain Relieving Factors  heat/cryo, Aspercreme helps some         08/03/17 1625  Manual Therapy  Manual Therapy Soft tissue mobilization;Other (comment);Myofascial release  Manual therapy comments all manual therapy performed with goal of decreased pain and decreased mm tightness. palpable trigger points noted. decreased mm tightness noted and reported after manual therapy.   Soft tissue mobilization to left IT band/lateral aspect of LE  Myofascial Release to left IT band and lateral aspect of left LE  Other  Manual Therapy use of foam roll to IT band intiating with light pressure and gradually increasing pressure as band relaxed and pt tolerated.     Issued the following to HEP today with cues on ex form/technique.    Bridge    Lying as shown: lift hips up off mat and hold for 5 seconds. Slowly lower hips back down.  Repeat _10_ times. Do 1-2 sessions per day.  http://pm.exer.us/55   Copyright  VHI. All rights reserved.   Strengthening: Hip Abductor - Resisted    Lying flat on bed: With band looped around both legs above knees, push thighs apart. Hold for 5 seconds.  Repeat _10_ times per set. Do _1_ sets per session. Do _1-2_ sessions per day.  http://orth.exer.us/688   Copyright  VHI. All rights reserved.    Clam Shell 45 Degrees    Lying on right side with hips  and knees bent 45, one pillow between knees and ankles. Lift left knee. Be sure pelvis does not roll backward. Do not arch back. Do __10_ times.  _1-2_ times per day.  http://ss.exer.us/75   Copyright  VHI. All rights reserved.   Piriformis Stretch, Sitting    Sit, left ankle on opposite knee, left hand on left knee. Gently push down on knee, keeping spine straight. Lean torso forward, with flat back, until tension is felt in hamstrings and gluteals of crossed-leg side. Hold _30__ seconds.  Repeat _2-3__ times per session. Do 1-2 sessions per day.  Copyright  VHI. All rights reserved.    Chair Sitting    Sit at edge of seat, spine straight, one leg extended. Put a hand on each thigh and bend forward from the hip, keeping spine straight. Allow hand on extended leg to reach toward toes. Support upper body with other arm. Hold __30_ seconds. Repeat 2-3 times each leg per session. Do 1-2 sessions per day.  Copyright  VHI. All rights reserved.       PT Education - 08/03/17 1652    Education provided  Yes    Education Details  HEP for hip strengthening and stretching; eventual return to community fitness for gentle yoga to continue to work on flexibility and benefits of water aerobics/walking    Person(s) Educated  Patient    Methods  Demonstration;Explanation;Verbal cues;Handout    Comprehension  Verbalized understanding;Returned demonstration;Verbal cues required;Need further instruction          PT Long Term Goals - 07/26/17 1142      PT LONG TERM GOAL #1   Title  Pt will be independent with HEP for pain reduction, improved functional mobility, flexibility and strength.  TARGET 08/26/17    Time  4    Period  Weeks    Status  New    Target Date  08/26/17      PT LONG TERM GOAL #2   Title  Pt will demonstrate improved knee flexion and extension by 5 degrees for improved LLE flexibility.    Time  4    Period  Weeks    Status  New    Target Date  08/26/17      PT  LONG TERM GOAL #3   Title  Pt will improve gait velocity to at least 2.62 ft/sec for improved community gait.    Time  4    Period  Weeks    Status  New    Target Date  08/26/17      PT LONG TERM GOAL #4   Title  Pt will improve TUG score to less than or equal to 13.5 seconds for decreased fall risk, improved funcitonal mobility.    Time  4    Period  Weeks    Status  New    Target Date  08/26/17      PT LONG TERM GOAL #5   Title  Pt will verbalize at least 3 means to reduce pain in L lower extremity for improved functional mobility.    Time  4    Period  Weeks    Status  New    Target Date  08/26/17         Plan - 08/03/17 1625    Clinical Impression Statement  Today's skilled session focused on pain reduction in left lateral LE/along IT band. Pt presents with very tight musculature and palpabale trigger points all along the lateral aspect of her left leg. Tender to palpation today however tolerated manual therapy. Also issued a few stretches today to HEP to begin to address tightness at home. Pt should benefit from continued PT to progress toward unmet goals.                    Rehab Potential  Good    PT Frequency  2x / week    PT Duration  4 weeks plus eval    PT Treatment/Interventions  ADLs/Self Care Home Management;Cryotherapy;DME Instruction;Moist Heat;Gait training;Functional mobility training;Therapeutic activities;Therapeutic exercise;Balance training;Patient/family education;Neuromuscular re-education;Manual techniques    PT Next Visit Plan  will discuss with primary PT the potential use of ultrasound/estim to address pain/mm tightness; ? dry needling as well; continue with manual techniques to address LE pain and mm tightness.     Consulted and Agree with Plan of Care  Patient       Patient will benefit from skilled therapeutic intervention in order to improve the following deficits and impairments:  Abnormal gait, Decreased balance, Difficulty walking, Decreased  mobility, Decreased strength, Impaired flexibility, Pain  Visit Diagnosis: Chronic pain of right knee  Pain in left lower leg     Problem List Patient Active Problem List   Diagnosis Date Noted  . Morbid obesity ([place]) 07/04/2017  . Vitamin D deficiency 09/02/2014  . 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 Fatty liver disease, nonalcoholic 54/56/2563  . Essential hypertension 08/31/2013  . Medication management 08/31/2013  . T2_NIDDM   . Hyperlipidemia     Willow Ora, PTA, [facility] [address] [place], [place] [address] [phone] 08/04/17, 2:46 PM   Name: Tracy Garrett MRN: [id number] Date of Birth: 1956/03/27

## 2017-08-04 NOTE — Progress Notes (Signed)
Future Appointments  Date Time Provider [place]  08/05/2017  2:45 PM Olegario Shearer [facility]  08/09/2017  8:00 AM Frazier Butt, PT [facility]  08/15/2017  2:45 PM Drema Balzarine, PTA [facility]  08/19/2017  2:45 PM Drema Balzarine, PTA [facility]  08/22/2017  2:00 PM Drema Balzarine, PTA [facility]  08/24/2017  2:45 PM Drema Balzarine, PTA [facility]  08/29/2017  2:00 PM Olegario Shearer [facility]  09/01/2017  2:30 PM Unk Pinto, MD [facility] None  09/02/2017  8:45 AM Frazier Butt, PT [facility]  03/23/2018  3:00 PM Unk Pinto, MD [facility] None

## 2017-08-05 ENCOUNTER — Encounter: Payer: Self-pay | Admitting: Physical Therapy

## 2017-08-05 ENCOUNTER — Ambulatory Visit: Payer: 59 | Attending: Adult Health | Admitting: Physical Therapy

## 2017-08-05 DIAGNOSIS — M25561 Pain in right knee: Secondary | ICD-10-CM | POA: Diagnosis present

## 2017-08-05 DIAGNOSIS — M79662 Pain in left lower leg: Secondary | ICD-10-CM | POA: Diagnosis present

## 2017-08-05 DIAGNOSIS — R262 Difficulty in walking, not elsewhere classified: Secondary | ICD-10-CM

## 2017-08-05 DIAGNOSIS — R29898 Other symptoms and signs involving the musculoskeletal system: Secondary | ICD-10-CM

## 2017-08-05 DIAGNOSIS — G8929 Other chronic pain: Secondary | ICD-10-CM | POA: Diagnosis not present

## 2017-08-05 NOTE — Therapy (Signed)
[facility] [address] [place], [place], [address] Phone: [phone]   Fax:  [phone]  Physical Therapy Treatment  Patient Details  Name: Tracy Garrett MRN: [id number] Date of Birth: September 10, 1955 Referring Provider: Liane Comber, NP   Encounter Date: 08/05/2017  PT End of Session - 08/05/17 2030    Visit Number  3    Number of Visits  9    Date for PT Re-Evaluation  09/24/17    Authorization Type  [employer]    PT Start Time  1446    PT Stop Time  3474    PT Time Calculation (min)  44 min    Activity Tolerance  Patient tolerated treatment well;No increased pain reported decreased in pain/tightness after sessoin    Behavior During Therapy  [facility] for tasks assessed/performed       Past Medical History:  Diagnosis Date  . Allergy   . Cataracts, bilateral   . Diabetes mellitus without complication ([place])   . History of kidney stones    x2 episodes  . Hyperlipidemia   . PONV (postoperative nausea and vomiting)   . Sleep apnea    test was dx, as mild- no further tx. required  . Type II or unspecified type diabetes mellitus without mention of complication, not stated as uncontrolled     Past Surgical History:  Procedure Laterality Date  . BREAST SURGERY  1999   Reduction Mammoplasties  . CHOLECYSTECTOMY  2003  . colonoscopsy  11/2007   neg- polyp removal with both procedures x2  . EUS N/A 07/26/2014   Procedure: UPPER ENDOSCOPIC ULTRASOUND (EUS) LINEAR;  Surgeon: Beryle Beams, MD;  [facility];  Service: Endoscopy;  Laterality: N/A;  . kidney stones     surgery to extract x 1  . LAPAROTOMY N/A 10/10/2014   Procedure: EXPLORATORY LAPAROTOMY EXCISION OF 56mm DUODENAL CARCINOID TUMOR;  Surgeon: [name], MD;  Location: [facility];  Service: General;  Laterality: N/A;  . LYSIS OF ADHESION  10/10/2014   Procedure: LYSIS OF ADHESION;  Surgeon: [name], MD;  Location: [facility];   Service: General;;  . TONSILLECTOMY     and adenoids removed age 24  . VAGINAL HYSTERECTOMY  2002    There were no vitals filed for this visit.  Subjective Assessment - 08/05/17 1456    Subjective  No new complaints. No falls. Sore after last session. Hurting today due to rain.    Patient Stated Goals  Pt's goals for physical therapy are to get rid of pain in leg.    Currently in Pain?  Yes    Pain Score  8     Pain Location  Leg    Pain Orientation  Lower;Lateral;Left    Pain Descriptors / Indicators  Aching :discomfort    Pain Type  Chronic pain    Pain Onset  More than a month ago    Pain Frequency  Intermittent    Aggravating Factors   unknown    Pain Relieving Factors  heat/cryo, Aspercreme helps some           [facility] Adult PT Treatment/Exercise - 08/05/17 1457      Ambulation/Gait   Ambulation/Gait  Yes    Ambulation/Gait Assistance  5: Supervision    Ambulation/Gait Assistance Details  pt arrived with newly purchased cane on her left side. educated pt on correct cane placement and sequencing with gait. less antalgic gait pattern noted with  correct use of  cane.    Ambulation Distance (Feet)  100 Feet x2    Assistive device  Straight cane    Gait Pattern  Step-through pattern;Decreased stance time - left;Decreased step length - right;Antalgic;Lateral trunk lean to left    Ambulation Surface  Level;Indoor      Acupuncturist Location  left greater trochanter/hip area concurrent with moist heat to this area and ultasound to lateral aspect of knee/lower IT band area    Electrical Stimulation Action  for decreased pain and mm tightness    Electrical Stimulation Parameters  concurrent with ultrasound (to another location), intensity to tolerance x 10 minutes    Electrical Stimulation Goals  Pain;Other (comment) mm tightness      Ultrasound   Ultrasound Location  lower IT band/lateral aspect of left knee    Ultrasound Parameters  100%, 1.56  w/cm2, 1.5 w/cm2 x 10 minutes concurrent with estim/MHP to left hip.     Ultrasound Goals  Pain;Other (Comment) decr mm tightness      Manual Therapy   Manual Therapy  Soft tissue mobilization;Other (comment);Myofascial release    Manual therapy comments  all manual therapy performed with goal of decreased pain and decreased mm tightness. palpable trigger points noted. decreased mm tightness noted and reported after manual therapy.     Soft tissue mobilization  to left IT band/lateral aspect of LE    Myofascial Release  to left IT band and lateral aspect of left LE    Other Manual Therapy  use of foam roll to IT band intiating with light pressure and gradually increasing pressure as band relaxed and pt tolerated.            PT Long Term Goals - 07/26/17 1142      PT LONG TERM GOAL #1   Title  Pt will be independent with HEP for pain reduction, improved functional mobility, flexibility and strength.  TARGET 08/26/17    Time  4    Period  Weeks    Status  New    Target Date  08/26/17      PT LONG TERM GOAL #2   Title  Pt will demonstrate improved knee flexion and extension by 5 degrees for improved LLE flexibility.    Time  4    Period  Weeks    Status  New    Target Date  08/26/17      PT LONG TERM GOAL #3   Title  Pt will improve gait velocity to at least 2.62 ft/sec for improved community gait.    Time  4    Period  Weeks    Status  New    Target Date  08/26/17      PT LONG TERM GOAL #4   Title  Pt will improve TUG score to less than or equal to 13.5 seconds for decreased fall risk, improved funcitonal mobility.    Time  4    Period  Weeks    Status  New    Target Date  08/26/17      PT LONG TERM GOAL #5   Title  Pt will verbalize at least 3 means to reduce pain in L lower extremity for improved functional mobility.    Time  4    Period  Weeks    Status  New    Target Date  08/26/17         Plan - 08/05/17 2032    Clinical Impression Statement  Today's  skilled session focused on pain reduction and decreased mm tightness in left LE. Pt did report feeling less tightness/pain after session today. Pt is progressing toward goals and should benefit from continued PT to progress toward unmet goals.     Rehab Potential  Good    PT Frequency  2x / week    PT Duration  4 weeks plus eval    PT Treatment/Interventions  ADLs/Self Care Home Management;Cryotherapy;DME Instruction;Moist Heat;Gait training;Functional mobility training;Therapeutic activities;Therapeutic exercise;Balance training;Patient/family education;Neuromuscular re-education;Manual techniques    PT Next Visit Plan  continue to address mm tightness/pain with modalities/manual therapy as needed. continue to work on gentle stretching and strengthening,continue with gait training with cane     Consulted and Agree with Plan of Care  Patient       Patient will benefit from skilled therapeutic intervention in order to improve the following deficits and impairments:  Abnormal gait, Decreased balance, Difficulty walking, Decreased mobility, Decreased strength, Impaired flexibility, Pain  Visit Diagnosis: Other symptoms and signs involving the musculoskeletal system  Chronic pain of right knee  Pain in left lower leg  Difficulty in walking, not elsewhere classified     Problem List Patient Active Problem List   Diagnosis Date Noted  . Morbid obesity ([place]) 07/04/2017  . Vitamin D deficiency 09/02/2014  . 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 Fatty liver disease, nonalcoholic 58/02/9832  . Essential hypertension 08/31/2013  . Medication management 08/31/2013  . T2_NIDDM   . Hyperlipidemia     Willow Ora, PTA, [facility] [address] [place], [place] [address] [phone] 08/05/17, 8:48 PM   Name: Tracy Garrett MRN: [id number] Date of Birth: 20-Oct-1955

## 2017-08-05 NOTE — Addendum Note (Signed)
Addended by: Frazier Butt on: 08/05/2017 02:26 PM   Modules accepted: Orders

## 2017-08-09 ENCOUNTER — Encounter: Payer: Self-pay | Admitting: Physical Therapy

## 2017-08-09 ENCOUNTER — Ambulatory Visit: Payer: 59 | Admitting: Physical Therapy

## 2017-08-09 DIAGNOSIS — M79662 Pain in left lower leg: Secondary | ICD-10-CM

## 2017-08-09 DIAGNOSIS — R262 Difficulty in walking, not elsewhere classified: Secondary | ICD-10-CM

## 2017-08-09 DIAGNOSIS — R29898 Other symptoms and signs involving the musculoskeletal system: Secondary | ICD-10-CM | POA: Diagnosis not present

## 2017-08-10 NOTE — Therapy (Signed)
[facility] [address] [place], [place], [address] Phone: [phone]   Fax:  [phone]  Physical Therapy Treatment  Patient Details  Name: Tracy Garrett MRN: [id number] Date of Birth: [birth year] Referring Provider: Liane Comber, NP   Encounter Date: 08/09/2017  PT End of Session - 08/09/17 2008    Visit Number  4    Number of Visits  9    Date for PT Re-Evaluation  09/24/17    Authorization Type  [employer]    PT Start Time  [phone] pt arrives late    PT Stop Time  0850    PT Time Calculation (min)  42 min    Activity Tolerance  Patient tolerated treatment well;No increased pain reported decreased in pain/tightness after session, leg feels "lighter"    Behavior During Therapy  WFL for tasks assessed/performed       Past Medical History:  Diagnosis Date  . Allergy   . Cataracts, bilateral   . Diabetes mellitus without complication ([place])   . History of kidney stones    x2 episodes  . Hyperlipidemia   . PONV (postoperative nausea and vomiting)   . Sleep apnea    test was dx, as mild- no further tx. required  . Type II or unspecified type diabetes mellitus without mention of complication, not stated as uncontrolled     Past Surgical History:  Procedure Laterality Date  . BREAST SURGERY  1999   Reduction Mammoplasties  . CHOLECYSTECTOMY  2003  . colonoscopsy  11/2007   neg- polyp removal with both procedures x2  . EUS N/A 07/26/2014   Procedure: UPPER ENDOSCOPIC ULTRASOUND (EUS) LINEAR;  Surgeon: Beryle Beams, MD;  Location: [facility];  Service: Endoscopy;  Laterality: N/A;  . kidney stones     surgery to extract x 1  . LAPAROTOMY N/A 10/10/2014   Procedure: EXPLORATORY LAPAROTOMY EXCISION OF 49mm DUODENAL CARCINOID TUMOR;  Surgeon: [name], MD;  Location: [facility];  Service: General;  Laterality: N/A;  . LYSIS OF ADHESION  10/10/2014   Procedure: LYSIS OF ADHESION;  Surgeon: [name], MD;  Location: [facility];  Service: General;;  . TONSILLECTOMY     and adenoids removed age 72  . VAGINAL HYSTERECTOMY  2002    There were no vitals filed for this visit.  Subjective Assessment - 08/09/17 0810    Subjective  Just moving slowly today.  Still having pain in hip and knee and lower leg-same areas but not all at the same time.    Patient Stated Goals  Pt's goals for physical therapy are to get rid of pain in leg.    Currently in Pain?  Yes    Pain Score  9     Pain Location  Leg    Pain Orientation  Left;Lower;Lateral    Pain Descriptors / Indicators  Aching    Pain Type  Chronic pain    Pain Onset  More than a month ago    Aggravating Factors   unsure    Pain Relieving Factors  electrical stimulation/ultrasound seemed to help                      [facility] Adult PT Treatment/Exercise - 08/09/17 1958      Exercises   Exercises  Knee/Hip      Knee/Hip Exercises: Stretches   Active Hamstring Stretch  Left;3 reps;30 seconds Cues for positioning for optimal stretch  Piriformis Stretch  Left;1 rep;10 seconds Seated    Piriformis Stretch Limitations  pain and difficult to achieve stretch position in sitting; modified version of piriformis stretch given in supine, performed 3 reps x 30 seconds on LLE      Knee/Hip Exercises: Supine   Bridges  Strengthening;Both;1 set;10 reps    Other Supine Knee/Hip Exercises  Supine resisted clamshell with red theraband in clinic, x 10 reps      Knee/Hip Exercises: Sidelying   Clams  Sidelying on R, with LLE clamshell, x 10 reps, with cues for positioning and technique.      Acupuncturist Location  left greater trochanter/hip area and ultasound to lateral aspect of knee/lower IT band area    Electrical Stimulation Action  for decreased pain, muscle tightness    Electrical Stimulation Parameters  concurrent with ultrasound (to another location), intensity to tolerance (100 pps),  high volt continuous x 8 minutes    Electrical Stimulation Goals  Pain;Other (comment) muscle tightness      Ultrasound   Ultrasound Location  lower IT band/lateral aspect of L knee    Ultrasound Parameters  100%, 1.56 w/cm2, 1.5 cm2 x 8 minutes concurent with electrical stimulation to L hip    Ultrasound Goals  Pain;Other (Comment) decrease muscle tightness along lower aspect IT band      Manual Therapy   Manual Therapy  Soft tissue mobilization    Manual therapy comments  Manual therapy performed with goal of decreased pain and decreased IT band tightness; palpable trigger points noted (more along superior-mid aspect of IT band)    Soft tissue mobilization  to left IT band/lateral aspect of LLE                  PT Long Term Goals - 07/26/17 1142      PT LONG TERM GOAL #1   Title  Pt will be independent with HEP for pain reduction, improved functional mobility, flexibility and strength.  TARGET 08/26/17    Time  4    Period  Weeks    Status  New    Target Date  08/26/17      PT LONG TERM GOAL #2   Title  Pt will demonstrate improved knee flexion and extension by 5 degrees for improved LLE flexibility.    Time  4    Period  Weeks    Status  New    Target Date  08/26/17      PT LONG TERM GOAL #3   Title  Pt will improve gait velocity to at least 2.62 ft/sec for improved community gait.    Time  4    Period  Weeks    Status  New    Target Date  08/26/17      PT LONG TERM GOAL #4   Title  Pt will improve TUG score to less than or equal to 13.5 seconds for decreased fall risk, improved funcitonal mobility.    Time  4    Period  Weeks    Status  New    Target Date  08/26/17      PT LONG TERM GOAL #5   Title  Pt will verbalize at least 3 means to reduce pain in L lower extremity for improved functional mobility.    Time  4    Period  Weeks    Status  New    Target Date  08/26/17  Plan - 08/09/17 2010    Clinical Impression Statement  Continued  skilled PT this visit to address pain reduction, decreased muscle tightness in LLE, as well as review/modifications to HEP given several visits ago.  Pt has significant antalgic gait coming into and going out of PT session, but does report that "leg feels lighter" after session today.  In general, pt reports L knee is more painful than other parts of leg today.  Pt will conitnue to benefit from continued PT to further addresss pain, muscle tightness, weakness, and gait training with cane as able.    Rehab Potential  Good    PT Frequency  2x / week    PT Duration  4 weeks plus eval    PT Treatment/Interventions  ADLs/Self Care Home Management;Cryotherapy;DME Instruction;Moist Heat;Gait training;Functional mobility training;Therapeutic activities;Therapeutic exercise;Balance training;Patient/family education;Neuromuscular re-education;Manual techniques;Electrical Stimulation;Ultrasound;Dry needling    PT Next Visit Plan  continue to address mm tightness/pain with modalities/manual therapy as needed (May try e-stim more around knee area and ultrasound on more mid-superior aspect of IT band); give IT band stretches/foam roller instruction for home; continue to work on gentle stretching and strengthening,continue with gait training with cane     Consulted and Agree with Plan of Care  Patient       Patient will benefit from skilled therapeutic intervention in order to improve the following deficits and impairments:  Abnormal gait, Decreased balance, Difficulty walking, Decreased mobility, Decreased strength, Impaired flexibility, Pain  Visit Diagnosis: Pain in left lower leg  Other symptoms and signs involving the musculoskeletal system  Difficulty in walking, not elsewhere classified     Problem List Patient Active Problem List   Diagnosis Date Noted  . Morbid obesity ([place]) 07/04/2017  . Vitamin D deficiency 09/02/2014  . 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 Fatty liver disease,  nonalcoholic 08/67/6195  . Essential hypertension 08/31/2013  . Medication management 08/31/2013  . T2_NIDDM   . Hyperlipidemia     Amamda Curbow W. 08/10/2017, 8:41 AM  Frazier Butt., PT  [facility] [address] [place], [place], [address] Phone: [phone]   Fax:  [phone]  Name: Tracy Garrett MRN: [id number] Date of Birth: 15-Feb-1956

## 2017-08-12 ENCOUNTER — Ambulatory Visit: Payer: 59 | Admitting: Physical Therapy

## 2017-08-15 ENCOUNTER — Encounter: Payer: Self-pay | Admitting: Physical Therapy

## 2017-08-15 ENCOUNTER — Ambulatory Visit: Payer: 59 | Admitting: Physical Therapy

## 2017-08-15 DIAGNOSIS — R29898 Other symptoms and signs involving the musculoskeletal system: Secondary | ICD-10-CM | POA: Diagnosis not present

## 2017-08-15 DIAGNOSIS — R262 Difficulty in walking, not elsewhere classified: Secondary | ICD-10-CM

## 2017-08-15 DIAGNOSIS — M79662 Pain in left lower leg: Secondary | ICD-10-CM

## 2017-08-15 NOTE — Therapy (Signed)
[facility] [address] [place], [place], [address] Phone: [phone]   Fax:  [phone]  Physical Therapy Treatment  Patient Details  Name: Tracy Garrett MRN: [id number] Date of Birth: [birth year] Referring Provider: Liane Comber, NP   Encounter Date: 08/15/2017  PT End of Session - 08/15/17 1615    Visit Number  5    Number of Visits  9    Date for PT Re-Evaluation  09/24/17    Authorization Type  [employer]    PT Start Time  5361    PT Stop Time  1540    PT Time Calculation (min)  55 min    Activity Tolerance  Patient tolerated treatment well;No increased pain    Behavior During Therapy  WFL for tasks assessed/performed       Past Medical History:  Diagnosis Date  . Allergy   . Cataracts, bilateral   . Diabetes mellitus without complication ([place])   . History of kidney stones    x2 episodes  . Hyperlipidemia   . PONV (postoperative nausea and vomiting)   . Sleep apnea    test was dx, as mild- no further tx. required  . Type II or unspecified type diabetes mellitus without mention of complication, not stated as uncontrolled     Past Surgical History:  Procedure Laterality Date  . BREAST SURGERY  1999   Reduction Mammoplasties  . CHOLECYSTECTOMY  2003  . colonoscopsy  11/2007   neg- polyp removal with both procedures x2  . EUS N/A 07/26/2014   Procedure: UPPER ENDOSCOPIC ULTRASOUND (EUS) LINEAR;  Surgeon: Beryle Beams, MD;  Location: [facility];  Service: Endoscopy;  Laterality: N/A;  . kidney stones     surgery to extract x 1  . LAPAROTOMY N/A 10/10/2014   Procedure: EXPLORATORY LAPAROTOMY EXCISION OF 74mm DUODENAL CARCINOID TUMOR;  Surgeon: [name], MD;  Location: [facility];  Service: General;  Laterality: N/A;  . LYSIS OF ADHESION  10/10/2014   Procedure: LYSIS OF ADHESION;  Surgeon: [name], MD;  Location: [facility];  Service: General;;  . TONSILLECTOMY     and adenoids  removed age 62  . VAGINAL HYSTERECTOMY  2002    There were no vitals filed for this visit.  Subjective Assessment - 08/15/17 1546    Subjective  Pt reported no fall and no changes in medications. Pt did have left leg pain and tightness, but stated it felt better than since the last session. Additionally, pt stated she had been using the Biofree[employer] roll on her left leg, but stated the menthol cream from the [employer] works better for her.     Patient Stated Goals  Pt's goals for physical therapy are to get rid of pain in leg.    Pain Score  7     Pain Location  Leg    Pain Orientation  Left;Lower    Pain Descriptors / Indicators  Tightness    Pain Type  Chronic pain    Pain Onset  More than a month ago    Pain Frequency  Intermittent    Aggravating Factors   unsure    Pain Relieving Factors  electrical stimulation/ ultrasound seemed to help, hot pack, pain cream/roll     Multiple Pain Sites  Yes    Pain Score  7    Pain Location  Knee    Pain Orientation  Left    Pain Type  Chronic pain  Pain Radiating Towards  Does not report radiating, just pain in different spots at different times    Pain Onset  More than a month ago    Pain Frequency  Intermittent    Aggravating Factors   walking more    Pain Relieving Factors  aspercreme           [facility] Adult PT Treatment/Exercise - 08/15/17 1552      Ambulation/Gait   Ambulation/Gait  Yes    Ambulation/Gait Assistance  5: Supervision    Ambulation/Gait Assistance Details  Pt using cane on left side. Pt still has antalgic gait but it did improve after PT treatment.     Ambulation Distance (Feet)  50 Feet 50 feet x 2, entering/exiting clinic.     Assistive device  Straight cane    Gait Pattern  Step-through pattern;Decreased stance time - left;Decreased step length - right;Antalgic;Lateral trunk lean to left    Ambulation Surface  Level;Indoor      Acupuncturist Location  Estim to left knee area  using 4 electrodes (x-pattern with 2 in medial and 2 in lateral portions of knee) and ultrasound to mid-superior aspect of lower IT band area    Electrical Stimulation Action  For decreased knee pain, decreased muscle tightness, and to reduce ITB restrictions and trigger points.     Electrical Stimulation Parameters  Concurrent with ultrasound, intensity to tolerance x 12 minutes     Electrical Stimulation Goals  Pain;Other (comment) mm tightness      Ultrasound   Ultrasound Location  mid-superior aspect of lower ITB area    Ultrasound Parameters  100%, 1.5 w/cm2, 1.5 w/cm2 x 12 minutes concurrent with estim    Ultrasound Goals  Pain;Other (Comment) decrease mm tightness      Manual Therapy   Manual Therapy  Soft tissue mobilization;Other (comment) ; deep tissue mobilization    Manual therapy comments  Manual therapy performed with goal of decreased pain and decreased left IT band, left gastrocnemius, and peroneal tightness; palpable trigger points noted (more along superior-mid aspect of IT band and gastroc); both areas we're cleared after manual therapy.     Soft tissue mobilization  to left IT band/lateral aspect of LLE/ left gastroc and peroneal areas    Myofascial Release  to left IT band, lateral aspect of left LE, and gastroc    Other Manual Therapy  use of small white foam roll to IT band intiating with light pressure and gradually increasing pressure as band relaxed and pt tolerated.          PT Education - 08/15/17 1612    Education provided  Yes    Education Details  Computer work, stretch break, posture sitting- Work Sports administrator) Educated  Patient    Methods  Explanation;Handout    Comprehension  Verbalized Metallurgist Work    Position work to Programmer, multimedia. Use proper work and seat height. Keep shoulders back and down, wrists straight, and elbows at right angles. Use chair that provides full back support. Add footrest and lumbar roll  as needed.   Copyright  VHI. All rights reserved.  Posture - Sitting    Sit upright, head facing forward. Try using a roll to support lower back. Keep shoulders relaxed, and avoid rounded back. Keep hips level with knees. Avoid crossing legs for long periods.   Copyright  VHI. All rights reserved.  Stretch Break -  Lower Back Stretch    Place hands or thumbs on back of hips and lean backward while lifting chest. Hold ____ seconds. Relax and return to starting position. Repeat _5___ times every __1-2__ hours.  Copyright  VHI. All rights reserved.     PT Long Term Goals - 07/26/17 1142      PT LONG TERM GOAL #1   Title  Pt will be independent with HEP for pain reduction, improved functional mobility, flexibility and strength.  TARGET 08/26/17    Time  4    Period  Weeks    Status  New    Target Date  08/26/17      PT LONG TERM GOAL #2   Title  Pt will demonstrate improved knee flexion and extension by 5 degrees for improved LLE flexibility.    Time  4    Period  Weeks    Status  New    Target Date  08/26/17      PT LONG TERM GOAL #3   Title  Pt will improve gait velocity to at least 2.62 ft/sec for improved community gait.    Time  4    Period  Weeks    Status  New    Target Date  08/26/17      PT LONG TERM GOAL #4   Title  Pt will improve TUG score to less than or equal to 13.5 seconds for decreased fall risk, improved funcitonal mobility.    Time  4    Period  Weeks    Status  New    Target Date  08/26/17      PT LONG TERM GOAL #5   Title  Pt will verbalize at least 3 means to reduce pain in L lower extremity for improved functional mobility.    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 After manual therapy, pt stated that sitting, standing, and walking "felt much better and less tight" after today's session. Overall, pt presented with less overall pain in comparison to previous session. Pt will continue to benefit from continued PT to further address pain, muscle tightness, and reach STG's.      History and Personal Factors relevant to plan of care:  >3 systems involvement, 1 fall in past 6 months ; PMH >3 co-morbidities    Clinical Presentation  Evolving    Clinical Presentation due to:  fall risk per TUG score; pain limiting mobility (no formal diagnosis from MD)     Clinical Decision Making  Moderate    Rehab Potential  Good    PT Frequency  2x / week    PT Duration  4 weeks    PT Treatment/Interventions  ADLs/Self Care Home Management;Cryotherapy;DME Instruction;Moist Heat;Gait training;Functional mobility training;Therapeutic activities;Therapeutic exercise;Balance training;Patient/family education;Neuromuscular re-education;Manual techniques;Electrical Stimulation;Ultrasound;Dry needling    PT Next Visit Plan  Continue to address mm tightness/pain with modalities/manual therapy as needed. Palpate IT band to see if noted restrictions addressed with manual therapy in L ITB, gastroc, peroneals stayed cleared. 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 Work towards [employer] due 08/26/17.    Consulted and Agree with Plan of Care  Patient       Patient will benefit from skilled therapeutic intervention in order to improve the following deficits and impairments:  Abnormal gait, Decreased balance, Difficulty walking, Decreased mobility, Decreased strength, Impaired flexibility, Pain  Visit Diagnosis: Pain in left lower leg  Other symptoms and signs involving the musculoskeletal system  Difficulty in  walking, not elsewhere classified     Problem List Patient Active Problem List   Diagnosis Date Noted  . Morbid obesity ([place]) 07/04/2017  . Vitamin D deficiency 09/02/2014  . 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 Fatty liver disease, nonalcoholic 19/75/8832  . Essential hypertension 08/31/2013  . Medication management 08/31/2013  . T2_NIDDM   . Hyperlipidemia     Carlena Sax 08/15/2017, 4:31 PM  [facility] [address] [place], [place], [address] Phone: [phone]   Fax:  [phone]  Name: Tracy Garrett MRN: [id number] Date of Birth: 09-01-1955

## 2017-08-15 NOTE — Patient Instructions (Addendum)
Computer Work    Position work to Programmer, multimedia. Use proper work and seat height. Keep shoulders back and down, wrists straight, and elbows at right angles. Use chair that provides full back support. Add footrest and lumbar roll as needed.   Copyright  VHI. All rights reserved.  Posture - Sitting    Sit upright, head facing forward. Try using a roll to support lower back. Keep shoulders relaxed, and avoid rounded back. Keep hips level with knees. Avoid crossing legs for long periods.   Copyright  VHI. All rights reserved.  Stretch Break - Lower Back Stretch    Place hands or thumbs on back of hips and lean backward while lifting chest. Hold ____ seconds. Relax and return to starting position. Repeat _5___ times every __1-2__ hours.  Copyright  VHI. All rights reserved.

## 2017-08-19 ENCOUNTER — Encounter: Payer: Self-pay | Admitting: Physical Therapy

## 2017-08-19 ENCOUNTER — Ambulatory Visit: Payer: 59 | Admitting: Physical Therapy

## 2017-08-19 DIAGNOSIS — R29898 Other symptoms and signs involving the musculoskeletal system: Secondary | ICD-10-CM

## 2017-08-19 DIAGNOSIS — R262 Difficulty in walking, not elsewhere classified: Secondary | ICD-10-CM

## 2017-08-19 DIAGNOSIS — M79662 Pain in left lower leg: Secondary | ICD-10-CM

## 2017-08-19 NOTE — Therapy (Signed)
[facility] [address] [place], [place], [address] Phone: [phone]   Fax:  [phone]  Physical Therapy Treatment  Patient Details  Name: Tracy Garrett MRN: [id number] Date of Birth: [birth year] Referring Provider: Liane Comber, NP   Encounter Date: 08/19/2017  PT End of Session - 08/19/17 1651    Visit Number  6    Number of Visits  9    Date for PT Re-Evaluation  09/24/17    Authorization Type  [employer]    PT Start Time  1447    PT Stop Time  1529    PT Time Calculation (min)  42 min    Equipment Utilized During Treatment  Gait belt    Activity Tolerance  Patient tolerated treatment well;No increased pain    Behavior During Therapy  WFL for tasks assessed/performed       Past Medical History:  Diagnosis Date  . Allergy   . Cataracts, bilateral   . Diabetes mellitus without complication ([place])   . History of kidney stones    x2 episodes  . Hyperlipidemia   . PONV (postoperative nausea and vomiting)   . Sleep apnea    test was dx, as mild- no further tx. required  . Type II or unspecified type diabetes mellitus without mention of complication, not stated as uncontrolled     Past Surgical History:  Procedure Laterality Date  . BREAST SURGERY  1999   Reduction Mammoplasties  . CHOLECYSTECTOMY  2003  . colonoscopsy  11/2007   neg- polyp removal with both procedures x2  . EUS N/A 07/26/2014   Procedure: UPPER ENDOSCOPIC ULTRASOUND (EUS) LINEAR;  Surgeon: Beryle Beams, MD;  Location: [facility];  Service: Endoscopy;  Laterality: N/A;  . kidney stones     surgery to extract x 1  . LAPAROTOMY N/A 10/10/2014   Procedure: EXPLORATORY LAPAROTOMY EXCISION OF 50mm DUODENAL CARCINOID TUMOR;  Surgeon: [name], MD;  Location: [facility];  Service: General;  Laterality: N/A;  . LYSIS OF ADHESION  10/10/2014   Procedure: LYSIS OF ADHESION;  Surgeon: [name], MD;  Location: [facility];   Service: General;;  . TONSILLECTOMY     and adenoids removed age 62  . VAGINAL HYSTERECTOMY  2002    There were no vitals filed for this visit.  Subjective Assessment - 08/19/17 1451    Subjective  Pt reported no changes in meds, no falls, and no pain today. Pt stated pain levels have decreased since the last session.(which was focused on pain reduction via manual therapy and modalities)     Patient Stated Goals  Pt's goals for physical therapy are to get rid of pain in leg.    Currently in Pain?  No/denies    Pain Score  0-No pain           [facility] Adult PT Treatment/Exercise - 08/19/17 1638      Bed Mobility   Bed Mobility  Sit to Supine    Sit to Supine  5: Supervision    Sit to Supine - Details (indicate cue type and reason)  Pt transferred from sitting to supine with supervision assistance only for position of new stretch; pt also when from supine to prone position to learn addition to HEP exercise Prone TKE (total knee extension)       Transfers   Transfers  Sit to Stand;Stand to Sit    Sit to Stand  6: Modified independent (Device/Increase time);With upper extremity  assist;From chair/3-in-1    Stand to Sit  6: Modified independent (Device/Increase time);With upper extremity assist;To chair/3-in-1    Comments  x 5 sit to stands during rest breaks during today's session.      Ambulation/Gait   Ambulation/Gait  Yes    Ambulation/Gait Assistance  5: Supervision    Ambulation/Gait Assistance Details  Pt used cane during gait today. Pt reported she is walking pain free today, which was exhibited by having a non-antalgic gait today.     Ambulation Distance (Feet)  345 Feet 345 x 2 reps, plus 50 feet x 2 reps around clinic.     Assistive device  Straight cane    Gait Pattern  Step-through pattern;Decreased stance time - left;Decreased step length - right;Lateral trunk lean to left    Ambulation Surface  Level;Indoor      Knee/Hip Exercises: Stretches   ITB Stretch  Both;3  reps;30 seconds;Other (comment) Using blue strap/long gait belt      Knee/Hip Exercises: Aerobic   Nustep  x 8 mins at level 4, with using LE's only.       Knee/Hip Exercises: Machines for Strengthening   Total Gym Leg Press  Performed #50 lbs x 10 reps, #55 lbs x10 reps; with initial verbal and demonstration cues for performance of task. Verbal cues for proper knee alignment provided.       Knee/Hip Exercises: Standing   Other Standing Knee Exercises  Side stepping laterally with green theraband tied above knees for resistance x 4 reps alongside countertop, no UE hold required.       Knee/Hip Exercises: Prone   Other Prone Exercises  Prone Total Knee Extension x 10 reps with 5 sec holds bilaterally. Provided initial verbal, demonstration, and tactile cue for performance of task.          PT Education - 08/19/17 1651    Education provided  Yes    Education Details  addition of Prone TKE and ITB stretch with a strap (pt states she has a stretch strap at home)     Person(s) Educated  Patient    Methods  Explanation    Comprehension  Verbalized understanding;Returned demonstration          PT Long Term Goals - 07/26/17 1142      PT LONG TERM GOAL #1   Title  Pt will be independent with HEP for pain reduction, improved functional mobility, flexibility and strength.  TARGET 08/26/17    Time  4    Period  Weeks    Status  New    Target Date  08/26/17      PT LONG TERM GOAL #2   Title  Pt will demonstrate improved knee flexion and extension by 5 degrees for improved LLE flexibility.    Time  4    Period  Weeks    Status  New    Target Date  08/26/17      PT LONG TERM GOAL #3   Title  Pt will improve gait velocity to at least 2.62 ft/sec for improved community gait.    Time  4    Period  Weeks    Status  New    Target Date  08/26/17      PT LONG TERM GOAL #4   Title  Pt will improve TUG score to less than or equal to 13.5 seconds for decreased fall risk, improved  funcitonal mobility.    Time  4    Period  Weeks  Status  New    Target Date  08/26/17      PT LONG TERM GOAL #5   Title  Pt will verbalize at least 3 means to reduce pain in L lower extremity for improved functional mobility.    Time  4    Period  Weeks    Status  New    Target Date  08/26/17         Plan - 08/19/17 1652    Clinical Impression Statement  Pt arrived to PT clinic pain free with BLE's demonstrating carryover from last PT tx which focused on pain education. Today's skilled PT session focused bilateral lower extremity strengthening but adding seated leg press, resisted Nustep, and closed chain LE exercises. Pt was also educated on a supine ITB stretch for BLE stretching and a prone TKE exercises for addressing few degrees grossly of knee extension lag.  Pt continues to make progress towards LTG's and would benefit towards skilled PT interventions to meet unmet PT goals.     History and Personal Factors relevant to plan of care:  >3 systems involvement, 1 fall in last past 6 month ; PMH >3 co-morbidities    Clinical Presentation  Evolving    Clinical Presentation due to:  fall risk per TUG score; pain limiting mobility ( no formal diagnosis from MD )    Clinical Decision Making  Moderate    PT Frequency  2x / week    PT Duration  4 weeks    PT Treatment/Interventions  ADLs/Self Care Home Management;Cryotherapy;DME Instruction;Moist Heat;Gait training;Functional mobility training;Therapeutic activities;Therapeutic exercise;Balance training;Patient/family education;Neuromuscular re-education;Manual techniques;Electrical Stimulation;Ultrasound;Dry needling    PT Next Visit Plan  Check LTG's due 08/26/17. Follow up to check on DOMS after today session and if pt still pain free. Modalities and manual therapy usage PRN.     Consulted and Agree with Plan of Care  Patient       Patient will benefit from skilled therapeutic intervention in order to improve the following deficits  and impairments:  Abnormal gait, Decreased balance, Difficulty walking, Decreased mobility, Decreased strength, Impaired flexibility, Pain  Visit Diagnosis: Pain in left lower leg  Other symptoms and signs involving the musculoskeletal system  Difficulty in walking, not elsewhere classified     Problem List Patient Active Problem List   Diagnosis Date Noted  . Morbid obesity ([place]) 07/04/2017  . Vitamin D deficiency 09/02/2014  . 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 Fatty liver disease, nonalcoholic 57/06/7791  . Essential hypertension 08/31/2013  . Medication management 08/31/2013  . T2_NIDDM   . Hyperlipidemia     Carlena Sax, SPTA 08/19/2017, 5:00 PM  [facility] [address] [place], [place], [address] Phone: [phone]   Fax:  [phone]  Name: ZARETH RIPPETOE MRN: [id number] Date of Birth: [birth year]

## 2017-08-22 ENCOUNTER — Ambulatory Visit: Payer: 59 | Admitting: Physical Therapy

## 2017-08-24 ENCOUNTER — Encounter: Payer: Self-pay | Admitting: Physical Therapy

## 2017-08-24 ENCOUNTER — Ambulatory Visit: Payer: 59 | Admitting: Physical Therapy

## 2017-08-24 DIAGNOSIS — R29898 Other symptoms and signs involving the musculoskeletal system: Secondary | ICD-10-CM | POA: Diagnosis not present

## 2017-08-24 DIAGNOSIS — M79662 Pain in left lower leg: Secondary | ICD-10-CM

## 2017-08-24 DIAGNOSIS — R262 Difficulty in walking, not elsewhere classified: Secondary | ICD-10-CM

## 2017-08-24 NOTE — Therapy (Signed)
[facility] [address] [place], [place], [address] Phone: [phone]   Fax:  [phone]  Physical Therapy Treatment  Patient Details  Name: Tracy Garrett MRN: [id number] Date of Birth: Jan 18, 1956 Referring Provider: Liane Comber, NP   Encounter Date: 08/24/2017  PT End of Session - 08/24/17 1742    Visit Number  7    Number of Visits  9    Date for PT Re-Evaluation  09/24/17    Authorization Type  [employer]    PT Start Time  1447    PT Stop Time  1530    PT Time Calculation (min)  43 min    Equipment Utilized During Treatment  Gait belt    Activity Tolerance  Patient tolerated treatment well;No increased pain    Behavior During Therapy  WFL for tasks assessed/performed       Past Medical History:  Diagnosis Date  . Allergy   . Cataracts, bilateral   . Diabetes mellitus without complication ([place])   . History of kidney stones    x2 episodes  . Hyperlipidemia   . PONV (postoperative nausea and vomiting)   . Sleep apnea    test was dx, as mild- no further tx. required  . Type II or unspecified type diabetes mellitus without mention of complication, not stated as uncontrolled     Past Surgical History:  Procedure Laterality Date  . BREAST SURGERY  1999   Reduction Mammoplasties  . CHOLECYSTECTOMY  2003  . colonoscopsy  11/2007   neg- polyp removal with both procedures x2  . EUS N/A 07/26/2014   Procedure: UPPER ENDOSCOPIC ULTRASOUND (EUS) LINEAR;  Surgeon: Beryle Beams, MD;  Location: [facility];  Service: Endoscopy;  Laterality: N/A;  . kidney stones     surgery to extract x 1  . LAPAROTOMY N/A 10/10/2014   Procedure: EXPLORATORY LAPAROTOMY EXCISION OF 21mm DUODENAL CARCINOID TUMOR;  Surgeon: [name], MD;  Location: [facility];  Service: General;  Laterality: N/A;  . LYSIS OF ADHESION  10/10/2014   Procedure: LYSIS OF ADHESION;  Surgeon: [name], MD;  Location: [facility];   Service: General;;  . TONSILLECTOMY     and adenoids removed age 26  . VAGINAL HYSTERECTOMY  2002    There were no vitals filed for this visit.  Subjective Assessment - 08/24/17 1451    Subjective  Pt reported no changes in medications and no falls. However, pt stated she had "left leg muscle tightness and felt like a pull." "I did plenty of walking on Sunday at church without my cane which I think caused it."      Patient Stated Goals  Pt's goals for physical therapy are to get rid of pain in leg.    Currently in Pain?  Yes    Pain Score  4     Pain Orientation  Left;Lower    Pain Type  Chronic pain    Pain Onset  More than a month ago    Pain Frequency  Intermittent           [facility] Adult PT Treatment/Exercise - 08/24/17 1526      Bed Mobility   Bed Mobility  Sit to Supine    Sit to Supine  5: Supervision    Sit to Supine - Details (indicate cue type and reason)  For performing supine stretches with only supervision assistance.       Transfers   Transfers  Sit to Stand;Stand  to Sit    Sit to Stand  6: Modified independent (Device/Increase time);With upper extremity assist;From chair/3-in-1    Stand to Sit  6: Modified independent (Device/Increase time);With upper extremity assist;To chair/3-in-1    Comments  x 5 sit to stands during rest breaks during today's session.      Ambulation/Gait   Ambulation/Gait  Yes    Ambulation/Gait Assistance  5: Supervision    Ambulation/Gait Assistance Details  Pt used cane during gait today. Pt with minimal antalgic gait today due to left LE pain.     Ambulation Distance (Feet)  345 Feet plus 50 feet x 2.    Assistive device  Straight cane    Gait Pattern  Step-through pattern;Decreased stance time - left;Decreased step length - right;Antalgic;Lateral trunk lean to left    Ambulation Surface  Level;Indoor      Exercises   Exercises  Knee/Hip      Knee/Hip Exercises: Stretches   Sports administrator  Both;3 reps;30 seconds    ITB Stretch   Both;3 reps;30 seconds;Other (comment)    Gastroc Stretch  Both;Left;Right;30 seconds;3 reps      Knee/Hip Exercises: Aerobic   Nustep  x 8 min and 30 secs at level 4, with using LE's only.       Knee/Hip Exercises: Machines for Strengthening   Total Gym Leg Press  Performed #55 lbs x 10 reps, #60 lbs x10 reps; with initial verbal cues for performance of task. Verbal cues for proper knee alignment provided.       Knee/Hip Exercises: Standing   Theraband Level (Terminal Knee Extension)  Level 1 (Yellow);Other (comment) With B UE support for posture    Terminal Knee Extension Limitations  x 10 reps bilateral with 5 sec holds.     Lateral Step Up  10 reps;Both;1 set;Step Height: 6"    Lateral Step Up Limitations  Pt required initial demo and verbal cues. Used single UE support on rail of stairs.     Other Standing Knee Exercises  SLS stance with contralateral SLR with resisted yellow tband x 20 reps bilateral for strengthening and proprioception training.             PT Long Term Goals - 07/26/17 1142      PT LONG TERM GOAL #1   Title  Pt will be independent with HEP for pain reduction, improved functional mobility, flexibility and strength.  TARGET 08/26/17    Time  4    Period  Weeks    Status  New    Target Date  08/26/17      PT LONG TERM GOAL #2   Title  Pt will demonstrate improved knee flexion and extension by 5 degrees for improved LLE flexibility.    Time  4    Period  Weeks    Status  New    Target Date  08/26/17      PT LONG TERM GOAL #3   Title  Pt will improve gait velocity to at least 2.62 ft/sec for improved community gait.    Time  4    Period  Weeks    Status  New    Target Date  08/26/17      PT LONG TERM GOAL #4   Title  Pt will improve TUG score to less than or equal to 13.5 seconds for decreased fall risk, improved funcitonal mobility.    Time  4    Period  Weeks    Status  New  Target Date  08/26/17      PT LONG TERM GOAL #5   Title  Pt will  verbalize at least 3 means to reduce pain in L lower extremity for improved functional mobility.    Time  4    Period  Weeks    Status  New    Target Date  08/26/17         Plan - 08/24/17 1743    Clinical Impression Statement  Today's session focused on bilateral lower extremity stretching, strengthening, and increased endurance during ambulation and Nustep. Pt did present with LLE pain today, but pain maintained at baseline and reduced at the end of today's session. Pt is making progress towards LTG's and would benefit from continued PT interventions toward achieving unmet goals.     History and Personal Factors relevant to plan of care:  >3 systems involvement, 1 fall in last past 6 months; PMH > 3 co-morbidities    Clinical Presentation  Evolving    Clinical Presentation due to:  fall risk per TUG score; pain limiting mobility ( no formal diagnosis from MD)     Clinical Decision Making  Moderate    Rehab Potential  Good    PT Frequency  2x / week    PT Duration  4 weeks    PT Treatment/Interventions  ADLs/Self Care Home Management;Cryotherapy;DME Instruction;Moist Heat;Gait training;Functional mobility training;Therapeutic activities;Therapeutic exercise;Balance training;Patient/family education;Neuromuscular re-education;Manual techniques;Electrical Stimulation;Ultrasound;Dry needling    PT Next Visit Plan  Check LTG's due next week per Juliann Pulse, PTA who checked and next week is week 5 of the 5 week plan of care set at evaluation.  Follow up to check on DOMS and pain levels after today session and if pt still pain free. Modalities and manual therapy usage PRN.     Consulted and Agree with Plan of Care  Patient       Patient will benefit from skilled therapeutic intervention in order to improve the following deficits and impairments:  Abnormal gait, Decreased balance, Difficulty walking, Decreased mobility, Decreased strength, Impaired flexibility, Pain  Visit Diagnosis: Pain in left  lower leg  Other symptoms and signs involving the musculoskeletal system  Difficulty in walking, not elsewhere classified     Problem List Patient Active Problem List   Diagnosis Date Noted  . Morbid obesity ([place]) 07/04/2017  . Vitamin D deficiency 09/02/2014  . 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 Fatty liver disease, nonalcoholic 47/65/4650  . Essential hypertension 08/31/2013  . Medication management 08/31/2013  . T2_NIDDM   . Hyperlipidemia     Carlena Sax, SPTA  08/24/2017, 5:49 PM  [facility] [address] [place], [place], [address] Phone: [phone]   Fax:  [phone]  Name: DEMETRESS TIFT MRN: [id number] Date of Birth: December 21, 1955

## 2017-08-29 ENCOUNTER — Ambulatory Visit: Payer: 59 | Admitting: Physical Therapy

## 2017-08-29 ENCOUNTER — Encounter: Payer: Self-pay | Admitting: Physical Therapy

## 2017-08-29 DIAGNOSIS — G8929 Other chronic pain: Secondary | ICD-10-CM

## 2017-08-29 DIAGNOSIS — R262 Difficulty in walking, not elsewhere classified: Secondary | ICD-10-CM

## 2017-08-29 DIAGNOSIS — R29898 Other symptoms and signs involving the musculoskeletal system: Secondary | ICD-10-CM

## 2017-08-29 DIAGNOSIS — M79662 Pain in left lower leg: Secondary | ICD-10-CM

## 2017-08-29 DIAGNOSIS — M25561 Pain in right knee: Secondary | ICD-10-CM

## 2017-08-29 NOTE — Therapy (Addendum)
[facility] [address] [place], [place], [address] Phone: [phone]   Fax:  [phone]  Physical Therapy Treatment  Patient Details  Name: Tracy Garrett MRN: [id number] Date of Birth: 04-07-1956 Referring Provider: Liane Comber, NP   Encounter Date: 08/29/2017  PT End of Session - 08/29/17 1730    Visit Number  8    Number of Visits  9    Date for PT Re-Evaluation  09/24/17    Authorization Type  [employer]    PT Start Time  5852    PT Stop Time  7782    PT Time Calculation (min)  42 min    Equipment Utilized During Treatment  Gait belt    Activity Tolerance  Patient tolerated treatment well;No increased pain    Behavior During Therapy  WFL for tasks assessed/performed       Past Medical History:  Diagnosis Date  . Allergy   . Cataracts, bilateral   . Diabetes mellitus without complication ([place])   . History of kidney stones    x2 episodes  . Hyperlipidemia   . PONV (postoperative nausea and vomiting)   . Sleep apnea    test was dx, as mild- no further tx. required  . Type II or unspecified type diabetes mellitus without mention of complication, not stated as uncontrolled     Past Surgical History:  Procedure Laterality Date  . BREAST SURGERY  1999   Reduction Mammoplasties  . CHOLECYSTECTOMY  2003  . colonoscopsy  11/2007   neg- polyp removal with both procedures x2  . EUS N/A 07/26/2014   Procedure: UPPER ENDOSCOPIC ULTRASOUND (EUS) LINEAR;  Surgeon: Beryle Beams, MD;  Location: [facility];  Service: Endoscopy;  Laterality: N/A;  . kidney stones     surgery to extract x 1  . LAPAROTOMY N/A 10/10/2014   Procedure: EXPLORATORY LAPAROTOMY EXCISION OF 70mm DUODENAL CARCINOID TUMOR;  Surgeon: [name], MD;  Location: [facility];  Service: General;  Laterality: N/A;  . LYSIS OF ADHESION  10/10/2014   Procedure: LYSIS OF ADHESION;  Surgeon: [name], MD;  Location: [facility];   Service: General;;  . TONSILLECTOMY     and adenoids removed age 11  . VAGINAL HYSTERECTOMY  2002    There were no vitals filed for this visit.  Subjective Assessment - 08/29/17 1404    Subjective  Pt reported no changes in medications and no falls. However, pt stated she had "left leg muscle tightness is still there and I think is affecting my walk."     Patient Stated Goals  Pt's goals for physical therapy are to get rid of pain in leg.    Currently in Pain?  Yes    Pain Score  3     Pain Location  Leg    Pain Orientation  Left;Lower    Pain Descriptors / Indicators  Tightness    Pain Type  Chronic pain    Pain Onset  More than a month ago    Pain Frequency  Intermittent                [facility] Adult PT Treatment/Exercise - 08/29/17 1442      Bed Mobility   Bed Mobility  Sit to Supine    Sit to Supine  5: Supervision    Sit to Supine - Details (indicate cue type and reason)  For supine stretches.       Transfers   Transfers  Sit to Stand;Stand to Sit    Sit to Stand  6: Modified independent (Device/Increase time);With upper extremity assist;From chair/3-in-1    Stand to Sit  6: Modified independent (Device/Increase time);With upper extremity assist;To chair/3-in-1    Comments  x 5 sit to stands during rest breaks during today's session.      Ambulation/Gait   Ambulation/Gait  Yes    Ambulation/Gait Assistance  5: Supervision    Ambulation/Gait Assistance Details  Pt used cane today for majority of treatment. One time without cane for short distance.     Ambulation Distance (Feet)  115 Feet plus 115 feet x 2, plus 70 feet without cane.    Assistive device  Straight cane    Gait Pattern  Step-through pattern;Decreased stance time - left;Decreased step length - right;Antalgic;Lateral trunk lean to left    Ambulation Surface  Level;Indoor      Exercises   Exercises  Knee/Hip      Knee/Hip Exercises: Stretches   Active Hamstring Stretch  Left;3 reps;30  seconds;Right;Both    Quad Stretch  Both;3 reps;30 seconds;Left;Right    Gastroc Stretch  Both;Left;Right;30 seconds;3 reps In standing and in supine with strap.     Soleus Stretch  Both;Left;Right;30 seconds;3 reps Performed in supine with strap.       Knee/Hip Exercises: Aerobic   Nustep  x 5 min and 30 secs at level 4, with using LE's only. (Pace 80-84 step)       Knee/Hip Exercises: Machines for Strengthening   Total Gym Leg Press  Performed #60 lbs x 10 reps, #65 lbs x10 reps; with initial verbal cues for performance of task. Verbal cues for proper knee alignment provided.       Manual Therapy   Manual Therapy  Soft tissue mobilization;Other (comment);Myofascial release ;DTM    Manual therapy comments  SPTA performed manual therapy to Left gastroc with a focused on the lateral and medial portions. Noted tissue restrictions and on medial to lateral portions was cleared after manual therapy. Applied STM to Deep tissue mobilizations to decrease LLE muscle pain and tightness. Pt did notice immediate decreased muscle tightness after manual.              PT Education - 08/29/17 1730    Education provided  Yes    Education Details  Addition of Supine gastroc stretch and standing gastroc stretch added to HEP.     Person(s) Educated  Patient    Methods  Explanation;Handout    Comprehension  Verbalized understanding      Handout provided for patient to add to HEP.   Seated Gastroc Stretch with strap  Sit with legs out straight in front of you. Put a stretching strap or long towel around the toes of one foot. Pull your toes towards you until you feel a stretch in the back of your calf.     Gastroc Stretch at counter at home  Stand on [employer] with both feet. Slowly lean forward keeping both heels down on the board as shown. Lean until you feel a slight stretch in the back part of the calves. Remember to keep your knees straight. Powered by [employer] Created By Carlena Sax, SPTA Mar 25th, 2019 - Page 1 of 1      PT Long Term Goals - 07/26/17 1142      PT LONG TERM GOAL #1   Title  Pt will be independent with HEP for pain reduction, improved functional mobility, flexibility and strength.  TARGET 08/26/17    Time  4    Period  Weeks    Status  New    Target Date  08/26/17      PT LONG TERM GOAL #2   Title  Pt will demonstrate improved knee flexion and extension by 5 degrees for improved LLE flexibility.    Time  4    Period  Weeks    Status  New    Target Date  08/26/17      PT LONG TERM GOAL #3   Title  Pt will improve gait velocity to at least 2.62 ft/sec for improved community gait.    Time  4    Period  Weeks    Status  New    Target Date  08/26/17      PT LONG TERM GOAL #4   Title  Pt will improve TUG score to less than or equal to 13.5 seconds for decreased fall risk, improved funcitonal mobility.    Time  4    Period  Weeks    Status  New    Target Date  08/26/17      PT LONG TERM GOAL #5   Title  Pt will verbalize at least 3 means to reduce pain in L lower extremity for improved functional mobility.    Time  4    Period  Weeks    Status  New    Target Date  08/26/17            Plan - 08/29/17 1731    Clinical Impression Statement  Today's session focused on manual therapy to left gastroc region to decrease muscle tightness, left lower extremity stretching, and bilateral strerngthening. Pt continues to make steady progress towards LTG's meanwhile maintaining overall lower pain levels. Pt would benefit from continued PT interventions towards PT reaching unmet PT goals.     Clinical Presentation  Evolving    Clinical Decision Making  Moderate    PT Frequency  2x / week    PT Duration  4 weeks    PT Treatment/Interventions  ADLs/Self Care Home Management;Cryotherapy;DME Instruction;Moist Heat;Gait training;Functional mobility training;Therapeutic activities;Therapeutic exercise;Balance training;Patient/family  education;Neuromuscular re-education;Manual techniques;Electrical Stimulation;Ultrasound;Dry needling    PT Next Visit Plan  Check LTG's. D/C?     Consulted and Agree with Plan of Care  Patient       Patient will benefit from skilled therapeutic intervention in order to improve the following deficits and impairments:  Abnormal gait, Decreased balance, Difficulty walking, Decreased mobility, Decreased strength, Impaired flexibility, Pain  Visit Diagnosis: Pain in left lower leg  Other symptoms and signs involving the musculoskeletal system  Difficulty in walking, not elsewhere classified  Chronic pain of right knee     Problem List Patient Active Problem List   Diagnosis Date Noted  . Morbid obesity ([place]) 07/04/2017  . Vitamin D deficiency 09/02/2014  . 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 Fatty liver disease, nonalcoholic 23/55/7322  . Essential hypertension 08/31/2013  . Medication management 08/31/2013  . T2_NIDDM   . Hyperlipidemia     Carlena Sax, SPTA  08/29/2017, 5:35 PM  [facility] [address] [place], [place], [address] Phone: [phone]   Fax:  [phone]  Name: JHOSELYN RUFFINI MRN: [id number] Date of Birth: [birth year]   Plan - 09/01/17 2042    Clinical Impression Statement  Per discussion with PTA, will likely asssess LTGs next visit.  Please note recert to cover this  week for POC.    PT Frequency  2x / week    PT Duration  Other (comment) 1 week-per recert 2/99/37    PT Treatment/Interventions  ADLs/Self Care Home Management;Cryotherapy;DME Instruction;Moist Heat;Gait training;Functional mobility training;Therapeutic activities;Therapeutic exercise;Balance training;Patient/family education;Neuromuscular re-education;Manual techniques;Electrical Stimulation;Ultrasound;Dry needling    PT Next Visit Plan  Check LTG's. D/C?     Consulted and Agree with Plan of Care  Patient       Mady Haagensen, PT 09/01/17 8:46 PM Phone: [phone] Fax: [phone]

## 2017-08-29 NOTE — Patient Instructions (Addendum)
YO Seated Gastroc Stretch  Sit with legs out straight in front of you. Put a stretching strap or long towel around the toes of one foot. Pull your toes towards you until you feel a stretch in the back of your calf.     Gastroc Stretch  Stand on slant board with both feet. Slowly lean forward keeping both heels down on the board as shown. Lean until you feel a slight stretch in the back part of the calves. Remember to keep your knees straight. Powered by [employer] Created By Carlena Sax, SPTA Mar 25th, 2019 - Page 1 of 1

## 2017-09-01 ENCOUNTER — Ambulatory Visit: Payer: Self-pay | Admitting: Internal Medicine

## 2017-09-01 NOTE — Addendum Note (Signed)
Addended by: Frazier Butt on: 09/01/2017 08:49 PM   Modules accepted: Orders

## 2017-09-02 ENCOUNTER — Encounter: Payer: Self-pay | Admitting: Physical Therapy

## 2017-09-02 ENCOUNTER — Ambulatory Visit: Payer: 59 | Admitting: Physical Therapy

## 2017-09-02 DIAGNOSIS — R262 Difficulty in walking, not elsewhere classified: Secondary | ICD-10-CM

## 2017-09-02 DIAGNOSIS — R29898 Other symptoms and signs involving the musculoskeletal system: Secondary | ICD-10-CM

## 2017-09-02 DIAGNOSIS — M79662 Pain in left lower leg: Secondary | ICD-10-CM

## 2017-09-02 NOTE — Therapy (Signed)
[facility] [address] [place], [place], [address] Phone: [phone]   Fax:  [phone]  Physical Therapy Treatment  Patient Details  Name: Tracy Garrett MRN: [id number] Date of Birth: 05/31/1956 Referring Provider: Liane Comber, NP   Encounter Date: 09/02/2017  PT End of Session - 09/02/17 1640    Visit Number  9    Number of Visits  13 per recert 5/42/70    Date for PT Re-Evaluation  10/03/17    Authorization Type  [employer]    PT Start Time  0845    PT Stop Time  0930    PT Time Calculation (min)  45 min    Equipment Utilized During Treatment  Gait belt    Activity Tolerance  Patient tolerated treatment well;No increased pain    Behavior During Therapy  WFL for tasks assessed/performed       Past Medical History:  Diagnosis Date  . Allergy   . Cataracts, bilateral   . Diabetes mellitus without complication ([place])   . History of kidney stones    x2 episodes  . Hyperlipidemia   . PONV (postoperative nausea and vomiting)   . Sleep apnea    test was dx, as mild- no further tx. required  . Type II or unspecified type diabetes mellitus without mention of complication, not stated as uncontrolled     Past Surgical History:  Procedure Laterality Date  . BREAST SURGERY  1999   Reduction Mammoplasties  . CHOLECYSTECTOMY  2003  . colonoscopsy  11/2007   neg- polyp removal with both procedures x2  . EUS N/A 07/26/2014   Procedure: UPPER ENDOSCOPIC ULTRASOUND (EUS) LINEAR;  Surgeon: Beryle Beams, MD;  Location: [facility];  Service: Endoscopy;  Laterality: N/A;  . kidney stones     surgery to extract x 1  . LAPAROTOMY N/A 10/10/2014   Procedure: EXPLORATORY LAPAROTOMY EXCISION OF 14m DUODENAL CARCINOID TUMOR;  Surgeon: TJackolyn Confer MD;  Location: [facility];  Service: General;  Laterality: N/A;  . LYSIS OF ADHESION  10/10/2014   Procedure: LYSIS OF ADHESION;  Surgeon: TJackolyn Confer MD;   Location: [facility];  Service: General;;  . TONSILLECTOMY     and adenoids removed age 62 . VAGINAL HYSTERECTOMY  2002    There were no vitals filed for this visit.  Subjective Assessment - 09/02/17 0848    Subjective  Feel like my knee doesn't hurt as bad, overall some improvement noted.  Still have trouble fully straightening my L knee.    Patient Stated Goals  Pt's goals for physical therapy are to get rid of pain in leg.    Currently in Pain?  Yes    Pain Score  7     Pain Location  Leg    Pain Orientation  Left;Lower    Pain Type  Chronic pain    Pain Onset  More than a month ago    Pain Frequency  Intermittent    Aggravating Factors   unsure    Pain Relieving Factors   massage, hot pack, exercises                No data recorded       [facility] Adult PT Treatment/Exercise - 09/02/17 0001      Ambulation/Gait   Ambulation/Gait  Yes    Ambulation/Gait Assistance  6: Modified independent (Device/Increase time)    Ambulation/Gait Assistance Details  Short distance gait without cane-increased Trendelenburg pattern noted.  Adjusted cane to slightly lower height    Ambulation Distance (Feet)  115 Feet x 2, then 80 ft x 2    Assistive device  Straight cane    Gait Pattern  Step-through pattern;Decreased stance time - left;Decreased step length - right;Antalgic;Lateral trunk lean to left    Ambulation Surface  Level;Indoor    Gait velocity  12.25 sec = 2.67 no device:  12.66 sec (2.59 ft/sec)      Standardized Balance Assessment   Standardized Balance Assessment  Timed Up and Go Test      Timed Up and Go Test   TUG  Normal TUG    Normal TUG (seconds)  11.81      Self-Care   Self-Care  Other Self-Care Comments    Other Self-Care Comments   Discussed progress towards goals, discussed continued pain and gains made with PT; briefly discussed patient's interest in community fitness and discussed options such as [facility] exercise room and aquatic therapy       Exercises   Exercises  Knee/Hip;Other Exercises    Other Exercises   Attempted use of roller in supine and seated positions to assist with pt-performing manual therapy/massage with roller to L gastroc soleus area, but pt has difficulty performing correctly. Discussed ways to decrease pain:  stretching, heat, massage      Knee/Hip Exercises: Stretches   Active Hamstring Stretch  Right;3 reps;30 seconds;5 reps Review of HEP; also practiced foot propped version    Gastroc Stretch  Both;Left;Right;30 seconds;3 reps seated with strap, then standing propped on 2" block    Other Knee/Hip Stretches  Knee flexion 116 degrees; extension -8 degrees from neutral    Other Knee/Hip Stretches  Review of HEP-pt return demo understanding.      Knee/Hip Exercises: Standing   Other Standing Knee Exercises  Tandem stance, tandem gait along counter forward/back for hip stability with gait activities             PT Education - 09/02/17 1638    Education provided  Yes    Education Details  Review of HEP given last visit; POC and progress towards goals; discussed additional 2-4 visits of PT for further manual therapy and instructions for patient for home as well as transition to community fitness.    Person(s) Educated  Patient    Methods  Explanation    Comprehension  Verbalized understanding          PT Long Term Goals - 09/02/17 1644      PT LONG TERM GOAL #1   Title  Pt will be independent with HEP for pain reduction, improved functional mobility, flexibility and strength.  UPDATED TARGET for LTGs 09/02/17    Time  1 per recert 1/61/09    Period  Weeks    Status  Achieved      PT LONG TERM GOAL #2   Title  Pt will demonstrate improved knee flexion and extension by 5 degrees for improved LLE flexibility.    Time  1    Period  Weeks    Status  Partially Met      PT LONG TERM GOAL #3   Title  Pt will improve gait velocity to at least 2.62 ft/sec for improved community gait.    Baseline  with  cane 2.67 ft/sec    Time  1    Period  Weeks    Status  Achieved      PT LONG TERM GOAL #4   Title  Pt will improve TUG score to less than or equal to 13.5 seconds for decreased fall risk, improved funcitonal mobility.    Time  1    Period  Weeks    Status  Achieved      PT LONG TERM GOAL #5   Title  Pt will verbalize at least 3 means to reduce pain in L lower extremity for improved functional mobility.    Time  1    Period  Weeks    Status  Achieved            Plan - 09/02/17 1640    Clinical Impression Statement  Assessed pt's LTGs this week, with pt meeting LTG 1, 3, 4,5.  LTG 2 partially met, with 3 degree improvement in ROM.  Pt feels her pain has improved somewhat but is still a limiting factor with mobility (without cane) and transition to community fitness.  Will benefit from addition 2-4 visits of PT to update HEP as needed, to work on manual therapy instruction for home, potentially using foam roller for pt-assisted massage, and to work towards transition to [employer].  Please noted updated goals.    PT Frequency  2x / week    PT Duration  2 weeks per recert 2/42/35    PT Treatment/Interventions  ADLs/Self Care Home Management;Cryotherapy;DME Instruction;Moist Heat;Gait training;Functional mobility training;Therapeutic activities;Therapeutic exercise;Balance training;Patient/family education;Neuromuscular re-education;Manual techniques;Electrical Stimulation;Ultrasound;Dry needling    PT Next Visit Plan  Manual therapy to lower leg as needed with instruction for gastrocs (using foam roller?) for home; strengthening machines with education in plans for community fitness transition    Consulted and Agree with Plan of Care  Patient       Patient will benefit from skilled therapeutic intervention in order to improve the following deficits and impairments:  Abnormal gait, Decreased balance, Difficulty walking, Decreased mobility, Decreased strength, Impaired  flexibility, Pain  Visit Diagnosis: Pain in left lower leg  Other symptoms and signs involving the musculoskeletal system  Difficulty in walking, not elsewhere classified     Problem List Patient Active Problem List   Diagnosis Date Noted  . Morbid obesity ([place]) 07/04/2017  . Vitamin D deficiency 09/02/2014  . 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 Fatty liver disease, nonalcoholic 36/14/4315  . Essential hypertension 08/31/2013  . Medication management 08/31/2013  . T2_NIDDM   . Hyperlipidemia     Estrella Alcaraz W. 09/02/2017, 4:49 PM  Frazier Butt., PT   [facility] [address] [place], [place], [address] Phone: [phone]   Fax:  [phone]  Name: MARIAISABEL BODIFORD MRN: [id number] Date of Birth: Jun 20, 1955

## 2017-09-06 ENCOUNTER — Encounter: Payer: Self-pay | Admitting: Physical Therapy

## 2017-09-06 ENCOUNTER — Ambulatory Visit: Payer: 59 | Attending: Adult Health | Admitting: Physical Therapy

## 2017-09-06 DIAGNOSIS — R262 Difficulty in walking, not elsewhere classified: Secondary | ICD-10-CM

## 2017-09-06 DIAGNOSIS — M79662 Pain in left lower leg: Secondary | ICD-10-CM

## 2017-09-07 ENCOUNTER — Ambulatory Visit: Payer: 59 | Admitting: Physical Therapy

## 2017-09-08 NOTE — Therapy (Signed)
[place] [address] [place], [place], [address] Phone: [phone]   Fax:  [phone]  Physical Therapy Treatment  Patient Details  Name: Tracy Garrett MRN: [id number] Date of Birth: 1956-05-23 Referring Provider: Liane Comber, NP   Encounter Date: 09/06/2017     09/06/17 1537  PT Visits / Re-Eval  Visit Number 10  Number of Visits 13 (per recert 2/70/62)  Date for PT Re-Evaluation 10/03/17  Authorization  Authorization Type [place]  PT Time Calculation  PT Start Time 3762  PT Stop Time 8315 (discharge visist, not all time was needed)  PT Time Calculation (min) 32 min  PT - End of Session  Activity Tolerance Patient tolerated treatment well;No increased pain  Behavior During Therapy WFL for tasks assessed/performed    Past Medical History:  Diagnosis Date  . Allergy   . Cataracts, bilateral   . Diabetes mellitus without complication ([place])   . History of kidney stones    x2 episodes  . Hyperlipidemia   . PONV (postoperative nausea and vomiting)   . Sleep apnea    test was dx, as mild- no further tx. required  . Type II or unspecified type diabetes mellitus without mention of complication, not stated as uncontrolled     Past Surgical History:  Procedure Laterality Date  . BREAST SURGERY  1999   Reduction Mammoplasties  . CHOLECYSTECTOMY  2003  . colonoscopsy  11/2007   neg- polyp removal with both procedures x2  . EUS N/A 07/26/2014   Procedure: UPPER ENDOSCOPIC ULTRASOUND (EUS) LINEAR;  Surgeon: Beryle Beams, MD;  Location: [facility];  Service: Endoscopy;  Laterality: N/A;  . kidney stones     surgery to extract x 1  . 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 LYSIS OF ADHESION  10/10/2014   Procedure: LYSIS OF ADHESION;   Surgeon: [name], MD;  Location: [facility];  Service: General;;  . TONSILLECTOMY     and adenoids removed age 46  . VAGINAL HYSTERECTOMY  2002    There were no vitals filed for this visit.   09/06/17 1535  Symptoms/Limitations  Subjective Pt reports she want's to make today her last day. Still with calf pain mostly, knee pain comes and goes.   Patient Stated Goals Pt's goals for physical therapy are to get rid of pain in leg.  Pain Assessment  Currently in Pain? Yes  Pain Score 4  Pain Location Leg  Pain Orientation Left;Lower  Pain Descriptors / Indicators Aching;Tightness  Pain Type Chronic pain  Pain Onset More than a month ago  Pain Frequency Intermittent  Aggravating Factors  unsure  Pain Relieving Factors massage, hot pack, exercises      09/06/17 1554  Self-Care  Self-Care Other Self-Care Comments  Other Self-Care Comments  pt given information for [employer] and [employer] for continued fitness.   Manual Therapy  Manual Therapy Soft tissue mobilization  Manual therapy comments soft tissue mobs to left calf. reinstructed pt in use of foam roll to IT band. Pt plans to purchase one and have her spouse roll her leg with it as needed.   Soft tissue mobilization to left IT band/lateral aspect of LLE/ left gastroc and peroneal areas  Other Manual Therapy use of small white foam roll to IT band intiating with light pressure and  gradually increasing pressure as band relaxed and pt tolerated.       PT Long Term Goals - 09/06/17 1538      PT LONG TERM GOAL #1   Title  Pt will be independent with HEP for pain reduction, improved functional mobility, flexibility and strength.  UPDATED [employer] for LTGs 10/03/17    Baseline  09/06/17: pt is able to vocalize pain management techniques and reports independence with current HEP. She was given information for local gyms and plans to look into one of them.     Time  -- per recert 10/07/75    Period  --    Status  Achieved      PT  LONG TERM GOAL #2   Title  Pt will verbalize plans for transition to community fitness upon D/C from PT.    Baseline  09/06/17: pt plans to join either the [employer] or [place] #3   Title  Pt will rate pain in LLE at least 2-3 points lower than 09/02/17 visit (7/10) during functional activities.    Baseline  09/06/17: 4/10 pain reported today after working all day    Time  --    Period  --    Status  Achieved      PT LONG TERM GOAL #4   Title         PT LONG TERM GOAL #5   Title            09/06/17 1537  Plan  Clinical Impression Statement Today' skilled session focused on review with performance of manual therapy and use of foam roll for decreased LE pain and muscle tightness. Pt plans to have her spouse use the roll on her leg as she can not reach the areas. Also gave pt information on [employer] near her and [facility] for post rehab continued fitness. Discussed how aquatics may also help her with gentle strengthening due to the bounyancy effect it creates. Pt plans to look into these as well as folllow up with her MD if knee pain persists. Pt requesting discharge today.   Pt will benefit from skilled therapeutic intervention in order to improve on the following deficits Abnormal gait;Decreased balance;Difficulty walking;Decreased mobility;Decreased strength;Impaired flexibility;Pain  PT Frequency 2x / week  PT Duration 2 weeks (per recert 09/16/85)  PT Treatment/Interventions ADLs/Self Care Home Management;Cryotherapy;DME Instruction;Moist Heat;Gait training;Functional mobility training;Therapeutic activities;Therapeutic exercise;Balance training;Patient/family education;Neuromuscular re-education;Manual techniques;Electrical Stimulation;Ultrasound;Dry needling  PT Next Visit Plan discharge per pt request.   Consulted and Agree with Plan of Care Patient          Patient will benefit from skilled  therapeutic intervention in order to improve the following deficits and impairments:  Abnormal gait, Decreased balance, Difficulty walking, Decreased mobility, Decreased strength, Impaired flexibility, Pain  Visit Diagnosis: Pain in left lower leg  Difficulty in walking, not elsewhere classified     Problem List Patient Active Problem List   Diagnosis Date Noted  . Morbid obesity ([place]) 07/04/2017  . Vitamin D deficiency 09/02/2014  . 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 Fatty liver disease, nonalcoholic 86/76/7209  . Essential hypertension 08/31/2013  . Medication management 08/31/2013  . T2_NIDDM   . Hyperlipidemia     Willow Ora, PTA, [place] [address], [place], [place] [address] [phone] 09/08/17,  12:30 PM   Name: Tracy Garrett MRN: [id number] Date of Birth: 1956/01/02

## 2017-09-26 ENCOUNTER — Ambulatory Visit: Payer: 59 | Admitting: Physical Therapy

## 2017-09-27 ENCOUNTER — Ambulatory Visit: Payer: 59 | Admitting: Physical Therapy

## 2017-09-29 NOTE — Progress Notes (Signed)
This very nice 62 y.o. MBF presents for 6 month follow up with HTN, HLD, T2_NIDDM and Vitamin D Deficiency. Patient has been evaluated by Dr Brett Fairy and OSA was ruled out. She does have GERD controlled on her meds.      Patient is monitored expectantly for labile HTN since 1998 & BP has been controlled at home. Today's BP was initially sl elevated and rechecked at goal - 138/78. Patient has had no complaints of any cardiac type chest pain, palpitations, dyspnea / orthopnea / PND, dizziness, claudication, or dependent edema.     Hyperlipidemia is not controlled with diet & patient is reticent to take meds for cholesterol.  Patient was educated in the importance of control of cholesterol an her high risk for heart attack, stroke, cancer & Dementia with her untreated high lipids. Last Lipids were not at goal: Lab Results  Component Value Date   CHOL 256 (H) 07/05/2017   HDL 70 07/05/2017   LDLCALC 153 (H) 07/05/2017   TRIG 194 (H) 07/05/2017   CHOLHDL 3.7 07/05/2017      Also, the patient has history of Morbid Obesity (BMI 39+) and consequent T2_NIDDM /CKD2 and has had no symptoms of reactive hypoglycemia, diabetic polys, paresthesias or visual blurring.  She is poorly compliant with diet and morbidly Obese with BMI 40+ and last A1c was not at goal: Lab Results  Component Value Date   HGBA1C 7.4 (H) 07/05/2017      Further, the patient also has history of Vitamin D Deficiency ("16"/2008) and supplements vitamin D without any suspected side-effects. Last vitamin D was at goal: Lab Results  Component Value Date   VD25OH 59 02/28/2017   Current Outpatient Medications on File Prior to Visit  Medication Sig  . acetaminophen (TYLENOL) 500 MG tablet Take 1,000 mg by mouth every 6 (six) hours as needed for mild pain.  . Cholecalciferol (VITAMIN D3) 5000 UNITS TABS Take 1 tablet by mouth every other day.   . diphenhydrAMINE (BENADRYL) 25 mg capsule Take 25 mg by mouth every 6 (six) hours as  needed for itching.  . gabapentin (NEURONTIN) 300 MG capsule Take 1 tablet 3 x day for pain  . glucose blood (CONTOUR NEXT TEST) test strip Check blood sugar 1 time daily-DX-E11.9  . hydrocortisone cream 1 % Apply 1 application topically 3 (three) times daily as needed for itching.  . metFORMIN (GLUCOPHAGE-XR) 500 MG 24 hr tablet TAKE 1 TABLET 2 X DAY AFTER BREAKFAST &LUNCH AND 2 TABLETS AFTER SUPPER FOR DIABETES  . Omega-3 Fatty Acids (OMEGA 3 PO) Take 1 capsule by mouth 2 (two) times daily.  Marland Kitchen OVER THE COUNTER MEDICATION Takes CoQ10 1 capsule daily  . Red Yeast Rice Extract (RED YEAST RICE PO) Take 1 capsule by mouth daily.  . TURMERIC PO Take 1 tablet by mouth daily.   No current facility-administered medications on file prior to visit.    Allergies  Allergen Reactions  . Other     NUTS CAUSE MOUTH TO [place]  . Hydromorphone Rash    Arm reddened at IV med site   PMHx:   Past Medical History:  Diagnosis Date  . Allergy   . Cataracts, bilateral   . Diabetes mellitus without complication ([place])   . History of kidney stones    x2 episodes  . Hyperlipidemia   . PONV (postoperative nausea and vomiting)   . Sleep apnea    test was dx, as mild- no further tx. required  .  Type II or unspecified type diabetes mellitus without mention of complication, not stated as uncontrolled    Immunization History  Administered Date(s) Administered  . Influenza-Unspecified 03/10/2015  . PPD Test 08/31/2013, 09/02/2014, 02/28/2017  . Tdap 05/05/2009   Past Surgical History:  Procedure Laterality Date  . BREAST SURGERY  1999   Reduction Mammoplasties  . CHOLECYSTECTOMY  2003  . colonoscopsy  11/2007   neg- polyp removal with both procedures x2  . EUS N/A 07/26/2014   Procedure: UPPER ENDOSCOPIC ULTRASOUND (EUS) LINEAR;  Surgeon: Beryle Beams, MD;  [facility];  Service: Endoscopy;  Laterality: N/A;  . kidney stones     surgery to extract x 1  . LAPAROTOMY N/A 10/10/2014   Procedure:  EXPLORATORY LAPAROTOMY EXCISION OF 74mm DUODENAL CARCINOID TUMOR;  Surgeon: [name], MD;  Location: [facility];  Service: General;  Laterality: N/A;  . LYSIS OF ADHESION  10/10/2014   Procedure: LYSIS OF ADHESION;  Surgeon: [name], MD;  Location: [facility];  Service: General;;  . TONSILLECTOMY     and adenoids removed age 44  . VAGINAL HYSTERECTOMY  2002   FHx:    Reviewed / unchanged  SHx:    Reviewed / unchanged  Systems Review:  Constitutional: Denies fever, chills, wt changes, headaches, insomnia, fatigue, night sweats, change in appetite. Eyes: Denies redness, blurred vision, diplopia, discharge, itchy, watery eyes.  ENT: Denies discharge, congestion, post nasal drip, epistaxis, sore throat, earache, hearing loss, dental pain, tinnitus, vertigo, sinus pain, snoring.  CV: Denies chest pain, palpitations, irregular heartbeat, syncope, dyspnea, diaphoresis, orthopnea, PND, claudication or edema. Respiratory: denies cough, dyspnea, DOE, pleurisy, hoarseness, laryngitis, wheezing.  Gastrointestinal: Denies dysphagia, odynophagia, heartburn, reflux, water brash, abdominal pain or cramps, nausea, vomiting, bloating, diarrhea, constipation, hematemesis, melena, hematochezia  or hemorrhoids. Genitourinary: Denies dysuria, frequency, urgency, nocturia, hesitancy, discharge, hematuria or flank pain. Musculoskeletal: Denies arthralgias, myalgias, stiffness, jt. swelling, pain, limping or strain/sprain.  Skin: Denies pruritus, rash, hives, warts, acne, eczema or change in skin lesion(s). Neuro: No weakness, tremor, incoordination, spasms, paresthesia or pain. Psychiatric: Denies confusion, memory loss or sensory loss. Endo: Denies change in weight, skin or hair change.  Heme/Lymph: No excessive bleeding, bruising or enlarged lymph nodes.  Physical Exam  BP 138/78   Pulse 76   Temp 97.7 F (36.5 C)   Resp 16   Ht 5' 2.5" (1.588 m)   Wt 223 lb 9.6 oz (101.4 kg)   BMI 40.25 kg/m    Appears  Over  nourished, well groomed  and in no distress.  Eyes: PERRLA, EOMs, conjunctiva no swelling or erythema. Sinuses: No frontal/maxillary tenderness ENT/Mouth: EAC's clear, TM's nl w/o erythema, bulging. Nares clear w/o erythema, swelling, exudates. Oropharynx clear without erythema or exudates. Oral hygiene is good. Tongue normal, non obstructing. Hearing intact.  Neck: Supple. Thyroid not palpable. Car 2+/2+ without bruits, nodes or JVD. Chest: Respirations nl with BS clear & equal w/o rales, rhonchi, wheezing or stridor.  Cor: Heart sounds normal w/ regular rate and rhythm without sig. murmurs, gallops, clicks or rubs. Peripheral pulses normal and equal  without edema.  Abdomen: Soft & bowel sounds normal. Non-tender w/o guarding, rebound, hernias, masses or organomegaly.  Lymphatics: Unremarkable.  Musculoskeletal: Full ROM all peripheral extremities, joint stability, 5/5 strength and normal gait.  Skin: Warm, dry without exposed rashes, lesions or ecchymosis apparent.  Neuro: Cranial nerves intact, reflexes equal bilaterally. Sensory-motor testing grossly intact. Tendon reflexes grossly intact.  Pysch: Alert & oriented x 3.  Insight and judgement nl & appropriate. No ideations.  Assessment and Plan:  1. Essential hypertension  - Continue medication, monitor blood pressure at home.  - Continue DASH diet.  Reminder to go to the ER if any CP,  SOB, nausea, dizziness, severe HA, changes vision/speech.  - CBC with Differential/Platelet - COMPLETE METABOLIC PANEL WITH GFR - Magnesium - TSH  2. Hyperlipidemia, mixed  - Continue diet/meds, exercise,& lifestyle modifications.  - Continue monitor periodic cholesterol/liver & renal functions   - Lipid panel - TSH  3. Type 2 diabetes mellitus with stage 2 chronic kidney disease, without long-term current use of insulin ([name])  - Continue diet, exercise, lifestyle modifications.  - Monitor appropriate labs.  - Hemoglobin  A1c - Insulin, random  4. Vitamin D deficiency  - Continue supplementation.   - VITAMIN D 25 Hydroxyl  5. Obesity (BMI 40+)   6. Medication management   - CBC with Differential/Platelet - COMPLETE METABOLIC PANEL WITH GFR - Magnesium - Lipid panel - TSH - Hemoglobin A1c - Insulin, random - VITAMIN D 25 Hydroxyl      Discussed  regular exercise, BP monitoring, weight control to achieve/maintain BMI less than 25 and discussed med and SE's. Recommended labs to assess and monitor clinical status with further disposition pending results of labs. Over 30 minutes of exam, counseling, chart review was performed.

## 2017-09-29 NOTE — Patient Instructions (Signed)

## 2017-09-30 ENCOUNTER — Ambulatory Visit: Payer: 59 | Admitting: Internal Medicine

## 2017-09-30 VITALS — BP 138/78 | HR 76 | Temp 97.7°F | Resp 16 | Ht 62.5 in | Wt 223.6 lb

## 2017-09-30 DIAGNOSIS — E559 Vitamin D deficiency, unspecified: Secondary | ICD-10-CM | POA: Diagnosis not present

## 2017-09-30 DIAGNOSIS — I10 Essential (primary) hypertension: Secondary | ICD-10-CM

## 2017-09-30 DIAGNOSIS — E782 Mixed hyperlipidemia: Secondary | ICD-10-CM | POA: Diagnosis not present

## 2017-09-30 DIAGNOSIS — E1122 Type 2 diabetes mellitus with diabetic chronic kidney disease: Secondary | ICD-10-CM | POA: Diagnosis not present

## 2017-09-30 DIAGNOSIS — N182 Chronic kidney disease, stage 2 (mild): Secondary | ICD-10-CM | POA: Diagnosis not present

## 2017-09-30 DIAGNOSIS — Z79899 Other long term (current) drug therapy: Secondary | ICD-10-CM

## 2017-09-30 DIAGNOSIS — E669 Obesity, unspecified: Secondary | ICD-10-CM | POA: Diagnosis not present

## 2017-09-30 MED ORDER — PHENTERMINE HCL 37.5 MG PO TABS: ORAL_TABLET | ORAL | 5 refills | Status: DC

## 2017-10-01 ENCOUNTER — Encounter: Payer: Self-pay | Admitting: Internal Medicine

## 2017-10-03 LAB — COMPLETE METABOLIC PANEL WITH GFR
AG Ratio: 1.5 (calc) (ref 1.0–2.5)
ALBUMIN MSPROF: 4.2 g/dL (ref 3.6–5.1)
ALKALINE PHOSPHATASE (APISO): 77 U/L (ref 33–130)
ALT: 19 U/L (ref 6–29)
AST: 15 U/L (ref 10–35)
BUN: 13 mg/dL (ref 7–25)
CO2: 30 mmol/L (ref 20–32)
Calcium: 9 mg/dL (ref 8.6–10.4)
Chloride: 101 mmol/L (ref 98–110)
Creat: 0.83 mg/dL (ref 0.50–0.99)
GFR, EST AFRICAN AMERICAN: 88 mL/min/{1.73_m2} (ref >=60)
GFR, Est Non African American: 76 mL/min/{1.73_m2} (ref >=60)
GLUCOSE: 162 mg/dL — AB (ref 65–99)
Globulin: 2.8 g/dL (calc) (ref 1.9–3.7)
Potassium: 4.3 mmol/L (ref 3.5–5.3)
Sodium: 141 mmol/L (ref 135–146)
TOTAL PROTEIN: 7 g/dL (ref 6.1–8.1)
Total Bilirubin: 0.4 mg/dL (ref 0.2–1.2)

## 2017-10-03 LAB — CBC WITH DIFFERENTIAL/PLATELET
Basophils Absolute: 29 cells/uL (ref 0–200)
Basophils Relative: 0.3 %
EOS ABS: 320 {cells}/uL (ref 15–500)
Eosinophils Relative: 3.3 %
HEMATOCRIT: 37.9 % (ref 35.0–45.0)
HEMOGLOBIN: 13.1 g/dL (ref 11.7–15.5)
Lymphs Abs: 3453 cells/uL (ref 850–3900)
MCH: 30 pg (ref 27.0–33.0)
MCHC: 34.6 g/dL (ref 32.0–36.0)
MCV: 86.9 fL (ref 80.0–100.0)
MPV: 11 fL (ref 7.5–12.5)
Monocytes Relative: 8.1 %
Neutro Abs: 5112 cells/uL (ref 1500–7800)
Neutrophils Relative %: 52.7 %
Platelets: 278 10*3/uL (ref 140–400)
RBC: 4.36 10*6/uL (ref 3.80–5.10)
RDW: 13.4 % (ref 11.0–15.0)
TOTAL LYMPHOCYTE: 35.6 %
WBC: 9.7 10*3/uL (ref 3.8–10.8)
WBCMIX: 786 {cells}/uL (ref 200–950)

## 2017-10-03 LAB — LIPID PANEL
Cholesterol: 217 mg/dL — ABNORMAL HIGH (ref <200)
HDL: 55 mg/dL (ref >50)
LDL CHOLESTEROL (CALC): 130 mg/dL — AB
Non-HDL Cholesterol (Calc): 162 mg/dL (calc) — ABNORMAL HIGH (ref <130)
TRIGLYCERIDES: 188 mg/dL — AB (ref <150)
Total CHOL/HDL Ratio: 3.9 (calc) (ref <5.0)

## 2017-10-03 LAB — HEMOGLOBIN A1C
Hgb A1c MFr Bld: 7.6 % of total Hgb — ABNORMAL HIGH (ref <5.7)
Mean Plasma Glucose: 171 (calc)
eAG (mmol/L): 9.5 (calc)

## 2017-10-03 LAB — VITAMIN D 25 HYDROXY (VIT D DEFICIENCY, FRACTURES): Vit D, 25-Hydroxy: 60 ng/mL (ref 30–100)

## 2017-10-03 LAB — INSULIN, RANDOM: INSULIN: 15 u[IU]/mL (ref 2.0–19.6)

## 2017-10-03 LAB — MAGNESIUM: MAGNESIUM: 1.7 mg/dL (ref 1.5–2.5)

## 2017-10-03 LAB — TSH: TSH: 1.76 m[IU]/L (ref 0.40–4.50)

## 2017-10-11 DIAGNOSIS — R05 Cough: Secondary | ICD-10-CM | POA: Diagnosis not present

## 2017-10-11 DIAGNOSIS — J019 Acute sinusitis, unspecified: Secondary | ICD-10-CM | POA: Diagnosis not present

## 2017-11-21 ENCOUNTER — Other Ambulatory Visit: Payer: Self-pay | Admitting: Physician Assistant

## 2017-11-21 DIAGNOSIS — E119 Type 2 diabetes mellitus without complications: Secondary | ICD-10-CM

## 2017-11-21 DIAGNOSIS — E0841 Diabetes mellitus due to underlying condition with diabetic mononeuropathy: Secondary | ICD-10-CM

## 2018-01-23 ENCOUNTER — Ambulatory Visit: Payer: Self-pay | Admitting: Adult Health

## 2018-02-17 DIAGNOSIS — E119 Type 2 diabetes mellitus without complications: Secondary | ICD-10-CM | POA: Diagnosis not present

## 2018-02-17 LAB — HM DIABETES EYE EXAM

## 2018-02-20 ENCOUNTER — Ambulatory Visit: Payer: Self-pay | Admitting: Adult Health

## 2018-02-22 ENCOUNTER — Encounter: Payer: Self-pay | Admitting: *Deleted

## 2018-03-23 ENCOUNTER — Encounter: Payer: Self-pay | Admitting: Internal Medicine

## 2018-04-03 DIAGNOSIS — R05 Cough: Secondary | ICD-10-CM | POA: Diagnosis not present

## 2018-04-03 DIAGNOSIS — R0982 Postnasal drip: Secondary | ICD-10-CM | POA: Diagnosis not present

## 2018-04-14 DIAGNOSIS — Z803 Family history of malignant neoplasm of breast: Secondary | ICD-10-CM | POA: Diagnosis not present

## 2018-04-14 DIAGNOSIS — Z1231 Encounter for screening mammogram for malignant neoplasm of breast: Secondary | ICD-10-CM | POA: Diagnosis not present

## 2018-04-14 LAB — HM MAMMOGRAPHY

## 2018-04-26 ENCOUNTER — Encounter: Payer: Self-pay | Admitting: Internal Medicine

## 2018-05-12 ENCOUNTER — Encounter: Payer: Self-pay | Admitting: Internal Medicine

## 2018-06-26 ENCOUNTER — Other Ambulatory Visit: Payer: Self-pay | Admitting: Adult Health

## 2018-06-26 DIAGNOSIS — E0841 Diabetes mellitus due to underlying condition with diabetic mononeuropathy: Secondary | ICD-10-CM

## 2018-06-26 DIAGNOSIS — E119 Type 2 diabetes mellitus without complications: Secondary | ICD-10-CM

## 2018-06-26 MED ORDER — METFORMIN HCL ER 500 MG PO TB24: ORAL_TABLET | ORAL | 1 refills | Status: DC

## 2018-08-02 DIAGNOSIS — J301 Allergic rhinitis due to pollen: Secondary | ICD-10-CM | POA: Diagnosis not present

## 2018-08-02 DIAGNOSIS — J3089 Other allergic rhinitis: Secondary | ICD-10-CM | POA: Diagnosis not present

## 2018-08-02 DIAGNOSIS — H1045 Other chronic allergic conjunctivitis: Secondary | ICD-10-CM | POA: Diagnosis not present

## 2018-08-08 ENCOUNTER — Ambulatory Visit: Payer: 59 | Admitting: Allergy and Immunology

## 2018-08-14 ENCOUNTER — Ambulatory Visit: Payer: 59 | Admitting: Adult Health

## 2018-08-14 ENCOUNTER — Encounter: Payer: Self-pay | Admitting: Adult Health

## 2018-08-14 VITALS — BP 122/80 | HR 86 | Temp 97.7°F | Ht 62.5 in | Wt 219.0 lb

## 2018-08-14 DIAGNOSIS — K760 Fatty (change of) liver, not elsewhere classified: Secondary | ICD-10-CM | POA: Diagnosis not present

## 2018-08-14 DIAGNOSIS — E785 Hyperlipidemia, unspecified: Secondary | ICD-10-CM

## 2018-08-14 DIAGNOSIS — E559 Vitamin D deficiency, unspecified: Secondary | ICD-10-CM

## 2018-08-14 DIAGNOSIS — E119 Type 2 diabetes mellitus without complications: Secondary | ICD-10-CM | POA: Diagnosis not present

## 2018-08-14 DIAGNOSIS — Z79899 Other long term (current) drug therapy: Secondary | ICD-10-CM

## 2018-08-14 DIAGNOSIS — E1169 Type 2 diabetes mellitus with other specified complication: Secondary | ICD-10-CM | POA: Diagnosis not present

## 2018-08-14 DIAGNOSIS — G587 Mononeuritis multiplex: Secondary | ICD-10-CM

## 2018-08-14 DIAGNOSIS — I10 Essential (primary) hypertension: Secondary | ICD-10-CM | POA: Diagnosis not present

## 2018-08-14 MED ORDER — GABAPENTIN 300 MG PO CAPS: ORAL_CAPSULE | ORAL | 2 refills | Status: DC

## 2018-08-14 NOTE — Progress Notes (Signed)
FOLLOW UP  Assessment and Plan:   Hypertension Well controlled without medications Monitor blood pressure at home; patient to call if consistently greater than 130/80 Continue DASH diet.   Reminder to go to the ER if any CP, SOB, nausea, dizziness, severe HA, changes vision/speech, left arm numbness and tingling and jaw pain.  Cholesterol Currently above goal; she is strongly opposed to statin despite discussion of risks and benefits. Wants to try "natural" methods - advised red rice yeast with taper up, omega 3/fish oil, fiber supplement with guidelines for dietary improvement Continue low cholesterol diet and exercise.  Check lipid panel.   Diabetes without complications Continue medication: metformin 500 mg 4 tabs daily - try 1/2 tab, swallowing aids discussed, will call pharmacy to verify options if any are smaller, synjardy samples provided to try Will have CMA call to verify progress in 1 week  Continue diet and exercise, efforts with weight loss Perform daily foot/skin check, notify office of any concerning changes.  Check A1C  Obesity with co morbidities Long discussion about weight loss, diet, and exercise Recommended diet heavy in fruits and veggies and low in animal meats, cheeses, and dairy products, appropriate calorie intake Discussed ideal weight for height  Continue with good dietary choices that she has been following - discussed moderation and ability to maintain long term is important rather than quick weight loss Will follow up in 3 months  Vitamin D Def At goal at last visit; continue supplementation to maintain goal of 60-100 Defer Vit D level  Continue diet and meds as discussed. Further disposition pending results of labs. Discussed med's effects and SE's.   Over 30 minutes of exam, counseling, chart review, and critical decision making was performed.   Future Appointments  Date Time Provider [place]  09/05/2018  9:00 AM Liane Comber, NP  [facility] None    ----------------------------------------------------------------------------------------------------------------------  HPI 63 y.o. female  presents for 3 month follow up on hypertension, cholesterol, diabetes, obesity and vitamin D deficiency.   She has recently had a repeat sleep study which was negative for OSA. She was recommended to continue with weight loss. She does have ongoing difficulty sleeping/staying asleep - she is not currently taking anything for this. She is prescribed gabapentin for pain but has not been taking - suggested she try 1-2 tabs of this at night for sleep which she reported did help but needs refill.   BMI is Body mass index is 39.42 kg/m., she has not been working on diet/exericse other than some walking in a group on breaks (10 min) a few days a week. She reports she is reducing her carb intake, increasing water intake. She cut out sweets, bread and red meat intermittently. She reports she was doing well until the holidays but not doing as well since. She would like to lose 20 lb prior to vacation in August. She is up to 3 bottles of water. Wt Readings from Last 3 Encounters:  08/14/18 219 lb (99.3 kg)  09/30/17 223 lb 9.6 oz (101.4 kg)  07/05/17 217 lb (98.4 kg)   Her blood pressure has been controlled at home, today their BP is BP: 122/80  She does workout. She denies chest pain, shortness of breath, dizziness.   She has been prescribed cholesterol medication but is strongly opposed to taking and has not been doing so despite counseling on risks and current guidelines. She denies ever noting myalgias with the medication. She is requesting lifestyle modification with "natural" interventions for her cholesterol,  now taking RYRS.  Her cholesterol is not at goal. The cholesterol last visit was:   Lab Results  Component Value Date   CHOL 217 (H) 09/30/2017   HDL 55 09/30/2017   LDLCALC 130 (H) 09/30/2017   TRIG 188 (H) 09/30/2017   CHOLHDL 3.9  09/30/2017    She has been working on diet for T2 diabetes, she is prescribed metformin 500 mg 2 tabs BID but reports hasn't taken in several months due to brand changing and struggling to swallow new large round tabs- she has not tried cutting in 1/2 - and denies increased appetite, nausea, paresthesia of the feet, polydipsia, polyuria, visual disturbances, vomiting and weight loss. She does check fasting glucose occasionally, reports has been trending up, recently have 1 value that was 300+ and was very concerned. Last A1C in the office was:  Lab Results  Component Value Date   HGBA1C 7.6 (H) 09/30/2017   Patient is on Vitamin D supplement and at goal of 60 at the last check:   Lab Results  Component Value Date   VD25OH 60 09/30/2017        Current Medications:  Current Outpatient Medications on File Prior to Visit  Medication Sig  . acetaminophen (TYLENOL) 500 MG tablet Take 1,000 mg by mouth every 6 (six) hours as needed for mild pain.  . Cholecalciferol (VITAMIN D3) 5000 UNITS TABS Take 1 tablet by mouth daily.   . cimetidine (TAGAMET) 200 MG tablet Take 200 mg by mouth 2 (two) times daily.  . diphenhydrAMINE (BENADRYL) 25 mg capsule Take 25 mg by mouth every 6 (six) hours as needed for itching.  Marland Kitchen glucose blood (CONTOUR NEXT TEST) test strip Check blood sugar 1 time daily-DX-E11.9  . hydrocortisone cream 1 % Apply 1 application topically 3 (three) times daily as needed for itching.  . Omega-3 Fatty Acids (OMEGA 3 PO) Take 1 capsule by mouth 2 (two) times daily.  Marland Kitchen OVER THE COUNTER MEDICATION Takes CoQ10 1 capsule daily  . Red Yeast Rice Extract (RED YEAST RICE PO) Take 1 capsule by mouth daily.  . TURMERIC PO Take 1 tablet by mouth daily. Doesn't take consistently  . metFORMIN (GLUCOPHAGE-XR) 500 MG 24 hr tablet Take 1 tab with breakfast and lunch, 2 tabs with dinner daily for diabetes. (Patient not taking: Reported on 08/14/2018)  . phentermine (ADIPEX-P) 37.5 MG tablet Take 1/2 to  1 tablet every morning for dieting & weightloss   No current facility-administered medications on file prior to visit.      Allergies:  Allergies  Allergen Reactions  . Other     NUTS CAUSE MOUTH TO [place]  . Hydromorphone Rash    Arm reddened at IV med site     Medical History:  Past Medical History:  Diagnosis Date  . Allergy   . Cataracts, bilateral   . Diabetes mellitus without complication ([place])   . History of kidney stones    x2 episodes  . Hyperlipidemia   . PONV (postoperative nausea and vomiting)   . Sleep apnea    test was dx, as mild- no further tx. required  . Type II or unspecified type diabetes mellitus without mention of complication, not stated as uncontrolled    Family history- Reviewed and unchanged Social history- Reviewed and unchanged   Review of Systems:  Review of Systems  Constitutional: Negative for malaise/fatigue and weight loss.  HENT: Negative for congestion, hearing loss, sore throat and tinnitus.   Eyes: Negative for blurred vision  and double vision.  Respiratory: Negative for cough, sputum production, shortness of breath and wheezing.   Cardiovascular: Negative for chest pain, palpitations, orthopnea, claudication and leg swelling.  Gastrointestinal: Negative for abdominal pain, blood in stool, constipation, diarrhea, heartburn, melena, nausea and vomiting.  Genitourinary: Negative.   Musculoskeletal: Negative for joint pain and myalgias.  Skin: Negative for rash.  Neurological: Negative for dizziness, tingling, sensory change, weakness and headaches.  Endo/Heme/Allergies: Negative for environmental allergies and polydipsia.  Psychiatric/Behavioral: Negative for depression and memory loss. The patient is not nervous/anxious and does not have insomnia.   All other systems reviewed and are negative.    Physical Exam: BP 122/80   Pulse 86   Temp 97.7 F (36.5 C)   Ht 5' 2.5" (1.588 m)   Wt 219 lb (99.3 kg)   SpO2 97%   BMI 39.42  kg/m  Wt Readings from Last 3 Encounters:  08/14/18 219 lb (99.3 kg)  09/30/17 223 lb 9.6 oz (101.4 kg)  07/05/17 217 lb (98.4 kg)   General Appearance: Well nourished, in no apparent distress. Eyes: PERRLA, EOMs, conjunctiva no swelling or erythema Sinuses: No Frontal/maxillary tenderness ENT/Mouth: Ext aud canals clear, TMs without erythema, bulging. No erythema, swelling, or exudate on post pharynx.  Tonsils not swollen or erythematous. Hearing normal.  Neck: Supple, thyroid normal.  Respiratory: Respiratory effort normal, BS equal bilaterally without rales, rhonchi, wheezing or stridor.  Cardio: RRR with no MRGs. Brisk peripheral pulses without edema.  Abdomen: Soft, + BS.  Non tender, no guarding, rebound, hernias, masses. Lymphatics: Non tender without lymphadenopathy.  Musculoskeletal: Full ROM, 5/5 strength, Normal gait Skin: Warm, dry without rashes, lesions, ecchymosis.  Neuro: Cranial nerves intact. No cerebellar symptoms.  Psych: Awake and oriented X 3, normal affect, Insight and Judgment appropriate.    Izora Ribas, NP 3:19 PM [facility]

## 2018-08-14 NOTE — Patient Instructions (Signed)
Goals    . HEMOGLOBIN A1C < 7.0    . Weight (lb) < 200 lb (90.7 kg)       Try cutting metformin in 1/2 and swallow a bit at a time  Try swallowing aids - special bottles and throat sprays that can help  Try synjardy - 1 tab twice a day -  Call back in 1 week to let me know if anything helped     Empagliflozin; Metformin extended-release tablets What is this medicine? EMPAGLIFLOZIN; METFORMIN (EM pa gli FLOE zin; met FOR min) is a combination of 2 medicines used to treat type 2 diabetes. This medicine lowers blood sugar. Treatment is combined with a balanced diet and exercise. This medicine may be used for other purposes; ask your health care provider or pharmacist if you have questions. COMMON BRAND NAME(S): Autumn Messing XR What should I tell my health care provider before I take this medicine? They need to know if you have any of these conditions: -anemia -dehydration -diabetic ketoacidosis -diet low in salt -eating less due to illness, surgery, dieting, or any other reason -having surgery -heart disease -high cholesterol -history of pancreatitis or pancreas problems -history of yeast infection of the penis or vagina -if you often drink alcohol -infections in the bladder, kidneys, or urinary tract -kidney disease -liver disease -low blood pressure -polycystic ovary syndrome -problems urinating -serious infection or injury -type 1 diabetes -uncircumcised female -vomiting -an unusual or allergic reaction to empagliflozin, metformin, other medicines, foods, dyes, or preservatives -pregnant or trying to get pregnant -breast-feeding How should I use this medicine? Take this medicine by mouth with a glass of water. Swallow the tablets whole. Do not cut, crush or chew this medicine. Take this medicine with food. Follow the directions on the prescription label. Take your doses at regular intervals. Do not take your medicine more often than directed. Do not stop taking  except on your doctor's advice. A special MedGuide will be given to you by the pharmacist with each prescription and refill. Be sure to read this information carefully each time. Talk to your pediatrician regarding the use of this medicine in children. Special care may be needed. Overdosage: If you think you have taken too much of this medicine contact a poison control center or emergency room at once. NOTE: This medicine is only for you. Do not share this medicine with others. What if I miss a dose? If you miss a dose, take it as soon as you can. If it is almost time for your next dose, take only that dose. Do not take double or extra doses. What may interact with this medicine? Do not take this medicine with any of the following medications: -certain contrast medicines given before X-rays, CT scans, MRI, or other procedures -dofetilide This medicine may also interact with the following medications: -acetazolamide -alcohol -certain antivirals for HIV or hepatitis -certain medicines for blood pressure, heart disease, irregular heart beat -cimetidine -dichlorphenamide -digoxin -diuretics -female hormones, like estrogens or progestins and birth control pills -glycopyrrolate -isoniazid -lamotrigine -memantine -methazolamide -metoclopramide -midodrine -niacin -phenothiazines like chlorpromazine, mesoridazine, prochlorperazine, thioridazine -phenytoin -ranolazine -steroid medicines like prednisone or cortisone -stimulant medicines for attention disorders, weight loss, or to stay awake -thyroid medicines -topiramate -triamterene -trospium -vandetanib -zonisamide This list may not describe all possible interactions. Give your health care provider a list of all the medicines, herbs, non-prescription drugs, or dietary supplements you use. Also tell them if you smoke, drink alcohol, or use illegal drugs. Some items  may interact with your medicine. What should I watch for while using  this medicine? Visit your doctor or health care professional for regular checks on your progress. This medicine can cause a serious condition in which there is too much acid in the blood. If you develop nausea, vomiting, stomach pain, unusual tiredness, or breathing problems, stop taking this medicine and call your doctor right away. If possible, use a ketone dipstick to check for ketones in your urine. A test called the HbA1C (A1C) will be monitored. This is a simple blood test. It measures your blood sugar control over the last 2 to 3 months. You will receive this test every 3 to 6 months. Learn how to check your blood sugar. Learn the symptoms of low and high blood sugar and how to manage them. Always carry a quick-source of sugar with you in case you have symptoms of low blood sugar. Examples include hard sugar candy or glucose tablets. Make sure others know that you can choke if you eat or drink when you develop serious symptoms of low blood sugar, such as seizures or unconsciousness. They must get medical help at once. Tell your doctor or health care professional if you have high blood sugar. You might need to change the dose of your medicine. If you are sick or exercising more than usual, you might need to change the dose of your medicine. Do not skip meals. Ask your doctor or health care professional if you should avoid alcohol. Many nonprescription cough and cold products contain sugar or alcohol. These can affect blood sugar. This medicine may cause ovulation in premenopausal women who do not have regular monthly periods. This may increase your chances of becoming pregnant. You should not take this medicine if you become pregnant or think you may be pregnant. Talk with your doctor or health care professional about your birth control options while taking this medicine. Contact your doctor or health care professional right away if you think you are pregnant. If you are going to need surgery, a MRI,  CT scan, or other procedure, tell your doctor that you are taking this medicine. You may need to stop taking this medicine before the procedure. Wear a medical ID bracelet or chain, and carry a card that describes your disease and details of your medicine and dosage times. This medicine may cause a decrease in folic acid and vitamin B12. You should make sure that you get enough vitamins while you are taking this medicine. Discuss the foods you eat and the vitamins you take with your health care professional. What side effects may I notice from receiving this medicine? Side effects that you should report to your doctor or health care professional as soon as possible: -allergic reactions like skin rash, itching or hives, swelling of the face, lips, or tongue -breathing problems -dizziness -feeling faint or lightheaded, falls -muscle aches or pains -muscle weakness -penile discharge, itching, or pain in men -signs and symptoms of a genital infection, such as fever; tenderness, redness, or swelling in the genitals or area from the genitals to the back of the rectum -signs and symptoms of low blood sugar such as feeling anxious, confusion, dizziness, increased hunger, unusually weak or tired, sweating, shakiness, cold, irritable, headache, blurred vision, fast heartbeat, loss of consciousness -signs and symptoms of a urinary tract infection, such as fever, chills, a burning feeling when urinating, blood in the urine, back pain -trouble passing urine or change in the amount of urine, including an urgent  need to urinate more often, in larger amounts, or at night -slow or irregular heartbeat -unusual stomach pain or discomfort -unusually tired or weak -vaginal discharge, itching, or odor in women -vomiting Side effects that usually do not require medical attention (report to your doctor or health care professional if they continue or are bothersome): -diarrhea -headache -heartburn -metallic taste in  mouth -mild increase in urination -nausea -stomach gas, upset -thirsty This list may not describe all possible side effects. Call your doctor for medical advice about side effects. You may report side effects to FDA at 1-800-FDA-1088. Where should I keep my medicine? Keep out of the reach of children. Store at room temperature between 15 and 30 degrees C (59 and 86 degrees F). Throw away any unused medicine after the expiration date. NOTE: This sheet is a summary. It may not cover all possible information. If you have questions about this medicine, talk to your doctor, pharmacist, or health care provider.  2019 Elsevier/Gold Standard (2017-07-01 09:20:29)

## 2018-08-15 LAB — CBC WITH DIFFERENTIAL/PLATELET
ABSOLUTE MONOCYTES: 737 {cells}/uL (ref 200–950)
BASOS ABS: 39 {cells}/uL (ref 0–200)
BASOS PCT: 0.4 %
EOS ABS: 223 {cells}/uL (ref 15–500)
Eosinophils Relative: 2.3 %
HCT: 40.1 % (ref 35.0–45.0)
Hemoglobin: 14.1 g/dL (ref 11.7–15.5)
Lymphs Abs: 3308 cells/uL (ref 850–3900)
MCH: 30.7 pg (ref 27.0–33.0)
MCHC: 35.2 g/dL (ref 32.0–36.0)
MCV: 87.2 fL (ref 80.0–100.0)
MPV: 10.9 fL (ref 7.5–12.5)
Monocytes Relative: 7.6 %
NEUTROS PCT: 55.6 %
Neutro Abs: 5393 cells/uL (ref 1500–7800)
PLATELETS: 290 10*3/uL (ref 140–400)
RBC: 4.6 10*6/uL (ref 3.80–5.10)
RDW: 13.3 % (ref 11.0–15.0)
Total Lymphocyte: 34.1 %
WBC: 9.7 10*3/uL (ref 3.8–10.8)

## 2018-08-15 LAB — COMPLETE METABOLIC PANEL WITH GFR
AG RATIO: 1.4 (calc) (ref 1.0–2.5)
ALBUMIN MSPROF: 4.1 g/dL (ref 3.6–5.1)
ALKALINE PHOSPHATASE (APISO): 89 U/L (ref 37–153)
ALT: 21 U/L (ref 6–29)
AST: 17 U/L (ref 10–35)
BILIRUBIN TOTAL: 0.4 mg/dL (ref 0.2–1.2)
BUN / CREAT RATIO: 13 (calc) (ref 6–22)
BUN: 14 mg/dL (ref 7–25)
CO2: 28 mmol/L (ref 20–32)
Calcium: 9.5 mg/dL (ref 8.6–10.4)
Chloride: 101 mmol/L (ref 98–110)
Creat: 1.04 mg/dL — ABNORMAL HIGH (ref 0.50–0.99)
GFR, EST AFRICAN AMERICAN: 67 mL/min/{1.73_m2} (ref >=60)
GFR, Est Non African American: 58 mL/min/{1.73_m2} — ABNORMAL LOW (ref >=60)
Globulin: 2.9 g/dL (calc) (ref 1.9–3.7)
Glucose, Bld: 199 mg/dL — ABNORMAL HIGH (ref 65–99)
POTASSIUM: 4.2 mmol/L (ref 3.5–5.3)
SODIUM: 138 mmol/L (ref 135–146)
TOTAL PROTEIN: 7 g/dL (ref 6.1–8.1)

## 2018-08-15 LAB — LIPID PANEL
Cholesterol: 270 mg/dL — ABNORMAL HIGH (ref <200)
HDL: 54 mg/dL (ref >=50)
LDL Cholesterol (Calc): 173 mg/dL (calc) — ABNORMAL HIGH
NON-HDL CHOLESTEROL (CALC): 216 mg/dL — AB (ref <130)
Total CHOL/HDL Ratio: 5 (calc) — ABNORMAL HIGH (ref <5.0)
Triglycerides: 262 mg/dL — ABNORMAL HIGH (ref <150)

## 2018-08-15 LAB — HEMOGLOBIN A1C
Hgb A1c MFr Bld: 10.2 % of total Hgb — ABNORMAL HIGH (ref <5.7)
Mean Plasma Glucose: 246 (calc)
eAG (mmol/L): 13.6 (calc)

## 2018-08-15 LAB — TSH: TSH: 1.14 m[IU]/L (ref 0.40–4.50)

## 2018-08-15 LAB — MAGNESIUM: MAGNESIUM: 1.7 mg/dL (ref 1.5–2.5)

## 2018-08-21 ENCOUNTER — Other Ambulatory Visit: Payer: Self-pay

## 2018-08-21 DIAGNOSIS — E119 Type 2 diabetes mellitus without complications: Secondary | ICD-10-CM

## 2018-08-21 MED ORDER — EMPAGLIFLOZIN-METFORMIN HCL ER 12.5-1000 MG PO TB24: 1.0000 | ORAL_TABLET | Freq: Two times a day (BID) | ORAL | 1 refills | Status: DC

## 2018-08-21 NOTE — Progress Notes (Signed)
Synjardy samples given at last OV. Patient states that this seems to be working and requesting a Rx for this.

## 2018-08-29 ENCOUNTER — Encounter: Payer: Self-pay | Admitting: Physical Therapy

## 2018-08-29 NOTE — Therapy (Signed)
[facility] [address] [place], [place], [address] Phone: [phone]   Fax:  [phone]  Patient Details  Name: Tracy Garrett MRN: [id number] Date of Birth: Aug 21, 1955 Referring Provider:  No ref. provider found  Encounter Date: 08/29/2018  PHYSICAL THERAPY DISCHARGE SUMMARY  Visits from Start of Care: 10 (last visit 09/06/17)  Current functional level related to goals / functional outcomes: PT Long Term Goals - 09/06/17 1538      PT LONG TERM GOAL #1   Title  Pt will be independent with HEP for pain reduction, improved functional mobility, flexibility and strength.  UPDATED T[facility] for LTGs 10/03/17    Baseline  09/06/17: pt is able to vocalize pain management techniques and reports independence with current HEP. She was given information for local gyms and plans to look into one of them.     Time  --   per recert 8/63/81   Period  --    Status  Achieved      PT LONG TERM GOAL #2   Title  Pt will verbalize plans for transition to community fitness upon D/C from PT.    Baseline  09/06/17: pt plans to join either the [employer] or [place] #3   Title  Pt will rate pain in LLE at least 2-3 points lower than 09/02/17 visit (7/10) during functional activities.    Baseline  09/06/17: 4/10 pain reported today after working all day    Time  --    Period  --    Status  Achieved      PT LONG TERM GOAL #4   Title         PT LONG TERM GOAL #5   Title         Pt has met all LTGs.   Remaining deficits: Pain (has improved)   Education / Equipment: Educated in [employer] and community fitness. Plan: Patient agrees to discharge.  Patient goals were met. Patient is being discharged due to meeting the stated rehab goals.  ?????       MARRIOTT,AMY W. 08/29/2018, 8:21 AM Mady Haagensen, PT 08/29/18 8:22 AM Phone: [phone] Fax:  [facility] [address] [place], [place], [address] Phone: [phone]   Fax:  [phone]

## 2018-09-05 ENCOUNTER — Encounter: Payer: Self-pay | Admitting: Adult Health

## 2018-09-13 ENCOUNTER — Other Ambulatory Visit: Payer: Self-pay | Admitting: Adult Health

## 2018-09-13 ENCOUNTER — Other Ambulatory Visit: Payer: Self-pay

## 2018-09-13 ENCOUNTER — Other Ambulatory Visit: Payer: 59

## 2018-09-13 DIAGNOSIS — N182 Chronic kidney disease, stage 2 (mild): Secondary | ICD-10-CM

## 2018-09-14 LAB — BASIC METABOLIC PANEL WITH GFR
BUN: 10 mg/dL (ref 7–25)
CO2: 27 mmol/L (ref 20–32)
Calcium: 9.3 mg/dL (ref 8.6–10.4)
Chloride: 104 mmol/L (ref 98–110)
Creat: 0.83 mg/dL (ref 0.50–0.99)
GFR, Est African American: 88 mL/min/{1.73_m2} (ref >=60)
GFR, Est Non African American: 76 mL/min/{1.73_m2} (ref >=60)
Glucose, Bld: 147 mg/dL — ABNORMAL HIGH (ref 65–99)
Potassium: 4.1 mmol/L (ref 3.5–5.3)
Sodium: 140 mmol/L (ref 135–146)

## 2018-11-24 ENCOUNTER — Encounter: Payer: Self-pay | Admitting: Adult Health

## 2018-12-12 ENCOUNTER — Ambulatory Visit (INDEPENDENT_AMBULATORY_CARE_PROVIDER_SITE_OTHER): Payer: 59 | Admitting: Adult Health Nurse Practitioner

## 2018-12-12 ENCOUNTER — Other Ambulatory Visit: Payer: Self-pay

## 2018-12-12 ENCOUNTER — Encounter: Payer: Self-pay | Admitting: Adult Health Nurse Practitioner

## 2018-12-12 VITALS — BP 132/84 | HR 88 | Temp 97.0°F | Resp 16 | Ht 62.5 in | Wt 221.8 lb

## 2018-12-12 DIAGNOSIS — R5383 Other fatigue: Secondary | ICD-10-CM

## 2018-12-12 DIAGNOSIS — H6503 Acute serous otitis media, bilateral: Secondary | ICD-10-CM

## 2018-12-12 DIAGNOSIS — R591 Generalized enlarged lymph nodes: Secondary | ICD-10-CM | POA: Diagnosis not present

## 2018-12-12 DIAGNOSIS — K219 Gastro-esophageal reflux disease without esophagitis: Secondary | ICD-10-CM

## 2018-12-12 DIAGNOSIS — J301 Allergic rhinitis due to pollen: Secondary | ICD-10-CM | POA: Diagnosis not present

## 2018-12-12 NOTE — Progress Notes (Signed)
Assessment and Plan:  Tracy Garrett was seen today for neck pain.  Lymphadenopathy, discussed possible causes including control of allergy symptoms.   Diagnoses and all orders for this visit:  Lymphadenopathy Continue to monitor symptoms Increase water intake -     CBC with Differential/Platelet -     COMPLETE METABOLIC PANEL WITH GFR -     Epstein-Barr virus VCA antibody panel  Non-recurrent acute serous otitis media of both ears OTC Zyrtec nightly  Consider adding Flonase, fluticasone nasal spray once daily when symptoms increase  Gastroesophageal reflux disease without esophagitis -Take Tagament 200mg  twice a day.  Second dose take 33min-1hour prior to bed. Currently sleeping on three pillows, uncontrolled May also help with allergy symptoms  Seasonal allergic rhinitis due to pollen -OTC Zyrtec (cetirazine 10mg  PO nightly Discussed medication and side effects  Fatigue, unspecified type Will check labs Discussed dietary and exercise modifications   Further disposition pending results of labs. Discussed med's effects and SE's.   Over 30 minutes of exam, counseling, chart review, and critical decision making was performed.   Future Appointments  Date Time Provider [place]  01/04/2019  2:00 PM Liane Comber, NP [facility] None    ------------------------------------------------------------------------------------------------------------------   HPI 63 y.o.female presents for neck pain that started with pressure on the sides of her neck below her ears that started a week ago.  Also having pressure to the back of her neck bilaterally as well as some tightness in the shoulders off and on for years but has been constant for the past week.  She has been doing heating pads and also rotating ice.  She did take naproxen, for her hand, but reports she slept well.  She also reports her ears ar itchy and feel wet inside. Denies any dizziness or ear fullness, popping or cracking.   She reports some nasal congestion, intermittent but clear today.  She has not been taking anything for this but reports she battles with allergies.  She is taking Tagament 200mg , once a day but is prescribed this BID for reflux.  Reports she sleeps on three pillow at night related to throat clearing that wakes her up.  She denies any vision change or itchy eyes, fevers, change in hair or nails.  One month ago she fell over something in the hallway.  Last Friday she went to the urgent care and she was diagnosed with trigger finger and is wearing a brace.  She is scheduling an appointment with orthopedics for this.  Denies any head trauma.    Past Medical History:  Diagnosis Date  . Allergy   . Cataracts, bilateral   . Diabetes mellitus without complication ([place])   . History of kidney stones    x2 episodes  . Hyperlipidemia   . PONV (postoperative nausea and vomiting)   . Sleep apnea    test was dx, as mild- no further tx. required  . Type II or unspecified type diabetes mellitus without mention of complication, not stated as uncontrolled      Allergies  Allergen Reactions  . Other     NUTS CAUSE MOUTH TO [place]  . Hydromorphone Rash    Arm reddened at IV med site    Current Outpatient Medications on File Prior to Visit  Medication Sig  . acetaminophen (TYLENOL) 500 MG tablet Take 1,000 mg by mouth every 6 (six) hours as needed for mild pain.  . Cholecalciferol (VITAMIN D3) 5000 UNITS TABS Take 1 tablet by mouth daily.   . cimetidine (  TAGAMET) 200 MG tablet Take 200 mg by mouth 2 (two) times daily.  . diphenhydrAMINE (BENADRYL) 25 mg capsule Take 25 mg by mouth every 6 (six) hours as needed for itching.  . Empagliflozin-metFORMIN HCl ER (SYNJARDY XR) 12.10-998 MG TB24 Take 1 tablet by mouth 2 (two) times daily.  Marland Kitchen glucose blood (CONTOUR NEXT TEST) test strip Check blood sugar 1 time daily-[id number]  . hydrocortisone cream 1 % Apply 1 application topically 3 (three) times daily as  needed for itching.  . Omega-3 Fatty Acids (OMEGA 3 PO) Take 1 capsule by mouth 2 (two) times daily.  Marland Kitchen OVER THE COUNTER MEDICATION Takes CoQ10 1 capsule daily  . Red Yeast Rice Extract (RED YEAST RICE PO) Take 1 capsule by mouth daily.   No current facility-administered medications on file prior to visit.     ROS: Review of Systems  Constitutional: Positive for malaise/fatigue. Negative for chills, diaphoresis, fever and weight loss.  HENT: Positive for ear pain. Negative for congestion, ear discharge, hearing loss, nosebleeds, sinus pain, sore throat and tinnitus.        Drainage noted from ears, clear.  Sinus drainage back of throat.  Eyes: Negative for blurred vision, double vision, photophobia, pain, discharge and redness.  Respiratory: Negative for cough, hemoptysis, sputum production, shortness of breath, wheezing and stridor.   Cardiovascular: Negative for chest pain, palpitations, orthopnea, claudication, leg swelling and PND.  Gastrointestinal: Positive for heartburn. Negative for abdominal pain, blood in stool, constipation, diarrhea, melena, nausea and vomiting.  Genitourinary: Negative for dysuria, flank pain, frequency, hematuria and urgency.  Musculoskeletal: Positive for neck pain. Negative for back pain, joint pain and myalgias.  Skin: Negative for itching and rash.  Neurological: Negative for dizziness, tingling, tremors, sensory change, speech change, focal weakness, seizures, loss of consciousness, weakness and headaches.  Psychiatric/Behavioral: Negative.    Physical Exam:  BP 132/84   Pulse 88   Temp (!) 97 F (36.1 C)   Resp 16   Ht 5' 2.5" (1.588 m)   Wt 221 lb 12.8 oz (100.6 kg)   BMI 39.92 kg/m   General Appearance: Well nourished, in no apparent distress. Eyes: PERRLA, EOMs, conjunctiva no swelling or erythema Sinuses: No Frontal/maxillary tenderness ENT/Mouth: Ext aud canals clear, TMs without erythema, bulging, serous noted behind bilateral TM's. No  erythema, swelling, or exudate on post pharynx.  Tonsils not swollen or erythematous. Hearing normal.  Neck: Supple, thyroid normal.  Respiratory: Respiratory effort normal, BS equal bilaterally without rales, rhonchi, wheezing or stridor.  Cardio: RRR with no MRGs. Brisk peripheral pulses without edema.  Abdomen: Soft, + BS.  Non tender, no guarding, rebound, hernias, masses. Lymphatics: Lymphadenopathy to submental submandibular, anterior and posterior cervical chain noted, mild tenderness to anterior & posterior chain. Musculoskeletal: Full ROM, 5/5 strength, normal gait.  Skin: Warm, dry without rashes, lesions, ecchymosis.  Neuro: Cranial nerves intact. Normal muscle tone, no cerebellar symptoms. Sensation intact.  Psych: Awake and oriented X 3, normal affect, Insight and Judgment appropriate.     Garnet Sierras, NP 4:00PM [facility]

## 2018-12-12 NOTE — Patient Instructions (Addendum)
  Skip the naproxen for right now for your hand.  Take Ibuprofen 800mg  every 8 hours OR 600mg  every 6 hours.  Please take this with food.  Do this for the next week.  Start taking your Tagamet 200mg  twice a day.  Take the second dose 28min-1hour prior to bed.   Both ears have fluid behind the ear drum.  Start taking Zyrtec 10mg  (Cetirazine) every night for the next 1-2 weeks.   Choose an antihistamine to help dry up nasal drainage.  OR  Xyzal / Levocetirazine  Take 5mg  by mouth May cause drowsiness, take nightly Be sure to drink plenty of water If this is not effective try Allegra OR Zyrtec  OR  Allegra / fexofenadine Take 180mg  by mouth If this is not effective try Zyrtec OR Xyzal   *If you battle with chronic allergies you may need to change the antihistamine you currently use to find most effective.   We are going to check labs today and we will respond though MyChart in 1-3 day.   Please contact us if you have any new or worsening symptoms.

## 2018-12-13 LAB — COMPLETE METABOLIC PANEL WITH GFR
AG Ratio: 1.7 (calc) (ref 1.0–2.5)
ALT: 23 U/L (ref 6–29)
AST: 19 U/L (ref 10–35)
Albumin: 4.5 g/dL (ref 3.6–5.1)
Alkaline phosphatase (APISO): 70 U/L (ref 37–153)
BUN: 17 mg/dL (ref 7–25)
CO2: 23 mmol/L (ref 20–32)
Calcium: 9.4 mg/dL (ref 8.6–10.4)
Chloride: 103 mmol/L (ref 98–110)
Creat: 0.86 mg/dL (ref 0.50–0.99)
GFR, Est African American: 84 mL/min/{1.73_m2} (ref >=60)
GFR, Est Non African American: 72 mL/min/{1.73_m2} (ref >=60)
Globulin: 2.7 g/dL (calc) (ref 1.9–3.7)
Glucose, Bld: 153 mg/dL — ABNORMAL HIGH (ref 65–99)
Potassium: 4.1 mmol/L (ref 3.5–5.3)
Sodium: 138 mmol/L (ref 135–146)
Total Bilirubin: 0.4 mg/dL (ref 0.2–1.2)
Total Protein: 7.2 g/dL (ref 6.1–8.1)

## 2018-12-13 LAB — CBC WITH DIFFERENTIAL/PLATELET
Absolute Monocytes: 752 cells/uL (ref 200–950)
Basophils Absolute: 31 {cells}/uL (ref 0–200)
Basophils Relative: 0.3 %
Eosinophils Absolute: 206 {cells}/uL (ref 15–500)
Eosinophils Relative: 2 %
HCT: 41.9 % (ref 35.0–45.0)
Hemoglobin: 14.5 g/dL (ref 11.7–15.5)
Lymphs Abs: 3409 cells/uL (ref 850–3900)
MCH: 30.7 pg (ref 27.0–33.0)
MCHC: 34.6 g/dL (ref 32.0–36.0)
MCV: 88.8 fL (ref 80.0–100.0)
MPV: 10.4 fL (ref 7.5–12.5)
Monocytes Relative: 7.3 %
Neutro Abs: 5902 cells/uL (ref 1500–7800)
Neutrophils Relative %: 57.3 %
Platelets: 324 10*3/uL (ref 140–400)
RBC: 4.72 10*6/uL (ref 3.80–5.10)
RDW: 14.1 % (ref 11.0–15.0)
Total Lymphocyte: 33.1 %
WBC: 10.3 10*3/uL (ref 3.8–10.8)

## 2018-12-13 LAB — EPSTEIN-BARR VIRUS VCA ANTIBODY PANEL
EBV NA IgG: 354 U/mL — ABNORMAL HIGH
EBV VCA IgG: 115 U/mL — ABNORMAL HIGH
EBV VCA IgM: <36 U/mL

## 2018-12-20 DIAGNOSIS — R591 Generalized enlarged lymph nodes: Secondary | ICD-10-CM

## 2018-12-21 MED ORDER — PREDNISONE 10 MG (21) PO TBPK: ORAL_TABLET | Freq: Every day | ORAL | 0 refills | Status: DC

## 2018-12-21 MED ORDER — SULFAMETHOXAZOLE-TRIMETHOPRIM 800-160 MG PO TABS: 1.0000 | ORAL_TABLET | Freq: Two times a day (BID) | ORAL | 0 refills | Status: DC

## 2018-12-21 NOTE — Telephone Encounter (Signed)
Spoke with patient regarding unresolved symptoms from patient contact.  No new symptoms.  Resolution of drainage down back of throat from antihistamine. Some ear popping and throat discomfort remains.  Cervical lymphadenopathy continues no increase or change.  Discussed ABX treatment to cover staph & strep.  If no improvement in three days with this start taper pred pack.  Finish antibiotics as prescribed.  Discussed mediations and side effects.  Contact office with new or worsening symptoms.   Garnet Sierras, NP [facility] 12/21/2018  9:38 AM

## 2019-01-03 ENCOUNTER — Encounter: Payer: Self-pay | Admitting: Adult Health

## 2019-01-03 DIAGNOSIS — E1122 Type 2 diabetes mellitus with diabetic chronic kidney disease: Secondary | ICD-10-CM | POA: Insufficient documentation

## 2019-01-03 DIAGNOSIS — N181 Chronic kidney disease, stage 1: Secondary | ICD-10-CM | POA: Insufficient documentation

## 2019-01-03 DIAGNOSIS — E1165 Type 2 diabetes mellitus with hyperglycemia: Secondary | ICD-10-CM | POA: Insufficient documentation

## 2019-01-03 NOTE — Progress Notes (Deleted)
Complete Physical  Assessment and Plan:  Diagnoses and all orders for this visit:  Encounter for routine adult health examination with abnormal findings  Hyperlipidemia associated with type 2 diabetes mellitus ([place])  Morbid obesity ([place])  Vitamin D deficiency  Medication management  Benign carcinoid tumor of duodenum  Fatty liver disease, nonalcoholic  Essential hypertension  CKD stage 2 due to type 2 diabetes mellitus ([place])  Type 2 diabetes mellitus with stage 2 chronic kidney disease, without long-term current use of insulin ([place])     Discussed med's effects and SE's. Screening labs and tests as requested with regular follow-up as recommended. Over 40 minutes of exam, counseling, chart review, and complex, high level critical decision making was performed this visit.   Future Appointments  Date Time Provider [place]  01/04/2019  2:00 PM Liane Comber, NP [facility] None  01/09/2020  2:00 PM Liane Comber, NP [facility] None     HPI  63 y.o. female  presents for a complete physical and follow up for has Hyperlipidemia associated with type 2 diabetes mellitus ([place]); Essential hypertension; Medication management; Vitamin D deficiency; Benign carcinoid tumor of duodenum; Fatty liver disease, nonalcoholic; Morbid obesity ([place]); CKD stage 2 due to type 2 diabetes mellitus ([place]); and Poorly controlled type 2 diabetes mellitus ([place]) on their problem list.   She does endorse snoring and ? apnea and never followed through with sleep referral in the past.   Patient also has GERD ***  BMI is There is no height or weight on file to calculate BMI., she {HAS HAS TDV:76160} been working on diet and exercise. Wt Readings from Last 3 Encounters:  12/12/18 221 lb 12.8 oz (100.6 kg)  08/14/18 219 lb (99.3 kg)  09/30/17 223 lb 9.6 oz (101.4 kg)   Her blood pressure {HAS HAS NOT:18834} been controlled at home, today their BP is   She {DOES_DOES VPX:10626} workout. She  denies chest pain, shortness of breath, dizziness.   She is not on cholesterol medication (other than RYRS, patient has been adamantly declining recommended statin or other prescription) and denies myalgias. Her cholesterol is not at goal. The cholesterol last visit was:   Lab Results  Component Value Date   CHOL 270 (H) 08/14/2018   HDL 54 08/14/2018   LDLCALC 173 (H) 08/14/2018   TRIG 262 (H) 08/14/2018   CHOLHDL 5.0 (H) 08/14/2018   She {Has/has not:18111} been working on diet and exercise for T2DM (on synjardy 12.10-998 mg BID), she {ACTION; IS/IS NOT:21021397} on bASA, she {ACTION; IS/IS RSW:[id number]} on ACE/ARB and denies {Symptoms; diabetes w/o none:19199}. Last A1C in the office was:  Lab Results  Component Value Date   HGBA1C 10.2 (H) 08/14/2018   Last GFR: Lab Results  Component Value Date   GFRNONAA 72 12/12/2018   Patient is on Vitamin D supplement.   Lab Results  Component Value Date   VD25OH 60 09/30/2017       Lab Results  Component Value Date   WBC 10.3 12/12/2018   HGB 14.5 12/12/2018   HCT 41.9 12/12/2018   MCV 88.8 12/12/2018   PLT 324 12/12/2018   Lab Results  Component Value Date   KKXFGHWE99 371 02/28/2017   Lab Results  Component Value Date   IRON 72 02/28/2017   TIBC 377 02/28/2017   FERRITIN 91 05/21/2015     Current Medications:  Current Outpatient Medications on File Prior to Visit  Medication Sig Dispense Refill  . acetaminophen (TYLENOL) 500 MG tablet Take 1,000 mg  by mouth every 6 (six) hours as needed for mild pain.    . Cholecalciferol (VITAMIN D3) 5000 UNITS TABS Take 1 tablet by mouth daily.     . cimetidine (TAGAMET) 200 MG tablet Take 200 mg by mouth 2 (two) times daily.    . diphenhydrAMINE (BENADRYL) 25 mg capsule Take 25 mg by mouth every 6 (six) hours as needed for itching.    . Empagliflozin-metFORMIN HCl ER (SYNJARDY XR) 12.10-998 MG TB24 Take 1 tablet by mouth 2 (two) times daily. 180 tablet 1  . glucose blood (CONTOUR  NEXT TEST) test strip Check blood sugar 1 time daily-DX-E11.9 100 each 12  . hydrocortisone cream 1 % Apply 1 application topically 3 (three) times daily as needed for itching.    . Omega-3 Fatty Acids (OMEGA 3 PO) Take 1 capsule by mouth 2 (two) times daily.    Marland Kitchen OVER THE COUNTER MEDICATION Takes CoQ10 1 capsule daily    . predniSONE (STERAPRED UNI-PAK 21 TAB) 10 MG (21) TBPK tablet Take by mouth daily. Follow directions on Taper pack 21 tablet 0  . Red Yeast Rice Extract (RED YEAST RICE PO) Take 1 capsule by mouth daily.    Marland Kitchen sulfamethoxazole-trimethoprim (BACTRIM DS) 800-160 MG tablet Take 1 tablet by mouth 2 (two) times daily. 10 tablet 0   No current facility-administered medications on file prior to visit.    Allergies:  Allergies  Allergen Reactions  . Other     NUTS CAUSE MOUTH TO [place]  . Hydromorphone Rash    Arm reddened at IV med site   Medical History:  She has Hyperlipidemia associated with type 2 diabetes mellitus ([place]); Essential hypertension; Medication management; Vitamin D deficiency; Benign carcinoid tumor of duodenum; Fatty liver disease, nonalcoholic; Morbid obesity ([place]); CKD stage 2 due to type 2 diabetes mellitus ([place]); and Poorly controlled type 2 diabetes mellitus ([place]) on their problem list. Health Maintenance:   Immunization History  Administered Date(s) Administered  . Influenza-Unspecified 03/10/2015  . PPD Test 08/31/2013, 09/02/2014, 02/28/2017  . Tdap 05/05/2009    Tetanus: 2010 *** Pneumovax:  Prevnar 13:  Flu vaccine: Zostavax:  Pap: *** MGM: 04/2018 DEXA: Colonoscopy: *** hx of benign carcinoid tumor of duodenum  EGD: 07/2015  Last Dental Exam: Last Eye Exam: Dr. ***, last 02/17/2018 - report verified and abstracted   Patient Care Team: Unk Pinto, MD as PCP - General (Internal Medicine)  Surgical History:  She has a past surgical history that includes Cholecystectomy (2003); Breast surgery (1999); colonoscopsy (11/2007); Vaginal  hysterectomy (2002); Tonsillectomy; kidney stones; EUS (N/A, 07/26/2014); laparotomy (N/A, 10/10/2014); and Lysis of adhesion (10/10/2014). Family History:  Herfamily history includes Breast cancer in her mother; Cancer in her mother. Social History:  She reports that she has never smoked. She has never used smokeless tobacco. She reports that she does not drink alcohol or use drugs.  Review of Systems: Review of Systems  Constitutional: Negative for malaise/fatigue and weight loss.  HENT: Negative for hearing loss and tinnitus.   Eyes: Negative for blurred vision and double vision.  Respiratory: Negative for cough, sputum production, shortness of breath and wheezing.   Cardiovascular: Negative for chest pain, palpitations, orthopnea, claudication, leg swelling and PND.  Gastrointestinal: Negative for abdominal pain, blood in stool, constipation, diarrhea, heartburn, melena, nausea and vomiting.  Genitourinary: Negative.   Musculoskeletal: Negative for falls, joint pain and myalgias.  Skin: Negative for rash.  Neurological: Negative for dizziness, tingling, sensory change, weakness and headaches.  Endo/Heme/Allergies: Negative for  polydipsia.  Psychiatric/Behavioral: Negative.  Negative for depression, memory loss, substance abuse and suicidal ideas. The patient is not nervous/anxious and does not have insomnia.   All other systems reviewed and are negative.   Physical Exam: Estimated body mass index is 39.92 kg/m as calculated from the following:   Height as of 12/12/18: 5' 2.5" (1.588 m).   Weight as of 12/12/18: 221 lb 12.8 oz (100.6 kg). There were no vitals taken for this visit.   General Appearance: Well nourished, in no apparent distress.  Eyes: PERRLA, EOMs, conjunctiva no swelling or erythema, normal fundi and vessels.  Sinuses: No Frontal/maxillary tenderness  ENT/Mouth: Ext aud canals clear, normal light reflex with TMs without erythema, bulging. Good dentition. No erythema,  swelling, or exudate on post pharynx. Tonsils not swollen or erythematous. Hearing normal.  Neck: Supple, thyroid normal. No bruits  Respiratory: Respiratory effort normal, BS equal bilaterally without rales, rhonchi, wheezing or stridor.  Cardio: RRR without murmurs, rubs or gallops. Brisk peripheral pulses without edema.  Chest: symmetric, with normal excursions and percussion.  Breasts: Symmetric, without lumps, nipple discharge, retractions.  Abdomen: Soft, nontender, no guarding, rebound, hernias, masses, or organomegaly.  Lymphatics: Non tender without lymphadenopathy.  Genitourinary:  Musculoskeletal: Full ROM all peripheral extremities,5/5 strength, and normal gait.  Skin: Warm, dry without rashes, lesions, ecchymosis. Neuro: Cranial nerves intact, reflexes equal bilaterally. Normal muscle tone, no cerebellar symptoms. Sensation intact.  Psych: Awake and oriented X 3, normal affect, Insight and Judgment appropriate.   EKG: WNL no ST changes.  Gorden Harms Lauralynn Loeb 11:12 AM [facility]

## 2019-01-04 ENCOUNTER — Encounter: Payer: 59 | Admitting: Adult Health

## 2019-01-04 DIAGNOSIS — Z0000 Encounter for general adult medical examination without abnormal findings: Secondary | ICD-10-CM

## 2019-01-08 ENCOUNTER — Other Ambulatory Visit: Payer: Self-pay

## 2019-01-08 ENCOUNTER — Encounter: Payer: Self-pay | Admitting: Adult Health

## 2019-01-08 ENCOUNTER — Ambulatory Visit: Payer: 59 | Admitting: Adult Health

## 2019-01-08 VITALS — BP 130/88 | HR 59 | Temp 97.5°F | Ht 62.5 in | Wt 220.0 lb

## 2019-01-08 DIAGNOSIS — E1169 Type 2 diabetes mellitus with other specified complication: Secondary | ICD-10-CM

## 2019-01-08 DIAGNOSIS — N182 Chronic kidney disease, stage 2 (mild): Secondary | ICD-10-CM

## 2019-01-08 DIAGNOSIS — I10 Essential (primary) hypertension: Secondary | ICD-10-CM

## 2019-01-08 DIAGNOSIS — Z8639 Personal history of other endocrine, nutritional and metabolic disease: Secondary | ICD-10-CM

## 2019-01-08 DIAGNOSIS — E1122 Type 2 diabetes mellitus with diabetic chronic kidney disease: Secondary | ICD-10-CM

## 2019-01-08 DIAGNOSIS — Z23 Encounter for immunization: Secondary | ICD-10-CM

## 2019-01-08 DIAGNOSIS — E785 Hyperlipidemia, unspecified: Secondary | ICD-10-CM

## 2019-01-08 DIAGNOSIS — E559 Vitamin D deficiency, unspecified: Secondary | ICD-10-CM

## 2019-01-08 DIAGNOSIS — Z0001 Encounter for general adult medical examination with abnormal findings: Secondary | ICD-10-CM | POA: Diagnosis not present

## 2019-01-08 DIAGNOSIS — K760 Fatty (change of) liver, not elsewhere classified: Secondary | ICD-10-CM

## 2019-01-08 DIAGNOSIS — D649 Anemia, unspecified: Secondary | ICD-10-CM

## 2019-01-08 DIAGNOSIS — D3A010 Benign carcinoid tumor of the duodenum: Secondary | ICD-10-CM

## 2019-01-08 DIAGNOSIS — E1165 Type 2 diabetes mellitus with hyperglycemia: Secondary | ICD-10-CM

## 2019-01-08 DIAGNOSIS — Z79899 Other long term (current) drug therapy: Secondary | ICD-10-CM

## 2019-01-08 MED ORDER — ROSUVASTATIN CALCIUM 5 MG PO TABS: ORAL_TABLET | ORAL | 1 refills | Status: DC

## 2019-01-08 NOTE — Patient Instructions (Addendum)
Tracy Garrett , Thank you for taking time to come for your Wellness Visit. I appreciate your ongoing commitment to your health goals. Please review the following plan we discussed and let me know if I can assist you in the future.   These are the goals we discussed: Goals    . HEMOGLOBIN A1C < 7.0    . LDL CALC < 70    . Weight (lb) < 200 lb (90.7 kg)       This is a list of the screening recommended for you and due dates:  Health Maintenance  Topic Date Due  . Pap Smear  05/05/1977  . Colon Cancer Screening  05/05/2006  . Urine Protein Check  02/28/2018  . Flu Shot  01/06/2019  . Pneumococcal vaccine  01/08/2020*  . Hemoglobin A1C  02/14/2019  . Eye exam for diabetics  02/18/2019  . Tetanus Vaccine  05/06/2019  . Complete foot exam   01/08/2020  . Mammogram  04/14/2020  .  Hepatitis C: One time screening is recommended by Center for Disease Control  (CDC) for  adults born from [birth year] through 1965.   Completed  . HIV Screening  Completed  *Topic was postponed. The date shown is not the original due date.    Suggest adding daily baby aspirin  If starting rosuvastatin for cholesterol, ok to stop fish oil, co Q 10 if pill number is bothersome  Recommend checking fasting glucose daily if possible - goal is <130  Please schedule an appointment with GYN for PAP and to discuss whether you need further   Diabetes Mellitus and Standards of Medical Care Managing diabetes (diabetes mellitus) can be complicated. Your diabetes treatment may be managed by a team of health care providers, including:  A physician who specializes in diabetes (endocrinologist).  A nurse practitioner or physician assistant.  Nurses.  A diet and nutrition specialist (registered dietitian).  A certified diabetes educator (CDE).  An exercise specialist.  A pharmacist.  An eye doctor.  A foot specialist (podiatrist).  A dentist.  A primary care provider.  A mental health provider. Your health  care providers follow guidelines to help you get the best quality of care. The following schedule is a general guideline for your diabetes management plan. Your health care providers may give you more specific instructions. Physical exams Upon being diagnosed with diabetes mellitus, and each year after that, your health care provider will ask about your medical and family history. He or she will also do a physical exam. Your exam may include:  Measuring your height, weight, and body mass index (BMI).  Checking your blood pressure. This will be done at every routine medical visit. Your target blood pressure may vary depending on your medical conditions, your age, and other factors.  Thyroid gland exam.  Skin exam.  Screening for damage to your nerves (peripheral neuropathy). This may include checking the pulse in your legs and feet and checking the level of sensation in your hands and feet.  A complete foot exam to inspect the structure and skin of your feet, including checking for cuts, bruises, redness, blisters, sores, or other problems.  Screening for blood vessel (vascular) problems, which may include checking the pulse in your legs and feet and checking your temperature. Blood tests Depending on your treatment plan and your personal needs, you may have the following tests done:  HbA1c (hemoglobin A1c). This test provides information about blood sugar (glucose) control over the previous 2-3 months. It is  used to adjust your treatment plan, if needed. This test will be done: ? At least 2 times a year, if you are meeting your treatment goals. ? 4 times a year, if you are not meeting your treatment goals or if treatment goals have changed.  Lipid testing, including total, LDL, and HDL cholesterol and triglyceride levels. ? The goal for LDL is less than 100 mg/dL (5.5 mmol/L). If you are at high risk for complications, the goal is less than 70 mg/dL (3.9 mmol/L). ? The goal for HDL is 40  mg/dL (2.2 mmol/L) or higher for men and 50 mg/dL (2.8 mmol/L) or higher for women. An HDL cholesterol of 60 mg/dL (3.3 mmol/L) or higher gives some protection against heart disease. ? The goal for triglycerides is less than 150 mg/dL (8.3 mmol/L).  Liver function tests.  Kidney function tests.  Thyroid function tests. Dental and eye exams  Visit your dentist two times a year.  If you have type 1 diabetes, your health care provider may recommend an eye exam 3-5 years after you are diagnosed, and then once a year after your first exam. ? For children with type 1 diabetes, a health care provider may recommend an eye exam when your child is age 3 or older and has had diabetes for 3-5 years. After the first exam, your child should get an eye exam once a year.  If you have type 2 diabetes, your health care provider may recommend an eye exam as soon as you are diagnosed, and then once a year after your first exam. Immunizations   The yearly flu (influenza) vaccine is recommended for everyone 6 months or older who has diabetes.  The pneumonia (pneumococcal) vaccine is recommended for everyone 2 years or older who has diabetes. If you are 70 or older, you may get the pneumonia vaccine as a series of two separate shots.  The hepatitis B vaccine is recommended for adults shortly after being diagnosed with diabetes.  Adults and children with diabetes should receive all other vaccines according to age-specific recommendations from the Centers for Disease Control and Prevention (CDC). Mental and emotional health Screening for symptoms of eating disorders, anxiety, and depression is recommended at the time of diagnosis and afterward as needed. If your screening shows that you have symptoms (positive screening result), you may need more evaluation and you may work with a mental health care provider. Treatment plan Your treatment plan will be reviewed at every medical visit. You and your health care  provider will discuss:  How you are taking your medicines, including insulin.  Any side effects you are experiencing.  Your blood glucose target goals.  The frequency of your blood glucose monitoring.  Lifestyle habits, such as activity level as well as tobacco, alcohol, and substance use. Diabetes self-management education Your health care provider will assess how well you are monitoring your blood glucose levels and whether you are taking your insulin correctly. He or she may refer you to:  A certified diabetes educator to manage your diabetes throughout your life, starting at diagnosis.  A registered dietitian who can create or review your personal nutrition plan.  An exercise specialist who can discuss your activity level and exercise plan. Summary  Managing diabetes (diabetes mellitus) can be complicated. Your diabetes treatment may be managed by a team of health care providers.  Your health care providers follow guidelines in order to help you get the best quality of care.  Standards of care including having regular  physical exams, blood tests, blood pressure monitoring, immunizations, screening tests, and education about how to manage your diabetes.  Your health care providers may also give you more specific instructions based on your individual health. This information is not intended to replace advice given to you by your health care provider. Make sure you discuss any questions you have with your health care provider. Document Released: 03/21/2009 Document Revised: 02/10/2018 Document Reviewed: 02/20/2016 Elsevier Patient Education  [place].     Rosuvastatin Tablets What is this medicine? ROSUVASTATIN (roe SOO va sta tin) is known as a HMG-CoA reductase inhibitor or 'statin'. It lowers cholesterol and triglycerides in the blood. This drug may also reduce the risk of heart attack, stroke, or other health problems in patients with risk factors for heart  disease. Diet and lifestyle changes are often used with this drug. This medicine may be used for other purposes; ask your health care provider or pharmacist if you have questions. COMMON BRAND NAME(S): Crestor What should I tell my health care provider before I take this medicine? They need to know if you have any of these conditions:  diabetes  if you often drink alcohol  history of stroke  kidney disease  liver disease  muscle aches or weakness  thyroid disease  an unusual or allergic reaction to rosuvastatin, other medicines, foods, dyes, or preservatives  pregnant or trying to get pregnant  breast-feeding How should I use this medicine? Take this medicine by mouth with a glass of water. Follow the directions on the prescription label. Do not cut, crush or chew this medicine. You can take this medicine with or without food. Take your doses at regular intervals. Do not take your medicine more often than directed. Talk to your pediatrician regarding the use of this medicine in children. While this drug may be prescribed for children as young as 33 years old for selected conditions, precautions do apply. Overdosage: If you think you have taken too much of this medicine contact a poison control center or emergency room at once. NOTE: This medicine is only for you. Do not share this medicine with others. What if I miss a dose? If you miss a dose, take it as soon as you can. If your next dose is to be taken in less than 12 hours, then do not take the missed dose. Take the next dose at your regular time. Do not take double or extra doses. What may interact with this medicine? Do not take this medicine with any of the following medications:  herbal medicines like red yeast rice This medicine may also interact with the following medications:  alcohol  antacids containing aluminum hydroxide or magnesium hydroxide  cyclosporine  other medicines for high cholesterol  some  medicines for HIV infection  warfarin This list may not describe all possible interactions. Give your health care provider a list of all the medicines, herbs, non-prescription drugs, or dietary supplements you use. Also tell them if you smoke, drink alcohol, or use illegal drugs. Some items may interact with your medicine. What should I watch for while using this medicine? Visit your doctor or health care professional for regular check-ups. You may need regular tests to make sure your liver is working properly. Your health care professional may tell you to stop taking this medicine if you develop muscle problems. If your muscle problems do not go away after stopping this medicine, contact your health care professional. Do not become pregnant while taking this medicine. Women  should inform their health care professional if they wish to become pregnant or think they might be pregnant. There is a potential for serious side effects to an unborn child. Talk to your health care professional or pharmacist for more information. Do not breast-feed an infant while taking this medicine. This medicine may increase blood sugar. Ask your healthcare provider if changes in diet or medicines are needed if you have diabetes. If you are going to need surgery or other procedure, tell your doctor that you are using this medicine. This drug is only part of a total heart-health program. Your doctor or a dietician can suggest a low-cholesterol and low-fat diet to help. Avoid alcohol and smoking, and keep a proper exercise schedule. This medicine may cause a decrease in Co-Enzyme Q-10. You should make sure that you get enough Co-Enzyme Q-10 while you are taking this medicine. Discuss the foods you eat and the vitamins you take with your health care professional. What side effects may I notice from receiving this medicine? Side effects that you should report to your doctor or health care professional as soon as possible:   allergic reactions like skin rash, itching or hives, swelling of the face, lips, or tongue  confusion  joint pain  loss of memory  redness, blistering, peeling or loosening of the skin, including inside the mouth  signs and symptoms of high blood sugar such as being more thirsty or hungry or having to urinate more than normal. You may also feel very tired or have blurry vision.  signs and symptoms of muscle injury like dark urine; trouble passing urine or change in the amount of urine; unusually weak or tired; muscle pain or side or back pain  yellowing of the eyes or skin Side effects that usually do not require medical attention (report to your doctor or health care professional if they continue or are bothersome):  constipation  diarrhea  dizziness  gas  headache  nausea  stomach pain  trouble sleeping  upset stomach This list may not describe all possible side effects. Call your doctor for medical advice about side effects. You may report side effects to FDA at 1-800-FDA-1088. Where should I keep my medicine? Keep out of the reach of children. Store at room temperature between 20 and 25 degrees C (68 and 77 degrees F). Keep container tightly closed (protect from moisture). Throw away any unused medicine after the expiration date. NOTE: This sheet is a summary. It may not cover all possible information. If you have questions about this medicine, talk to your doctor, pharmacist, or health care provider.  2020 Elsevier/Gold Standard (2018-03-16 08:25:08)

## 2019-01-08 NOTE — Progress Notes (Signed)
Complete Physical  Assessment and Plan:  Tracy Garrett was seen today for annual exam.  Diagnoses and all orders for this visit:  Encounter for general adult medical examination with abnormal findings  Morbid obesity ([name]) Long discussion about weight loss, diet, and exercise Recommended diet heavy in fruits and veggies and low in animal meats, cheeses, and dairy products, appropriate calorie intake Discussed appropriate weight for height  Follow up at next visit  Poorly controlled type 2 diabetes mellitus ([name]) Increase synjardy to BID, pending A1C consider GLP-1 inhibitor - consider 1 month follow up pending lab Education: Reviewed 'ABCs' of diabetes management (respective goals in parentheses):  A1C (<7), blood pressure (<130/80), and cholesterol (LDL <70) Eye Exam yearly and Dental Exam every 6 months. Dietary recommendations Physical Activity recommendations - Strongly advisedto start checking sugars at different times of the day - check 2 times a day, rotating checks - given sugar log and advised how to fill it and to bring it at next appt  -     COMPLETE METABOLIC PANEL WITH GFR -     Hemoglobin A1c -     Microalbumin / creatinine urine ratio -     Urinalysis, Routine w reflex microscopic  Hyperlipidemia associated with type 2 diabetes mellitus ([name]) Patient agreeable to starting low dose rosuvastatin; discussed 5 mg  Continue low cholesterol diet and exercise.  Check lipid panel.  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   Reminder to go to the ER if any CP, SOB, nausea, dizziness, severe HA, changes vision/speech, left arm numbness and tingling and jaw pain. -     COMPLETE METABOLIC PANEL WITH GFR -     Magnesium -     Microalbumin / creatinine urine ratio -     Urinalysis, Routine w reflex microscopic -     EKG 12-Lead  Medication management -     CBC with Differential/Platelet -     COMPLETE METABOLIC PANEL WITH GFR -     Magnesium -     Urinalysis, Routine w reflex microscopic  Vitamin D deficiency -     VITAMIN D 25 Hydroxy (Vit-D Deficiency, Fractures)  Anemia, unspecified type -     CBC with Differential/Platelet -     Vitamin B12 -     Iron,Total/Total Iron Binding Cap  History of non anemic vitamin B12 deficiency -     Vitamin B12   Need for tetanus booster -   Td administered today  Discussed med's effects and SE's. Screening labs and tests as requested with regular follow-up as recommended. Over 40 minutes of exam, counseling, chart review, and complex, high level critical decision making was performed this visit.   No future appointments.   HPI  63 y.o. female  presents for a complete physical and follow up for has Hyperlipidemia associated with type 2 diabetes mellitus (Tracy Garrett); Essential hypertension; Medication management;  Vitamin D deficiency; Benign carcinoid tumor of duodenum; Fatty liver disease, nonalcoholic; Morbid obesity (Tracy Garrett); CKD stage 2 due to type 2 diabetes mellitus (Tracy Garrett); and Poorly controlled type 2 diabetes mellitus (Tracy Garrett) on their problem list.   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   She does endorse snoring and underwent sleep study but per patient this was negative and advised weight loss.   She has left ear pain ongoing for ~ 1 month, intermittent, no changes in hearing, has  appointment with ENT tomorrow to evaluate.   Patient also has GERD controlled with cimetidine.   She has hx of benign carcinoid tumor of duodenum resected in 2016, due for follow up with GI this year  BMI is Body mass index is 39.6 kg/m., she has been working on diet and exercise, has a new stationary bike, getting on 30 min 3 days a week. Admits diet isn't as good as she'd like.  Wt Readings from Last 3 Encounters:  01/08/19 220 lb (99.8 kg)  12/12/18 221 lb 12.8 oz (100.6 kg)  08/14/18 219 lb (99.3 kg)   Her blood pressure has been controlled at home, today their BP is BP: 130/88 She does workout. She denies chest pain, shortness of breath, dizziness.   She is not on cholesterol medication (off of RYRS, was declining statin but agreeable today) and denies myalgias. Her cholesterol is not at goal. The cholesterol last visit was:   Lab Results  Component Value Date   CHOL 270 (H) 08/14/2018   HDL 54 08/14/2018   LDLCALC 173 (H) 08/14/2018   TRIG 262 (H) 08/14/2018   CHOLHDL 5.0 (H) 08/14/2018   She has been working on diet and exercise for T2DM (on synjardy 12.10-998 mg BID, but admits only taking once daily some days), she is not on bASA, she is not on ACE/ARB and denies increased appetite, nausea, paresthesia of the feet, polydipsia, polyuria, visual disturbances and vomiting. She does check sporadic blood sugars, fasting may range 120-150s. Last A1C in the office was:  Lab Results  Component Value Date   HGBA1C 10.2 (H) 08/14/2018   Last GFR: Lab Results  Component Value Date   GFRAA 84 12/12/2018   Patient is on Vitamin D supplement.   Lab Results  Component Value Date   VD25OH 60 09/30/2017        Current Medications:  Current Outpatient Medications on File Prior to Visit  Medication Sig Dispense Refill  . Cholecalciferol (VITAMIN D3) 5000 UNITS TABS Take 1 tablet by mouth daily.     . cimetidine (TAGAMET) 200 MG tablet Take 200 mg by mouth 2 (two) times daily.     . Empagliflozin-metFORMIN HCl ER (SYNJARDY XR) 12.10-998 MG TB24 Take 1 tablet by mouth 2 (two) times daily. 180 tablet 1  . glucose blood (CONTOUR NEXT TEST) test strip Check blood sugar 1 time daily-DX-E11.9 100 each 12  . hydrocortisone cream 1 % Apply 1 application topically 3 (three) times daily as needed for itching.    . Ibuprofen (ADVIL PO) Take by mouth as needed.    . Omega-3 Fatty Acids (OMEGA 3 PO) Take 1 capsule by mouth 2 (two) times daily.    Marland Kitchen OVER THE COUNTER MEDICATION Takes CoQ10 1 capsule daily    . acetaminophen (TYLENOL) 500 MG tablet Take 1,000 mg by mouth every 6 (six) hours as needed for mild pain.    . diphenhydrAMINE (BENADRYL) 25 mg capsule Take  25 mg by mouth every 6 (six) hours as needed for itching.     No current facility-administered medications on file prior to visit.    Allergies:  Allergies  Allergen Reactions  . Other     NUTS CAUSE MOUTH TO [place]  . Hydromorphone Rash    Arm reddened at IV med site   Medical History:  She has Hyperlipidemia associated with type 2 diabetes mellitus ([place]); Essential hypertension; Medication management; Vitamin D deficiency; Benign carcinoid tumor of duodenum; Fatty liver disease, nonalcoholic; Morbid obesity ([place]); CKD stage 2 due to type 2 diabetes mellitus ([place]); and Poorly controlled type 2 diabetes mellitus ([place]) on their problem list. Health Maintenance:   Immunization History  Administered Date(s) Administered  . Influenza-Unspecified 03/10/2015  . PPD Test 08/31/2013, 09/02/2014, 02/28/2017  . Tdap 05/05/2009    Tetanus: 2010 DUE Pneumovax: n/a Prevnar 13: n/a Flu vaccine: gets at work Zostavax: declines  Pap: last remote, s/p hysterectomy, patient plans to schedule MGM: 04/2018 DEXA:  Colonoscopy: 2016, hx of benign carcinoid tumor of duodenum, patient got letter to reschedule EGD: 07/2015  Last Dental Exam: Dr. Kathryne Sharper, goes q38m, will schedule to follow  Last Eye Exam: Dr. Prudencio Burly, last 02/17/2018 -  report verified and abstracted   Patient Care Team: Unk Pinto, MD as PCP - General (Internal Medicine)  Surgical History:  She has a past surgical history that includes Cholecystectomy (2003); Breast surgery (1999); colonoscopsy (11/2007); Vaginal hysterectomy (2002); Tonsillectomy; EUS (N/A, 07/26/2014); laparotomy (N/A, 10/10/2014); Lysis of adhesion (10/10/2014); and Cystoscopy. Family History:  Herfamily history includes Alzheimer's disease in her maternal grandmother; Breast cancer (age of onset: 58) in her mother; CAD (age of onset: 53) in her brother; Cancer in her paternal grandfather; Diabetes type I in her sister; Emphysema in her paternal grandfather; Heart attack in her paternal grandfather; Heart disease in her father. Social History:  She reports that she has never smoked. She has never used smokeless tobacco. She reports that she does not drink alcohol or use drugs.  Review of Systems: Review of Systems  Constitutional: Negative for malaise/fatigue and weight loss.  HENT: Positive for ear pain (intermittent, left, has ENT appt tomorrow). Negative for congestion, ear discharge, hearing loss, sinus pain, sore throat and tinnitus.   Eyes: Negative for blurred vision and double vision.  Respiratory: Negative for cough, sputum production, shortness of breath and wheezing.   Cardiovascular: Negative for chest pain, palpitations, orthopnea, claudication, leg swelling and PND.  Gastrointestinal: Negative for abdominal pain, blood in stool, constipation, diarrhea, heartburn, melena, nausea and vomiting.  Genitourinary: Negative.   Musculoskeletal: Negative for falls, joint pain and myalgias.  Skin: Negative for rash.  Neurological: Negative for dizziness, tingling, sensory change, weakness and headaches.  Endo/Heme/Allergies: Positive for environmental allergies. Negative for polydipsia.  Psychiatric/Behavioral: Negative.  Negative for depression, memory loss, substance abuse and  suicidal ideas. The patient is not nervous/anxious and does not have insomnia.   All other systems reviewed and are negative.   Physical Exam: Estimated body mass index is 39.6 kg/m as calculated from the following:   Height as of this encounter: 5' 2.5" (1.588 m).   Weight as of this encounter: 220 lb (99.8 kg). BP 130/88   Pulse (!) 59   Temp (!) 97.5 F (36.4 C)   Ht 5' 2.5" (1.588 m)   Wt 220 lb (99.8 kg)   SpO2 98%   BMI 39.60 kg/m    General Appearance: Well nourished, in no apparent distress.  Eyes: PERRLA,  EOMs, conjunctiva no swelling or erythema Sinuses: No Frontal/maxillary tenderness  ENT/Mouth: Ext aud canals clear, normal light reflex with TMs without erythema, bulging. Good dentition. No erythema, swelling, or exudate on post pharynx. Tonsils not swollen or erythematous. Hearing normal.  Neck: Supple, thyroid normal. No bruits  Respiratory: Respiratory effort normal, BS equal bilaterally without rales, rhonchi, wheezing or stridor.  Cardio: RRR without murmurs, rubs or gallops. Brisk peripheral pulses without edema.  Chest: symmetric, with normal excursions and percussion.  Breasts: Defer to GYN due to time, pt states will schedule Abdomen: Soft, nontender, no guarding, rebound, hernias, masses, or organomegaly.  Lymphatics: Non tender without lymphadenopathy.  Genitourinary: Defer to GYN  Musculoskeletal: Full ROM all peripheral extremities,5/5 strength, and normal gait.  Skin: Warm, dry without rashes, lesions, ecchymosis. Neuro: Cranial nerves intact, reflexes equal bilaterally. Normal muscle tone, no cerebellar symptoms. Sensation intact.  Psych: Awake and oriented X 3, normal affect, Insight and Judgment appropriate.   EKG: WNL no ST changes.  Izora Ribas 2:48 PM [facility]

## 2019-01-09 ENCOUNTER — Encounter: Payer: Self-pay | Admitting: Adult Health

## 2019-01-09 LAB — LIPID PANEL
Cholesterol: 277 mg/dL — ABNORMAL HIGH (ref <200)
HDL: 55 mg/dL (ref >=50)
LDL Cholesterol (Calc): 167 mg/dL (calc) — ABNORMAL HIGH
Non-HDL Cholesterol (Calc): 222 mg/dL (calc) — ABNORMAL HIGH (ref <130)
Total CHOL/HDL Ratio: 5 (calc) — ABNORMAL HIGH (ref <5.0)
Triglycerides: 322 mg/dL — ABNORMAL HIGH (ref <150)

## 2019-01-09 LAB — URINALYSIS, ROUTINE W REFLEX MICROSCOPIC
Bilirubin Urine: NEGATIVE
Hgb urine dipstick: NEGATIVE
Ketones, ur: NEGATIVE
Leukocytes,Ua: NEGATIVE
Nitrite: NEGATIVE
Protein, ur: NEGATIVE
Specific Gravity, Urine: 1.016 (ref 1.001–1.03)
pH: <=5 (ref 5.0–8.0)

## 2019-01-09 LAB — CBC WITH DIFFERENTIAL/PLATELET
Absolute Monocytes: 1071 cells/uL — ABNORMAL HIGH (ref 200–950)
Basophils Absolute: 24 cells/uL (ref 0–200)
Basophils Relative: 0.2 %
Eosinophils Absolute: 214 cells/uL (ref 15–500)
Eosinophils Relative: 1.8 %
HCT: 42.9 % (ref 35.0–45.0)
Hemoglobin: 14.5 g/dL (ref 11.7–15.5)
Lymphs Abs: 3832 cells/uL (ref 850–3900)
MCH: 29.8 pg (ref 27.0–33.0)
MCHC: 33.8 g/dL (ref 32.0–36.0)
MCV: 88.3 fL (ref 80.0–100.0)
MPV: 10.5 fL (ref 7.5–12.5)
Monocytes Relative: 9 %
Neutro Abs: 6759 cells/uL (ref 1500–7800)
Neutrophils Relative %: 56.8 %
Platelets: 318 10*3/uL (ref 140–400)
RBC: 4.86 10*6/uL (ref 3.80–5.10)
RDW: 14.1 % (ref 11.0–15.0)
Total Lymphocyte: 32.2 %
WBC: 11.9 10*3/uL — ABNORMAL HIGH (ref 3.8–10.8)

## 2019-01-09 LAB — COMPLETE METABOLIC PANEL WITH GFR
AG Ratio: 1.6 (calc) (ref 1.0–2.5)
ALT: 16 U/L (ref 6–29)
AST: 12 U/L (ref 10–35)
Albumin: 4.4 g/dL (ref 3.6–5.1)
Alkaline phosphatase (APISO): 62 U/L (ref 37–153)
BUN: 12 mg/dL (ref 7–25)
CO2: 28 mmol/L (ref 20–32)
Calcium: 9.6 mg/dL (ref 8.6–10.4)
Chloride: 101 mmol/L (ref 98–110)
Creat: 0.74 mg/dL (ref 0.50–0.99)
GFR, Est African American: 101 mL/min/{1.73_m2} (ref >=60)
GFR, Est Non African American: 87 mL/min/{1.73_m2} (ref >=60)
Globulin: 2.8 g/dL (calc) (ref 1.9–3.7)
Glucose, Bld: 110 mg/dL — ABNORMAL HIGH (ref 65–99)
Potassium: 4.2 mmol/L (ref 3.5–5.3)
Sodium: 140 mmol/L (ref 135–146)
Total Bilirubin: 0.5 mg/dL (ref 0.2–1.2)
Total Protein: 7.2 g/dL (ref 6.1–8.1)

## 2019-01-09 LAB — MICROALBUMIN / CREATININE URINE RATIO
Creatinine, Urine: 34 mg/dL (ref 20–275)
Microalb Creat Ratio: 62 mcg/mg creat — ABNORMAL HIGH (ref <30)
Microalb, Ur: 2.1 mg/dL

## 2019-01-09 LAB — IRON, TOTAL/TOTAL IRON BINDING CAP
%SAT: 22 % (calc) (ref 16–45)
Iron: 84 ug/dL (ref 45–160)
TIBC: 379 mcg/dL (calc) (ref 250–450)

## 2019-01-09 LAB — MAGNESIUM: Magnesium: 1.8 mg/dL (ref 1.5–2.5)

## 2019-01-09 LAB — HEMOGLOBIN A1C
Hgb A1c MFr Bld: 7.6 % of total Hgb — ABNORMAL HIGH (ref <5.7)
Mean Plasma Glucose: 171 (calc)
eAG (mmol/L): 9.5 (calc)

## 2019-01-09 LAB — VITAMIN B12: Vitamin B-12: 1555 pg/mL — ABNORMAL HIGH (ref 200–1100)

## 2019-01-09 LAB — TSH: TSH: 1.31 mIU/L (ref 0.40–4.50)

## 2019-01-09 LAB — VITAMIN D 25 HYDROXY (VIT D DEFICIENCY, FRACTURES): Vit D, 25-Hydroxy: 49 ng/mL (ref 30–100)

## 2019-02-21 ENCOUNTER — Other Ambulatory Visit: Payer: Self-pay | Admitting: Adult Health

## 2019-02-21 MED ORDER — FLUCONAZOLE 150 MG PO TABS: 150.0000 mg | ORAL_TABLET | Freq: Once | ORAL | 3 refills | Status: AC

## 2019-03-02 LAB — HM COLONOSCOPY

## 2019-03-27 ENCOUNTER — Encounter: Payer: Self-pay | Admitting: *Deleted

## 2019-03-28 LAB — HM DIABETES EYE EXAM

## 2019-03-29 ENCOUNTER — Encounter: Payer: Self-pay | Admitting: *Deleted

## 2019-03-29 ENCOUNTER — Encounter: Payer: Self-pay | Admitting: Internal Medicine

## 2019-04-11 ENCOUNTER — Ambulatory Visit: Payer: 59 | Admitting: Adult Health

## 2019-04-13 NOTE — Progress Notes (Signed)
FOLLOW UP  Assessment and Plan:   Hypertension Fairly controlled without medications;  Patient has repeatedly been advised recommendation to add ACEi/ARB due to T2DM and renal protective benefits but has declined Monitor blood pressure at home; patient to call if consistently greater than 130/80 Continue DASH diet.   Reminder to go to the ER if any CP, SOB, nausea, dizziness, severe HA, changes vision/speech, left arm numbness and tingling and jaw pain.  Cholesterol Currently above goal; newly trialing rosuvastatin 5 mg and tolerating three times a week Titrate as tolerated for LDL goal <100 initially, then <70 if possible  Continue low cholesterol diet and exercise.  Check lipid panel.   Diabetes with diabetic chronic kidney disease Continue medication: Synjardy XR 12.10-998 mg BID, discussed adding GLP-1 inhibitor if needed; she will consider; information provided Continue diet and exercise, efforts with weight loss Perform daily foot/skin check, notify office of any concerning changes.  Check A1C  CKD 1 associated with T2DM  Increase fluids, avoid NSAIDS, monitor sugars, will monitor  Morbid besity BMI 39 with co morbidities (T2DM, chol, htn) Long discussion about weight loss, diet, and exercise Recommended diet heavy in fruits and veggies and low in animal meats, cheeses, and dairy products, appropriate calorie intake Discussed ideal weight for height  Continue with good dietary choices that she has been following - discussed moderation and ability to maintain long term is important rather than quick weight loss Will follow up in 3 months  Vitamin D Def Near goal at last visit; continue supplementation to maintain goal of 60-100  Defer Vit D level  Candidal rash  Keep area dry, towels under fold at home, apply nystatin powder BID  Follow up if not resolving  Continue diet and meds as discussed. Further disposition pending results of labs. Discussed med's effects and  SE's.   Over 30 minutes of exam, counseling, chart review, and critical decision making was performed.   Future Appointments  Date Time Provider [place]  07/17/2019  9:30 AM Liane Comber, NP [facility] None  01/15/2020 10:00 AM Liane Comber, NP [facility] None    ----------------------------------------------------------------------------------------------------------------------  HPI 63 y.o. female  presents for 3 month follow up on hypertension, cholesterol, diabetes, obesity and vitamin D deficiency.   Today she reports itchy rash R under abdominal fold x 2+ weeks, has been doing topical antibiotic without improvement.  He has hx of mononeuritis and pain through R L hip and knee pain, has done PT in the past which improved sx, having pain again recently, more at night, intermittently feels weak through her ankle with walking, denies lumbar pain and reports restarted taking gabapentin and plans to restart exercises.   She had a repeat sleep study in 06/2017 which was negative for OSA. She was recommended to continue with weight loss.   she has a diagnosis of GERD which is currently managed by pantoprazole 20 mg daily per GI due to silent reflux she reports symptoms is currently well controlled, and denies breakthrough reflux, burning in chest, hoarseness or cough.     BMI is Body mass index is 39.06 kg/m., she has not been working on exercise due to extremity pain and moving away from her walking group. She is working on diet reports she is reducing her carb intake, increasing water intake. She cut down on sweets, bread and red meat intermittently.   Wt Readings from Last 3 Encounters:  04/16/19 217 lb (98.4 kg)  01/08/19 220 lb (99.8 kg)  12/12/18 221 lb 12.8  oz (100.6 kg)   Her blood pressure has been controlled at home, today their BP is BP: 132/88  She does workout. She denies chest pain, shortness of breath, dizziness.   She has been prescribed cholesterol  medication but is strongly opposed to taking, but recently agreed to trial low dose low frequency rosuvastatin 5 mg taking 3 nights a week and tolerating without SE. Her cholesterol is not at goal. The cholesterol last visit was:   Lab Results  Component Value Date   CHOL 277 (H) 01/08/2019   HDL 55 01/08/2019   LDLCALC 167 (H) 01/08/2019   TRIG 322 (H) 01/08/2019   CHOLHDL 5.0 (H) 01/08/2019    She has been working on diet for T2 diabetes, she is prescribed Synjardy XR 12.10-998 mg BID and denies increased appetite, nausea, paresthesia of the feet, polydipsia, polyuria, visual disturbances, vomiting and weight loss. She does check fasting glucose occasionally, reports very irregular, has ranged 120-rarely up to 170. Last A1C in the office was:  Lab Results  Component Value Date   HGBA1C 7.6 (H) 01/08/2019   Lab Results  Component Value Date   GFRAA 101 01/08/2019   Patient is on Vitamin D supplement and approaching goal of 60 at the last check:   Lab Results  Component Value Date   VD25OH 49 01/08/2019        Current Medications:  Current Outpatient Medications on File Prior to Visit  Medication Sig  . acetaminophen (TYLENOL) 500 MG tablet Take 1,000 mg by mouth every 6 (six) hours as needed for mild pain.  . Cholecalciferol (VITAMIN D3) 5000 UNITS TABS Take 1 tablet by mouth daily.   . diphenhydrAMINE (BENADRYL) 25 mg capsule Take 25 mg by mouth every 6 (six) hours as needed for itching.  . Empagliflozin-metFORMIN HCl ER (SYNJARDY XR) 12.10-998 MG TB24 Take 1 tablet by mouth 2 (two) times daily.  Marland Kitchen glucose blood (CONTOUR NEXT TEST) test strip Check blood sugar 1 time daily-DX-E11.9  . hydrocortisone cream 1 % Apply 1 application topically 3 (three) times daily as needed for itching.  . Omega-3 Fatty Acids (OMEGA 3 PO) Take 1 capsule by mouth 2 (two) times daily.  Marland Kitchen OVER THE COUNTER MEDICATION Takes CoQ10 1 capsule daily  . pantoprazole (PROTONIX) 20 MG tablet Take 20 mg by  mouth daily.  . Ibuprofen (ADVIL PO) Take by mouth as needed.  . rosuvastatin (CRESTOR) 5 MG tablet Take 1 tab by mouth at night once a week to start; if tolerating, gradually increase to take 1 tab three days a week. (Patient taking differently: Take one tablet three times a week)   No current facility-administered medications on file prior to visit.      Allergies:  Allergies  Allergen Reactions  . Other     NUTS CAUSE MOUTH TO [place]  . Hydromorphone Rash    Arm reddened at IV med site     Medical History:  Past Medical History:  Diagnosis Date  . Allergy   . Cataracts, bilateral   . History of kidney stones    x2 episodes  . Hyperlipidemia   . PONV (postoperative nausea and vomiting)   . Sleep apnea    test was dx, as mild- no further tx. required  . Type II or unspecified type diabetes mellitus without mention of complication, not stated as uncontrolled    Family history- Reviewed and unchanged Social history- Reviewed and unchanged   Review of Systems:  Review of Systems  Constitutional:  Negative for malaise/fatigue and weight loss.  HENT: Negative for congestion, hearing loss, sore throat and tinnitus.   Eyes: Negative for blurred vision and double vision.  Respiratory: Negative for cough, sputum production, shortness of breath and wheezing.   Cardiovascular: Negative for chest pain, palpitations, orthopnea, claudication and leg swelling.  Gastrointestinal: Negative for abdominal pain, blood in stool, constipation, diarrhea, heartburn, melena, nausea and vomiting.  Genitourinary: Negative.   Musculoskeletal: Negative for joint pain and myalgias.  Skin: Positive for rash (under abdominal fold x 2 weeks).  Neurological: Negative for dizziness, tingling, sensory change, weakness and headaches.  Endo/Heme/Allergies: Negative for environmental allergies and polydipsia.  Psychiatric/Behavioral: Negative for depression and memory loss. The patient is not nervous/anxious  and does not have insomnia.   All other systems reviewed and are negative.    Physical Exam: BP 132/88   Pulse 90   Temp 97.7 F (36.5 C)   Ht 5' 2.5" (1.588 m)   Wt 217 lb (98.4 kg)   SpO2 97%   BMI 39.06 kg/m  Wt Readings from Last 3 Encounters:  04/16/19 217 lb (98.4 kg)  01/08/19 220 lb (99.8 kg)  12/12/18 221 lb 12.8 oz (100.6 kg)   General Appearance: Well nourished, in no apparent distress. Eyes: PERRLA, EOMs, conjunctiva no swelling or erythema Sinuses: No Frontal/maxillary tenderness ENT/Mouth: Ext aud canals clear, TMs without erythema, bulging. No erythema, swelling, or exudate on post pharynx.  Tonsils not swollen or erythematous. Hearing normal.  Neck: Supple, thyroid normal.  Respiratory: Respiratory effort normal, BS equal bilaterally without rales, rhonchi, wheezing or stridor.  Cardio: RRR with no MRGs. Brisk peripheral pulses without edema.  Abdomen: Soft, + BS.  Non tender, no guarding, rebound, hernias, masses. Lymphatics: Non tender without lymphadenopathy.  Musculoskeletal: Full ROM, 5/5 strength, Normal gait, neg straight leg raise Skin: Warm, dry; without lesions, ecchymosis. She has erythematous rash under R sided abdominal fold  Neuro: Cranial nerves intact. No cerebellar symptoms.  Psych: Awake and oriented X 3, normal affect, Insight and Judgment appropriate.    Izora Ribas, NP 2:57 PM [facility]

## 2019-04-16 ENCOUNTER — Other Ambulatory Visit: Payer: Self-pay

## 2019-04-16 ENCOUNTER — Ambulatory Visit: Payer: 59 | Admitting: Adult Health

## 2019-04-16 ENCOUNTER — Encounter: Payer: Self-pay | Admitting: Adult Health

## 2019-04-16 VITALS — BP 132/88 | HR 90 | Temp 97.7°F | Ht 62.5 in | Wt 217.0 lb

## 2019-04-16 DIAGNOSIS — N181 Chronic kidney disease, stage 1: Secondary | ICD-10-CM

## 2019-04-16 DIAGNOSIS — E785 Hyperlipidemia, unspecified: Secondary | ICD-10-CM

## 2019-04-16 DIAGNOSIS — E1165 Type 2 diabetes mellitus with hyperglycemia: Secondary | ICD-10-CM | POA: Diagnosis not present

## 2019-04-16 DIAGNOSIS — G587 Mononeuritis multiplex: Secondary | ICD-10-CM

## 2019-04-16 DIAGNOSIS — I10 Essential (primary) hypertension: Secondary | ICD-10-CM

## 2019-04-16 DIAGNOSIS — E1122 Type 2 diabetes mellitus with diabetic chronic kidney disease: Secondary | ICD-10-CM | POA: Diagnosis not present

## 2019-04-16 DIAGNOSIS — E1169 Type 2 diabetes mellitus with other specified complication: Secondary | ICD-10-CM | POA: Diagnosis not present

## 2019-04-16 DIAGNOSIS — E559 Vitamin D deficiency, unspecified: Secondary | ICD-10-CM

## 2019-04-16 DIAGNOSIS — Z79899 Other long term (current) drug therapy: Secondary | ICD-10-CM

## 2019-04-16 MED ORDER — NYSTATIN 100000 UNIT/GM EX POWD: CUTANEOUS | 2 refills | Status: DC

## 2019-04-16 MED ORDER — GABAPENTIN 300 MG PO CAPS: ORAL_CAPSULE | ORAL | 2 refills | Status: DC

## 2019-04-16 NOTE — Patient Instructions (Addendum)
Goals    . Fasting Blood Glucose <130    . HEMOGLOBIN A1C < 7.0    . LDL CALC < 70    . Weight (lb) < 200 lb (123XX123 kg)         Trulicity, ozempic, bydurion, Bcise, etc  Once a week injectable for diabetes with weight loss benefits    Apply nystatin powder to abdominal fold twice daily as needed  Wash after sweating, try to keep area as dry as possible, can put a towel under fold when at home, etc     Dulaglutide injection What is this medicine? DULAGLUTIDE (DOO la GLOO tide) is used to improve blood sugar control in adults with type 2 diabetes. This medicine may be used with other oral diabetes medicines. This drug may also reduce the risk of heart attack or stroke if you have type 2 diabetes and risk factors for heart disease. This medicine may be used for other purposes; ask your health care provider or pharmacist if you have questions. COMMON BRAND NAME(S): TRULICITY What should I tell my health care provider before I take this medicine? They need to know if you have any of these conditions:  endocrine tumors (MEN 2) or if someone in your family had these tumors  eye disease, vision problems  history of pancreatitis  kidney disease  liver disease  stomach problems  thyroid cancer or if someone in your family had thyroid cancer  an unusual or allergic reaction to dulaglutide, other medicines, foods, dyes, or preservatives  pregnant or trying to get pregnant  breast-feeding How should I use this medicine? This medicine is for injection under the skin of your upper leg (thigh), stomach area, or upper arm. It is usually given once every week (every 7 days). You will be taught how to prepare and give this medicine. Use exactly as directed. Take your medicine at regular intervals. Do not take it more often than directed. If you use this medicine with insulin, you should inject this medicine and the insulin separately. Do not mix them together. Do not give the injections  right next to each other. Change (rotate) injection sites with each injection. It is important that you put your used needles and syringes in a special sharps container. Do not put them in a trash can. If you do not have a sharps container, call your pharmacist or healthcare provider to get one. A special MedGuide will be given to you by the pharmacist with each prescription and refill. Be sure to read this information carefully each time. Talk to your pediatrician regarding the use of this medicine in children. Special care may be needed. Overdosage: If you think you have taken too much of this medicine contact a poison control center or emergency room at once. NOTE: This medicine is only for you. Do not share this medicine with others. What if I miss a dose? If you miss a dose, take it as soon as you can within 3 days after the missed dose. Then take your next dose at your regular weekly time. If it has been longer than 3 days after the missed dose, do not take the missed dose. Take the next dose at your regular time. Do not take double or extra doses. If you have questions about a missed dose, contact your health care provider for advice. What may interact with this medicine?  other medicines for diabetes Many medications may cause changes in blood sugar, these include:  alcohol containing beverages  antiviral medicines for HIV or AIDS  aspirin and aspirin-like drugs  certain medicines for blood pressure, heart disease, irregular heart beat  chromium  diuretics  female hormones, such as estrogens or progestins, birth control pills  fenofibrate  gemfibrozil  isoniazid  lanreotide  female hormones or anabolic steroids  MAOIs like Carbex, Eldepryl, Marplan, Nardil, and Parnate  medicines for weight loss  medicines for allergies, asthma, cold, or cough  medicines for depression, anxiety, or psychotic disturbances  niacin  nicotine  NSAIDs, medicines for pain and  inflammation, like ibuprofen or naproxen  octreotide  pasireotide  pentamidine  phenytoin  probenecid  quinolone antibiotics such as ciprofloxacin, levofloxacin, ofloxacin  some herbal dietary supplements  steroid medicines such as prednisone or cortisone  sulfamethoxazole; trimethoprim  thyroid hormones Some medications can hide the warning symptoms of low blood sugar (hypoglycemia). You may need to monitor your blood sugar more closely if you are taking one of these medications. These include:  beta-blockers, often used for high blood pressure or heart problems (examples include atenolol, metoprolol, propranolol)  clonidine  guanethidine  reserpine This list may not describe all possible interactions. Give your health care provider a list of all the medicines, herbs, non-prescription drugs, or dietary supplements you use. Also tell them if you smoke, drink alcohol, or use illegal drugs. Some items may interact with your medicine. What should I watch for while using this medicine? Visit your doctor or health care professional for regular checks on your progress. Drink plenty of fluids while taking this medicine. Check with your doctor or health care professional if you get an attack of severe diarrhea, nausea, and vomiting. The loss of too much body fluid can make it dangerous for you to take this medicine. A test called the HbA1C (A1C) will be monitored. This is a simple blood test. It measures your blood sugar control over the last 2 to 3 months. You will receive this test every 3 to 6 months. Learn how to check your blood sugar. Learn the symptoms of low and high blood sugar and how to manage them. Always carry a quick-source of sugar with you in case you have symptoms of low blood sugar. Examples include hard sugar candy or glucose tablets. Make sure others know that you can choke if you eat or drink when you develop serious symptoms of low blood sugar, such as seizures or  unconsciousness. They must get medical help at once. Tell your doctor or health care professional if you have high blood sugar. You might need to change the dose of your medicine. If you are sick or exercising more than usual, you might need to change the dose of your medicine. Do not skip meals. Ask your doctor or health care professional if you should avoid alcohol. Many nonprescription cough and cold products contain sugar or alcohol. These can affect blood sugar. Pens should never be shared. Even if the needle is changed, sharing may result in passing of viruses like hepatitis or HIV. Wear a medical ID bracelet or chain, and carry a card that describes your disease and details of your medicine and dosage times. What side effects may I notice from receiving this medicine? Side effects that you should report to your doctor or health care professional as soon as possible:  allergic reactions like skin rash, itching or hives, swelling of the face, lips, or tongue  breathing problems  changes in vision  diarrhea that continues or is severe  lump or swelling on the  neck  severe nausea  signs and symptoms of infection like fever or chills; cough; sore throat; pain or trouble passing urine  signs and symptoms of low blood sugar such as feeling anxious, confusion, dizziness, increased hunger, unusually weak or tired, sweating, shakiness, cold, irritable, headache, blurred vision, fast heartbeat, loss of consciousness  signs and symptoms of kidney injury like trouble passing urine or change in the amount of urine  trouble swallowing  unusual stomach upset or pain  vomiting Side effects that usually do not require medical attention (report to your doctor or health care professional if they continue or are bothersome):  diarrhea  loss of appetite  nausea  pain, redness, or irritation at site where injected  stomach upset This list may not describe all possible side effects. Call  your doctor for medical advice about side effects. You may report side effects to FDA at 1-800-FDA-1088. Where should I keep my medicine? Keep out of the reach of children. Store unopened pens in a refrigerator between 2 and 8 degrees C (36 and 46 degrees F). Do not freeze or use if the medicine has been frozen. Protect from light and excessive heat. Store in the carton until use. Each single-dose pen can be kept at room temperature, not to exceed 30 degrees C (86 degrees F) for a total of 14 days, if needed. Throw away any unused medicine after the expiration date on the label. NOTE: This sheet is a summary. It may not cover all possible information. If you have questions about this medicine, talk to your doctor, pharmacist, or health care provider.  2020 Elsevier/Gold Standard (2018-07-31 15:36:44)

## 2019-04-17 LAB — HEMOGLOBIN A1C
Hgb A1c MFr Bld: 7.5 % of total Hgb — ABNORMAL HIGH (ref <5.7)
Mean Plasma Glucose: 169 (calc)
eAG (mmol/L): 9.3 (calc)

## 2019-04-17 LAB — LIPID PANEL
Cholesterol: 170 mg/dL (ref <200)
HDL: 56 mg/dL (ref >=50)
LDL Cholesterol (Calc): 87 mg/dL (calc)
Non-HDL Cholesterol (Calc): 114 mg/dL (calc) (ref <130)
Total CHOL/HDL Ratio: 3 (calc) (ref <5.0)
Triglycerides: 175 mg/dL — ABNORMAL HIGH (ref <150)

## 2019-04-17 LAB — COMPLETE METABOLIC PANEL WITH GFR
AG Ratio: 1.7 (calc) (ref 1.0–2.5)
ALT: 14 U/L (ref 6–29)
AST: 11 U/L (ref 10–35)
Albumin: 4.3 g/dL (ref 3.6–5.1)
Alkaline phosphatase (APISO): 71 U/L (ref 37–153)
BUN: 11 mg/dL (ref 7–25)
CO2: 27 mmol/L (ref 20–32)
Calcium: 9.4 mg/dL (ref 8.6–10.4)
Chloride: 102 mmol/L (ref 98–110)
Creat: 0.77 mg/dL (ref 0.50–0.99)
GFR, Est African American: 96 mL/min/{1.73_m2} (ref >=60)
GFR, Est Non African American: 83 mL/min/{1.73_m2} (ref >=60)
Globulin: 2.5 g/dL (calc) (ref 1.9–3.7)
Glucose, Bld: 111 mg/dL — ABNORMAL HIGH (ref 65–99)
Potassium: 3.9 mmol/L (ref 3.5–5.3)
Sodium: 140 mmol/L (ref 135–146)
Total Bilirubin: 0.5 mg/dL (ref 0.2–1.2)
Total Protein: 6.8 g/dL (ref 6.1–8.1)

## 2019-04-17 LAB — CBC WITH DIFFERENTIAL/PLATELET
Absolute Monocytes: 924 cells/uL (ref 200–950)
Basophils Absolute: 21 cells/uL (ref 0–200)
Basophils Relative: 0.2 %
Eosinophils Absolute: 294 cells/uL (ref 15–500)
Eosinophils Relative: 2.8 %
HCT: 39.3 % (ref 35.0–45.0)
Hemoglobin: 13.6 g/dL (ref 11.7–15.5)
Lymphs Abs: 3182 cells/uL (ref 850–3900)
MCH: 30.4 pg (ref 27.0–33.0)
MCHC: 34.6 g/dL (ref 32.0–36.0)
MCV: 87.9 fL (ref 80.0–100.0)
MPV: 10.7 fL (ref 7.5–12.5)
Monocytes Relative: 8.8 %
Neutro Abs: 6080 cells/uL (ref 1500–7800)
Neutrophils Relative %: 57.9 %
Platelets: 288 10*3/uL (ref 140–400)
RBC: 4.47 10*6/uL (ref 3.80–5.10)
RDW: 13.1 % (ref 11.0–15.0)
Total Lymphocyte: 30.3 %
WBC: 10.5 10*3/uL (ref 3.8–10.8)

## 2019-04-17 LAB — TSH: TSH: 1.22 mIU/L (ref 0.40–4.50)

## 2019-04-17 LAB — MAGNESIUM: Magnesium: 1.8 mg/dL (ref 1.5–2.5)

## 2019-05-07 ENCOUNTER — Encounter: Payer: Self-pay | Admitting: Orthopaedic Surgery

## 2019-05-07 ENCOUNTER — Other Ambulatory Visit: Payer: Self-pay

## 2019-05-07 ENCOUNTER — Ambulatory Visit: Payer: Self-pay

## 2019-05-07 ENCOUNTER — Ambulatory Visit: Payer: 59 | Admitting: Orthopaedic Surgery

## 2019-05-07 DIAGNOSIS — M7062 Trochanteric bursitis, left hip: Secondary | ICD-10-CM | POA: Diagnosis not present

## 2019-05-07 DIAGNOSIS — M25372 Other instability, left ankle: Secondary | ICD-10-CM

## 2019-05-07 DIAGNOSIS — M79605 Pain in left leg: Secondary | ICD-10-CM

## 2019-05-07 DIAGNOSIS — M25552 Pain in left hip: Secondary | ICD-10-CM

## 2019-05-07 MED ORDER — NABUMETONE 750 MG PO TABS: 750.0000 mg | ORAL_TABLET | Freq: Two times a day (BID) | ORAL | 1 refills | Status: DC | PRN

## 2019-05-07 NOTE — Progress Notes (Signed)
Office Visit Note   Patient: Tracy Garrett           Date of Birth: 10-14-1955           MRN: [id number] Visit Date: 05/07/2019              Requested by: Unk Pinto, [address],  [place] [address] PCP: Unk Pinto, MD   Assessment & Plan: Visit Diagnoses:  1. Pain in left leg   2. Trochanteric bursitis, left hip   3. Left ankle instability     Plan: I am going to have her try both oral Relafen and a topical Voltaren gel.  The Voltaren gel to try on her trochanteric area and IT band.  We will place her in an ASO for her left ankle.  I do feel that she would benefit the most from physical therapy for working on her left hip trochanteric region IT band as well as left ankle strengthening and any modalities that may help decrease any of her radicular symptoms on the lateral aspect of her left leg that may involve strengthening her back and core muscles.  She agrees with this treatment plan.  I will see her back in 4 weeks to see how she is doing after course of outpatient physical therapy as well as oral anti-inflammatories.  Follow-Up Instructions: Return in about 4 weeks (around 06/04/2019).   Orders:  Orders Placed This Encounter  Procedures  . XR Knee 1-2 Views Left  . XR Lumbar Spine 2-3 Views   Meds ordered this encounter  Medications  . nabumetone (RELAFEN) 750 MG tablet    Sig: Take 1 tablet (750 mg total) by mouth 2 (two) times daily as needed.    Dispense:  60 tablet    Refill:  1      Procedures: No procedures performed   Clinical Data: No additional findings.   Subjective: Chief Complaint  Patient presents with  . Left Leg - Pain  . Left Hip - Pain  The patient is a very pleasant 63 year old female who works within the [employer] system.  She comes in with several complaints as a relates to her left side.  She has been having left hip pain of the trochanteric area as well as the IT band.  She also has pain on the  lateral aspect of her left leg.  She does ambulate using a cane.  She does feel as if her left ankle is going to go out on her and is unstable.  She denies any specific back pain.  She is a diabetic and reports not great control.  She is does state that her last hemoglobin A1c was 7.5.  She is not on any blood thinning medications.  She is not taking any anti-inflammatories.  This has been going on for a while but slowly getting worse.  She denies any change in bowel bladder function or any specific weakness.  HPI  Review of Systems She currently denies any headache, chest pain, shortness of breath, fever, chills, nausea, vomiting  Objective: Vital Signs: There were no vitals taken for this visit.  Physical Exam She is alert and orient x3 and in no acute distress Ortho Exam Examination of her left hip shows that it moves fluidly and smoothly with no pain in the groin.  She has pain to palpation of the trochanteric area and IT band on the left side.  She has negative straight leg raise on the left  side.  Her ankle feels stable ligamentously but I feel that she is experiencing some posterior tibial tendon deficiency.  Her knee exam shows just some patellofemoral crepitation but no significant joint line tenderness. Specialty Comments:  No specialty comments available.  Imaging: Xr Knee 1-2 Views Left  Result Date: 05/07/2019 2 views of the left knee show moderate arthritic changes with joint space narrowing and periarticular osteophytes in all 3 compartments.  Xr Lumbar Spine 2-3 Views  Result Date: 05/07/2019 2 views of the lumbar spine show degenerative disc disease at L5-S1.  There is also a grade 1 spondylolisthesis of L4 on L5.    PMFS History: Patient Active Problem List   Diagnosis Date Noted  . CKD stage 1 due to type 2 diabetes mellitus ([place]) 01/03/2019  . Poorly controlled type 2 diabetes mellitus ([place]) 01/03/2019  . Morbid obesity ([place]) 07/04/2017  . Vitamin D deficiency  09/02/2014  . 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 Fatty liver disease, nonalcoholic 0000000  . Essential hypertension 08/31/2013  . Medication management 08/31/2013  . Hyperlipidemia associated with type 2 diabetes mellitus ([place])    Past Medical History:  Diagnosis Date  . Allergy   . Cataracts, bilateral   . History of kidney stones    x2 episodes  . Hyperlipidemia   . PONV (postoperative nausea and vomiting)   . Sleep apnea    test was dx, as mild- no further tx. required  . Type II or unspecified type diabetes mellitus without mention of complication, not stated as uncontrolled     Family History  Problem Relation Age of Onset  . Breast cancer Mother 88  . Heart disease Father        smoker  . Diabetes type I Sister   . Alzheimer's disease Maternal Grandmother   . Emphysema Paternal Grandfather        smoker  . Cancer Paternal Grandfather   . Heart attack Paternal Grandfather   . CAD Brother 4       stents    Past Surgical History:  Procedure Laterality Date  . BREAST SURGERY  1999   Reduction Mammoplasties  . CHOLECYSTECTOMY  2003  . colonoscopsy  11/2007   neg- polyp removal with both procedures x2  . CYSTOSCOPY     surgery to extract stone x 1 with stent  . EUS N/A 07/26/2014   Procedure: UPPER ENDOSCOPIC ULTRASOUND (EUS) LINEAR;  Surgeon: Beryle Beams, MD;  Location: [facility];  Service: Endoscopy;  Laterality: N/A;  . LAPAROTOMY N/A 10/10/2014   Procedure: EXPLORATORY LAPAROTOMY EXCISION OF 21mm DUODENAL CARCINOID TUMOR;  Surgeon: [name], MD;  Location: [facility];  Service: General;  Laterality: N/A;  . LYSIS OF ADHESION  10/10/2014   Procedure: LYSIS OF ADHESION;  Surgeon: [name], MD;  Location: [facility];  Service: General;;  . TONSILLECTOMY     and adenoids removed age 9  . VAGINAL HYSTERECTOMY  2002   Social History   Occupational History  . Not on file  Tobacco Use  . Smoking status: Never Smoker  . Smokeless tobacco:  Never Used  Substance and Sexual Activity  . Alcohol use: No  . Drug use: No  . Sexual activity: Not Currently    Partners: Male    Birth control/protection: Post-menopausal

## 2019-05-09 LAB — HM MAMMOGRAPHY

## 2019-05-21 ENCOUNTER — Encounter: Payer: Self-pay | Admitting: Internal Medicine

## 2019-05-24 ENCOUNTER — Ambulatory Visit: Payer: 59 | Admitting: Physical Therapy

## 2019-06-04 ENCOUNTER — Ambulatory Visit: Payer: 59 | Admitting: Orthopaedic Surgery

## 2019-06-04 ENCOUNTER — Other Ambulatory Visit: Payer: Self-pay | Admitting: Adult Health

## 2019-06-04 ENCOUNTER — Encounter: Payer: Self-pay | Admitting: Internal Medicine

## 2019-06-04 DIAGNOSIS — E119 Type 2 diabetes mellitus without complications: Secondary | ICD-10-CM

## 2019-06-04 NOTE — Progress Notes (Signed)
Virtual Visit via Telephone Note  I connected with Tracy Garrett on 06/05/19 at 10:30 AM EST by telephone and verified that I am speaking with the correct person using two identifiers.  Location: Patient: Home Provider: [place] office    I discussed the limitations, risks, security and privacy concerns of performing an evaluation and management service by telephone and the availability of in person appointments. I also discussed with the patient that there may be a patient responsible charge related to this service. The patient expressed understanding and agreed to proceed.   History of Present Illness:  Temp 98.5 F (36.9 C)   Wt 208 lb (94.3 kg)   BMI 37.44 kg/m   63 y.o. She works part time in Engineer, production for [employer].   She reported to [place] results of positive covid 19 test on 05/20/2019 following close exposure to her husband who tested positive. At that time she denied significant symptoms, reported was managing fairly with OTC medications and declined formal telehealth evaluation. She was given recommendations for home care and encouraged to reach out with significant progression in symptoms or with any needs. She follows up today due to needing paperwork for work completed.  Today she does report she developed cough, sneezing, chillls without fever (but was taking cough syrup with acetaminophen) after our last interaction (managed by OTC agents) which have nearly resolved though reports cough is intermittently persistent, occasionally productive, and significant residual fatigue. She also reports intermittent dizziness. She reports has to rest for extended periods after ADLs such as washing her hair. She is limiting ambulatory activities due to dizziness and fatigue. She reports is slowly improving over the last 2-3 days Denies CP, dyspnea, wheezing, fever/chills. She is planning to return to work next week, tentatively planning on 06/11/2019.   She presents  today for completion of FMLA/ADA paperwork.  Allergies:  Allergies  Allergen Reactions  . Other     NUTS CAUSE MOUTH TO [place]  . Hydromorphone Rash    Arm reddened at IV med site   Medical History:  has Hyperlipidemia associated with type 2 diabetes mellitus ([place]); Essential hypertension; Medication management; Vitamin D deficiency; Benign carcinoid tumor of duodenum; Fatty liver disease, nonalcoholic; Morbid obesity ([place]); CKD stage 1 due to type 2 diabetes mellitus ([place]); and Poorly controlled type 2 diabetes mellitus ([place]) on their problem list. Surgical History:  She  has a past surgical history that includes Cholecystectomy (2003); Breast surgery (1999); colonoscopsy (11/2007); Vaginal hysterectomy (2002); Tonsillectomy; EUS (N/A, 07/26/2014); laparotomy (N/A, 10/10/2014); Lysis of adhesion (10/10/2014); and Cystoscopy. Family History:  Herfamily history includes Alzheimer's disease in her maternal grandmother; Breast cancer (age of onset: 54) in her mother; CAD (age of onset: 22) in her brother; Cancer in her paternal grandfather; Diabetes type I in her sister; Emphysema in her paternal grandfather; Heart attack in her paternal grandfather; Heart disease in her father. Social History:   reports that she has never smoked. She has never used smokeless tobacco. She reports that she does not drink alcohol or use drugs.    Observations/Objective:  General : Well sounding patient in no apparent distress HEENT: no hoarseness, occasional dry sounding cough.  Lungs: speaks in complete sentences, no audible wheezing, no apparent distress Neurological: alert, oriented x 3 Psychiatric: pleasant, judgement appropriate    Assessment and Plan:  Tracy Garrett was seen today for form completion.  Diagnoses and all orders for this visit:  COVID-19 virus infection Postviral fatigue syndrome Forms for work completed; she  endorses ongoing fatigue though improved in last 2-3 days She is optimistic she will  be able to return to previous part time responsibilities next week Will complete forms to return on 06/11/2019. She may follow up with [place] here in office if not improving as anticipated and may obtain imaging or labs at that time.    Follow Up Instructions:    I discussed the assessment and treatment plan with the patient. The patient was provided an opportunity to ask questions and all were answered. The patient agreed with the plan and demonstrated an understanding of the instructions.   The patient was advised to call back or seek an in-person evaluation if the symptoms worsen or if the condition fails to improve as anticipated.  I provided 20 minutes of non-face-to-face time during this encounter.   Izora Ribas, NP   Future Appointments  Date Time Provider [place]  06/21/2019  3:30 PM Debbe Odea, PT [facility] None  07/17/2019  2:30 PM Liane Comber, NP [facility] None  01/15/2020 10:00 AM Liane Comber, NP [facility] None

## 2019-06-05 ENCOUNTER — Other Ambulatory Visit: Payer: Self-pay

## 2019-06-05 ENCOUNTER — Encounter: Payer: Self-pay | Admitting: Adult Health

## 2019-06-05 ENCOUNTER — Ambulatory Visit: Payer: 59 | Admitting: Adult Health

## 2019-06-05 VITALS — Temp 98.5°F | Wt 208.0 lb

## 2019-06-05 DIAGNOSIS — G933 Postviral fatigue syndrome: Secondary | ICD-10-CM

## 2019-06-05 DIAGNOSIS — G9331 Postviral fatigue syndrome: Secondary | ICD-10-CM

## 2019-06-05 DIAGNOSIS — U071 COVID-19: Secondary | ICD-10-CM | POA: Diagnosis not present

## 2019-06-18 ENCOUNTER — Other Ambulatory Visit: Payer: Self-pay | Admitting: Adult Health

## 2019-06-18 MED ORDER — PROMETHAZINE-DM 6.25-15 MG/5ML PO SYRP: 5.0000 mL | ORAL_SOLUTION | Freq: Four times a day (QID) | ORAL | 1 refills | Status: DC | PRN

## 2019-06-21 ENCOUNTER — Ambulatory Visit: Payer: 59 | Admitting: Physical Therapy

## 2019-06-21 ENCOUNTER — Other Ambulatory Visit: Payer: Self-pay

## 2019-06-21 DIAGNOSIS — R2689 Other abnormalities of gait and mobility: Secondary | ICD-10-CM

## 2019-06-21 DIAGNOSIS — M25572 Pain in left ankle and joints of left foot: Secondary | ICD-10-CM | POA: Diagnosis not present

## 2019-06-21 DIAGNOSIS — G8929 Other chronic pain: Secondary | ICD-10-CM

## 2019-06-21 DIAGNOSIS — R600 Localized edema: Secondary | ICD-10-CM

## 2019-06-21 DIAGNOSIS — M545 Low back pain, unspecified: Secondary | ICD-10-CM

## 2019-06-21 NOTE — Patient Instructions (Signed)
Access Code: [email or web address]  URL: [email or web address]  Date: 06/21/2019  Prepared by: Elsie Ra   Exercises  Gastroc Stretch on Wall - 3 sets - 30 hold - 2x daily - 6x weekly  Supine ITB Stretch with Strap - 2-3 reps - 30 hold - 2x daily - 6x weekly  Seated Ankle Inversion with Resistance - 10 reps - 3 sets - 2x daily - 6x weekly  Seated Ankle Dorsiflexion with Anchored Resistance - 10 reps - 3 sets - 2x daily - 6x weekly  Seated Ankle Eversion with Resistance - 10 reps - 3 sets - 2x daily - 6x weekly  Seated Eccentric Ankle Plantar Flexion with Resistance - Straight Leg - 10 reps - 3 sets - 2x daily - 6x weekly  Heel Raises with Counter Support - 10 reps - 3 sets - 2x daily - 6x weekly  Tandem Stance - 10 reps - 3 sets - 2x daily - 6x weekly

## 2019-06-21 NOTE — Addendum Note (Signed)
Addended by: Elsie Ra R on: 06/21/2019 11:58 PM   Modules accepted: Orders

## 2019-06-21 NOTE — Therapy (Signed)
[facility] [address] [place], [place], [address] Phone: [phone]   Fax:  [phone]  Physical Therapy Evaluation  Patient Details  Name: Tracy Garrett MRN: [id number] Date of Birth: 29-Dec-1955 Referring Provider (PT): Kathrynn Speed, MD   Encounter Date: 06/21/2019  PT End of Session - 06/21/19 2344    Visit Number  1    Number of Visits  8    Date for PT Re-Evaluation  08/02/19    Authorization Type  [employer]    PT Start Time  [id number]    PT Stop Time  1619    PT Time Calculation (min)  44 min    Activity Tolerance  Patient tolerated treatment well    Behavior During Therapy  [facility] for tasks assessed/performed       Past Medical History:  Diagnosis Date  . Allergy   . Cataracts, bilateral   . History of kidney stones    x2 episodes  . Hyperlipidemia   . PONV (postoperative nausea and vomiting)   . Sleep apnea    test was dx, as mild- no further tx. required  . Type II or unspecified type diabetes mellitus without mention of complication, not stated as uncontrolled     Past Surgical History:  Procedure Laterality Date  . BREAST SURGERY  1999   Reduction Mammoplasties  . CHOLECYSTECTOMY  2003  . colonoscopsy  11/2007   neg- polyp removal with both procedures x2  . CYSTOSCOPY     surgery to extract stone x 1 with stent  . EUS N/A 07/26/2014   Procedure: UPPER ENDOSCOPIC ULTRASOUND (EUS) LINEAR;  Surgeon: Beryle Beams, MD;  Location: [facility];  Service: Endoscopy;  Laterality: N/A;  . LAPAROTOMY N/A 10/10/2014   Procedure: EXPLORATORY LAPAROTOMY EXCISION OF 10mm DUODENAL CARCINOID TUMOR;  Surgeon: [name], MD;  Location: [facility];  Service: General;  Laterality: N/A;  . LYSIS OF ADHESION  10/10/2014   Procedure: LYSIS OF ADHESION;  Surgeon: [name], MD;  Location: [facility];  Service: General;;  . TONSILLECTOMY     and adenoids removed age 71  . VAGINAL HYSTERECTOMY  2002    There were no vitals filed for this  visit.   Subjective Assessment - 06/21/19 2330    Subjective  She relays she was having back and hip pain but this has mostly resolved. Her biggest complaint today is Lt ankle pain and that it feels like it will give out on her. She did have fall in march but is unsure if the pain started after that or not. She is also unsure what aggravates her back/hip/ankle pain or if anything makes it better because she is mostly sedentary and does not do much to aggravate it. She was given ASO brace for her ankle but says it does not really help. She normally does not walk with AD but has to "baby her left foot at times by limping", she will use SPC if she has to walk a longer distance.    Pertinent History  DM, HTN    Limitations  Lifting;Standing;Walking;House hold activities    How long can you stand comfortably?  maybe 30 min at most    How long can you walk comfortably?  10 min    Diagnostic tests  XR: "2 views of the lumbar spine show degenerative disc disease at L5-S1.   There is also a grade 1 spondylolisthesis of L4 on L5."    Currently in Pain?  Yes  denies back/hip pain today but 8/10 ankle pain   Pain Location  Ankle    Pain Orientation  Left    Pain Descriptors / Indicators  Aching    Pain Type  Chronic pain    Pain Radiating Towards  denies    Pain Onset  More than a month ago    Pain Frequency  Intermittent         [facility] PT Assessment - 06/21/19 0001      Assessment   Medical Diagnosis  Left Hip/IT Band; Back, Ankle    Referring Provider (PT)  C. Ninfa Linden, MD    Onset Date/Surgical Date  --   Chronic since March 2020   Next MD Visit  nothing scheduled    Prior Therapy  nothing recent      Precautions   Precautions  None      Restrictions   Weight Bearing Restrictions  No      Balance Screen   Has the patient fallen in the past 6 months  No   But had fall March 2020     [place] residence      Prior Function   Level of  Independence  Independent      Cognition   Overall Cognitive Status  Within Functional Limits for tasks assessed      Sensation   Light Touch  Appears Intact      Posture/Postural Control   Posture Comments  increased pronation      ROM / Strength   AROM / PROM / Strength  AROM;Strength      AROM   Overall AROM Comments  lumbar and ankle ROM WFL but DF limited about 25%      Strength   Overall Strength Comments  leg strength overall 5/5 except Lt ankle INV/EV 4/5 MMT      Palpation   Spinal mobility  WFL, denies any pain    Palpation comment  TTP in lateral ankle around ATFL, peroneals      Special Tests   Other special tests  neg lumbar special testing including slump test, SLR, quadrant test, FABERs, neg ankle special testing for anterior drawer, talor tilt test, and thump test      Transfers   Transfers  Independent with all Transfers      Ambulation/Gait   Gait Comments  ambulates with slower velocity without AD for limited community distances with antalgic gait on Lt leg with decreased stance time, step length, and increased pronation with foot externally rotated                Objective measurements completed on examination: See above findings.      [facility] Adult PT Treatment/Exercise - 06/21/19 0001      Modalities   Modalities  Moist Heat      Moist Heat Therapy   Number Minutes Moist Heat  10 Minutes    Moist Heat Location  Lumbar Spine;Hip;Ankle             PT Education - 06/21/19 2344    Education Details  HEP, POC    Person(s) Educated  Patient    Methods  Explanation;Demonstration;Verbal cues;Handout    Comprehension  Verbalized understanding;Need further instruction          PT Long Term Goals - 06/21/19 2353      PT LONG TERM GOAL #1   Title  Pt will be independent with HEP for pain reduction, improved functional  mobility, flexibility and strength. (Target for all goals 6 weeks 08/02/19)    Time  6    Status  New      PT  LONG TERM GOAL #2   Title  Pt will improve Lt ankle strength to 5/5 MMT tested in sitting to improve function.    Time  6    Period  Weeks    Status  New      PT LONG TERM GOAL #3   Title  Pt will report overall less than 4/10 pain with usual activity.    Time  6    Period  Weeks    Status  New      PT LONG TERM GOAL #4   Title  Pt will be able to ambulate community distances at least 1000 ft WNL gait pattern without complaints    Time  6    Period  Weeks    Status  New             Plan - 06/21/19 2346    Clinical Impression Statement  Pt presents with chronic lumbar pain, Lt hip pain/IT band syndrome, and Lt ankle pain (likely strain/sprain). Her lumbar/hip pain appears to have resolved and she had no complaints today for her back/hip and special testing negative for her lumbar/hip. Her biggest complaint is Lt ankle pain and weakness with feelings of instabiity limiting her standing and ambulation tolerance. She will benefit from skilled PT to address her deficits.    Personal Factors and Comorbidities  Comorbidity 1    Comorbidities  DM    Examination-Activity Limitations  Stand;Stairs;Squat;Locomotion Level    Examination-Participation Restrictions  Cleaning;Community Activity;Shop    Stability/Clinical Decision Making  Evolving/Moderate complexity    Clinical Decision Making  Moderate    Rehab Potential  Good    PT Frequency  2x / week   1-2   PT Duration  6 weeks    PT Treatment/Interventions  ADLs/Self Care Home Management;Aquatic Therapy;Cryotherapy;Electrical Stimulation;Iontophoresis 4mg /ml Dexamethasone;Moist Heat;Ultrasound;Gait training;Stair training;Therapeutic activities;Therapeutic exercise;Balance training;Neuromuscular re-education;Manual techniques;Passive range of motion;Dry needling;Joint Manipulations;Spinal Manipulations;Vasopneumatic Device;Taping    PT Next Visit Plan  review and update HEP PRN, right now only having ankle pain so focus there if no  back/hip symptoms, needs gait and gentle progression of standing activity    PT Home Exercise Plan  Access Code: [email or web address]    Consulted and Agree with Plan of Care  Patient       Patient will benefit from skilled therapeutic intervention in order to improve the following deficits and impairments:  Abnormal gait, Decreased activity tolerance, Decreased endurance, Decreased strength, Decreased range of motion, Difficulty walking, Postural dysfunction, Pain  Visit Diagnosis: Pain in left ankle and joints of left foot  Chronic left-sided low back pain, unspecified whether sciatica present  Other abnormalities of gait and mobility  Localized edema     Problem List Patient Active Problem List   Diagnosis Date Noted  . CKD stage 1 due to type 2 diabetes mellitus ([place]) 01/03/2019  . Poorly controlled type 2 diabetes mellitus ([place]) 01/03/2019  . Morbid obesity ([place]) 07/04/2017  . Vitamin D deficiency 09/02/2014  . 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 Fatty liver disease, nonalcoholic 0000000  . Essential hypertension 08/31/2013  . Medication management 08/31/2013  . Hyperlipidemia associated with type 2 diabetes mellitus [facility])     Silvestre Mesi 06/21/2019, 11:57 PM  [facility] [address] [place], [place], [address] Phone: [phone]  Fax:  [phone]  Name: Tracy Garrett MRN: [id number] Date of Birth: 11-Nov-1955

## 2019-07-12 ENCOUNTER — Encounter: Payer: 59 | Admitting: Physical Therapy

## 2019-07-16 DIAGNOSIS — K219 Gastro-esophageal reflux disease without esophagitis: Secondary | ICD-10-CM | POA: Insufficient documentation

## 2019-07-16 DIAGNOSIS — Z8616 Personal history of COVID-19: Secondary | ICD-10-CM | POA: Insufficient documentation

## 2019-07-16 NOTE — Progress Notes (Signed)
FOLLOW UP  Assessment and Plan:   Hypertension Patient has repeatedly been advised recommendation to add ACEi/ARB due to T2DM and renal protective benefits, agreeable to initiation today, losartan 50 mg sent in Monitor blood pressure at home; patient to call if consistently greater than 130/80 Continue DASH diet.   Reminder to go to the ER if any CP, SOB, nausea, dizziness, severe HA, changes vision/speech, left arm numbness and tingling and jaw pain.  Cholesterol Currently above goal; newly on rosuvastatin 5 mg and tolerating three times a week Titrate as tolerated for LDL goal <70 if possible  Continue low cholesterol diet and exercise.  Check lipid panel.   Diabetes with diabetic chronic kidney disease Continue medication: Synjardy XR 12.10-998 mg BID, discussed adding GLP-1 inhibitor, she is interested in [employer], discussed, information provided, sample x 1 provided Continue diet and exercise, efforts with weight loss Perform daily foot/skin check, notify office of any concerning changes.  Check A1C  CKD 1 associated with T2DM  Increase fluids, avoid NSAIDS, monitor sugars, will monitor  Morbid besity BMI 38 with co morbidities (T2DM, chol, htn) Long discussion about weight loss, diet, and exercise Recommended diet heavy in fruits and veggies and low in animal meats, cheeses, and dairy products, appropriate calorie intake Discussed ideal weight for height  Continue with good dietary choices that she has been following - discussed moderation and ability to maintain long term is important rather than quick weight loss Will follow up in 3 months  Vitamin D Def Near goal at last visit;  continue supplementation to maintain goal of 60-100  Check Vit D level   Continue diet and meds as discussed. Further disposition pending results of labs. Discussed med's effects and SE's.   Over 30 minutes of exam, counseling, chart review, and critical decision making was performed.    Future Appointments  Date Time Provider [place]  07/19/2019  2:45 PM Laureen Abrahams, PT [facility] None  07/26/2019  2:45 PM Laureen Abrahams, PT [facility] None  08/02/2019  2:45 PM Laureen Abrahams, PT [facility] None  01/15/2020 10:00 AM Liane Comber, NP [facility] None    ----------------------------------------------------------------------------------------------------------------------  HPI 64 y.o. female  presents for 3 month follow up on hypertension, cholesterol, diabetes, obesity and vitamin D deficiency.   Hx of covid 19 in 05/2019, reports has recovered without sequela.   He has hx of mononeuritis and pain through R L hip and knee pain, has done PT in the past which improved sx, will be restarting this week, intermittently feels weak through her ankle with walking, denies lumbar pain and reports restarted taking gabapentin.  She had a repeat sleep study in 06/2017 which was negative for OSA. She was recommended to continue with weight loss.   she has a diagnosis of GERD which is currently managed by pantoprazole 20 mg daily per GI due to silent reflux she reports symptoms is currently well controlled, and denies breakthrough reflux, burning in chest, hoarseness or cough.    BMI is Body mass index is 38.09 kg/m., she has not been working on exercise due to extremity pain. She is working on diet reports she is reducing her carb intake, increasing water intake. She cut down on sweets, bread and red meat intermittently.   Wt Readings from Last 3 Encounters:  07/17/19 211 lb 9.6 oz (96 kg)  06/05/19 208 lb (94.3 kg)  04/16/19 217 lb (98.4 kg)   Her blood pressure has been controlled at home, today their BP  is BP: 136/84  She does not workout. She denies chest pain, shortness of breath, dizziness.   She has been prescribed cholesterol medication but is strongly opposed to taking, but recently agreed to trial low dose low frequency rosuvastatin 5 mg taking 3  nights a week and tolerating without SE. Her cholesterol is not at goal. The cholesterol last visit was:   Lab Results  Component Value Date   CHOL 170 04/16/2019   HDL 56 04/16/2019   LDLCALC 87 04/16/2019   TRIG 175 (H) 04/16/2019   CHOLHDL 3.0 04/16/2019    She has been working on diet for T2 diabetes with CKD 1, she is prescribed Synjardy XR 12.10-998 mg BID and denies increased appetite, nausea, paresthesia of the feet, polydipsia, polyuria, visual disturbances, vomiting and weight loss. She does check fasting glucose occasionally, reports very irregular, up with covid 19 and eating later at night, 120-150s. Last A1C in the office was:  Lab Results  Component Value Date   HGBA1C 7.5 (H) 04/16/2019   She has CKD 1 associated with T2DM monitored at this office:  Lab Results  Component Value Date   GFRAA 96 04/16/2019   Patient is on Vitamin D supplement and approaching goal of 60 at the last check:   Lab Results  Component Value Date   VD25OH 49 01/08/2019         Current Medications:  Current Outpatient Medications on File Prior to Visit  Medication Sig  . acetaminophen (TYLENOL) 500 MG tablet Take 1,000 mg by mouth every 6 (six) hours as needed for mild pain.  . Cholecalciferol (VITAMIN D3) 5000 UNITS TABS Take 1 tablet by mouth daily.   . diphenhydrAMINE (BENADRYL) 25 mg capsule Take 25 mg by mouth every 6 (six) hours as needed for itching.  . Empagliflozin-metFORMIN HCl ER (SYNJARDY XR) 12.10-998 MG TB24 Take 1 tablet by mouth 2 (two) times daily. Take 1 tablet 2 x /day with Meals for Diabetes  . glucose blood (CONTOUR NEXT TEST) test strip Check blood sugar 1 time daily-DX-E11.9  . hydrocortisone cream 1 % Apply 1 application topically 3 (three) times daily as needed for itching.  . Ibuprofen (ADVIL PO) Take by mouth as needed.  . Omega-3 Fatty Acids (OMEGA 3 PO) Take 1 capsule by mouth 2 (two) times daily.  Marland [employer] OVER THE COUNTER MEDICATION Takes CoQ10 1 capsule daily  .  pantoprazole (PROTONIX) 20 MG tablet Take 20 mg by mouth daily.  . promethazine-dextromethorphan (PROMETHAZINE-DM) 6.25-15 MG/5ML syrup Take 5 mLs by mouth 4 (four) times daily as needed for cough.  . rosuvastatin (CRESTOR) 5 MG tablet Take 1 tab by mouth at night once a week to start; if tolerating, gradually increase to take 1 tab three days a week. (Patient taking differently: Take one tablet three times a week)  . gabapentin (NEURONTIN) 300 MG capsule Take 1 tablet 3 x day for pain (Patient not taking: Reported on 06/05/2019)  . nabumetone (RELAFEN) 750 MG tablet Take 1 tablet (750 mg total) by mouth 2 (two) times daily as needed. (Patient not taking: Reported on 06/05/2019)  . nystatin (MYCOSTATIN/NYSTOP) powder Apply twice daily to abdominal fold as needed for rash. (Patient not taking: Reported on 06/05/2019)   No current facility-administered medications on file prior to visit.     Allergies:  Allergies  Allergen Reactions  . Other     NUTS CAUSE MOUTH TO [place]  . Hydromorphone Rash    Arm reddened at IV med site  Medical History:  Past Medical History:  Diagnosis Date  . Allergy   . Cataracts, bilateral   . History of kidney stones    x2 episodes  . Hyperlipidemia   . PONV (postoperative nausea and vomiting)   . Sleep apnea    test was dx, as mild- no further tx. required  . Type II or unspecified type diabetes mellitus without mention of complication, not stated as uncontrolled    Family history- Reviewed and unchanged Social history- Reviewed and unchanged   Review of Systems:  Review of Systems  Constitutional: Negative for malaise/fatigue and weight loss.  HENT: Negative for congestion, hearing loss, sore throat and tinnitus.   Eyes: Negative for blurred vision and double vision.  Respiratory: Negative for cough, sputum production, shortness of breath and wheezing.   Cardiovascular: Negative for chest pain, palpitations, orthopnea, claudication and leg  swelling.  Gastrointestinal: Negative for abdominal pain, blood in stool, constipation, diarrhea, heartburn, melena, nausea and vomiting.  Genitourinary: Negative.   Musculoskeletal: Negative for joint pain and myalgias.  Skin: Negative for rash.  Neurological: Negative for dizziness, tingling, sensory change, weakness and headaches.  Endo/Heme/Allergies: Negative for environmental allergies and polydipsia.  Psychiatric/Behavioral: Negative for depression and memory loss. The patient is not nervous/anxious and does not have insomnia.   All other systems reviewed and are negative.    Physical Exam: BP 136/84   Pulse 87   Temp (!) 97.5 F (36.4 C)   Wt 211 lb 9.6 oz (96 kg)   SpO2 98%   BMI 38.09 kg/m  Wt Readings from Last 3 Encounters:  07/17/19 211 lb 9.6 oz (96 kg)  06/05/19 208 lb (94.3 kg)  04/16/19 217 lb (98.4 kg)   General Appearance: Well nourished, in no apparent distress. Eyes: PERRLA, EOMs, conjunctiva no swelling or erythema Sinuses: No Frontal/maxillary tenderness ENT/Mouth: Ext aud canals clear, TMs without erythema, bulging. No erythema, swelling, or exudate on post pharynx.  Tonsils not swollen or erythematous. Hearing normal.  Neck: Supple, thyroid normal.  Respiratory: Respiratory effort normal, BS equal bilaterally without rales, rhonchi, wheezing or stridor.  Cardio: RRR with no MRGs. Brisk peripheral pulses without edema.  Abdomen: Soft, + BS.  Non tender, no guarding, rebound, hernias, masses. Lymphatics: Non tender without lymphadenopathy.  Musculoskeletal: Full ROM, 5/5 strength, Normal gait Skin: Warm, dry; without lesions, ecchymosis, rash Neuro: Cranial nerves intact. No cerebellar symptoms.  Psych: Awake and oriented X 3, normal affect, Insight and Judgment appropriate.    Izora Ribas, NP 2:52 PM [facility]

## 2019-07-17 ENCOUNTER — Ambulatory Visit: Payer: 59 | Admitting: Adult Health

## 2019-07-17 ENCOUNTER — Other Ambulatory Visit: Payer: Self-pay

## 2019-07-17 ENCOUNTER — Encounter: Payer: Self-pay | Admitting: Adult Health

## 2019-07-17 VITALS — BP 136/84 | HR 87 | Temp 97.5°F | Wt 211.6 lb

## 2019-07-17 DIAGNOSIS — E559 Vitamin D deficiency, unspecified: Secondary | ICD-10-CM

## 2019-07-17 DIAGNOSIS — R8290 Unspecified abnormal findings in urine: Secondary | ICD-10-CM

## 2019-07-17 DIAGNOSIS — Z8616 Personal history of COVID-19: Secondary | ICD-10-CM

## 2019-07-17 DIAGNOSIS — I10 Essential (primary) hypertension: Secondary | ICD-10-CM

## 2019-07-17 DIAGNOSIS — K760 Fatty (change of) liver, not elsewhere classified: Secondary | ICD-10-CM | POA: Diagnosis not present

## 2019-07-17 DIAGNOSIS — E1165 Type 2 diabetes mellitus with hyperglycemia: Secondary | ICD-10-CM | POA: Diagnosis not present

## 2019-07-17 DIAGNOSIS — K219 Gastro-esophageal reflux disease without esophagitis: Secondary | ICD-10-CM

## 2019-07-17 DIAGNOSIS — E1122 Type 2 diabetes mellitus with diabetic chronic kidney disease: Secondary | ICD-10-CM

## 2019-07-17 DIAGNOSIS — E1169 Type 2 diabetes mellitus with other specified complication: Secondary | ICD-10-CM

## 2019-07-17 DIAGNOSIS — N181 Chronic kidney disease, stage 1: Secondary | ICD-10-CM

## 2019-07-17 DIAGNOSIS — Z79899 Other long term (current) drug therapy: Secondary | ICD-10-CM

## 2019-07-17 DIAGNOSIS — E785 Hyperlipidemia, unspecified: Secondary | ICD-10-CM

## 2019-07-17 MED ORDER — LOSARTAN POTASSIUM 50 MG PO TABS: ORAL_TABLET | ORAL | 1 refills | Status: DC

## 2019-07-17 MED ORDER — BYDUREON 2 MG ~~LOC~~ PEN: 2.0000 mg | PEN_INJECTOR | SUBCUTANEOUS | 0 refills | Status: DC

## 2019-07-17 NOTE — Patient Instructions (Addendum)
Goals    . Blood Pressure < 130/80    . Fasting Blood Glucose <130    . HEMOGLOBIN A1C < 7.0    . LDL CALC < 70    . Weight (lb) < 200 lb (90.7 kg)      Inject bydurion Bcise 2 mg into fatty tissue once a week Look up video prior to first injection Look up coupon which should make this medication free   Exenatide injection suspension, extended-release What is this medicine? EXENATIDE (ex EN a tide) is used to improve blood sugar control in adults with type 2 diabetes. This medicine may be used with other oral diabetes medicines. This medicine may be used for other purposes; ask your health care provider or pharmacist if you have questions. COMMON BRAND NAME(S): Bydureon What should I tell my health care provider before I take this medicine? They need to know if you have any of these conditions:  endocrine tumors (MEN 2) or if someone in your family had these tumors  history of pancreatitis  kidney disease or if you are on dialysis  low blood counts, like platelets  stomach or intestine problems  thyroid cancer or if someone in your family had thyroid cancer  an unusual or allergic reaction to exenatide, medicines, foods, dyes, or preservatives  pregnant or trying to get pregnant  breast-feeding How should I use this medicine? This medicine is for injection under the skin of your upper leg, stomach area, or upper arm. It is usually given once every week (every 7 days). You will be taught how to prepare and give this medicine. Use exactly as directed. Take your medicine at regular intervals. Do not take it more often than directed. It is important that you put your used needles and syringes in a special sharps container. Do not put them in a trash can. If you do not have a sharps container, call your pharmacist or healthcare provider to get one. A special MedGuide will be given to you by the pharmacist with each prescription and refill. Be sure to read this information  carefully each time. This drug comes with INSTRUCTIONS FOR USE. Ask your pharmacist for directions on how to use this drug. Read the information carefully. Talk to your pharmacist or health care provider if you have questions. Talk to your pediatrician regarding the use of this medicine in children. Special care may be needed. Overdosage: If you think you have taken too much of this medicine contact a poison control center or emergency room at once. NOTE: This medicine is only for you. Do not share this medicine with others. What if I miss a dose? If you miss a dose, take it as soon as you can, provided your next usual scheduled dose is due at least 3 days later. If you miss a dose and your next usual scheduled dose is due 1 or 2 days later, then do not take the missed dose. Take the next dose at your regular time. Do not take double or extra doses. If you have questions about a missed dose, contact your health care provider for advice. What may interact with this medicine?  acetaminophen  birth control pills  digoxin  insulin and other medicines for diabetes  lisinopril  lovastatin  warfarin Many medications may cause changes in blood sugar, these include:  alcohol containing beverages  antiviral medicines for HIV or AIDS  aspirin and aspirin-like drugs  certain medicines for blood pressure, heart disease, irregular heart beat  chromium  diuretics  female hormones, such as estrogens or progestins, birth control pills  fenofibrate  gemfibrozil  isoniazid  lanreotide  female hormones or anabolic steroids  MAOIs like Carbex, Eldepryl, Marplan, Nardil, and Parnate  medicines for weight loss  medicines for allergies, asthma, cold, or cough  medicines for depression, anxiety, or psychotic disturbances  niacin  nicotine  NSAIDs, medicines for pain and inflammation, like ibuprofen or  naproxen  octreotide  pasireotide  pentamidine  phenytoin  probenecid  quinolone antibiotics such as ciprofloxacin, levofloxacin, ofloxacin  some herbal dietary supplements  steroid medicines such as prednisone or cortisone  sulfamethoxazole; trimethoprim  thyroid hormones Some medications can hide the warning symptoms of low blood sugar (hypoglycemia). You may need to monitor your blood sugar more closely if you are taking one of these medications. These include:  beta-blockers, often used for high blood pressure or heart problems (examples include atenolol, metoprolol, propranolol)  clonidine  guanethidine  reserpine This list may not describe all possible interactions. Give your health care provider a list of all the medicines, herbs, non-prescription drugs, or dietary supplements you use. Also tell them if you smoke, drink alcohol, or use illegal drugs. Some items may interact with your medicine. What should I watch for while using this medicine? Visit your doctor or health care professional for regular checks on your progress. A test called the HbA1C (A1C) will be monitored. This is a simple blood test. It measures your blood sugar control over the last 2 to 3 months. You will receive this test every 3 to 6 months. Learn how to check your blood sugar. Learn the symptoms of low and high blood sugar and how to manage them. Always carry a quick-source of sugar with you in case you have symptoms of low blood sugar. Examples include hard sugar candy or glucose tablets. Make sure others know that you can choke if you eat or drink when you develop serious symptoms of low blood sugar, such as seizures or unconsciousness. They must get medical help at once. Tell your doctor or health care professional if you have high blood sugar. You might need to change the dose of your medicine. If you are sick or exercising more than usual, you might need to change the dose of your medicine. Do  not skip meals. Ask your doctor or health care professional if you should avoid alcohol. Many nonprescription cough and cold products contain sugar or alcohol. These can affect blood sugar. Pens should never be shared. Even if the needle is changed, sharing may result in passing of viruses like hepatitis or HIV. Wear a medical ID bracelet or chain, and carry a card that describes your disease and details of your medicine and dosage times. What side effects may I notice from receiving this medicine? Side effects that you should report to your doctor or health care professional as soon as possible:  allergic reactions like skin rash, itching or hives, swelling of the face, lips, or tongue  breathing problems  diarrhea that continues or is severe  lump or swelling on the neck  severe nausea  signs and symptoms of low blood sugar such as feeling anxious, confusion, dizziness, increased hunger, unusually weak or tired, sweating, shakiness, cold, irritable, headache, blurred vision, fast heartbeat, loss of consciousness  signs and symptoms of kidney injury like trouble passing urine or change in the amount of urine  trouble swallowing  unusual bleeding or bruising  unusual stomach upset or pain  vomiting Side  effects that usually do not require medical attention (report these to your doctor or health care professional if they continue or are bothersome):  constipation  diarrhea  dizziness  headache  nausea  pain, redness, or irritation at site where injected  stomach upset This list may not describe all possible side effects. Call your doctor for medical advice about side effects. You may report side effects to FDA at 1-800-FDA-1088. Where should I keep my medicine? Keep out of the reach of children. Store this medicine in a refrigerator between 2 and 8 degrees C (36 and 46 degrees F). Do not freeze. Do not use if the medicine has been frozen. Protect from light and excessive  heat. Each single-dose tray can be kept at a room temperature not to exceed 25 degrees C (77 degrees F) for no more than a total of 4 weeks, if needed. Throw away any unused medicine after the expiration date on the label. NOTE: This sheet is a summary. It may not cover all possible information. If you have questions about this medicine, talk to your doctor, pharmacist, or health care provider.  2020 Elsevier/Gold Standard (2019-02-06 09:38:21)     Losartan Tablets What is this medicine? LOSARTAN (loe SAR tan) is an angiotensin II receptor blocker, also known as an ARB. It treats high blood pressure. It can slow kidney damage in some patients. It may also be used to lower the risk of stroke. This medicine may be used for other purposes; ask your health care provider or pharmacist if you have questions. COMMON BRAND NAME(S): Cozaar What should I tell my health care provider before I take this medicine? They need to know if you have any of these conditions:  heart failure  kidney or liver disease  an unusual or allergic reaction to losartan, other medicines, foods, dyes, or preservatives  pregnant or trying to get pregnant  breast-feeding How should I use this medicine? Take this drug by mouth. Take it as directed on the prescription label at the same time every day. You can take it with or without food. If it upsets your stomach, take it with food. Keep taking it unless your health care provider tells you to stop. Talk to your health care provider about the use of this drug in children. While it may be prescribed for children as young as 6 for selected conditions, precautions do apply. Overdosage: If you think you have taken too much of this medicine contact a poison control center or emergency room at once. NOTE: This medicine is only for you. Do not share this medicine with others. What if I miss a dose? If you miss a dose, take it as soon as you can. If it is almost time for your  next dose, take only that dose. Do not take double or extra doses. What may interact with this medicine?  blood pressure medicines  diuretics, especially triamterene, spironolactone, or amiloride  fluconazole  NSAIDs, medicines for pain and inflammation, like ibuprofen or naproxen  potassium salts or potassium supplements  rifampin This list may not describe all possible interactions. Give your health care provider a list of all the medicines, herbs, non-prescription drugs, or dietary supplements you use. Also tell them if you smoke, drink alcohol, or use illegal drugs. Some items may interact with your medicine. What should I watch for while using this medicine? Visit your doctor or health care professional for regular checks on your progress. Check your blood pressure as directed. Ask your doctor  or health care professional what your blood pressure should be and when you should contact him or her. Call your doctor or health care professional if you notice an irregular or fast heart beat. Women should inform their doctor if they wish to become pregnant or think they might be pregnant. There is a potential for serious side effects to an unborn child, particularly in the second or third trimester. Talk to your health care professional or pharmacist for more information. You may get drowsy or dizzy. Do not drive, use machinery, or do anything that needs mental alertness until you know how this drug affects you. Do not stand or sit up quickly, especially if you are an older patient. This reduces the risk of dizzy or fainting spells. Alcohol can make you more drowsy and dizzy. Avoid alcoholic drinks. Avoid salt substitutes unless you are told otherwise by your doctor or health care professional. Do not treat yourself for coughs, colds, or pain while you are taking this medicine without asking your doctor or health care professional for advice. Some ingredients may increase your blood pressure. What  side effects may I notice from receiving this medicine? Side effects that you should report to your doctor or health care professional as soon as possible:  confusion, dizziness, light headedness or fainting spells  decreased amount of urine passed  difficulty breathing or swallowing, hoarseness, or tightening of the throat  fast or irregular heart beat, palpitations, or chest pain  skin rash, itching  swelling of your face, lips, tongue, hands, or feet Side effects that usually do not require medical attention (report to your doctor or health care professional if they continue or are bothersome):  cough  decreased sexual function or desire  headache  nasal congestion or stuffiness  nausea or stomach pain  sore or cramping muscles This list may not describe all possible side effects. Call your doctor for medical advice about side effects. You may report side effects to FDA at 1-800-FDA-1088. Where should I keep my medicine? Keep out of the reach of children and pets. Store at room temperature between 15 and 30 degrees C (59 and 86 degrees F). Protect from light. Keep the container tightly closed. Throw away any unused drug after the expiration date. NOTE: This sheet is a summary. It may not cover all possible information. If you have questions about this medicine, talk to your doctor, pharmacist, or health care provider.  2020 Elsevier/Gold Standard (2018-12-27 12:12:28)

## 2019-07-18 ENCOUNTER — Other Ambulatory Visit: Payer: Self-pay | Admitting: Adult Health

## 2019-07-18 DIAGNOSIS — N181 Chronic kidney disease, stage 1: Secondary | ICD-10-CM

## 2019-07-18 DIAGNOSIS — E1122 Type 2 diabetes mellitus with diabetic chronic kidney disease: Secondary | ICD-10-CM

## 2019-07-18 DIAGNOSIS — R809 Proteinuria, unspecified: Secondary | ICD-10-CM | POA: Insufficient documentation

## 2019-07-18 LAB — CBC WITH DIFFERENTIAL/PLATELET
Absolute Monocytes: 719 cells/uL (ref 200–950)
Basophils Absolute: 36 cells/uL (ref 0–200)
Basophils Relative: 0.4 %
Eosinophils Absolute: 282 cells/uL (ref 15–500)
Eosinophils Relative: 3.1 %
HCT: 42.8 % (ref 35.0–45.0)
Hemoglobin: 14.6 g/dL (ref 11.7–15.5)
Lymphs Abs: 2958 cells/uL (ref 850–3900)
MCH: 29.7 pg (ref 27.0–33.0)
MCHC: 34.1 g/dL (ref 32.0–36.0)
MCV: 87 fL (ref 80.0–100.0)
MPV: 10.6 fL (ref 7.5–12.5)
Monocytes Relative: 7.9 %
Neutro Abs: 5105 cells/uL (ref 1500–7800)
Neutrophils Relative %: 56.1 %
Platelets: 308 10*3/uL (ref 140–400)
RBC: 4.92 10*6/uL (ref 3.80–5.10)
RDW: 14.6 % (ref 11.0–15.0)
Total Lymphocyte: 32.5 %
WBC: 9.1 10*3/uL (ref 3.8–10.8)

## 2019-07-18 LAB — COMPLETE METABOLIC PANEL WITH GFR
AG Ratio: 1.6 (calc) (ref 1.0–2.5)
ALT: 16 U/L (ref 6–29)
AST: 13 U/L (ref 10–35)
Albumin: 4.4 g/dL (ref 3.6–5.1)
Alkaline phosphatase (APISO): 72 U/L (ref 37–153)
BUN: 17 mg/dL (ref 7–25)
CO2: 29 mmol/L (ref 20–32)
Calcium: 9.7 mg/dL (ref 8.6–10.4)
Chloride: 102 mmol/L (ref 98–110)
Creat: 0.88 mg/dL (ref 0.50–0.99)
GFR, Est African American: 81 mL/min/{1.73_m2} (ref >=60)
GFR, Est Non African American: 70 mL/min/{1.73_m2} (ref >=60)
Globulin: 2.8 g/dL (calc) (ref 1.9–3.7)
Glucose, Bld: 120 mg/dL — ABNORMAL HIGH (ref 65–99)
Potassium: 4.2 mmol/L (ref 3.5–5.3)
Sodium: 141 mmol/L (ref 135–146)
Total Bilirubin: 0.4 mg/dL (ref 0.2–1.2)
Total Protein: 7.2 g/dL (ref 6.1–8.1)

## 2019-07-18 LAB — URINALYSIS W MICROSCOPIC + REFLEX CULTURE
Bilirubin Urine: NEGATIVE
Hgb urine dipstick: NEGATIVE
Ketones, ur: NEGATIVE
Leukocyte Esterase: NEGATIVE
Nitrites, Initial: NEGATIVE
RBC / HPF: NONE SEEN /HPF (ref 0–2)
Specific Gravity, Urine: 1.022 (ref 1.001–1.03)
pH: 5.5 (ref 5.0–8.0)

## 2019-07-18 LAB — LIPID PANEL
Cholesterol: 227 mg/dL — ABNORMAL HIGH (ref <200)
HDL: 69 mg/dL (ref >=50)
LDL Cholesterol (Calc): 127 mg/dL (calc) — ABNORMAL HIGH
Non-HDL Cholesterol (Calc): 158 mg/dL (calc) — ABNORMAL HIGH (ref <130)
Total CHOL/HDL Ratio: 3.3 (calc) (ref <5.0)
Triglycerides: 190 mg/dL — ABNORMAL HIGH (ref <150)

## 2019-07-18 LAB — HEMOGLOBIN A1C
Hgb A1c MFr Bld: 7.3 % of total Hgb — ABNORMAL HIGH (ref <5.7)
Mean Plasma Glucose: 163 (calc)
eAG (mmol/L): 9 (calc)

## 2019-07-18 LAB — NO CULTURE INDICATED

## 2019-07-18 LAB — MAGNESIUM: Magnesium: 2.1 mg/dL (ref 1.5–2.5)

## 2019-07-18 LAB — TSH: TSH: 1.31 mIU/L (ref 0.40–4.50)

## 2019-07-18 MED ORDER — ROSUVASTATIN CALCIUM 5 MG PO TABS: ORAL_TABLET | ORAL | 1 refills | Status: DC

## 2019-07-19 ENCOUNTER — Ambulatory Visit: Payer: 59 | Admitting: Physical Therapy

## 2019-07-19 ENCOUNTER — Other Ambulatory Visit: Payer: Self-pay

## 2019-07-19 ENCOUNTER — Encounter: Payer: Self-pay | Admitting: Physical Therapy

## 2019-07-19 DIAGNOSIS — M25572 Pain in left ankle and joints of left foot: Secondary | ICD-10-CM | POA: Diagnosis not present

## 2019-07-19 DIAGNOSIS — M545 Low back pain, unspecified: Secondary | ICD-10-CM

## 2019-07-19 DIAGNOSIS — R600 Localized edema: Secondary | ICD-10-CM | POA: Diagnosis not present

## 2019-07-19 DIAGNOSIS — G8929 Other chronic pain: Secondary | ICD-10-CM

## 2019-07-19 DIAGNOSIS — R2689 Other abnormalities of gait and mobility: Secondary | ICD-10-CM | POA: Diagnosis not present

## 2019-07-19 DIAGNOSIS — M79662 Pain in left lower leg: Secondary | ICD-10-CM

## 2019-07-19 DIAGNOSIS — R29898 Other symptoms and signs involving the musculoskeletal system: Secondary | ICD-10-CM

## 2019-07-19 DIAGNOSIS — R262 Difficulty in walking, not elsewhere classified: Secondary | ICD-10-CM

## 2019-07-19 NOTE — Therapy (Signed)
[facility] [address] [place], [place], [address] Phone: [phone]   Fax:  [phone]  Physical Therapy Treatment  Patient Details  Name: Tracy Garrett MRN: [id number] Date of Birth: July 04, 1955 Referring Provider (PT): Kathrynn Speed, MD   Encounter Date: 07/19/2019  PT End of Session - 07/19/19 1527    Visit Number  2    Number of Visits  8    Date for PT Re-Evaluation  08/02/19    Authorization Type  [employer]    PT Start Time  8270    PT Stop Time  1521    PT Time Calculation (min)  39 min    Activity Tolerance  Patient tolerated treatment well    Behavior During Therapy  [facility] for tasks assessed/performed       Past Medical History:  Diagnosis Date  . Allergy   . Cataracts, bilateral   . History of kidney stones    x2 episodes  . Hyperlipidemia   . PONV (postoperative nausea and vomiting)   . Sleep apnea    test was dx, as mild- no further tx. required  . Type II or unspecified type diabetes mellitus without mention of complication, not stated as uncontrolled     Past Surgical History:  Procedure Laterality Date  . BREAST SURGERY  1999   Reduction Mammoplasties  . CHOLECYSTECTOMY  2003  . colonoscopsy  11/2007   neg- polyp removal with both procedures x2  . CYSTOSCOPY     surgery to extract stone x 1 with stent  . EUS N/A 07/26/2014   Procedure: UPPER ENDOSCOPIC ULTRASOUND (EUS) LINEAR;  Surgeon: Beryle Beams, MD;  Location: [facility] ENDOSCOPY;  Service: Endoscopy;  Laterality: N/A;  . 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 LYSIS OF ADHESION  10/10/2014   Procedure: LYSIS OF ADHESION;  Surgeon: TJackolyn Confer MD;  Location: [facility];  Service: General;;  . TONSILLECTOMY     and adenoids removed age 64 . VAGINAL HYSTERECTOMY  2002    There were no vitals filed for this  visit.  Subjective Assessment - 07/19/19 1443    Subjective  trying to do exercises and feels it flared up her back and hip    Pertinent History  DM, HTN    Limitations  Lifting;Standing;Walking;House hold activities    How long can you stand comfortably?  maybe 30 min at most    How long can you walk comfortably?  10 min    Diagnostic tests  XR: "2 views of the lumbar spine show degenerative disc disease at L5-S1.   There is also a grade 1 spondylolisthesis of L4 on L5."    Patient Stated Goals  improve pain    Currently in Pain?  Yes    Pain Score  5     Pain Location  Hip    Pain Orientation  Left    Pain Type  Chronic pain    Pain Radiating Towards  LLE to ankle    Pain Onset  More than a month ago    Pain Frequency  Intermittent    Aggravating Factors   exercises                       [facility] Adult PT Treatment/Exercise - 07/19/19 1446      Exercises   Exercises  Knee/Hip;Ankle      Knee/Hip Exercises: Stretches   Passive Hamstring Stretch  Left;3 reps;30 seconds    Passive Hamstring Stretch Limitations  supine with strap - overpressure at distal thigh    Quad Stretch  Left;3 reps;30 seconds    Quad Stretch Limitations  supine with strap; with hip flexor stretch    ITB Stretch  Left;3 reps;30 seconds      Knee/Hip Exercises: Aerobic   Recumbent Bike  L2 x 3 min - stopped after 3 min due to discomfort on seat      Knee/Hip Exercises: Supine   Bridges  15 reps    Other Supine Knee/Hip Exercises  single limb clamshell x 15 reps bil      Ankle Exercises: Stretches   Gastroc Stretch  3 reps;30 seconds      Ankle Exercises: Standing   Heel Raises  Both;20 reps    Other Standing Ankle Exercises  tandem stance 5x10 sec bil with intermittent UE support      Ankle Exercises: Seated   Other Seated Ankle Exercises  ankle PF/INV/EV x 20 reps; L2 band - cues needed for technique                  PT Long Term Goals - 06/21/19 2353      PT LONG TERM  GOAL #1   Title  Pt will be independent with HEP for pain reduction, improved functional mobility, flexibility and strength. (Target for all goals 6 weeks 08/02/19)    Time  6    Status  New      PT LONG TERM GOAL #2   Title  Pt will improve Lt ankle strength to 5/5 MMT tested in sitting to improve function.    Time  6    Period  Weeks    Status  New      PT LONG TERM GOAL #3   Title  Pt will report overall less than 4/10 pain with usual activity.    Time  6    Period  Weeks    Status  New      PT LONG TERM GOAL #4   Title  Pt will be able to ambulate community distances at least 1000 ft WNL gait pattern without complaints    Time  6    Period  Weeks    Status  New            Plan - 07/19/19 1528    Clinical Impression Statement  Pt needed mod cues to review HEP today, so no new exercises given.  Reported improved symptoms following sesion. No goals met as only 2nd visit.    Personal Factors and Comorbidities  Comorbidity 1    Comorbidities  DM    Examination-Activity Limitations  Stand;Stairs;Squat;Locomotion Level    Examination-Participation Restrictions  Cleaning;Community Activity;Shop    Stability/Clinical Decision Making  Evolving/Moderate complexity    Rehab Potential  Good    PT Frequency  2x / week   1-2   PT Duration  6 weeks    PT Treatment/Interventions  ADLs/Self Care Home Management;Aquatic Therapy;Cryotherapy;Electrical Stimulation;Iontophoresis 18m/ml Dexamethasone;Moist Heat;Ultrasound;Gait training;Stair training;Therapeutic activities;Therapeutic exercise;Balance training;Neuromuscular re-education;Manual techniques;Passive range of motion;Dry needling;Joint Manipulations;Spinal Manipulations;Vasopneumatic Device;Taping    PT Next Visit Plan  continue with ankle strengthening/ROM, work on hip strengthening    PT Home Exercise Plan  Access Code: [email or web address]   Consulted and Agree with Plan of Care  Patient       Patient will  benefit from skilled  therapeutic intervention in order to improve the following deficits and impairments:  Abnormal gait, Decreased activity tolerance, Decreased endurance, Decreased strength, Decreased range of motion, Difficulty walking, Postural dysfunction, Pain  Visit Diagnosis: Pain in left ankle and joints of left foot  Chronic left-sided low back pain, unspecified whether sciatica present  Other abnormalities of gait and mobility  Localized edema  Pain in left lower leg  Difficulty in walking, not elsewhere classified  Other symptoms and signs involving the musculoskeletal system     Problem List Patient Active Problem List   Diagnosis Date Noted  . Proteinuria 07/18/2019  . Acid reflux 07/16/2019  . History of COVID-19 07/16/2019  . CKD stage 1 due to type 2 diabetes mellitus ([place]) 01/03/2019  . Poorly controlled type 2 diabetes mellitus ([place]) 01/03/2019  . Morbid obesity ([place]) 07/04/2017  . Vitamin D deficiency 09/02/2014  . 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 Fatty liver disease, nonalcoholic 65/78/4696  . Essential hypertension 08/31/2013  . Medication management 08/31/2013  . Hyperlipidemia associated with type 2 diabetes mellitus ([place])       Laureen Abrahams, PT, DPT 07/19/19 3:29 PM    [facility] [address] [place], [place], [address] Phone: [phone]   Fax:  [phone]  Name: LAURALEE WATERS MRN: [id number] Date of Birth: 19-Apr-1956

## 2019-07-26 ENCOUNTER — Encounter: Payer: 59 | Admitting: Physical Therapy

## 2019-08-02 ENCOUNTER — Encounter: Payer: 59 | Admitting: Physical Therapy

## 2019-08-13 ENCOUNTER — Other Ambulatory Visit: Payer: 59

## 2019-08-13 ENCOUNTER — Other Ambulatory Visit: Payer: Self-pay

## 2019-08-13 DIAGNOSIS — R809 Proteinuria, unspecified: Secondary | ICD-10-CM

## 2019-08-14 ENCOUNTER — Other Ambulatory Visit: Payer: Self-pay | Admitting: Adult Health

## 2019-08-14 DIAGNOSIS — N289 Disorder of kidney and ureter, unspecified: Secondary | ICD-10-CM

## 2019-08-14 DIAGNOSIS — R801 Persistent proteinuria, unspecified: Secondary | ICD-10-CM

## 2019-08-14 LAB — BASIC METABOLIC PANEL WITH GFR
BUN: 8 mg/dL (ref 7–25)
CO2: 29 mmol/L (ref 20–32)
Calcium: 9.6 mg/dL (ref 8.6–10.4)
Chloride: 102 mmol/L (ref 98–110)
Creat: 0.97 mg/dL (ref 0.50–0.99)
GFR, Est African American: 72 mL/min/{1.73_m2} (ref >=60)
GFR, Est Non African American: 62 mL/min/{1.73_m2} (ref >=60)
Glucose, Bld: 87 mg/dL (ref 65–99)
Potassium: 4 mmol/L (ref 3.5–5.3)
Sodium: 140 mmol/L (ref 135–146)

## 2019-08-14 LAB — URINALYSIS, ROUTINE W REFLEX MICROSCOPIC
Bilirubin Urine: NEGATIVE
Hgb urine dipstick: NEGATIVE
Hyaline Cast: NONE SEEN /LPF
Ketones, ur: NEGATIVE
Nitrite: NEGATIVE
RBC / HPF: NONE SEEN /HPF (ref 0–2)
Specific Gravity, Urine: 1.015 (ref 1.001–1.03)
Squamous Epithelial / HPF: NONE SEEN /HPF (ref <=5)
pH: 5.5 (ref 5.0–8.0)

## 2019-08-15 ENCOUNTER — Other Ambulatory Visit: Payer: Self-pay

## 2019-08-15 ENCOUNTER — Ambulatory Visit (HOSPITAL_COMMUNITY)
Admission: RE | Admit: 2019-08-15 | Discharge: 2019-08-15 | Disposition: A | Discharge status: Home / Self Care | Payer: 59 | Source: Ambulatory Visit | Attending: Adult Health | Admitting: Adult Health

## 2019-08-15 DIAGNOSIS — R801 Persistent proteinuria, unspecified: Secondary | ICD-10-CM | POA: Diagnosis not present

## 2019-08-15 DIAGNOSIS — N289 Disorder of kidney and ureter, unspecified: Secondary | ICD-10-CM | POA: Diagnosis present

## 2019-08-16 ENCOUNTER — Ambulatory Visit: Payer: 59 | Admitting: Physical Therapy

## 2019-08-16 DIAGNOSIS — M25572 Pain in left ankle and joints of left foot: Secondary | ICD-10-CM

## 2019-08-16 DIAGNOSIS — M545 Low back pain, unspecified: Secondary | ICD-10-CM

## 2019-08-16 DIAGNOSIS — G8929 Other chronic pain: Secondary | ICD-10-CM

## 2019-08-16 DIAGNOSIS — R2689 Other abnormalities of gait and mobility: Secondary | ICD-10-CM | POA: Diagnosis not present

## 2019-08-16 DIAGNOSIS — R600 Localized edema: Secondary | ICD-10-CM | POA: Diagnosis not present

## 2019-08-16 NOTE — Therapy (Signed)
[facility] Physical Therapy [address] [place], [place], [address] Phone: [phone]   Fax:  [phone]  Physical Therapy Treatment  Patient Details  Name: LITITIA POSA MRN: [id number] Date of Birth: [birth year] Referring Provider (PT): Kathrynn Speed, MD   Encounter Date: 08/16/2019  PT End of Session - 08/16/19 1526    Visit Number  3    Number of Visits  8    Date for PT Re-Evaluation  08/02/19    Authorization Type  UHC    PT Start Time  [id number]    PT Stop Time  1525    PT Time Calculation (min)  40 min    Activity Tolerance  Patient tolerated treatment well    Behavior During Therapy  [facility] for tasks assessed/performed       Past Medical History:  Diagnosis Date  . Allergy   . Cataracts, bilateral   . History of kidney stones    x2 episodes  . Hyperlipidemia   . PONV (postoperative nausea and vomiting)   . Sleep apnea    test was dx, as mild- no further tx. required  . Type II or unspecified type diabetes mellitus without mention of complication, not stated as uncontrolled     Past Surgical History:  Procedure Laterality Date  . BREAST SURGERY  1999   Reduction Mammoplasties  . CHOLECYSTECTOMY  2003  . colonoscopsy  11/2007   neg- polyp removal with both procedures x2  . CYSTOSCOPY     surgery to extract stone x 1 with stent  . EUS N/A 07/26/2014   Procedure: UPPER ENDOSCOPIC ULTRASOUND (EUS) LINEAR;  Surgeon: Beryle Beams, MD;  Location: [facility];  Service: Endoscopy;  Laterality: N/A;  . LAPAROTOMY N/A 10/10/2014   Procedure: EXPLORATORY LAPAROTOMY EXCISION OF 24mm DUODENAL CARCINOID TUMOR;  Surgeon: [name], MD;  Location: [facility];  Service: General;  Laterality: N/A;  . LYSIS OF ADHESION  10/10/2014   Procedure: LYSIS OF ADHESION;  Surgeon: [name], MD;  Location: [facility];  Service: General;;  . TONSILLECTOMY     and adenoids removed age 64  . VAGINAL HYSTERECTOMY  2002    There were no vitals filed for this  visit.  Subjective Assessment - 08/16/19 1525    Subjective  relays she is doing much better, no pain upon arrival.    Pertinent History  DM, HTN    Limitations  Lifting;Standing;Walking;House hold activities    How long can you stand comfortably?  maybe 30 min at most    How long can you walk comfortably?  10 min    Diagnostic tests  XR: "2 views of the lumbar spine show degenerative disc disease at L5-S1.   There is also a grade 1 spondylolisthesis of L4 on L5."    Patient Stated Goals  improve pain    Pain Onset  More than a month ago        [facility] Adult PT Treatment/Exercise - 08/16/19 0001      Knee/Hip Exercises: Stretches   Passive Hamstring Stretch  Left;3 reps;30 seconds    Passive Hamstring Stretch Limitations  supine with strap - overpressure at distal thigh    ITB Stretch  Left;3 reps;30 seconds    Gastroc Stretch Limitations  slantboard 30 sec X 3    Other Knee/Hip Stretches  SKTC stretch 30 sec X 2 bilat      Knee/Hip Exercises: Aerobic   Nustep  8 min L5 UE/LE      Knee/Hip  Exercises: Supine   Bridges Limitations  2 X 10 reps holding 5 sec    Other Supine Knee/Hip Exercises  single limb clamshell x 15 reps bil, then bil clams X 15 all with green band      Ankle Exercises: Standing   Heel Raises  Both;20 reps    Other Standing Ankle Exercises  step ups and marches on airex pad with bilat fingertip support X 10 ea      Ankle Exercises: Supine   T-Band  L2 band ankle 4 way X 20 reps ea                  PT Long Term Goals - 06/21/19 2353      PT LONG TERM GOAL #1   Title  Pt will be independent with HEP for pain reduction, improved functional mobility, flexibility and strength. (Target for all goals 6 weeks 08/02/19)    Time  6    Status  New      PT LONG TERM GOAL #2   Title  Pt will improve Lt ankle strength to 5/5 MMT tested in sitting to improve function.    Time  6    Period  Weeks    Status  New      PT LONG TERM GOAL #3   Title  Pt will  report overall less than 4/10 pain with usual activity.    Time  6    Period  Weeks    Status  New      PT LONG TERM GOAL #4   Title  Pt will be able to ambulate community distances at least 1000 ft WNL gait pattern without complaints    Time  6    Period  Weeks    Status  New            Plan - 08/16/19 1527    Clinical Impression Statement  She is not having pain today and overall reports she is doing well with exercises and ADL's. Progressed her hip/ankle strength and stability program today with good tolerance and without complaints. Continue POC    Personal Factors and Comorbidities  Comorbidity 1    Comorbidities  DM    Examination-Activity Limitations  Stand;Stairs;Squat;Locomotion Level    Examination-Participation Restrictions  Cleaning;Community Activity;Shop    Stability/Clinical Decision Making  Evolving/Moderate complexity    Rehab Potential  Good    PT Frequency  2x / week   1-2   PT Duration  6 weeks    PT Treatment/Interventions  ADLs/Self Care Home Management;Aquatic Therapy;Cryotherapy;Electrical Stimulation;Iontophoresis 4mg /ml Dexamethasone;Moist Heat;Ultrasound;Gait training;Stair training;Therapeutic activities;Therapeutic exercise;Balance training;Neuromuscular re-education;Manual techniques;Passive range of motion;Dry needling;Joint Manipulations;Spinal Manipulations;Vasopneumatic Device;Taping    PT Next Visit Plan  continue with ankle strengthening/ROM, work on hip strengthening    PT Home Exercise Plan  Access Code: [email or web address]    Consulted and Agree with Plan of Care  Patient       Patient will benefit from skilled therapeutic intervention in order to improve the following deficits and impairments:  Abnormal gait, Decreased activity tolerance, Decreased endurance, Decreased strength, Decreased range of motion, Difficulty walking, Postural dysfunction, Pain  Visit Diagnosis: Pain in left ankle and joints of left foot  Chronic left-sided low back pain,  unspecified whether sciatica present  Other abnormalities of gait and mobility  Localized edema     Problem List Patient Active Problem List   Diagnosis Date Noted  . Proteinuria 07/18/2019  . Acid reflux 07/16/2019  . History of  COVID-19 07/16/2019  . CKD stage 1 due to type 2 diabetes mellitus ([place]) 01/03/2019  . Poorly controlled type 2 diabetes mellitus ([place]) 01/03/2019  . Morbid obesity ([place]) 07/04/2017  . Vitamin D deficiency 09/02/2014  . 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 Fatty liver disease, nonalcoholic 0000000  . Essential hypertension 08/31/2013  . Medication management 08/31/2013  . Hyperlipidemia associated with type 2 diabetes mellitus [facility])     Debbe Odea, PT,DPT 08/16/2019, 3:28 PM  [facility] Physical Therapy [address] [place], [place], [address] Phone: [phone]   Fax:  [phone]  Name: KATHELEEN HAMSON MRN: [id number] Date of Birth: 04/06/1956

## 2019-08-23 ENCOUNTER — Other Ambulatory Visit: Payer: 59

## 2019-08-23 ENCOUNTER — Other Ambulatory Visit: Payer: Self-pay

## 2019-08-23 DIAGNOSIS — N289 Disorder of kidney and ureter, unspecified: Secondary | ICD-10-CM

## 2019-08-23 DIAGNOSIS — R801 Persistent proteinuria, unspecified: Secondary | ICD-10-CM

## 2019-08-27 ENCOUNTER — Other Ambulatory Visit: Payer: Self-pay

## 2019-08-27 ENCOUNTER — Ambulatory Visit: Payer: 59 | Admitting: Physical Therapy

## 2019-08-27 DIAGNOSIS — R2689 Other abnormalities of gait and mobility: Secondary | ICD-10-CM | POA: Diagnosis not present

## 2019-08-27 DIAGNOSIS — G8929 Other chronic pain: Secondary | ICD-10-CM

## 2019-08-27 DIAGNOSIS — M545 Low back pain, unspecified: Secondary | ICD-10-CM

## 2019-08-27 DIAGNOSIS — R600 Localized edema: Secondary | ICD-10-CM

## 2019-08-27 DIAGNOSIS — M25572 Pain in left ankle and joints of left foot: Secondary | ICD-10-CM | POA: Diagnosis not present

## 2019-08-27 DIAGNOSIS — M79662 Pain in left lower leg: Secondary | ICD-10-CM

## 2019-08-27 LAB — BASIC METABOLIC PANEL WITH GFR
BUN: 9 mg/dL (ref 7–25)
CO2: 30 mmol/L (ref 20–32)
Calcium: 9.7 mg/dL (ref 8.6–10.4)
Chloride: 103 mmol/L (ref 98–110)
Creat: 0.85 mg/dL (ref 0.50–0.99)
GFR, Est African American: 85 mL/min/{1.73_m2} (ref >=60)
GFR, Est Non African American: 73 mL/min/{1.73_m2} (ref >=60)
Glucose, Bld: 78 mg/dL (ref 65–99)
Potassium: 3.9 mmol/L (ref 3.5–5.3)
Sodium: 142 mmol/L (ref 135–146)

## 2019-08-27 LAB — PROTEIN ELECTROPHORESIS, SERUM, WITH REFLEX
Albumin ELP: 3.9 g/dL (ref 3.8–4.8)
Alpha 1: 0.4 g/dL — ABNORMAL HIGH (ref 0.2–0.3)
Alpha 2: 0.9 g/dL (ref 0.5–0.9)
Beta 2: 0.4 g/dL (ref 0.2–0.5)
Beta Globulin: 0.5 g/dL (ref 0.4–0.6)
Gamma Globulin: 0.9 g/dL (ref 0.8–1.7)
Total Protein: 7 g/dL (ref 6.1–8.1)

## 2019-08-27 NOTE — Therapy (Signed)
[facility] Physical Therapy [address] [place], [place], [address] Phone: [phone]   Fax:  [phone]  Physical Therapy Treatment/Discharge PHYSICAL THERAPY DISCHARGE SUMMARY  Visits from Start of Care: 4  Current functional level related to goals / functional outcomes: Back to baseline   Remaining deficits: none   Education / Equipment: HEP Plan: Patient agrees to discharge.  Patient goals were met. Patient is being discharged due to meeting the stated rehab goals.  ?????        Patient Details  Name: Tracy Garrett MRN: [id number] Date of Birth: 08/05/1955 Referring Provider (PT): C. Blackman, MD   Encounter Date: 08/27/2019  PT End of Session - 08/27/19 1455    Visit Number  4    Number of Visits  8    Date for PT Re-Evaluation  08/02/19    Authorization Type  UHC    PT Start Time  1445    PT Stop Time  1525    PT Time Calculation (min)  40 min    Activity Tolerance  Patient tolerated treatment well    Behavior During Therapy  WFL for tasks assessed/performed       Past Medical History:  Diagnosis Date  . Allergy   . Cataracts, bilateral   . History of kidney stones    x2 episodes  . Hyperlipidemia   . PONV (postoperative nausea and vomiting)   . Sleep apnea    test was dx, as mild- no further tx. required  . Type II or unspecified type diabetes mellitus without mention of complication, not stated as uncontrolled     Past Surgical History:  Procedure Laterality Date  . BREAST SURGERY  1999   Reduction Mammoplasties  . CHOLECYSTECTOMY  2003  . colonoscopsy  11/2007   neg- polyp removal with both procedures x2  . CYSTOSCOPY     surgery to extract stone x 1 with stent  . EUS N/A 07/26/2014   Procedure: UPPER ENDOSCOPIC ULTRASOUND (EUS) LINEAR;  Surgeon: Patrick D Hung, MD;  [facility]: [facility];  Service: Endoscopy;  Laterality: N/A;  . LAPAROTOMY N/A 10/10/2014   Procedure: EXPLORATORY LAPAROTOMY EXCISION OF 8mm  DUODENAL CARCINOID TUMOR;  Surgeon: Todd Rosenbower, MD;  Location: [facility];  Service: General;  Laterality: N/A;  . LYSIS OF ADHESION  10/10/2014   Procedure: LYSIS OF ADHESION;  Surgeon: Todd Rosenbower, MD;  Location: [facility];  Service: General;;  . TONSILLECTOMY     and adenoids removed age 8  . VAGINAL HYSTERECTOMY  2002    There were no vitals filed for this visit.  Subjective Assessment - 08/27/19 1454    Subjective  relays no complaints, no pain today, she can now ambulate community distances and go up/down stairs without pain. she feels ready for PT discharge.    Pertinent History  DM, HTN    Limitations  Lifting;Standing;Walking;House hold activities    How long can you stand comfortably?  maybe 30 min at most    How long can you walk comfortably?  10 min    Diagnostic tests  XR: "2 views of the lumbar spine show degenerative disc disease at L5-S1.   There is also a grade 1 spondylolisthesis of L4 on L5."    Patient Stated Goals  improve pain    Pain Onset  More than a month ago         [facility] PT Assessment - 08/27/19 0001      Assessment   Medical Diagnosis  Left   Hip/IT Band; Back, Ankle    Referring Provider (PT)  C. Blackman, MD      AROM   Overall AROM Comments  WFL for lumbar and ankle ROM      Strength   Overall Strength Comments  5/5 MMT bilat leg strength                   [facility] Adult PT Treatment/Exercise - 08/27/19 0001      Knee/Hip Exercises: Stretches   Passive Hamstring Stretch  2 reps;30 seconds;Both    ITB Stretch  2 reps;30 seconds;Both    Gastroc Stretch Limitations  slantboard 30 sec X 4    Other Knee/Hip Stretches  SKTC stretch 30 sec X 2 bilat      Knee/Hip Exercises: Aerobic   Recumbent Bike  6 min L2      Knee/Hip Exercises: Standing   Knee Flexion  Both;15 reps    Knee Flexion Limitations  3 lb      Knee/Hip Exercises: Seated   Long Arc Quad  Both;15 reps    Long Arc Quad Weight  3 lbs.    Sit to Sand  15 reps       Knee/Hip Exercises: Supine   Bridges Limitations  2 X 10 reps holding 5 sec    Straight Leg Raises  Both;15 reps    Other Supine Knee/Hip Exercises  single limb clamshell x 15 reps bil, then bil clams X 15 all with green band      Ankle Exercises: Standing   Heel Raises  Both;20 reps   heel and toe on foam   Other Standing Ankle Exercises  hip abd and marches on airex pad without support X 15 ea. Tandem stance on airex pad 30 sec X 2 bilat      Ankle Exercises: Supine   T-Band  L2 band ankle 4 way X 25 reps ea                  PT Long Term Goals - 08/27/19 1456      PT LONG TERM GOAL #1   Title  Pt will be independent with HEP for pain reduction, improved functional mobility, flexibility and strength. (Target for all goals 6 weeks 08/02/19)    Time  6    Status  Achieved      PT LONG TERM GOAL #2   Title  Pt will improve Lt ankle strength to 5/5 MMT tested in sitting to improve function.    Time  6    Period  Weeks    Status  Achieved      PT LONG TERM GOAL #3   Title  Pt will report overall less than 4/10 pain with usual activity.    Time  6    Period  Weeks    Status  Achieved      PT LONG TERM GOAL #4   Title  Pt will be able to ambulate community distances at least 1000 ft WNL gait pattern without complaints    Time  6    Period  Weeks    Status  Achieved            Plan - 08/27/19 1510    Clinical Impression Statement  She has met all PT goals. She now has WFL ROM and strength without any complaints. She will be discharged and she had no further questions or concerns.    Personal Factors and Comorbidities  Comorbidity 1      Comorbidities  DM    Examination-Activity Limitations  Stand;Stairs;Squat;Locomotion Level    Examination-Participation Restrictions  Cleaning;Community Activity;Shop    Stability/Clinical Decision Making  Evolving/Moderate complexity    Rehab Potential  Good    PT Frequency  2x / week   1-2   PT Duration  6 weeks    PT  Treatment/Interventions  ADLs/Self Care Home Management;Aquatic Therapy;Cryotherapy;Electrical Stimulation;Iontophoresis 30m/ml Dexamethasone;Moist Heat;Ultrasound;Gait training;Stair training;Therapeutic activities;Therapeutic exercise;Balance training;Neuromuscular re-education;Manual techniques;Passive range of motion;Dry needling;Joint Manipulations;Spinal Manipulations;Vasopneumatic Device;Taping    PT Next Visit Plan  continue with ankle strengthening/ROM, work on hip strengthening    PT Home Exercise Plan  Access Code: [email or web address]   Consulted and Agree with Plan of Care  Patient       Patient will benefit from skilled therapeutic intervention in order to improve the following deficits and impairments:  Abnormal gait, Decreased activity tolerance, Decreased endurance, Decreased strength, Decreased range of motion, Difficulty walking, Postural dysfunction, Pain  Visit Diagnosis: Pain in left ankle and joints of left foot  Chronic left-sided low back pain, unspecified whether sciatica present  Other abnormalities of gait and mobility  Localized edema  Pain in left lower leg     Problem List Patient Active Problem List   Diagnosis Date Noted  . Proteinuria 07/18/2019  . Acid reflux 07/16/2019  . History of COVID-19 07/16/2019  . CKD stage 1 due to type 2 diabetes mellitus ([place] 01/03/2019  . Poorly controlled type 2 diabetes mellitus ([place] 01/03/2019  . Morbid obesity ([place] 07/04/2017  . Vitamin D deficiency 09/02/2014  . 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 Fatty liver disease, nonalcoholic 062/83/6629 . Essential hypertension 08/31/2013  . Medication management 08/31/2013  . Hyperlipidemia associated with type 2 diabetes mellitus ([facility]     BDebbe Odea PT,DPT 08/27/2019, 3:37 PM  [facility] [address] [place] [address]Phone: [phone]  Fax:  [phone] Name: Tracy CIOLINOMRN:  [id number] of Birth: [birth year]

## 2019-08-28 ENCOUNTER — Other Ambulatory Visit: Payer: Self-pay | Admitting: Adult Health

## 2019-08-28 DIAGNOSIS — R801 Persistent proteinuria, unspecified: Secondary | ICD-10-CM

## 2019-08-28 LAB — PROTEIN, URINE, 24 HOUR: Protein, 24H Urine: 972 mg/24 h — ABNORMAL HIGH (ref 0–149)

## 2019-09-03 ENCOUNTER — Telehealth: Payer: Self-pay | Admitting: Adult Health

## 2019-09-03 NOTE — Telephone Encounter (Signed)
patient lvm to request referral to have liver checked. Please advise your recommendations. Patient states she is aware of Neph referral, but also wanted liver checked. Patient requested call back regarding request,

## 2019-09-05 ENCOUNTER — Other Ambulatory Visit: Payer: Self-pay | Admitting: Adult Health

## 2019-09-05 DIAGNOSIS — K760 Fatty (change of) liver, not elsewhere classified: Secondary | ICD-10-CM

## 2019-09-13 ENCOUNTER — Ambulatory Visit
Admission: RE | Admit: 2019-09-13 | Discharge: 2019-09-13 | Disposition: A | Discharge status: Home / Self Care | Payer: 59 | Source: Ambulatory Visit | Attending: Adult Health | Admitting: Adult Health

## 2019-09-13 ENCOUNTER — Encounter: Payer: Self-pay | Admitting: Adult Health

## 2019-09-13 DIAGNOSIS — N281 Cyst of kidney, acquired: Secondary | ICD-10-CM | POA: Insufficient documentation

## 2019-09-13 DIAGNOSIS — K760 Fatty (change of) liver, not elsewhere classified: Secondary | ICD-10-CM

## 2019-10-18 ENCOUNTER — Ambulatory Visit: Payer: 59 | Admitting: Podiatry

## 2019-10-18 ENCOUNTER — Other Ambulatory Visit: Payer: Self-pay | Admitting: Podiatry

## 2019-10-18 ENCOUNTER — Encounter: Payer: Self-pay | Admitting: Podiatry

## 2019-10-18 ENCOUNTER — Other Ambulatory Visit: Payer: Self-pay

## 2019-10-18 ENCOUNTER — Ambulatory Visit (INDEPENDENT_AMBULATORY_CARE_PROVIDER_SITE_OTHER): Payer: 59

## 2019-10-18 DIAGNOSIS — M7672 Peroneal tendinitis, left leg: Secondary | ICD-10-CM

## 2019-10-18 DIAGNOSIS — M79672 Pain in left foot: Secondary | ICD-10-CM

## 2019-10-18 NOTE — Progress Notes (Signed)
Subjective:   Patient ID: Tracy Garrett, female   DOB: 64 y.o.   MRN: [id number]   HPI Patient presents stating I had pain in the outside of my left foot and also it feels weak and at times it feels like it collapses.  States is been going on for around a year and patient does not smoke likes to be active   Review of Systems  All other systems reviewed and are negative.       Objective:  Physical Exam Vitals and nursing note reviewed.  Constitutional:      Appearance: She is well-developed.  Pulmonary:     Effort: Pulmonary effort is normal.  Musculoskeletal:        General: Normal range of motion.  Skin:    General: Skin is warm.  Neurological:     Mental Status: She is alert.     Neurovascular status intact muscle strength found to be adequate range of motion within normal limits.  Patient is found to have inflammation pain of the base of the fifth metatarsal left with no muscle strength loss or no indications of neurological pathology.  Patient is found to have good digit perfusion well oriented x3     Assessment:  Inflammatory tendinitis of the lateral foot peroneal with possibility for other unknown etiology but no indication of muscle dysfunction     Plan:  H&P conditions reviewed and at this point I did do a careful sterile injection of the area 3 mg Dexasone Kenalog 5 g Xylocaine and applied fascial brace to lift up the lateral side of the foot.  Advised on ice possible orthotics and we will see back 3 weeks to see response and decide if orthotics are necessary  X-rays indicate that there is no signs of fracture no signs of pathology with moderate depression of the arch and bunion deformity

## 2019-10-29 ENCOUNTER — Ambulatory Visit: Payer: 59 | Admitting: Adult Health

## 2019-11-04 ENCOUNTER — Other Ambulatory Visit: Payer: Self-pay | Admitting: Adult Health

## 2019-11-07 ENCOUNTER — Ambulatory Visit: Payer: 59 | Admitting: Podiatry

## 2019-11-20 ENCOUNTER — Other Ambulatory Visit: Payer: Self-pay | Admitting: Radiology

## 2019-11-22 ENCOUNTER — Other Ambulatory Visit: Payer: Self-pay

## 2019-11-22 ENCOUNTER — Encounter: Payer: Self-pay | Admitting: *Deleted

## 2019-11-22 ENCOUNTER — Ambulatory Visit: Payer: 59 | Admitting: Podiatry

## 2019-11-22 ENCOUNTER — Encounter: Payer: Self-pay | Admitting: Podiatry

## 2019-11-22 DIAGNOSIS — Z170 Estrogen receptor positive status [ER+]: Secondary | ICD-10-CM | POA: Insufficient documentation

## 2019-11-22 DIAGNOSIS — M7672 Peroneal tendinitis, left leg: Secondary | ICD-10-CM

## 2019-11-22 DIAGNOSIS — C50411 Malignant neoplasm of upper-outer quadrant of right female breast: Secondary | ICD-10-CM | POA: Insufficient documentation

## 2019-11-23 NOTE — Progress Notes (Signed)
Subjective:   Patient ID: Tracy Garrett, female   DOB: 64 y.o.   MRN: [id number]   HPI Patient states she is feeling a lot better with discomfort only if she is been on her foot for periods of time but does not have the sharp pain anymore   ROS      Objective:  Physical Exam  Neurovascular status intact with patient's heels doing much better with pain still present upon deep palpation but overall quite a bit of improvement     Assessment:  Fasciitis symptoms which are improving conservatively     Plan:  H&P reviewed fasciitis and developed long-term plan to prevent reoccurrence.  Discussed physical therapy shoe gear modifications elevated heels topical and oral medicines as needed.  Patient is discharged will be seen back as needed

## 2019-11-26 ENCOUNTER — Ambulatory Visit: Payer: 59 | Admitting: Adult Health Nurse Practitioner

## 2019-11-26 ENCOUNTER — Encounter: Payer: Self-pay | Admitting: Adult Health Nurse Practitioner

## 2019-11-26 ENCOUNTER — Other Ambulatory Visit: Payer: Self-pay

## 2019-11-26 ENCOUNTER — Ambulatory Visit: Payer: 59 | Admitting: Adult Health

## 2019-11-26 VITALS — BP 140/80 | HR 93 | Temp 97.5°F | Wt 205.4 lb

## 2019-11-26 DIAGNOSIS — E1169 Type 2 diabetes mellitus with other specified complication: Secondary | ICD-10-CM | POA: Diagnosis not present

## 2019-11-26 DIAGNOSIS — E1122 Type 2 diabetes mellitus with diabetic chronic kidney disease: Secondary | ICD-10-CM | POA: Diagnosis not present

## 2019-11-26 DIAGNOSIS — R809 Proteinuria, unspecified: Secondary | ICD-10-CM

## 2019-11-26 DIAGNOSIS — D649 Anemia, unspecified: Secondary | ICD-10-CM

## 2019-11-26 DIAGNOSIS — K219 Gastro-esophageal reflux disease without esophagitis: Secondary | ICD-10-CM

## 2019-11-26 DIAGNOSIS — E119 Type 2 diabetes mellitus without complications: Secondary | ICD-10-CM

## 2019-11-26 DIAGNOSIS — Z8639 Personal history of other endocrine, nutritional and metabolic disease: Secondary | ICD-10-CM

## 2019-11-26 DIAGNOSIS — I10 Essential (primary) hypertension: Secondary | ICD-10-CM

## 2019-11-26 DIAGNOSIS — R8290 Unspecified abnormal findings in urine: Secondary | ICD-10-CM

## 2019-11-26 DIAGNOSIS — E785 Hyperlipidemia, unspecified: Secondary | ICD-10-CM

## 2019-11-26 DIAGNOSIS — N182 Chronic kidney disease, stage 2 (mild): Secondary | ICD-10-CM

## 2019-11-26 DIAGNOSIS — E559 Vitamin D deficiency, unspecified: Secondary | ICD-10-CM

## 2019-11-26 MED ORDER — METFORMIN HCL ER 500 MG PO TB24: 500.0000 mg | ORAL_TABLET | Freq: Every day | ORAL | 11 refills | Status: DC

## 2019-11-26 NOTE — Progress Notes (Signed)
FOLLOW UP 3 MONTH  Assessment and Plan:  l Tracy Garrett was seen today for follow-up.  Diagnoses and all orders for this visit:  Essential hypertension Continue current medications: Monitor blood pressure at home; call if consistently over 130/80 Continue DASH diet.   Reminder to go to the ER if any CP, SOB, nausea, dizziness, severe HA, changes vision/speech, left arm numbness and tingling and jaw pain. -     CBC with Differential/Platelet -     COMPLETE METABOLIC PANEL WITH GFR -     TSH -     Magnesium  Hyperlipidemia associated with type 2 diabetes mellitus ([name]) Continue medications:rosuvastatin 11m Discussed dietary and exercise modifications Low fat diet -     Lipid panel  CKD stage 2 due to type 2 diabetes mellitus ([name]) Increase fluids  Avoid NSAIDS Blood pressure control Monitor sugars  Will continue to monitor  Gastroesophageal reflux disease without esophagitis Doing well at this time Continue: pantropazole 273mdaily Diet discussed Monitor for triggers Avoid food with high acid content Avoid excessive cafeine Increase water intake  Morbid obesity ([name]) Discussed dietary and exercise modifications  Controlled type 2 diabetes mellitus without complication, without long-term current use of insulin ([name]) Start Metformin 50041m tablet daily.  Discussed general issues about diabetes pathophysiology and management. Education: Reviewed ABCs of diabetes management (respective goals in parentheses):  A1C (<7), blood pressure (<130/80), and cholesterol (LDL <70) Dietary recommendations Encouraged aerobic exercise.  Discussed foot care, check daily Yearly retinal exam Dental exam every 6 months Monitor blood glucose, discussed goal for patient -     Hemoglobin A1c RX     metFORMIN (GLUCOPHAGE XR) 500 MG 24 hr tablet; Take 1 tablet (500 mg total) by mouth daily with breakfast.   Anemia, unspecified type -     Iron,Total/Total Iron Binding Cap  History of  non anemic vitamin B12 deficiency -     Vitamin B12  Vitamin D deficiency Continue supplementation to maintain goal of 60-100 Taking Vitamin D 5,000 IU daily -     VITAMIN D 25 Hydroxy (Vit-D Deficiency, Fractures)  Proteinuria, unspecified type -     Urinalysis w microscopic + reflex cultur   Other orders -     Urinalysis w microscopic + reflex cultur -     Urine Culture -     REFLEXIVE URINE CULTURE     Continue diet and meds as discussed. Further disposition pending results of labs. Discussed med's effects and SE's.   Over 30 minutes of face to face exam, counseling, chart review, and critical decision making was performed.   Future Appointments  Date Time Provider [place]/01/2020  9:00 AM [facility]- ECHO 1-RUTH [facility]23/2021 10:00 AM Magrinat, GusVirgie DadD [facility] None  12/29/2019 11:30 AM MC-SCREENING [facility]SDSC None  01/02/2020 12:30 PM MC-NM INJ 1 [facility]-NM [facility]  01/03/2020  9:00 AM [facility] LAB 5 [facility] None  01/03/2020  9:15 AM [facility] [place] [facility] None  01/03/2020 10:15 AM [facility] INFUSION [facility] None  01/10/2020  8:45 AM [facility] LAB 6 [facility] None  01/10/2020  9:00 AM [facility] [place] [facility] None  01/10/2020  9:30 AM Magrinat, GusVirgie DadD [facility] None  01/10/2020 10:15 AM [facility] INFUSION [facility] None  01/15/2020 10:00 AM CorLiane ComberP [facility] None  01/17/2020  8:45 AM [facility] LAB 6 [facility] None  01/17/2020  9:00 AM [facility] [place] [facility] None  01/17/2020 10:00 AM [facility] INFUSION [facility] None  01/24/2020  9:45 AM [facility]  LAB 6 [facility] None  01/24/2020 10:00 AM [facility] [place] [facility] None  01/24/2020 11:00 AM [facility] INFUSION [facility] None  01/31/2020  8:45 AM [facility] LAB 1 [facility] None  01/31/2020  9:00 AM [facility] [place] FLUSH [facility] None  01/31/2020 10:00 AM [facility] INFUSION [facility] None     ----------------------------------------------------------------------------------------------------------------------  HPI 64 y.o. female  presents for 3 month follow up on HTN, HLD, diabetes, obesity and vitamin D deficiency.   She has recently been diagnosed with breast cancer, right.  She had biopsy last week and is scheduled for surgery end of July.  She is checking her blood glucose at home, typically fasting but also when she gets home from work.  She reports the lowest reading at 60's and highest in the 130's.  Hx of covid 19 in 05/2019, reports has recovered without sequela.   He has hx of mononeuritis and pain through R L hip and knee pain, has done PT in the past which improved sx, will be restarting this week, intermittently feels weak through her ankle with walking, denies lumbar pain and reports restarted taking gabapentin.  She had a repeat sleep study in 06/2017 which was negative for OSA. She was recommended to continue with weight loss.   she has a diagnosis of GERD which is currently managed by pantoprazole 20 mg daily per GI due to silent reflux she reports symptoms is currently well controlled, and denies breakthrough reflux, burning in chest, hoarseness or cough.    BMI is Body mass index is 36.97 kg/m., she has not been working on exercise due to extremity pain. She is working on diet reports she is reducing her carb intake, increasing water intake. She cut down on sweets, bread and red meat intermittently.   Wt Readings from Last 3 Encounters:  11/28/19 205 lb 4.8 oz (93.1 kg)  11/26/19 205 lb 6.4 oz (93.2 kg)  07/17/19 211 lb 9.6 oz (96 kg)   Her blood pressure has been controlled at home, today their BP is BP: 140/80  She does not workout. She denies chest pain, shortness of breath, dizziness.   She has been prescribed cholesterol medication but is strongly opposed to taking, but recently agreed to trial low dose low frequency rosuvastatin 5 mg nightly and  tolerating without SE. Her cholesterol is not at goal. The cholesterol last visit was:   Lab Results  Component Value Date   CHOL 216 (H) 11/26/2019   HDL 67 11/26/2019   LDLCALC 124 (H) 11/26/2019   TRIG 139 11/26/2019   CHOLHDL 3.2 11/26/2019    She has been working on diet for T2 diabetes with CKD 2, she is prescribed Synjardy XR 12.10-998 mg BID but has stopped taking this and does not feel comfortable continuing this medication.  Discussed changing regiment and denies increased appetite, nausea, paresthesia of the feet, polydipsia, polyuria, visual disturbances, vomiting and weight loss. She does check fasting glucose occasionally. Last A1C in the office was:  Lab Results  Component Value Date   HGBA1C 6.5 (H) 11/26/2019   She has CKD 2 associated with T2DM monitored at this office:  Lab Results  Component Value Date   GFRAA >60 11/28/2019   Patient is on Vitamin D supplement and approaching goal of 60 at the last check:   Lab Results  Component Value Date   VD25OH 45 11/26/2019         Current Medications:  Current Outpatient Medications on File Prior to Visit  Medication Sig   Cholecalciferol (  VITAMIN D3) 5000 UNITS TABS Take 1 tablet by mouth daily.    glucose blood (CONTOUR NEXT TEST) test strip Check blood sugar 1 time daily-[id number]   losartan (COZAAR) 50 MG tablet Take 1/2-1 tab daily for blood pressure and to protect kidneys.   Omega-3 Fatty Acids (OMEGA 3 PO) Take 1 capsule by mouth 2 (two) times daily.   OVER THE COUNTER MEDICATION Takes CoQ10 1 capsule daily   pantoprazole (PROTONIX) 20 MG tablet Take 20 mg by mouth daily.   No current facility-administered medications on file prior to visit.     Allergies:  Allergies  Allergen Reactions   Hydromorphone Rash    Arm reddened at IV med site   Other Rash    NUTS CAUSE MOUTH TO [facility]     Medical History:  Past Medical History:  Diagnosis Date   Allergy    Cataracts, bilateral    Family  history of breast cancer    Family history of multiple myeloma    Fatty liver disease, nonalcoholic 5/68/1275   [place] appears normal on follow up US 09/2019   History of kidney stones    x2 episodes   Hyperlipidemia    Hypertension    PONV (postoperative nausea and vomiting)    Sleep apnea    test was dx, as mild- no further tx. required   Type II or unspecified type diabetes mellitus without mention of complication, not stated as uncontrolled    Family history- Reviewed and unchanged Social history- Reviewed and unchanged   Review of Systems:  Review of Systems  Constitutional: Negative for malaise/fatigue and weight loss.  HENT: Negative for congestion, hearing loss, sore throat and tinnitus.   Eyes: Negative for blurred vision and double vision.  Respiratory: Negative for cough, sputum production, shortness of breath and wheezing.   Cardiovascular: Negative for chest pain, palpitations, orthopnea, claudication and leg swelling.  Gastrointestinal: Negative for abdominal pain, blood in stool, constipation, diarrhea, heartburn, melena, nausea and vomiting.  Genitourinary: Negative.   Musculoskeletal: Negative for joint pain and myalgias.  Skin: Negative for rash.  Neurological: Negative for dizziness, tingling, sensory change, weakness and headaches.  Endo/Heme/Allergies: Negative for environmental allergies and polydipsia.  Psychiatric/Behavioral: Negative for depression and memory loss. The patient is not nervous/anxious and does not have insomnia.   All other systems reviewed and are negative.    Physical Exam: BP 140/80    Pulse 93    Temp (!) 97.5 F (36.4 C)    Wt 205 lb 6.4 oz (93.2 kg)    SpO2 98%    BMI 36.97 kg/m  Wt Readings from Last 3 Encounters:  11/28/19 205 lb 4.8 oz (93.1 kg)  11/26/19 205 lb 6.4 oz (93.2 kg)  07/17/19 211 lb 9.6 oz (96 kg)   General Appearance: Well nourished, in no apparent distress. Eyes: PERRLA, EOMs, conjunctiva no swelling or  erythema Sinuses: No Frontal/maxillary tenderness ENT/Mouth: Ext aud canals clear, TMs without erythema, bulging. No erythema, swelling, or exudate on post pharynx.  Tonsils not swollen or erythematous. Hearing normal.  Neck: Supple, thyroid normal.  Respiratory: Respiratory effort normal, BS equal bilaterally without rales, rhonchi, wheezing or stridor.  Cardio: RRR with no MRGs. Brisk peripheral pulses without edema.  Abdomen: Soft, + BS.  Non tender, no guarding, rebound, hernias, masses. Lymphatics: Non tender without lymphadenopathy.  Musculoskeletal: Full ROM, 5/5 strength, Normal gait Skin: Warm, dry; without lesions, ecchymosis, rash Neuro: Cranial nerves intact. No cerebellar symptoms.  Psych: Awake and oriented X  3, normal affect, Insight and Judgment appropriate.    Garnet Sierras, NP 3:05 PM [facility]

## 2019-11-27 NOTE — Progress Notes (Signed)
[place]  Telephone:([phone]) [phone] Fax:(336) [phone]     ID: Tracy Garrett DOB: 07-26-1955  MR#: [id number]  GXQ#:[id number]  Patient Care Team: Tracy Pinto, MD as PCP - General (Internal Medicine) Tracy Kaufmann, RN as Oncology Nurse Navigator Tracy Germany, RN as Oncology Nurse Navigator Tracy Luna, MD as Consulting Physician (General Surgery) Tracy Garrett, Tracy Dad, MD as Consulting Physician (Oncology) Tracy Gibson, MD as Attending Physician (Radiation Oncology) Tracy Rossetti, MD as Consulting Physician (Orthopedic Surgery) Tracy Garrett, DPM as Consulting Physician (Podiatry) Tracy Cruel, MD OTHER MD:  CHIEF COMPLAINT: triple positive breast cancer  CURRENT TREATMENT: Awaiting definitive surgery   HISTORY OF CURRENT ILLNESS: Tracy Garrett had routine screening mammography on 05/01/2020 showing a possible abnormality in the right breast. She underwent right diagnostic mammography with tomography at [facility] on 05/09/2019 showing: breast density category A; probably-benign regional calcifications in the right breast at 11 o'clock. Short term follow up was recommended.   She returned for follow up at [facility] on 11/13/2019. She underwent right diagnostic mammography and right breast ultrasonography showing: breast composition A; indeterminate calcifications in upper-outer right breast spanning 5 cm; 0.8 cm irregular mass in right breast at 9 o'clock.  Accordingly on 11/20/2019 she proceeded to biopsy of the right breast areas in question. The pathology from this procedure ([id number]) showed:  1. Right breast, UOQ  - foci of invasive and in situ mammary carcinoma, grade 2-3, e-cadherin positive.  2. Right breast, 9 o'clock  - invasive mammary carcinoma, grade 2-3, e-cadherin positive  - Prognostic indicators significant for: estrogen receptor, 95% positive and progesterone receptor, 25% positive, both with strong staining intensity.  Proliferation marker Ki67 at 20%. HER2 equivocal by immunohistochemistry (2+), but positive by fluorescent in situ hybridization with a signals ratio 3.30 and number per cell 7.25.  The patient's subsequent history is as detailed below.   INTERVAL HISTORY: Tracy Garrett was evaluated in the multidisciplinary breast cancer clinic on 11/28/2019 accompanied by her husband Tracy Garrett. Her case was also presented at the multidisciplinary breast cancer conference on the same day. At that time a preliminary plan was proposed: Neoadjuvant chemoimmunotherapy followed by definitive surgery radiation, and antiestrogens, genetics referral   REVIEW OF SYSTEMS: There were no specific symptoms leading to the original mammogram, which was routinely scheduled. On the provided questionnaire, Tracy Garrett reports sinus problems, kidney disease, muscle stiffness, and diabetes. The patient denies unusual headaches, visual changes, nausea, vomiting, stiff neck, dizziness, or gait imbalance. There has been no cough, phlegm production, or pleurisy, no chest pain or pressure, and no change in bowel or bladder habits. The patient denies fever, rash, bleeding, unexplained fatigue or unexplained weight loss. A detailed review of systems was otherwise entirely negative.   PAST MEDICAL HISTORY: Past Medical History:  Diagnosis Date   Allergy    Cataracts, bilateral    Fatty liver disease, nonalcoholic 1/49/7026   [place] appears normal on follow up US 09/2019   History of kidney stones    x2 episodes   Hyperlipidemia    Hypertension    PONV (postoperative nausea and vomiting)    Sleep apnea    test was dx, as mild- no further tx. required   Type II or unspecified type diabetes mellitus without mention of complication, not stated as uncontrolled     PAST SURGICAL HISTORY: Past Surgical History:  Procedure Laterality Date   BREAST SURGERY  1999   Reduction Mammoplasties   CHOLECYSTECTOMY  2003   colonoscopsy  11/2007  neg- polyp removal with both procedures x2   CYSTOSCOPY     surgery to extract stone x 1 with stent   EUS N/A 07/26/2014   Procedure: UPPER ENDOSCOPIC ULTRASOUND (EUS) LINEAR;  Surgeon: Beryle Beams, MD;  Location: [facility] ENDOSCOPY;  Service: Endoscopy;  Laterality: N/A;   LAPAROTOMY N/A 10/10/2014   Procedure: 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;  Surgeon: TJackolyn Confer MD;  Location: [facility] ORS;  Service: General;  Laterality: N/A;   LYSIS OF ADHESION  10/10/2014   Procedure: LYSIS OF ADHESION;  Surgeon: TJackolyn Confer MD;  Location: [facility] ORS;  Service: General;;   TONSILLECTOMY     and adenoids removed age [age]  VAGINAL HYSTERECTOMY  2002    FAMILY HISTORY: Family History  Problem Relation Age of Onset   Breast cancer Mother 761  Heart disease Father        smoker   Diabetes type I Sister    Alzheimer's disease Maternal Grandmother    Emphysema Paternal Grandfather        smoker   Cancer Paternal Grandfather    Heart attack Paternal Grandfather    CAD Brother 550      stents   Breast cancer Maternal Aunt    Breast cancer Cousin    Her father died at age 10016 Her mother died at age 64 She has 2 brothers and had 1 sister who is deceased. She reports breast cancer on her maternal side-- her mother at age 64 an aunt, and a cousin.   GYNECOLOGIC HISTORY:  No LMP recorded. Patient has had a hysterectomy. Menarche: 64years old Age at first live birth: 64years old GPiquaP 2 LMP unsure Contraceptive: never used HRT never used  Hysterectomy? Yes, 2002 BSO? 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 Tracy [place] age 64 [place]rtle [place] age 64 [place]    ADVANCED DIRECTIVES: In the absence of any documentation to the contrary, the patient's spouse is their HCPOA.    HEALTH MAINTENANCE: Social History   Tobacco Use    Smoking status: Never Smoker   Smokeless tobacco: Never Used  Vaping Use   Vaping Use: Never used  Substance Use Topics   Alcohol use: No   Drug use: No     Colonoscopy: 02/2019 (Dr. MCollene Mares, repeat due 2025  PAP: none on file (s/p hysterectomy)  Bone density: never done   Allergies  Allergen Reactions   Hydromorphone Rash    Arm reddened at IV med site   Other Rash    [facility]   Current Outpatient Medications  Medication Sig Dispense Refill   Cholecalciferol (VITAMIN D3) 5000 UNITS TABS Take 1 tablet by mouth daily.      glucose blood (CONTOUR NEXT TEST) test strip Check blood sugar 1 time daily-DX-E11.9 100 each 12   losartan (COZAAR) 50 MG tablet Take 1/2-1 tab daily for blood pressure and to protect kidneys. 90 tablet 1   pantoprazole (PROTONIX) 20 MG tablet Take 20 mg by mouth daily.     rosuvastatin (CRESTOR) 5 MG tablet Take 1 tablet daily for Cholesterol 90 tablet 1   acetaminophen (TYLENOL) 500 MG tablet Take 1,000 mg by mouth every 6 (six) hours as needed for mild pain. (Patient not taking: Reported on 11/28/2019)     diphenhydrAMINE (BENADRYL) 25 mg capsule  Take 25 mg by mouth every 6 (six) hours as needed for itching. (Patient not taking: Reported on 11/28/2019)     gabapentin (NEURONTIN) 300 MG capsule Take 1 tablet 3 x day for pain (Patient not taking: Reported on 11/28/2019) 90 capsule 2   hydrocortisone cream 1 % Apply 1 application topically 3 (three) times daily as needed for itching. (Patient not taking: Reported on 11/28/2019)     Ibuprofen (ADVIL PO) Take by mouth as needed. (Patient not taking: Reported on 11/28/2019)     metFORMIN (GLUCOPHAGE XR) 500 MG 24 hr tablet Take 1 tablet (500 mg total) by mouth daily with breakfast. 30 tablet 11   Omega-3 Fatty Acids (OMEGA 3 PO) Take 1 capsule by mouth 2 (two) times daily.     OVER THE COUNTER MEDICATION Takes CoQ10 1 capsule daily     sulfamethoxazole-trimethoprim (BACTRIM DS)  800-160 MG tablet Take 1 tablet by mouth 2 (two) times daily. 10 tablet 0   No current facility-administered medications for this visit.    OBJECTIVE: African-American woman who appears younger than stated age  64:   11/28/19 1248  BP: (!) 154/89  Pulse: 80  Resp: 20  Temp: 98.9 F (37.2 C)  SpO2: 99%     Body mass index is 35.8 kg/m.   Wt Readings from Last 3 Encounters:  11/28/19 205 lb 4.8 oz (93.1 kg)  11/26/19 205 lb 6.4 oz (93.2 kg)  07/17/19 211 lb 9.6 oz (96 kg)      ECOG FS:1 - Symptomatic but completely ambulatory  Ocular: Sclerae unicteric, pupils round and equal Ear-nose-throat: Wearing a mask Lymphatic: No cervical or supraclavicular adenopathy Lungs no rales or rhonchi Heart regular rate and rhythm Abd soft, nontender, positive bowel sounds MSK no focal spinal tenderness, no joint edema Neuro: non-focal, well-oriented, appropriate affect Breasts: The right breast is status post recent biopsies.  I do not palpate a mass.  The left breast is benign.  Both axillae are benign.   LAB RESULTS:  CMP     Component Value Date/Time   NA 141 11/28/2019 1217   K 3.6 11/28/2019 1217   CL 106 11/28/2019 1217   CO2 26 11/28/2019 1217   GLUCOSE 125 (H) 11/28/2019 1217   BUN 11 11/28/2019 1217   CREATININE 0.94 11/28/2019 1217   CREATININE 0.82 11/26/2019 1533   CALCIUM 9.2 11/28/2019 1217   PROT 7.6 11/28/2019 1217   ALBUMIN 3.9 11/28/2019 1217   AST 10 (L) 11/28/2019 1217   ALT 16 11/28/2019 1217   ALKPHOS 75 11/28/2019 1217   BILITOT 0.6 11/28/2019 1217   GFRNONAA >60 11/28/2019 1217   GFRNONAA 76 11/26/2019 1533   GFRAA >60 11/28/2019 1217   GFRAA 88 11/26/2019 1533    Lab Results  Component Value Date   ALBUMINELP 3.9 08/23/2019   A1GS 0.4 (H) 08/23/2019   A2GS 0.9 08/23/2019   BETS 0.5 08/23/2019   BETA2SER 0.4 08/23/2019   GAMS 0.9 08/23/2019   SPEI  08/23/2019     Comment:     . Alpha-1 globulin increase noted. .     Lab Results    Component Value Date   WBC 8.8 11/28/2019   NEUTROABS 5.4 11/28/2019   HGB 13.3 11/28/2019   HCT 40.7 11/28/2019   MCV 92.1 11/28/2019   PLT 267 11/28/2019    No results found for: LABCA2  No components found for: GLOVFI433  No results for input(s): INR in the last 168 hours.  No results found  for: LABCA2  No results found for: NLG921  No results found for: JHE174  No results found for: YCX448  No results found for: CA2729  No components found for: HGQUANT  No results found for: CEA1 / No results found for: CEA1   No results found for: AFPTUMOR  No results found for: CHROMOGRNA  No results found for: KPAFRELGTCHN, LAMBDASER, KAPLAMBRATIO (kappa/lambda light chains)  No results found for: HGBA, HGBA2QUANT, HGBFQUANT, HGBSQUAN (Hemoglobinopathy evaluation)   No results found for: LDH  Lab Results  Component Value Date   IRON 54 11/26/2019   TIBC 378 11/26/2019   IRONPCTSAT 14 (L) 11/26/2019   (Iron and TIBC)  Lab Results  Component Value Date   FERRITIN 91 05/21/2015    Urinalysis    Component Value Date/Time   COLORURINE YELLOW 11/26/2019 1533   APPEARANCEUR CLOUDY (A) 11/26/2019 1533   LABSPEC 1.019 11/26/2019 1533   PHURINE < OR = 5.0 11/26/2019 1533   GLUCOSEU 1+ (A) 11/26/2019 1533   HGBUR NEGATIVE 11/26/2019 1533   BILIRUBINUR NEG 11/22/2013 1031   BILIRUBINUR neg 10/28/2013 [place] 11/26/2019 1533   PROTEINUR 2+ (A) 11/26/2019 1533   UROBILINOGEN 0.2 11/22/2013 1031   NITRITE NEGATIVE 08/13/2019 1359   LEUKOCYTESUR 1+ (A) 08/13/2019 1359     STUDIES: No results found.   ELIGIBLE FOR AVAILABLE RESEARCH PROTOCOL: AET  ASSESSMENT: 64 y.o. Platter woman status post right breast upper outer quadrant biopsy 11/20/2019 for a clinical mT1b N0, stage IA invasive ductal carcinoma, grade 2 or 3, estrogen and progesterone receptor positive, HER-2 amplified, with an MIB-1 of 20%  (1) the patient opted for initial  surgery  (2) adjuvant chemo immunotherapy will consist of paclitaxel weekly x12 with trastuzumab  (3) trastuzumab to be continued to total 1 year  (4) adjuvant radiation  (5) genetics testing  (6) antiestrogens  PLAN: I met today with Tracy Garrett to review her new diagnosis. Specifically we discussed the biology of her breast cancer, its diagnosis, staging, treatment  options and prognosis. We first reviewed the fact that cancer is not one disease but more than 100 different diseases and that it is important to keep them separate-- otherwise when friends and relatives discuss their own cancer experiences with Tracy Garrett confusion can result. Similarly we explained that if breast cancer spreads to the bone or liver, the patient would not have bone cancer or liver cancer, but breast cancer in the bone and breast cancer in the liver: one cancer in three places-- not 3 different cancers which otherwise would have to be treated in 3 different ways.  We discussed the difference between local and systemic therapy. In terms of loco-regional treatment, lumpectomy plus radiation is equivalent to mastectomy as far as survival is concerned. For this reason, and because the cosmetic results are generally superior, we recommend breast conserving surgery.  We also noted that in terms of sequencing of treatments, whether systemic therapy or surgery is done first does not affect the ultimate outcome.  We considered the pros and cons of neoadjuvant treatment in this case.  The patient prefers to avoid 2 surgeries (1 for port placement and 1 for lumpectomy) and opted instead for initial surgery.  I am very comfortable with that decision as I would have been with the alternative  We then discussed the rationale for systemic therapy. There is some risk that this cancer may have already spread to other parts of her body. Patients frequently ask at this point about bone  scans, CAT scans and PET scans to find out if they have  occult breast cancer somewhere else. The problem is that in early stage disease we are much more likely to find false positives then true cancers and this would expose the patient to unnecessary procedures as well as unnecessary radiation. Scans cannot answer the question the patient really would like to know, which is whether she has microscopic disease elsewhere in her body. For those reasons we do not recommend them.  Of course we would proceed to aggressive evaluation of any symptoms that might suggest metastatic disease, but that is not the case here.  Next we went over the options for systemic therapy which are anti-estrogens, anti-HER-2 immunotherapy, and chemotherapy. Tracy Garrett meets criteria for all 3 and specifically she will receive paclitaxel weekly x12 together with trastuzumab followed by trastuzumab alone to total 1 year.  We had an initial discussion of the possible toxicities side effects and complications of these agents and she will meet with our chemotherapy teaching nurse for further instructions.  She will need an echocardiogram and port placement.  We are hopeful to start treatment the last week in July  She also qualifies for genetics testing. In patients who carry a deleterious mutation [for example in a  BRCA gene], the risk of a new breast cancer developing in the future may be sufficiently great that the patient may choose bilateral mastectomies. However if she wishes to keep her breasts in that situation it is safe to do so. That would require intensified screening, which generally means not only yearly mammography but a yearly breast MRI as well. Of course, if there is a deleterious mutation bilateral oophorectomy would be necessary as there is no standard screening protocol for ovarian cancer.  Jaslen has a good understanding of the overall plan. She agrees with it. She knows the goal of treatment in her case is cure. She will call with any problems that may develop before  her next visit here.  Total encounter time 65 minutes.Tracy Garrett Jews C. Von Inscoe, MD 11/28/2019 9:45 PM Medical Oncology and Hematology [facility] [place], [place] [address] Tel. [phone]    Fax. [phone]   This document serves as a record of services personally performed by Lurline Del, MD. It was created on his behalf by Wilburn Mylar, a trained medical scribe. The creation of this record is based on the scribe's personal observations and the provider's statements to them.   I, Lurline Del MD, have reviewed the above documentation for accuracy and completeness, and I agree with the above.    *Total Encounter Time as defined by the Centers for Medicare and Medicaid Services includes, in addition to the face-to-face time of a patient visit (documented in the note above) non-face-to-face time: obtaining and reviewing outside history, ordering and reviewing medications, tests or procedures, care coordination (communications with other health care professionals or caregivers) and documentation in the medical record.

## 2019-11-28 ENCOUNTER — Other Ambulatory Visit: Payer: Self-pay | Admitting: Adult Health Nurse Practitioner

## 2019-11-28 ENCOUNTER — Other Ambulatory Visit: Payer: Self-pay | Admitting: *Deleted

## 2019-11-28 ENCOUNTER — Telehealth: Payer: Self-pay | Admitting: *Deleted

## 2019-11-28 ENCOUNTER — Encounter: Payer: Self-pay | Admitting: *Deleted

## 2019-11-28 ENCOUNTER — Other Ambulatory Visit: Payer: Self-pay | Admitting: Adult Health

## 2019-11-28 ENCOUNTER — Inpatient Hospital Stay: Payer: 59 | Attending: Oncology | Admitting: Oncology

## 2019-11-28 ENCOUNTER — Other Ambulatory Visit: Payer: Self-pay

## 2019-11-28 ENCOUNTER — Ambulatory Visit
Admission: RE | Admit: 2019-11-28 | Discharge: 2019-11-28 | Disposition: A | Discharge status: Home / Self Care | Payer: 59 | Source: Ambulatory Visit | Attending: Radiation Oncology | Admitting: Radiation Oncology

## 2019-11-28 ENCOUNTER — Inpatient Hospital Stay: Payer: 59

## 2019-11-28 ENCOUNTER — Ambulatory Visit: Payer: Self-pay | Admitting: Surgery

## 2019-11-28 ENCOUNTER — Ambulatory Visit (HOSPITAL_BASED_OUTPATIENT_CLINIC_OR_DEPARTMENT_OTHER): Payer: 59 | Admitting: Genetic Counselor

## 2019-11-28 VITALS — BP 154/89 | HR 80 | Temp 98.9°F | Resp 20 | Ht 63.5 in | Wt 205.3 lb

## 2019-11-28 DIAGNOSIS — C50411 Malignant neoplasm of upper-outer quadrant of right female breast: Secondary | ICD-10-CM | POA: Insufficient documentation

## 2019-11-28 DIAGNOSIS — Z170 Estrogen receptor positive status [ER+]: Secondary | ICD-10-CM | POA: Diagnosis not present

## 2019-11-28 DIAGNOSIS — Z803 Family history of malignant neoplasm of breast: Secondary | ICD-10-CM

## 2019-11-28 DIAGNOSIS — Z8616 Personal history of COVID-19: Secondary | ICD-10-CM

## 2019-11-28 DIAGNOSIS — I10 Essential (primary) hypertension: Secondary | ICD-10-CM

## 2019-11-28 DIAGNOSIS — Z90710 Acquired absence of both cervix and uterus: Secondary | ICD-10-CM | POA: Diagnosis not present

## 2019-11-28 DIAGNOSIS — C50911 Malignant neoplasm of unspecified site of right female breast: Secondary | ICD-10-CM

## 2019-11-28 DIAGNOSIS — D3A010 Benign carcinoid tumor of the duodenum: Secondary | ICD-10-CM

## 2019-11-28 DIAGNOSIS — Z809 Family history of malignant neoplasm, unspecified: Secondary | ICD-10-CM | POA: Diagnosis not present

## 2019-11-28 DIAGNOSIS — C50811 Malignant neoplasm of overlapping sites of right female breast: Secondary | ICD-10-CM | POA: Diagnosis not present

## 2019-11-28 DIAGNOSIS — Z807 Family history of other malignant neoplasms of lymphoid, hematopoietic and related tissues: Secondary | ICD-10-CM

## 2019-11-28 DIAGNOSIS — N390 Urinary tract infection, site not specified: Secondary | ICD-10-CM

## 2019-11-28 DIAGNOSIS — B9620 Unspecified Escherichia coli [E. coli] as the cause of diseases classified elsewhere: Secondary | ICD-10-CM

## 2019-11-28 DIAGNOSIS — E785 Hyperlipidemia, unspecified: Secondary | ICD-10-CM

## 2019-11-28 DIAGNOSIS — E1169 Type 2 diabetes mellitus with other specified complication: Secondary | ICD-10-CM

## 2019-11-28 DIAGNOSIS — N181 Chronic kidney disease, stage 1: Secondary | ICD-10-CM

## 2019-11-28 DIAGNOSIS — E1165 Type 2 diabetes mellitus with hyperglycemia: Secondary | ICD-10-CM

## 2019-11-28 LAB — CBC WITH DIFFERENTIAL/PLATELET
Absolute Monocytes: 808 cells/uL (ref 200–950)
Basophils Absolute: 38 cells/uL (ref 0–200)
Basophils Relative: 0.4 %
Eosinophils Absolute: 216 cells/uL (ref 15–500)
Eosinophils Relative: 2.3 %
HCT: 40.3 % (ref 35.0–45.0)
Hemoglobin: 13.8 g/dL (ref 11.7–15.5)
Lymphs Abs: 2858 cells/uL (ref 850–3900)
MCH: 30.1 pg (ref 27.0–33.0)
MCHC: 34.2 g/dL (ref 32.0–36.0)
MCV: 87.8 fL (ref 80.0–100.0)
MPV: 10.5 fL (ref 7.5–12.5)
Monocytes Relative: 8.6 %
Neutro Abs: 5480 cells/uL (ref 1500–7800)
Neutrophils Relative %: 58.3 %
Platelets: 292 10*3/uL (ref 140–400)
RBC: 4.59 10*6/uL (ref 3.80–5.10)
RDW: 13.8 % (ref 11.0–15.0)
Total Lymphocyte: 30.4 %
WBC: 9.4 10*3/uL (ref 3.8–10.8)

## 2019-11-28 LAB — COMPLETE METABOLIC PANEL WITH GFR
AG Ratio: 1.5 (calc) (ref 1.0–2.5)
ALT: 12 U/L (ref 6–29)
AST: 11 U/L (ref 10–35)
Albumin: 4.4 g/dL (ref 3.6–5.1)
Alkaline phosphatase (APISO): 71 U/L (ref 37–153)
BUN: 14 mg/dL (ref 7–25)
CO2: 26 mmol/L (ref 20–32)
Calcium: 9.7 mg/dL (ref 8.6–10.4)
Chloride: 103 mmol/L (ref 98–110)
Creat: 0.82 mg/dL (ref 0.50–0.99)
GFR, Est African American: 88 mL/min/{1.73_m2} (ref >=60)
GFR, Est Non African American: 76 mL/min/{1.73_m2} (ref >=60)
Globulin: 3 g/dL (calc) (ref 1.9–3.7)
Glucose, Bld: 96 mg/dL (ref 65–99)
Potassium: 3.8 mmol/L (ref 3.5–5.3)
Sodium: 140 mmol/L (ref 135–146)
Total Bilirubin: 0.5 mg/dL (ref 0.2–1.2)
Total Protein: 7.4 g/dL (ref 6.1–8.1)

## 2019-11-28 LAB — HEMOGLOBIN A1C
Hgb A1c MFr Bld: 6.5 % of total Hgb — ABNORMAL HIGH (ref <5.7)
Mean Plasma Glucose: 140 (calc)
eAG (mmol/L): 7.7 (calc)

## 2019-11-28 LAB — CBC WITH DIFFERENTIAL (CANCER CENTER ONLY)
Abs Immature Granulocytes: 0.02 10*3/uL (ref 0.00–0.07)
Basophils Absolute: 0 10*3/uL (ref 0.0–0.1)
Basophils Relative: 1 %
Eosinophils Absolute: 0.3 10*3/uL (ref 0.0–0.5)
Eosinophils Relative: 3 %
HCT: 40.7 % (ref 36.0–46.0)
Hemoglobin: 13.3 g/dL (ref 12.0–15.0)
Immature Granulocytes: 0 %
Lymphocytes Relative: 26 %
Lymphs Abs: 2.3 10*3/uL (ref 0.7–4.0)
MCH: 30.1 pg (ref 26.0–34.0)
MCHC: 32.7 g/dL (ref 30.0–36.0)
MCV: 92.1 fL (ref 80.0–100.0)
Monocytes Absolute: 0.8 10*3/uL (ref 0.1–1.0)
Monocytes Relative: 9 %
Neutro Abs: 5.4 10*3/uL (ref 1.7–7.7)
Neutrophils Relative %: 61 %
Platelet Count: 267 10*3/uL (ref 150–400)
RBC: 4.42 MIL/uL (ref 3.87–5.11)
RDW: 14.2 % (ref 11.5–15.5)
WBC Count: 8.8 10*3/uL (ref 4.0–10.5)
nRBC: 0 % (ref 0.0–0.2)

## 2019-11-28 LAB — URINE CULTURE
MICRO NUMBER:: 10618525
SPECIMEN QUALITY:: ADEQUATE

## 2019-11-28 LAB — URINALYSIS W MICROSCOPIC + REFLEX CULTURE
Bilirubin Urine: NEGATIVE
Hgb urine dipstick: NEGATIVE
Ketones, ur: NEGATIVE
Nitrites, Initial: NEGATIVE
Specific Gravity, Urine: 1.019 (ref 1.001–1.03)
pH: <=5 (ref 5.0–8.0)

## 2019-11-28 LAB — LIPID PANEL
Cholesterol: 216 mg/dL — ABNORMAL HIGH (ref <200)
HDL: 67 mg/dL (ref >=50)
LDL Cholesterol (Calc): 124 mg/dL (calc) — ABNORMAL HIGH
Non-HDL Cholesterol (Calc): 149 mg/dL (calc) — ABNORMAL HIGH (ref <130)
Total CHOL/HDL Ratio: 3.2 (calc) (ref <5.0)
Triglycerides: 139 mg/dL (ref <150)

## 2019-11-28 LAB — CMP (CANCER CENTER ONLY)
ALT: 16 U/L (ref 0–44)
AST: 10 U/L — ABNORMAL LOW (ref 15–41)
Albumin: 3.9 g/dL (ref 3.5–5.0)
Alkaline Phosphatase: 75 U/L (ref 38–126)
Anion gap: 9 (ref 5–15)
BUN: 11 mg/dL (ref 8–23)
CO2: 26 mmol/L (ref 22–32)
Calcium: 9.2 mg/dL (ref 8.9–10.3)
Chloride: 106 mmol/L (ref 98–111)
Creatinine: 0.94 mg/dL (ref 0.44–1.00)
GFR, Est AFR Am: >60 mL/min (ref >60)
GFR, Estimated: >60 mL/min (ref >60)
Glucose, Bld: 125 mg/dL — ABNORMAL HIGH (ref 70–99)
Potassium: 3.6 mmol/L (ref 3.5–5.1)
Sodium: 141 mmol/L (ref 135–145)
Total Bilirubin: 0.6 mg/dL (ref 0.3–1.2)
Total Protein: 7.6 g/dL (ref 6.5–8.1)

## 2019-11-28 LAB — IRON, TOTAL/TOTAL IRON BINDING CAP
%SAT: 14 % (calc) — ABNORMAL LOW (ref 16–45)
Iron: 54 ug/dL (ref 45–160)
TIBC: 378 mcg/dL (calc) (ref 250–450)

## 2019-11-28 LAB — MAGNESIUM: Magnesium: 2 mg/dL (ref 1.5–2.5)

## 2019-11-28 LAB — VITAMIN D 25 HYDROXY (VIT D DEFICIENCY, FRACTURES): Vit D, 25-Hydroxy: 45 ng/mL (ref 30–100)

## 2019-11-28 LAB — VITAMIN B12: Vitamin B-12: >2000 pg/mL — ABNORMAL HIGH (ref 200–1100)

## 2019-11-28 LAB — CULTURE INDICATED

## 2019-11-28 LAB — TSH: TSH: 1.13 mIU/L (ref 0.40–4.50)

## 2019-11-28 LAB — GENETIC SCREENING ORDER

## 2019-11-28 MED ORDER — ROSUVASTATIN CALCIUM 5 MG PO TABS: ORAL_TABLET | ORAL | 1 refills | Status: DC

## 2019-11-28 MED ORDER — SULFAMETHOXAZOLE-TRIMETHOPRIM 800-160 MG PO TABS: 1.0000 | ORAL_TABLET | Freq: Two times a day (BID) | ORAL | 0 refills | Status: DC

## 2019-11-28 NOTE — Progress Notes (Signed)
START ON PATHWAY REGIMEN - Breast     Cycle 1: A cycle is 7 days:     Trastuzumab-xxxx      Paclitaxel    Cycles 2 through 12: A cycle is every 7 days:     Trastuzumab-xxxx      Paclitaxel    Cycles 13 through 25: A cycle is every 21 days:     Trastuzumab-xxxx   **Always confirm dose/schedule in your pharmacy ordering system**  Patient Characteristics: Postoperative without Neoadjuvant Therapy (Pathologic Staging), Invasive Disease, Adjuvant Therapy, HER2 Positive, ER Positive, Node Negative, pT1b, pN0/N64m, Chemotherapy Indicated Therapeutic Status: Postoperative without Neoadjuvant Therapy (Pathologic Staging) AJCC Grade: G3 AJCC N Category: pN0 AJCC M Category: cM0 ER Status: Positive (+) AJCC 8 Stage Grouping: IA HER2 Status: Positive (+) Oncotype Dx Recurrence Score: Not Appropriate AJCC T Category: pT1b PR Status: Positive (+) Intervention Indicated: Chemotherapy Intent of Therapy: Curative Intent, Discussed with Patient

## 2019-11-28 NOTE — Progress Notes (Signed)
REFERRING PROVIDER: Chauncey Cruel, MD [address] [place],  [place] [address]  PRIMARY PROVIDER:  Unk Pinto, MD  PRIMARY REASON FOR VISIT:  No diagnosis found.   I connected with Tracy Garrett on 11/28/2019 at 3:00 pm EDT by video conference and verified that I am speaking with the correct person using two identifiers.   Patient location: [facility] clinic Provider location: [facility] office  HISTORY OF PRESENT ILLNESS:   Tracy Garrett, a 64 y.o. female, was seen for a [place] cancer genetics consultation at the request of Dr. Jana Hakim due to a personal and family history of cancer.  Tracy Garrett presents to clinic today to discuss the possibility of a hereditary predisposition to cancer, genetic testing, and to further clarify her future cancer risks, as well as potential cancer risks for family members.   In June 2021, at the age of 24, Tracy Garrett was diagnosed with triple positive invasive ductal carcinoma of the right breast. The treatment plan includes surgery, adjuvant chemotherapy and immunotherapy, adjuvant radiation, and antiestrogens.    RISK FACTORS:  Menarche was at age 18.  First live birth at age 89.  OCP use for approximately 0 years.  Ovaries intact: yes.  Hysterectomy: yes.  Menopausal status: postmenopausal.  HRT use: 0 years. Colonoscopy: yes; 02/2019. Mammogram within the last year: yes.   Past Medical History:  Diagnosis Date  . Allergy   . Cataracts, bilateral   . Fatty liver disease, nonalcoholic 10/22/6158   [place] appears normal on follow up US 09/2019  . History of kidney stones    x2 episodes  . Hyperlipidemia   . Hypertension   . PONV (postoperative nausea and vomiting)   . Sleep apnea    test was dx, as mild- no further tx. required  . Type II or unspecified type diabetes mellitus without mention of complication, not stated as uncontrolled     Past Surgical History:  Procedure Laterality Date  . BREAST SURGERY  1999   Reduction  Mammoplasties  . CHOLECYSTECTOMY  2003  . colonoscopsy  11/2007   neg- polyp removal with both procedures x2  . CYSTOSCOPY     surgery to extract stone x 1 with stent  . EUS N/A 07/26/2014   Procedure: UPPER ENDOSCOPIC ULTRASOUND (EUS) LINEAR;  Surgeon: Beryle Beams, MD;  Location: [facility];  Service: Endoscopy;  Laterality: N/A;  . 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 LYSIS OF ADHESION  10/10/2014   Procedure: LYSIS OF ADHESION;  Surgeon: TJackolyn Confer MD;  Location: [facility] OR[facility];  Service: General;;  . TONSILLECTOMY     and adenoids removed age 64 . VAGINAL HYSTERECTOMY  2002    Social History   Socioeconomic History  . Marital status: Married    Spouse name: Not on file  . Number of children: 2  . Years of education: 4 years of college  . Highest education level: Some college, no degree  Occupational History  . Not on file  Tobacco Use  . Smoking status: Never Smoker  . Smokeless tobacco: Never Used  Vaping Use  . Vaping Use: Never used  Substance and Sexual Activity  . Alcohol use: No  . Drug use: No  . Sexual activity: Not Currently    Partners: Male    Birth control/protection: Post-menopausal  Other Topics Concern  . Not on  file  Social History Narrative   Lives at home with her husband and youngest son   Right handed   Drinks occasional tea, not daily   Social Determinants of Health   Financial Resource Strain:   . Difficulty of Paying Living Expenses:   Food Insecurity:   . Worried About Charity fundraiser in the Last Year:   . Arboriculturist in the Last Year:   Transportation Needs:   . Film/video editor (Medical):   Marland Kitchen Lack of Transportation (Non-Medical):   Physical Activity: Insufficiently Active  . Days of Exercise per Week: 3 days  . Minutes of Exercise per Session: 30 min  Stress:  Stress Concern Present  . Feeling of Stress : To some extent  Social Connections:   . Frequency of Communication with Friends and Family:   . Frequency of Social Gatherings with Friends and Family:   . Attends Religious Services:   . Active Member of Clubs or Organizations:   . Attends Archivist Meetings:   Marland Kitchen Marital Status:      FAMILY HISTORY:  We obtained a detailed, 4-generation family history.  Significant diagnoses are listed below: Family History  Problem Relation Age of Onset  . Breast cancer Mother 42  . Heart disease Father        smoker  . Diabetes type I Sister   . Alzheimer's disease Maternal Grandmother   . Emphysema Paternal Grandfather        smoker  . Cancer Paternal Grandfather   . Heart attack Paternal Grandfather   . CAD Brother 56       stents  . Breast cancer Maternal Aunt   . Breast cancer Cousin    Tracy Garrett has two sons (ages 22 and 47). She has two brothers (ages 50 and 102) and one maternal half-sister who died at the age of 35. None of these family members have had cancer.  Tracy Garrett's mother died at the age of 64 and was diagnosed with breast cancer at the age of 73. Tracy Garrett had three maternal aunts and two maternal uncles. One aunt was diagnosed with breast cancer in her late 68s. Another aunt died from multiple myeloma at the age of 64. One of her uncles had cancer that developed as a tumor on the back of his neck diagnosed in his 54s. She has two maternal cousins who were diagnosed with breast cancer. Her maternal grandmother died at the age of 19 and did not have cancer, and she has limited information about her maternal grandfather.  Tracy Garrett's father died at the age of 63 and did not have cancer. She had two paternal aunts. One was diagnosed with breast cancer when she was older than 68, and the other was diagnosed with multiple myeloma when she was older than 4. Her paternal grandmother died at the age of 81 without  cancer, and her paternal grandfather died in his 91s or 26s from lung cancer.   Tracy Garrett is unaware of previous family history of genetic testing for hereditary cancer risks. Patient's maternal ancestors are of unknown descent, and paternal ancestors are of unknown descent. There is no reported Ashkenazi Jewish ancestry. There is no known consanguinity.  GENETIC COUNSELING ASSESSMENT: Ms. Stiner is a 64 y.o. female with a personal and family history of breast cancer, which is somewhat suggestive of a hereditary cancer syndrome and predisposition to cancer. We, therefore, discussed and recommended the following at today's visit.  DISCUSSION: We discussed that 5-10% of breast cancer is hereditary, with most cases associated with the BRCA genes. There are other genes that can be associated with hereditary breast cancer syndromes. These include ATM, CHEK2, PALB2, etc. We discussed that testing is beneficial for several reasons, including knowing about other cancer risks, identifying potential screening and risk-reduction options that may be appropriate, and to understand if other family members could be at risk for cancer and allow them to undergo genetic testing.  We reviewed the characteristics, features and inheritance patterns of hereditary cancer syndromes. We also discussed genetic testing, including the appropriate family members to test, the process of testing, insurance coverage and turn-around-time for results. We discussed the implications of a negative, positive and/or variant of uncertain significant result. In order to get genetic test results in a timely manner so that Ms. Apsey can use these genetic test results for surgical decisions, we recommended Ms. Himes pursue genetic testing for the [facility] Breast Cancer STAT panel. Once complete, we recommend Ms. Marsolek pursue reflex genetic testing to the Common Hereditary Cancers panel.   The Breast Cancer STAT Panel offered by [facility]  includes sequencing and deletion/duplication analysis for the following 9 genes:  ATM, BRCA1, BRCA2, CDH1, CHEK2, PALB2, PTEN, STK11 and TP53. The Common Hereditary Cancers Panel offered by [facility] includes sequencing and/or deletion duplication testing of the following 48 genes: APC, ATM, AXIN2, BARD1, BMPR1A, BRCA1, BRCA2, BRIP1, CDH1, CDK4, CDKN2A (p14ARF), CDKN2A (p16INK4a), CHEK2, CTNNA1, DICER1, EPCAM (Deletion/duplication testing only), GREM1 (promoter region deletion/duplication testing only), KIT, MEN1, MLH1, MSH2, MSH3, MSH6, MUTYH, NBN, NF1, NHTL1, PALB2, PDGFRA, PMS2, POLD1, POLE, PTEN, RAD50, RAD51C, RAD51D, RNF43, SDHB, SDHC, SDHD, SMAD4, SMARCA4. STK11, TP53, TSC1, TSC2, and VHL.  The following genes were evaluated for sequence changes only: SDHA and HOXB13 c.251G>A variant only.  Based on Ms. Sutherlin's personal and family history of cancer, she meets medical criteria for genetic testing. Despite that she meets criteria, she may still have an out of pocket cost.   PLAN: After considering the risks, benefits, and limitations, Ms. Blackley provided informed consent to pursue genetic testing and the blood sample was sent to [facility] for analysis of the Breast Cancer STAT panel + Common Hereditary Cancers panel. Results should be available within approximately one-two weeks' time, at which point they will be disclosed by telephone to Ms. Faith, as will any additional recommendations warranted by these results. Ms. Markin will receive a summary of her genetic counseling visit and a copy of her results once available. This information will also be available in Epic.   Ms. Feigel questions were answered to her satisfaction today. Our contact information was provided should additional questions or concerns arise. Thank you for the referral and allowing [place] to share in the care of your patient.   Clint Guy, [place], [facility] Licensed, Certified [email or web address] Phone: [phone]  The patient was seen for a total of 20 minutes in face-to-face genetic counseling.  This patient was discussed with Drs. Magrinat, Lindi Adie and/or Burr Medico who agrees with the above.    _______________________________________________________________________ For Office Staff:  Number of people involved in session: 1 Was an Intern/ student involved with case: no

## 2019-11-28 NOTE — H&P (Signed)
Tracy Garrett Appointment: 11/28/2019 1:00 PM Location: [facility] Patient #: [id number] DOB: 08/06/1955 Married / Language: English / Race: Black or African American Female  History of Present Illness (Klair Leising A. 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  The patient is a 63 year old female.   Past Surgical History (Wendy Smith, RN; 11/28/2019 8:14 AM) Breast Biopsy Right. Breast Reconstruction Bilateral. Gallbladder Surgery - Laparoscopic Hysterectomy (not due to cancer) - Partial Oral Surgery Tonsillectomy  Diagnostic Studies History (Wendy Smith, RN; 11/28/2019 8:14 AM) Colonoscopy within last year Mammogram within last year Pap Smear 1-5 years ago >5 years ago  Medication History (Wendy Smith, RN; 11/28/2019 8:14 AM) Medications Reconciled  Social History (Wendy Smith, RN; 11/28/2019 8:14 AM) No alcohol use No caffeine use No drug use Tobacco use Never smoker.  Family History (Wendy Smith, RN; 11/28/2019 8:14 AM) Breast Cancer Mother. Diabetes Mellitus Brother, Sister. Heart Disease Brother. Hypertension Father, Mother. Kidney Disease Brother.  Pregnancy / Birth History (Wendy Smith, RN; 11/28/2019 8:14 AM) Age at menarche 10 years. Gravida 2 Maternal age 21-25 Para 2 Regular periods  Other Problems (Wendy Smith, RN; 11/28/2019 8:14 AM) Arthritis Back Pain Cancer Cholelithiasis Diabetes Mellitus Gastroesophageal Reflux Disease General anesthesia - complications High blood pressure Hypercholesterolemia Kidney Stone Lump In Breast Transfusion history     Review of Systems (Janaia Kozel A. Axtyn Woehler MD; 11/28/2019 2:45 PM) General Not Present- Appetite Loss, Chills, Fatigue, Fever, Night  Sweats, Weight Gain and Weight Loss. Skin Not Present- Change in Wart/Mole, Dryness, Hives, Jaundice, New Lesions, Non-Healing Wounds, Rash and Ulcer. HEENT Present- Seasonal Allergies and Wears glasses/contact lenses. Not Present- Earache, Hearing Loss, Hoarseness, Nose Bleed, Oral Ulcers, Ringing in the Ears, Sinus Pain, Sore Throat, Visual Disturbances and Yellow Eyes. Respiratory Not Present- Bloody sputum, Chronic Cough, Difficulty Breathing, Snoring and Wheezing. Breast Present- Breast Pain. Not Present- Breast Mass, Nipple Discharge and Skin Changes. Cardiovascular Not Present- Chest Pain, Difficulty Breathing Lying Down, Leg Cramps, Palpitations, Rapid Heart Rate, Shortness of Breath and Swelling of Extremities. Gastrointestinal Not Present- Abdominal Pain, Bloating, Bloody Stool, Change in Bowel Habits, Chronic diarrhea, Constipation, Difficulty Swallowing, Excessive gas, Gets full quickly at meals, Hemorrhoids, Indigestion, Nausea, Rectal Pain and Vomiting. Female Genitourinary Not Present- Frequency, Nocturia, Painful Urination, Pelvic Pain and Urgency. Musculoskeletal Present- Back Pain. Not Present- Joint Pain, Joint Stiffness, Muscle Pain, Muscle Weakness and Swelling of Extremities. Neurological Not Present- Decreased Memory, Fainting, Headaches, Numbness, Seizures, Tingling, Tremor, Trouble walking and Weakness. Psychiatric Not Present- Anxiety, Bipolar, Change in Sleep Pattern, Depression, Fearful and Frequent crying. Endocrine Present- New Diabetes. Not Present- Cold Intolerance, Excessive Hunger, Hair Changes, Heat Intolerance and Hot flashes. Hematology Not Present- Blood Thinners, Easy Bruising, Excessive bleeding, Gland problems, HIV and Persistent Infections. All other systems negative   Physical Exam (Caia Lofaro A. Merlen Gurry MD; 11/28/2019 2:47 PM)  General Mental Status-Alert. General Appearance-Consistent with stated age. Hydration-Well  hydrated. Voice-Normal.  Head and Neck Head-normocephalic, atraumatic with no lesions or palpable masses. Trachea-midline. Thyroid Gland Characteristics - normal size and consistency.  Eye Eyeball - Bilateral-Extraocular movements intact. Sclera/Conjunctiva - Bilateral-No scleral icterus.  Chest and Lung Exam Chest and lung exam reveals -quiet, even and easy respiratory effort with no use of accessory muscles and on auscultation, normal breath sounds, no adventitious sounds and   normal vocal resonance. Inspection Chest Wall - Normal. Back - normal.  Breast Breast - Left-Symmetric, Non Tender, No Biopsy scars, no Dimpling - Left, No Inflammation, No Lumpectomy scars, No Mastectomy scars, No Peau d' Orange. Breast - Right-Symmetric, Non Tender, No Biopsy scars, no Dimpling - Right, No Inflammation, No Lumpectomy scars, No Mastectomy scars, No Peau d' Orange. Breast Lump-No Palpable Breast Mass.  Cardiovascular Cardiovascular examination reveals -normal heart sounds, regular rate and rhythm with no murmurs and normal pedal pulses bilaterally.  Abdomen Inspection Inspection of the abdomen reveals - No Hernias. Skin - Scar - no surgical scars. Palpation/Percussion Palpation and Percussion of the abdomen reveal - Soft, Non Tender, No Rebound tenderness, No Rigidity (guarding) and No hepatosplenomegaly. Auscultation Auscultation of the abdomen reveals - Bowel sounds normal.  Neurologic Neurologic evaluation reveals -alert and oriented x 3 with no impairment of recent or remote memory. Mental Status-Normal.  Musculoskeletal Normal Exam - Left-Upper Extremity Strength Normal and Lower Extremity Strength Normal. Normal Exam - Right-Upper Extremity Strength Normal and Lower Extremity Strength Normal.  Lymphatic Head & Neck  General Head & Neck Lymphatics: Bilateral - Description - Normal. Axillary  General Axillary Region: Bilateral -  Description - Normal. Tenderness - Non Tender. Femoral & Inguinal  Generalized Femoral & Inguinal Lymphatics: Bilateral - Description - Normal. Tenderness - Non Tender.    Assessment & Plan (Sammye Staff A. Reinaldo Helt MD; 11/28/2019 2:49 PM)  BREAST CANCER, STAGE 1, RIGHT (C50.911) Impression: triple positive opted for right breast lumpectomy / SLN / PORT PLACEMENT Risk of lumpectomy include bleeding, infection, seroma, more surgery, use of seed/wire, wound care, cosmetic deformity and the need for other treatments, death , blood clots, death. Pt agrees to proceed. Risk of sentinel lymph node mapping include bleeding, infection, lymphedema, shoulder pain. stiffness, dye allergy. cosmetic deformity , blood clots, death, need for more surgery. Pt agrees to proceed. Pt requires port placement for chemotherapy. Risk include bleeding, infection, pneumothorax, hemothorax, mediastinal injury, nerve injury , blood vessel injury, stroke, blood clots, death, migration. embolization and need for additional procedures. Pt agrees to proceed.  Current Plans You are being scheduled for surgery- Our schedulers will call you.  You should hear from our office's scheduling department within 5 working days about the location, date, and time of surgery. We try to make accommodations for patient's preferences in scheduling surgery, but sometimes the OR schedule or the surgeon's schedule prevents us from making those accommodations.  If you have not heard from our office ([phone]) in 5 working days, call the office and ask for your surgeon's nurse.  If you have other questions about your diagnosis, plan, or surgery, call the office and ask for your surgeon's nurse.  We discussed the staging and pathophysiology of breast cancer. We discussed all of the different options for treatment for breast cancer including surgery, chemotherapy, radiation therapy, Herceptin, and antiestrogen therapy. We discussed a  sentinel lymph node biopsy as she does not appear to having lymph node involvement right now. We discussed the performance of that with injection of radioactive tracer and blue dye. We discussed that she would have an incision underneath her axillary hairline. We discussed that there is a bout a 10-20% chance of having a positive node with a sentinel lymph node biopsy and we will await the permanent pathology to make any other first further decisions in terms of her treatment. One of these options might be to return to the operating room to perform an axillary lymph node dissection. We   discussed about a 1-2% risk lifetime of chronic shoulder pain as well as lymphedema associated with a sentinel lymph node biopsy. We discussed the options for treatment of the breast cancer which included lumpectomy versus a mastectomy. We discussed the performance of the lumpectomy with a wire placement. We discussed a 10-20% chance of a positive margin requiring reexcision in the operating room. We also discussed that she may need radiation therapy or antiestrogen therapy or both if she undergoes lumpectomy. We discussed the mastectomy and the postoperative care for that as well. We discussed that there is no difference in her survival whether she undergoes lumpectomy with radiation therapy or antiestrogen therapy versus a mastectomy. There is a slight difference in the local recurrence rate being 3-5% with lumpectomy and about 1% with a mastectomy. We discussed the risks of operation including bleeding, infection, possible reoperation. She understands her further therapy will be based on what her stages at the time of her operation.  Pt Education - [facility] breast cancer surgery: discussed with patient and provided information. Pt Education - [facility] Breast Biopsy HCI: discussed with patient and provided information. 

## 2019-11-28 NOTE — Progress Notes (Signed)
Radiation Oncology         [phone] ________________________________  Initial outpatient Consultation  Name: Tracy Garrett MRN: [id number]  Date: 11/28/2019  DOB: Feb 07, 1956  CC:Unk Pinto, MD  Erroll Luna, MD   REFERRING PHYSICIAN: Erroll Luna, MD  DIAGNOSIS:    ICD-10-CM   1. Malignant neoplasm of upper-outer quadrant of right breast in female, estrogen receptor positive ([place])  C50.411    Z17.0    Clinical Stage IA Right Breast UOQ Invasive Ductal Carcinoma, ER+ / PR+ / Her2+, Grade 2-3 Cancer Staging Malignant neoplasm of upper-outer quadrant of right breast in female, estrogen receptor positive ([place]) Staging form: Breast, AJCC 8th Edition - Clinical stage from 11/28/2019: Stage IA (cT1b, cN0, cM0, G3, ER+, PR+, HER2+) - Unsigned   CHIEF COMPLAINT: Here to discuss management of right breast cancer  HISTORY OF PRESENT ILLNESS::Tracy Garrett is a 64 y.o. female who presented with breast abnormality on the following imaging: short-term follow up diagnostic mammogram on the date of 11/13/2019.  Symptoms, if any, at that time, were: none.   Ultrasound of breast on the same day revealed: 0.8 cm irregular mass in right breast at 9 o'clock; right upper outer quadrant calcifications spanning 5 cm. Axilla negative clinically.   Biopsy on date of 11/20/2019 showed  1. Breast, right, needle core biopsy, UOQ, post depth - FOCI OF INVASIVE AND IN SITU MAMMARY CARCINOMA.   - RARE DYSTROPHIC CALCIFICATIONS 2. Breast, right, needle core biopsy, 9 o'clock, 12cmfn - INVASIVE MAMMARY CARCINOMA.     ER status: 95%; PR status 20%, Her2 status positive by FISH; Grade 2-3.  She is leaning towards breast conserving surgery, then chemotherapy, per a recent consultation with Dr. Brantley Stage. She will undergo MRI of her breasts, first. She is here with her husband.   PREVIOUS RADIATION THERAPY: No  PAST MEDICAL HISTORY:  has a past medical history of Allergy, Cataracts,  bilateral, Family history of breast cancer, Family history of multiple myeloma, Fatty liver disease, nonalcoholic (4/43/1540), History of kidney stones, Hyperlipidemia, Hypertension, PONV (postoperative nausea and vomiting), Sleep apnea, and Type II or unspecified type diabetes mellitus without mention of complication, not stated as uncontrolled.    PAST SURGICAL HISTORY: Past Surgical History:  Procedure Laterality Date  . BREAST SURGERY  1999   Reduction Mammoplasties  . CHOLECYSTECTOMY  2003  . colonoscopsy  11/2007   neg- polyp removal with both procedures x2  . CYSTOSCOPY     surgery to extract stone x 1 with stent  . EUS N/A 07/26/2014   Procedure: UPPER ENDOSCOPIC ULTRASOUND (EUS) LINEAR;  Surgeon: Beryle Beams, MD;  Location: [facility];  Service: Endoscopy;  Laterality: N/A;  . LAPAROTOMY N/A 10/10/2014   Procedure: EXPLORATORY LAPAROTOMY EXCISION OF 46m DUODENAL CARCINOID TUMOR;  Surgeon: TJackolyn Confer MD;  Location: [facility];  Service: General;  Laterality: N/A;  . LYSIS OF ADHESION  10/10/2014   Procedure: LYSIS OF ADHESION;  Surgeon: TJackolyn Confer MD;  Location: [facility];  Service: General;;  . TONSILLECTOMY     and adenoids removed age 89 . VAGINAL HYSTERECTOMY  2002    FAMILY HISTORY: family history includes Alzheimer's disease in her maternal grandmother; Breast cancer in her maternal aunt and paternal aunt; Breast cancer (age of onset: 557 in her cousin; Breast cancer (age of onset: 537 in her cousin; Breast cancer (age of onset: 782 in her mother; CAD (age of onset: 548 in her brother; Cancer in her maternal uncle; Diabetes type I in  her half-sister; Emphysema in her paternal grandfather; Heart attack in her paternal grandfather; Heart disease in her father; Lung cancer in her paternal grandfather; Multiple myeloma in her paternal aunt; Multiple myeloma (age of onset: 41) in her maternal aunt.  SOCIAL HISTORY:  reports that she has never smoked. She has never used smokeless  tobacco. She reports that she does not drink alcohol and does not use drugs.  ALLERGIES: Hydromorphone and Other  MEDICATIONS:  Current Outpatient Medications  Medication Sig Dispense Refill  . acetaminophen (TYLENOL) 500 MG tablet Take 1,000 mg by mouth every 6 (six) hours as needed for mild pain. (Patient not taking: Reported on 11/28/2019)    . ALPRAZolam (XANAX) 1 MG tablet Take 1 tablet 1 hour prior to procedure.  Then take 1 tablet right before procedure if needed 2 tablet 0  . Cholecalciferol (VITAMIN D3) 5000 UNITS TABS Take 1 tablet by mouth daily.     . diphenhydrAMINE (BENADRYL) 25 mg capsule Take 25 mg by mouth every 6 (six) hours as needed for itching. (Patient not taking: Reported on 11/28/2019)    . gabapentin (NEURONTIN) 300 MG capsule Take 1 tablet 3 x day for pain (Patient not taking: Reported on 11/28/2019) 90 capsule 2  . glucose blood (CONTOUR NEXT TEST) test strip Check blood sugar 1 time daily-DX-E11.9 100 each 12  . hydrocortisone cream 1 % Apply 1 application topically 3 (three) times daily as needed for itching. (Patient not taking: Reported on 11/28/2019)    . Ibuprofen (ADVIL PO) Take by mouth as needed. (Patient not taking: Reported on 11/28/2019)    . losartan (COZAAR) 50 MG tablet Take 1/2-1 tab daily for blood pressure and to protect kidneys. 90 tablet 1  . metFORMIN (GLUCOPHAGE XR) 500 MG 24 hr tablet Take 1 tablet (500 mg total) by mouth daily with breakfast. 30 tablet 11  . Omega-3 Fatty Acids (OMEGA 3 PO) Take 1 capsule by mouth 2 (two) times daily.    Marland Kitchen OVER THE COUNTER MEDICATION Takes CoQ10 1 capsule daily    . pantoprazole (PROTONIX) 20 MG tablet Take 20 mg by mouth daily.    . rosuvastatin (CRESTOR) 5 MG tablet Take 1 tablet daily for Cholesterol 90 tablet 1  . sulfamethoxazole-trimethoprim (BACTRIM DS) 800-160 MG tablet Take 1 tablet by mouth 2 (two) times daily. 10 tablet 0   No current facility-administered medications for this encounter.    REVIEW OF  SYSTEMS: As above.   PHYSICAL EXAM:  vitals were not taken for this visit.   General: Alert and oriented, in no acute distress. Musculoskeletal: symmetric strength and muscle tone throughout. Neurologic: Cranial nerves II through XII are grossly intact. No obvious focalities. Speech is fluent. Coordination is intact. Psychiatric: Judgment and insight are intact. Affect is appropriate. Breasts: palpable swelling at biopsy sites, right breast - no clear palpable tumor . No other palpable masses appreciated in the breasts or axillae bilaterally .   ECOG = 0  0 - Asymptomatic (Fully active, able to carry on all predisease activities without restriction)  1 - Symptomatic but completely ambulatory (Restricted in physically strenuous activity but ambulatory and able to carry out work of a light or sedentary nature. For example, light housework, office work)  2 - Symptomatic, <50% in bed during the day (Ambulatory and capable of all self care but unable to carry out any work activities. Up and about more than 50% of waking hours)  3 - Symptomatic, >50% in bed, but not bedbound (Capable of  only limited self-care, confined to bed or chair 50% or more of waking hours)  4 - Bedbound (Completely disabled. Cannot carry on any self-care. Totally confined to bed or chair)  5 - Death   Eustace Pen MM, Creech RH, Tormey DC, et al. [phone]). "Toxicity and response criteria of the [facility] Group". [place] Oncol. 5 (6): 649-55   LABORATORY DATA:  Lab Results  Component Value Date   WBC 8.8 11/28/2019   HGB 13.3 11/28/2019   HCT 40.7 11/28/2019   MCV 92.1 11/28/2019   PLT 267 11/28/2019   CMP     Component Value Date/Time   NA 141 11/28/2019 1217   K 3.6 11/28/2019 1217   CL 106 11/28/2019 1217   CO2 26 11/28/2019 1217   GLUCOSE 125 (H) 11/28/2019 1217   BUN 11 11/28/2019 1217   CREATININE 0.94 11/28/2019 1217   CREATININE 0.82 11/26/2019 1533   CALCIUM 9.2 11/28/2019 1217    PROT 7.6 11/28/2019 1217   ALBUMIN 3.9 11/28/2019 1217   AST 10 (L) 11/28/2019 1217   ALT 16 11/28/2019 1217   ALKPHOS 75 11/28/2019 1217   BILITOT 0.6 11/28/2019 1217   GFRNONAA >60 11/28/2019 1217   GFRNONAA 76 11/26/2019 1533   GFRAA >60 11/28/2019 1217   GFRAA 88 11/26/2019 1533         RADIOGRAPHY: as above     IMPRESSION/PLAN: Right Breast Cancer   She has been discussed at our multidisciplinary tumor board.  The consensus is that she would be a good candidate for breast conservation. I talked to her about the option of a mastectomy and informed her that her expected overall survival would be equivalent between mastectomy and breast conservation, based upon randomized controlled data. She is enthusiastic about breast conservation.  Genetics counseling is pending.  It was a pleasure meeting the patient today. We discussed the risks, benefits, and side effects of radiotherapy. I recommend radiotherapy to the right breast to reduce her risk of locoregional recurrence by 2/3.  We discussed that radiation would take approximately 4 weeks to complete and that I would give the patient a few weeks to heal following surgery before starting treatment planning. Chemotherapy will  precede radiotherapy. Herceptin can overlap radiotherapy, however, and this is anticipate for a year.We spoke about acute effects including skin irritation and fatigue as well as much less common late effects including internal organ injury or irritation. We spoke about the latest technology that is used to minimize the risk of late effects for patients undergoing radiotherapy to the breast or chest wall. No guarantees of treatment were given. The patient is enthusiastic about proceeding with treatment. I look forward to participating in the patient's care.  I will await her referral back to me for postoperative follow-up and eventual CT simulation/treatment planning.  On date of service, in total, I spent 50 minutes on  this encounter. She was seen in person. __________________________________________   Eppie Gibson, MD   This document serves as a record of services personally performed by Eppie Gibson, MD. It was created on her behalf by Wilburn Mylar, a trained medical scribe. The creation of this record is based on the scribe's personal observations and the provider's statements to them. This document has been checked and approved by the attending provider.

## 2019-11-28 NOTE — Telephone Encounter (Signed)
error 

## 2019-11-29 ENCOUNTER — Encounter: Payer: Self-pay | Admitting: Genetic Counselor

## 2019-11-29 ENCOUNTER — Encounter: Payer: Self-pay | Admitting: Radiation Oncology

## 2019-11-29 ENCOUNTER — Other Ambulatory Visit: Payer: Self-pay | Admitting: *Deleted

## 2019-11-29 DIAGNOSIS — Z803 Family history of malignant neoplasm of breast: Secondary | ICD-10-CM | POA: Insufficient documentation

## 2019-11-29 DIAGNOSIS — Z807 Family history of other malignant neoplasms of lymphoid, hematopoietic and related tissues: Secondary | ICD-10-CM | POA: Insufficient documentation

## 2019-11-29 MED ORDER — ALPRAZOLAM 1 MG PO TABS: ORAL_TABLET | ORAL | 0 refills | Status: DC

## 2019-12-01 ENCOUNTER — Ambulatory Visit (HOSPITAL_COMMUNITY)
Admission: RE | Admit: 2019-12-01 | Discharge: 2019-12-01 | Disposition: A | Discharge status: Home / Self Care | Payer: 59 | Source: Ambulatory Visit | Attending: Surgery | Admitting: Surgery

## 2019-12-01 ENCOUNTER — Other Ambulatory Visit: Payer: Self-pay

## 2019-12-01 ENCOUNTER — Encounter (HOSPITAL_COMMUNITY): Payer: Self-pay

## 2019-12-01 DIAGNOSIS — C50411 Malignant neoplasm of upper-outer quadrant of right female breast: Secondary | ICD-10-CM

## 2019-12-01 DIAGNOSIS — Z170 Estrogen receptor positive status [ER+]: Secondary | ICD-10-CM

## 2019-12-01 MED ORDER — GADOBUTROL 1 MMOL/ML IV SOLN: 9.0000 mL | Freq: Once | INTRAVENOUS | Status: DC | PRN

## 2019-12-01 NOTE — Progress Notes (Signed)
Patient arrived for Breast MRI, patients breasts do not fully fit into Breast Coil. Patient will need to be scheduled at [facility] or [facility] for larger breast coil. [facility] does not do Breast imaging on the weekends as I called to see if she could be done there this weekend.

## 2019-12-03 ENCOUNTER — Other Ambulatory Visit: Payer: Self-pay | Admitting: Surgery

## 2019-12-03 DIAGNOSIS — Z170 Estrogen receptor positive status [ER+]: Secondary | ICD-10-CM

## 2019-12-04 ENCOUNTER — Telehealth: Payer: Self-pay | Admitting: *Deleted

## 2019-12-04 ENCOUNTER — Encounter: Payer: Self-pay | Admitting: *Deleted

## 2019-12-04 NOTE — Telephone Encounter (Signed)
Left message to follow up from [facility] and assess navigation needs.

## 2019-12-06 ENCOUNTER — Encounter: Payer: Self-pay | Admitting: *Deleted

## 2019-12-06 NOTE — Progress Notes (Signed)
Spoke with patient to discuss possible starting on AI since there may be a delay in her sx.Marland Kitchen Her Surgery is now scheduled for 7/28. Told patient I would call her back and let her know.   Per Mendel Ryder is up to her if she wants to start now or wait.  Left message letting her know this and to call me back if she wants to start anastrozole

## 2019-12-07 ENCOUNTER — Telehealth: Payer: Self-pay | Admitting: Oncology

## 2019-12-07 NOTE — Telephone Encounter (Signed)
Called pt per 7/1 sch message - pt aware of appts being adjusted.

## 2019-12-11 ENCOUNTER — Ambulatory Visit: Payer: Self-pay | Admitting: Surgery

## 2019-12-11 DIAGNOSIS — C50911 Malignant neoplasm of unspecified site of right female breast: Secondary | ICD-10-CM

## 2019-12-11 NOTE — H&P (Signed)
Patient met with plastic surgery and opted against reconstruction.  She decided to undergo bilateral simple mastectomy with right axillary sentinel lymph node mapping and port placement.  She is deferred reconstruction for now. 

## 2019-12-11 NOTE — H&P (View-Only) (Signed)
Patient met with plastic surgery and opted against reconstruction.  She decided to undergo bilateral simple mastectomy with right axillary sentinel lymph node mapping and port placement.  She is deferred reconstruction for now. 

## 2019-12-13 ENCOUNTER — Ambulatory Visit (HOSPITAL_COMMUNITY): Payer: 59

## 2019-12-13 ENCOUNTER — Other Ambulatory Visit: Payer: Self-pay

## 2019-12-13 ENCOUNTER — Ambulatory Visit (HOSPITAL_COMMUNITY)
Admission: RE | Admit: 2019-12-13 | Discharge: 2019-12-13 | Disposition: A | Discharge status: Home / Self Care | Payer: 59 | Source: Ambulatory Visit | Attending: Oncology | Admitting: Oncology

## 2019-12-13 DIAGNOSIS — C50411 Malignant neoplasm of upper-outer quadrant of right female breast: Secondary | ICD-10-CM

## 2019-12-13 DIAGNOSIS — Z170 Estrogen receptor positive status [ER+]: Secondary | ICD-10-CM | POA: Diagnosis not present

## 2019-12-13 DIAGNOSIS — G4730 Sleep apnea, unspecified: Secondary | ICD-10-CM | POA: Diagnosis not present

## 2019-12-13 DIAGNOSIS — E785 Hyperlipidemia, unspecified: Secondary | ICD-10-CM | POA: Diagnosis not present

## 2019-12-13 DIAGNOSIS — E119 Type 2 diabetes mellitus without complications: Secondary | ICD-10-CM | POA: Diagnosis not present

## 2019-12-13 DIAGNOSIS — I10 Essential (primary) hypertension: Secondary | ICD-10-CM | POA: Diagnosis not present

## 2019-12-13 DIAGNOSIS — I351 Nonrheumatic aortic (valve) insufficiency: Secondary | ICD-10-CM | POA: Diagnosis not present

## 2019-12-13 NOTE — Progress Notes (Signed)
Echocardiogram 2D Echocardiogram has been performed.  Oneal Deputy Cecylia Brazill 12/13/2019, 9:42 AM

## 2019-12-16 ENCOUNTER — Encounter: Payer: Self-pay | Admitting: Dietician

## 2019-12-16 NOTE — Progress Notes (Signed)
Nutrition  Patient identified after attending Breast Clinic on 11/28/2019. Patient given nutrition packet with RD contact information by nurse navigator at that time.  Chart reviewed.   Patient with malignant neoplasm of upper-outer quadrant of right breast, estrogen receptor positive. Plans for patient opted initial surgery followed by adjuvant immunotherapy consisting of paclitaxel weekly x 12 with trastuzumab continued for total of 1 year, adjuvant radiation.  Patient is not currently at nutrition risk. Please consult RD if nutrition issues arise.  Lajuan Lines, RD, LDN Clinical Nutrition After Hours/Weekend Pager # in [place]

## 2019-12-17 ENCOUNTER — Ambulatory Visit: Payer: Self-pay | Admitting: Genetic Counselor

## 2019-12-17 ENCOUNTER — Encounter: Payer: Self-pay | Admitting: Genetic Counselor

## 2019-12-17 ENCOUNTER — Telehealth: Payer: Self-pay | Admitting: Genetic Counselor

## 2019-12-17 DIAGNOSIS — Z1379 Encounter for other screening for genetic and chromosomal anomalies: Secondary | ICD-10-CM | POA: Insufficient documentation

## 2019-12-17 NOTE — Telephone Encounter (Signed)
Revealed negative genetic testing. Discussed that we do not know why she has breast cancer or why there is cancer in the family. There could be a genetic mutation in the family that Tracy Garrett did not inherit. There could also be a mutation in a different gene that we are not testing, or our current technology may not be able detect certain mutations. It will therefore be important for her to stay in contact with genetics to keep up with whether additional testing may be appropriate in the future.   Two variants of uncertain significance were detected - one in the BRIP1 gene called c.1735C>T and a second in the POLD1 gene called c.2861C>G. Her result is still considered normal at this time and should not impact her medical management.

## 2019-12-17 NOTE — Progress Notes (Signed)
HPI:  Tracy Garrett was previously seen in the [place] clinic due to a personal and family history of cancer and concerns regarding a hereditary predisposition to cancer. Please refer to our prior cancer genetics clinic note for more information regarding our discussion, assessment and recommendations, at the time. Tracy Garrett recent genetic test results were disclosed to her, as were recommendations warranted by these results. These results and recommendations are discussed in more detail below.  CANCER HISTORY:  Oncology History  Malignant neoplasm of upper-outer quadrant of right breast in female, estrogen receptor positive ([place])  11/22/2019 Initial Diagnosis   Malignant neoplasm of upper-outer quadrant of right breast in female, estrogen receptor positive ([place])   12/16/2019 Genetic Testing   Negative genetic testing:  No pathogenic variants detected on the [facility] Breast Cancer STAT panel + Common Hereditary Cancers panel. Two variants of uncertain significance (VUS) were detected - one in the BRIP1 gene called c.1735C>T and a second in the POLD1 gene called c.2861C>G. The report date is 12/16/2019.  The Breast Cancer STAT Panel offered by [facility] includes sequencing and deletion/duplication analysis for the following 9 genes:  ATM, BRCA1, BRCA2, CDH1, CHEK2, PALB2, PTEN, STK11 and TP53. The Common Hereditary Cancers Panel offered by [facility] includes sequencing and/or deletion duplication testing of the following 48 genes: APC, ATM, AXIN2, BARD1, BMPR1A, BRCA1, BRCA2, BRIP1, CDH1, CDK4, CDKN2A (p14ARF), CDKN2A (p16INK4a), CHEK2, CTNNA1, DICER1, EPCAM (Deletion/duplication testing only), GREM1 (promoter region deletion/duplication testing only), KIT, MEN1, MLH1, MSH2, MSH3, MSH6, MUTYH, NBN, NF1, NTHL1, PALB2, PDGFRA, PMS2, POLD1, POLE, PTEN, RAD50, RAD51C, RAD51D, RNF43, SDHB, SDHC, SDHD, SMAD4, SMARCA4. STK11, TP53, TSC1, TSC2, and VHL.  The following genes were evaluated for  sequence changes only: SDHA and HOXB13 c.251G>A variant only.   01/03/2020 -  Chemotherapy   The patient had PACLitaxel (TAXOL) 162 mg in sodium chloride 0.9 % 250 mL chemo infusion (</= 69m/m2), 80 mg/m2, Intravenous,  Once, 0 of 3 cycles trastuzumab-dkst (OGIVRI) 378 mg in sodium chloride 0.9 % 250 mL chemo infusion, 4 mg/kg, Intravenous,  Once, 0 of 16 cycles  for chemotherapy treatment.      FAMILY HISTORY:  We obtained a detailed, 4-generation family history.  Significant diagnoses are listed below: Family History  Problem Relation Age of Onset  . Breast cancer Mother 755 . Heart disease Father        smoker  . Diabetes type I Half-Sister   . Alzheimer's disease Maternal Grandmother   . Emphysema Paternal Grandfather        smoker  . Heart attack Paternal Grandfather   . Lung cancer Paternal Grandfather   . CAD Brother 584      stents  . Breast cancer Maternal Aunt        dx. in her late 745s . Breast cancer Cousin 557 . Cancer Maternal Uncle        dx. in his 63s tumor on the back of neck  . Breast cancer Paternal Aunt        dx. >50  . Multiple myeloma Maternal Aunt 92  . Multiple myeloma Paternal Aunt        dx. >50  . Breast cancer Cousin 532   Tracy Garrett has two sons (ages 499and 226. She has two brothers (ages 664and 542 and one maternal half-sister who died at the age of 62 None of these family members have had cancer.  Tracy Garrett's mother died at the age of  88 and was diagnosed with breast cancer at the age of 46. Tracy Garrett had three maternal aunts and two maternal uncles. One aunt was diagnosed with breast cancer in her late 70s. Another aunt died from multiple myeloma at the age of 70. One of her uncles had cancer that developed as a tumor on the back of his neck diagnosed in his 49s. She has two maternal cousins who were diagnosed with breast cancer. Her maternal grandmother died at the age of 86 and did not have cancer, and she has limited information  about her maternal grandfather.  Tracy Garrett's father died at the age of 55 and did not have cancer. She had two paternal aunts. One was diagnosed with breast cancer when she was older than 82, and the other was diagnosed with multiple myeloma when she was older than 27. Her paternal grandmother died at the age of 71 without cancer, and her paternal grandfather died in his 71s or 7s from lung cancer.   Tracy Garrett is unaware of previous family history of genetic testing for hereditary cancer risks. Patient's maternal ancestors are of unknown descent, and paternal ancestors are of unknown descent. There is no reported Ashkenazi Jewish ancestry. There is no known consanguinity.  GENETIC TEST RESULTS: Genetic testing reported out on 12/16/2019 through the [facility] Breast Cancer STAT panel + Common Hereditary Cancers panel. No pathogenic variants were detected.   The Breast Cancer STAT Panel offered by [facility] includes sequencing and deletion/duplication analysis for the following 9 genes:  ATM, BRCA1, BRCA2, CDH1, CHEK2, PALB2, PTEN, STK11 and TP53. The Common Hereditary Cancers Panel offered by [facility] includes sequencing and/or deletion duplication testing of the following 48 genes: APC, ATM, AXIN2, BARD1, BMPR1A, BRCA1, BRCA2, BRIP1, CDH1, CDK4, CDKN2A (p14ARF), CDKN2A (p16INK4a), CHEK2, CTNNA1, DICER1, EPCAM (Deletion/duplication testing only), GREM1 (promoter region deletion/duplication testing only), KIT, MEN1, MLH1, MSH2, MSH3, MSH6, MUTYH, NBN, NF1, NTHL1, PALB2, PDGFRA, PMS2, POLD1, POLE, PTEN, RAD50, RAD51C, RAD51D, RNF43, SDHB, SDHC, SDHD, SMAD4, SMARCA4. STK11, TP53, TSC1, TSC2, and VHL.  The following genes were evaluated for sequence changes only: SDHA and HOXB13 c.251G>A variant only. The test report will be scanned into EPIC and located under the Molecular Pathology section of the Results Review tab.  A portion of the result report is included below for reference.     We discussed with  Tracy Garrett that because current genetic testing is not perfect, it is possible there may be a gene mutation in one of these genes that current testing cannot detect, but that chance is small.  We also discussed that there could be another gene that has not yet been discovered, or that we have not yet tested, that is responsible for the cancer diagnoses in the family. It is also possible there is a hereditary cause for the cancer in the family that Tracy Garrett did not inherit and therefore was not identified in her testing.  Therefore, it is important to remain in touch with cancer genetics in the future so that we can continue to offer Tracy Garrett the most up to date genetic testing.   Genetic testing did identify two variants of uncertain significance (VUS) - one in the BRIP1 gene called c.1735C>T and a second in the POLD1 gene called c.2861C>G.  At this time, it is unknown if these variants are associated with increased cancer risk or if they are normal findings, but most variants such as these get reclassified to being inconsequential. They should not be used to make  medical management decisions. With time, we suspect the lab will determine the significance of these variants, if any. If we do learn more about them, we will try to contact Tracy Garrett to discuss it further. However, it is important to stay in touch with [place] periodically and keep the address and phone number up to date.  CANCER SCREENING RECOMMENDATIONS: Tracy Garrett test result is considered negative (normal).  This means that we have not identified a hereditary cause for her personal and family history of cancer at this time. While reassuring, this does not definitively rule out a hereditary predisposition to cancer. It is still possible that there could be genetic mutations that are undetectable by current technology. There could be genetic mutations in genes that have not been tested or identified to increase cancer risk.  Therefore,  it is recommended she continue to follow the cancer management and screening guidelines provided by her oncology and primary healthcare providers.   An individual's cancer risk and medical management are not determined by genetic test results alone. Overall cancer risk assessment incorporates additional factors, including personal medical history, family history, and any available genetic information that may result in a personalized plan for cancer prevention and surveillance.  RECOMMENDATIONS FOR FAMILY MEMBERS:  Individuals in this family might be at some increased risk of developing cancer, over the general population risk, simply due to the family history of cancer.  We recommended women in this family have a yearly mammogram beginning at age 53, or 9 years younger than the earliest onset of cancer, an annual clinical breast exam, and perform monthly breast self-exams. Women in this family should also have a gynecological exam as recommended by their primary provider. All family members should be referred for colonoscopy starting at age 64.  It is also possible there is a hereditary cause for the cancer in Tracy Garrett's family that she did not inherit and therefore was not identified in her.  Based on Tracy Garrett's family history, we recommended her maternal first cousin, who was diagnosed with breast cancer at age 74, have genetic counseling and testing. Tracy Garrett will let us know if we can be of any assistance in coordinating genetic counseling and/or testing for this family member.   FOLLOW-UP: Lastly, we discussed with Tracy Garrett that cancer genetics is a rapidly advancing field and it is possible that new genetic tests will be appropriate for her and/or her family members in the future. We encouraged her to remain in contact with cancer genetics on an annual basis so we can update her personal and family histories and let her know of advances in cancer genetics that may benefit this family.    Our contact number was provided. Tracy Garrett questions were answered to her satisfaction, and she knows she is welcome to call us at anytime with additional questions or concerns.   Clint Guy, MS, [facility] Genetic Counselor [email or web address] Phone: [phone]

## 2019-12-17 NOTE — Telephone Encounter (Signed)
LVM that her genetic test results are available and requested that she call back to discuss them.  

## 2019-12-19 ENCOUNTER — Encounter: Payer: Self-pay | Admitting: *Deleted

## 2019-12-25 NOTE — Pre-Procedure Instructions (Signed)
[id number] [place], [place] [address] [place] [address] Phone: [phone] Fax: [phone]      Your procedure is scheduled on Wednesday July 28th.  Report to [facility] Main Entrance "A" at 11 A.M., and check in at the Admitting office.  Call this number if you have problems the morning of surgery:  [phone]  Call [phone] if you have any questions prior to your surgery date Monday-Friday 8am-4pm    Remember:  Do not eat after midnight the night before your surgery  You may drink clear liquids until 10am  the morning of your surgery.   Clear liquids allowed are: Water, Non-Citrus Juices (without pulp), Carbonated Beverages, Clear Tea, Black Coffee Only, and Gatorade    Take these medicines the morning of surgery with A SIP OF WATER   cetirizine (ZYRTEC) 10 MG tablet  pantoprazole (PROTONIX) 40 MG tablet  rosuvastatin (CRESTOR) 5 MG tablet   WHAT DO I DO ABOUT MY DIABETES MEDICATION?   Marland Kitchen Do not take oral diabetes medicines (pills) the morning of surgery.  . If your CBG is greater than 220 mg/dL, you may take  of your sliding scale (correction) dose of insulin.   HOW TO MANAGE YOUR DIABETES BEFORE AND AFTER SURGERY  Why is it important to control my blood sugar before and after surgery? . Improving blood sugar levels before and after surgery helps healing and can limit problems. . A way of improving blood sugar control is eating a healthy diet by: o  Eating less sugar and carbohydrates o  Increasing activity/exercise o  Talking with your doctor about reaching your blood sugar goals . High blood sugars (greater than 180 mg/dL) can raise your risk of infections and slow your recovery, so you will need to focus on controlling your diabetes during the weeks before surgery. . Make sure that the doctor who takes care of your diabetes knows about your planned surgery including the  date and location.  How do I manage my blood sugar before surgery? . Check your blood sugar at least 4 times a day, starting 2 days before surgery, to make sure that the level is not too high or low. . Check your blood sugar the morning of your surgery when you wake up and every 2 hours until you get to the Short Stay unit. o If your blood sugar is less than 70 mg/dL, you will need to treat for low blood sugar: - Do not take insulin. - Treat a low blood sugar (less than 70 mg/dL) with  cup of clear juice (cranberry or apple), 4 glucose tablets, OR glucose gel. - Recheck blood sugar in 15 minutes after treatment (to make sure it is greater than 70 mg/dL). If your blood sugar is not greater than 70 mg/dL on recheck, call [phone] for further instructions. . Report your blood sugar to the short stay nurse when you get to Short Stay.  . If you are admitted to the hospital after surgery: o Your blood sugar will be checked by the staff and you will probably be given insulin after surgery (instead of oral diabetes medicines) to make sure you have good blood sugar levels. o The goal for blood sugar control after surgery is 80-180 mg/dL.   As of today, STOP taking any Aspirin (unless otherwise instructed by your surgeon) Aleve, Naproxen, Ibuprofen, Motrin, Advil, Goody's, BC's, all herbal medications, fish oil, and all vitamins.  Do not wear jewelry, make up, or nail polish            Do not wear lotions, powders, perfumes, or deodorant.            Do not shave 48 hours prior to surgery.              Do not bring valuables to the hospital.            [facility] is not responsible for any belongings or valuables.  Do NOT Smoke (Tobacco/Vaping) or drink Alcohol 24 hours prior to your procedure If you use a CPAP at night, you may bring all equipment for your overnight stay.   Contacts, glasses, dentures or bridgework may not be worn into surgery.      For patients admitted  to the hospital, discharge time will be determined by your treatment team.   Patients discharged the day of surgery will not be allowed to drive home, and someone needs to stay with them for 24 hours.    Special instructions:   [facility]- Preparing For Surgery  Before surgery, you can play an important role. Because skin is not sterile, your skin needs to be as free of germs as possible. You can reduce the number of germs on your skin by washing with CHG (chlorahexidine gluconate) Soap before surgery.  CHG is an antiseptic cleaner which kills germs and bonds with the skin to continue killing germs even after washing.    Oral Hygiene is also important to reduce your risk of infection.  Remember - BRUSH YOUR TEETH THE MORNING OF SURGERY WITH YOUR REGULAR TOOTHPASTE  Please do not use if you have an allergy to CHG or antibacterial soaps. If your skin becomes reddened/irritated stop using the CHG.  Do not shave (including legs and underarms) for at least 48 hours prior to first CHG shower. It is OK to shave your face.  Please follow these instructions carefully.   1. Shower the NIGHT BEFORE SURGERY and the MORNING OF SURGERY with CHG Soap.   2. If you chose to wash your hair, wash your hair first as usual with your normal shampoo.  3. After you shampoo, rinse your hair and body thoroughly to remove the shampoo.  4. Use CHG as you would any other liquid soap. You can apply CHG directly to the skin and wash gently with a scrungie or a clean washcloth.   5. Apply the CHG Soap to your body ONLY FROM THE NECK DOWN.  Do not use on open wounds or open sores. Avoid contact with your eyes, ears, mouth and genitals (private parts). Wash Face and genitals (private parts)  with your normal soap.   6. Wash thoroughly, paying special attention to the area where your surgery will be performed.  7. Thoroughly rinse your body with warm water from the neck down.  8. DO NOT shower/wash with your normal  soap after using and rinsing off the CHG Soap.  9. Pat yourself dry with a CLEAN TOWEL.  10. Wear CLEAN PAJAMAS to bed the night before surgery  11. Place CLEAN SHEETS on your bed the night of your first shower and DO NOT SLEEP WITH PETS.   Day of Surgery: Wear Clean/Comfortable clothing the morning of surgery Do not apply any deodorants/lotions.   Remember to brush your teeth WITH YOUR REGULAR TOOTHPASTE.   Please read over the following fact sheets that you were given.

## 2019-12-26 ENCOUNTER — Encounter (HOSPITAL_COMMUNITY): Payer: Self-pay

## 2019-12-26 ENCOUNTER — Encounter (HOSPITAL_COMMUNITY)
Admission: RE | Admit: 2019-12-26 | Discharge: 2019-12-26 | Disposition: A | Discharge status: Home / Self Care | Payer: 59 | Source: Ambulatory Visit | Attending: Surgery | Admitting: Surgery

## 2019-12-26 ENCOUNTER — Other Ambulatory Visit: Payer: Self-pay

## 2019-12-26 DIAGNOSIS — Z01812 Encounter for preprocedural laboratory examination: Secondary | ICD-10-CM | POA: Insufficient documentation

## 2019-12-26 HISTORY — DX: Anemia, unspecified: D64.9

## 2019-12-26 HISTORY — DX: Chronic kidney disease, unspecified: N18.9

## 2019-12-26 HISTORY — DX: Malignant (primary) neoplasm, unspecified: C80.1

## 2019-12-26 HISTORY — DX: Gastro-esophageal reflux disease without esophagitis: K21.9

## 2019-12-26 HISTORY — DX: Unspecified osteoarthritis, unspecified site: M19.90

## 2019-12-26 LAB — BASIC METABOLIC PANEL
Anion gap: 11 (ref 5–15)
BUN: 12 mg/dL (ref 8–23)
CO2: 27 mmol/L (ref 22–32)
Calcium: 9.2 mg/dL (ref 8.9–10.3)
Chloride: 101 mmol/L (ref 98–111)
Creatinine, Ser: 0.87 mg/dL (ref 0.44–1.00)
GFR calc Af Amer: >60 mL/min (ref >60)
GFR calc non Af Amer: >60 mL/min (ref >60)
Glucose, Bld: 100 mg/dL — ABNORMAL HIGH (ref 70–99)
Potassium: 3.7 mmol/L (ref 3.5–5.1)
Sodium: 139 mmol/L (ref 135–145)

## 2019-12-26 LAB — CBC
HCT: 40.9 % (ref 36.0–46.0)
Hemoglobin: 13.4 g/dL (ref 12.0–15.0)
MCH: 30.5 pg (ref 26.0–34.0)
MCHC: 32.8 g/dL (ref 30.0–36.0)
MCV: 93 fL (ref 80.0–100.0)
Platelets: 261 10*3/uL (ref 150–400)
RBC: 4.4 MIL/uL (ref 3.87–5.11)
RDW: 13.9 % (ref 11.5–15.5)
WBC: 9.4 10*3/uL (ref 4.0–10.5)
nRBC: 0 % (ref 0.0–0.2)

## 2019-12-26 LAB — GLUCOSE, CAPILLARY: Glucose-Capillary: 106 mg/dL — ABNORMAL HIGH (ref 70–99)

## 2019-12-26 NOTE — Progress Notes (Signed)
PCP - Unk Pinto, MD Cardiologist - Denies Oncologist- Lurline Del, MD  PPM/ICD - Denies  Chest x-ray - N/A EKG - 01/08/19 Stress Test - Per patient, > 10 years ago, done to r/o CP. Per patient, results were negative. ECHO - 12/13/19 Cardiac Cath - Denies  Sleep Study - Yes, negative for OSA CPAP - No  Fasting Blood Sugar: <100 Checks Blood Sugar 3-4 x weekly. CBG at PAT appointment was 106. Last A1C was 6.5 on 11/26/19.  Blood Thinner Instructions: N/A Aspirin Instructions: Per patient, last dose 12/26/19  ERAS Protcol - Yes PRE-SURGERY Ensure or G2- G2 Given  COVID TEST- 12/29/19   Anesthesia review: Yes, review ECHO.  Patient denies shortness of breath, fever, cough and chest pain at PAT appointment   All instructions explained to the patient, with a verbal understanding of the material. Patient agrees to go over the instructions while at home for a better understanding. Patient also instructed to self quarantine after being tested for COVID-19. The opportunity to ask questions was provided.

## 2019-12-26 NOTE — Pre-Procedure Instructions (Signed)
Your procedure is scheduled on Wednesday July 28th.  Report to [facility] Main Entrance "A" at 11 A.M., and check in at the Admitting office.  Call this number if you have problems the morning of surgery:  [phone]  Call [phone] if you have any questions prior to your surgery date Monday-Friday 8am-4pm    Remember:  Do not eat after midnight the night before your surgery.  You may drink clear liquids until 10:00 AM  the morning of your surgery.   Clear liquids allowed are: Water, Non-Citrus Juices (without pulp), Carbonated Beverages, Clear Tea, Black Coffee Only, and Gatorade.   . The day of surgery (if you have diabetes): o  o Drink ONE (1) Gatorade 2 (G2) by 10:00 AM the morning of surgery. o This drink was given to you during your hospital  pre-op appointment visit.  o Color of the Gatorade may vary. Red is not allowed. o Nothing else to drink after completing the  Gatorade 2 (G2).         If you have questions, please contact your surgeon's office.     Take these medicines the morning of surgery with A SIP OF WATER:  cetirizine (ZYRTEC)   pantoprazole (PROTONIX)   rosuvastatin (CRESTOR)   Follow your surgeon's instructions on when to stop Aspirin.  If no instructions were given by your surgeon then you will need to call the office to get those instructions.    As of today, STOP taking any Aleve, Naproxen, Ibuprofen, Motrin, Advil, Goody's, BC's, all herbal medications, fish oil, and all vitamins.   WHAT DO I DO ABOUT MY DIABETES MEDICATION?  Marland Kitchen Do not take metFORMIN (GLUCOPHAGE XR) the morning of surgery.   HOW TO MANAGE YOUR DIABETES BEFORE AND AFTER SURGERY  Why is it important to control my blood sugar before and after surgery? . Improving blood sugar levels before and after surgery helps healing and can limit problems. . A way of improving blood sugar control is eating a healthy diet by: o  Eating less sugar and carbohydrates o  Increasing  activity/exercise o  Talking with your doctor about reaching your blood sugar goals . High blood sugars (greater than 180 mg/dL) can raise your risk of infections and slow your recovery, so you will need to focus on controlling your diabetes during the weeks before surgery. . Make sure that the doctor who takes care of your diabetes knows about your planned surgery including the date and location.  How do I manage my blood sugar before surgery? . Check your blood sugar at least 4 times a day, starting 2 days before surgery, to make sure that the level is not too high or low. . Check your blood sugar the morning of your surgery when you wake up and every 2 hours until you get to the Short Stay unit. o If your blood sugar is less than 70 mg/dL, you will need to treat for low blood sugar: - Do not take insulin. - Treat a low blood sugar (less than 70 mg/dL) with  cup of clear juice (cranberry or apple), 4 glucose tablets, OR glucose gel. - Recheck blood sugar in 15 minutes after treatment (to make sure it is greater than 70 mg/dL). If your blood sugar is not greater than 70 mg/dL on recheck, call [phone] for further instructions. . Report your blood sugar to the short stay nurse when you get to Short Stay.  . If you are admitted to the hospital after  surgery: o Your blood sugar will be checked by the staff and you will probably be given insulin after surgery (instead of oral diabetes medicines) to make sure you have good blood sugar levels. o The goal for blood sugar control after surgery is 80-180 mg/dL.         The Morning of Surgery:              Do not wear jewelry, make up, or nail polish            Do not wear lotions, powders, perfumes, or deodorant.            Do not shave 48 hours prior to surgery.              Do not bring valuables to the hospital.            [facility] is not responsible for any belongings or valuables.  Do NOT Smoke (Tobacco/Vaping) or drink Alcohol 24  hours prior to your procedure.  If you use a CPAP at night, you may bring all equipment for your overnight stay.   Contacts, glasses, dentures or bridgework may not be worn into surgery.      For patients admitted to the hospital, discharge time will be determined by your treatment team.   Patients discharged the day of surgery will not be allowed to drive home, and someone needs to stay with them for 24 hours.    Special instructions:   [facility]- Preparing For Surgery  Before surgery, you can play an important role. Because skin is not sterile, your skin needs to be as free of germs as possible. You can reduce the number of germs on your skin by washing with CHG (chlorahexidine gluconate) Soap before surgery.  CHG is an antiseptic cleaner which kills germs and bonds with the skin to continue killing germs even after washing.    Oral Hygiene is also important to reduce your risk of infection.  Remember - BRUSH YOUR TEETH THE MORNING OF SURGERY WITH YOUR REGULAR TOOTHPASTE  Please do not use if you have an allergy to CHG or antibacterial soaps. If your skin becomes reddened/irritated stop using the CHG.  Do not shave (including legs and underarms) for at least 48 hours prior to first CHG shower. It is OK to shave your face.  Please follow these instructions carefully.   1. Shower the NIGHT BEFORE SURGERY and the MORNING OF SURGERY with CHG Soap.   2. If you chose to wash your hair, wash your hair first as usual with your normal shampoo.  3. After you shampoo, rinse your hair and body thoroughly to remove the shampoo.  4. Use CHG as you would any other liquid soap. You can apply CHG directly to the skin and wash gently with a scrungie or a clean washcloth.   5. Apply the CHG Soap to your body ONLY FROM THE NECK DOWN.  Do not use on open wounds or open sores. Avoid contact with your eyes, ears, mouth and genitals (private parts). Wash Face and genitals (private parts)  with your  normal soap.   6. Wash thoroughly, paying special attention to the area where your surgery will be performed.  7. Thoroughly rinse your body with warm water from the neck down.  8. DO NOT shower/wash with your normal soap after using and rinsing off the CHG Soap.  9. Pat yourself dry with a CLEAN TOWEL.  10. Wear CLEAN PAJAMAS to bed  the night before surgery  11. Place CLEAN SHEETS on your bed the night of your first shower and DO NOT SLEEP WITH PETS.   Day of Surgery: Wear Clean/Comfortable clothing the morning of surgery Do not apply any deodorants/lotions.   Remember to brush your teeth WITH YOUR REGULAR TOOTHPASTE.   Please read over the following fact sheets that you were given.

## 2019-12-28 ENCOUNTER — Ambulatory Visit: Payer: 59 | Admitting: Oncology

## 2019-12-29 ENCOUNTER — Other Ambulatory Visit (HOSPITAL_COMMUNITY)
Admission: RE | Admit: 2019-12-29 | Discharge: 2019-12-29 | Disposition: A | Discharge status: Home / Self Care | Payer: 59 | Source: Ambulatory Visit | Attending: Surgery | Admitting: Surgery

## 2019-12-29 DIAGNOSIS — Z01812 Encounter for preprocedural laboratory examination: Secondary | ICD-10-CM | POA: Insufficient documentation

## 2019-12-29 DIAGNOSIS — Z20822 Contact with and (suspected) exposure to covid-19: Secondary | ICD-10-CM | POA: Diagnosis not present

## 2019-12-29 LAB — SARS CORONAVIRUS 2 (TAT 6-24 HRS): SARS Coronavirus 2: NEGATIVE

## 2020-01-02 ENCOUNTER — Ambulatory Visit (HOSPITAL_COMMUNITY)
Admission: RE | Admit: 2020-01-02 | Discharge: 2020-01-02 | Disposition: A | Discharge status: Home / Self Care | Payer: 59 | Source: Ambulatory Visit | Attending: Surgery | Admitting: Surgery

## 2020-01-02 ENCOUNTER — Encounter (HOSPITAL_COMMUNITY): Payer: Self-pay | Admitting: Surgery

## 2020-01-02 ENCOUNTER — Other Ambulatory Visit: Payer: Self-pay

## 2020-01-02 ENCOUNTER — Observation Stay (HOSPITAL_COMMUNITY)
Admission: RE | Admit: 2020-01-02 | Discharge: 2020-01-03 | Disposition: A | Discharge status: Home / Self Care | Payer: 59 | Attending: Surgery | Admitting: Surgery

## 2020-01-02 ENCOUNTER — Encounter: Payer: Self-pay | Admitting: Internal Medicine

## 2020-01-02 ENCOUNTER — Ambulatory Visit (HOSPITAL_COMMUNITY): Payer: 59 | Admitting: Physician Assistant

## 2020-01-02 ENCOUNTER — Ambulatory Visit (HOSPITAL_COMMUNITY): Payer: 59

## 2020-01-02 ENCOUNTER — Encounter (HOSPITAL_COMMUNITY)
Admission: RE | Disposition: A | Discharge status: Home / Self Care | Payer: Self-pay | Source: Home / Self Care | Attending: Surgery

## 2020-01-02 ENCOUNTER — Observation Stay (HOSPITAL_COMMUNITY): Payer: 59

## 2020-01-02 ENCOUNTER — Ambulatory Visit (HOSPITAL_COMMUNITY): Payer: 59 | Admitting: Anesthesiology

## 2020-01-02 DIAGNOSIS — C50911 Malignant neoplasm of unspecified site of right female breast: Secondary | ICD-10-CM

## 2020-01-02 DIAGNOSIS — Z419 Encounter for procedure for purposes other than remedying health state, unspecified: Secondary | ICD-10-CM

## 2020-01-02 DIAGNOSIS — Z170 Estrogen receptor positive status [ER+]: Secondary | ICD-10-CM

## 2020-01-02 DIAGNOSIS — Z95828 Presence of other vascular implants and grafts: Secondary | ICD-10-CM

## 2020-01-02 HISTORY — PX: MASTECTOMY W/ SENTINEL NODE BIOPSY: SHX2001

## 2020-01-02 HISTORY — PX: PORTACATH PLACEMENT: SHX2246

## 2020-01-02 LAB — GLUCOSE, CAPILLARY
Glucose-Capillary: 125 mg/dL — ABNORMAL HIGH (ref 70–99)
Glucose-Capillary: 120 mg/dL — ABNORMAL HIGH (ref 70–99)

## 2020-01-02 SURGERY — MASTECTOMY WITH SENTINEL LYMPH NODE BIOPSY
Anesthesia: General | Site: Chest | Laterality: Right

## 2020-01-02 MED ORDER — HEPARIN SOD (PORK) LOCK FLUSH 100 UNIT/ML IV SOLN
INTRAVENOUS | Status: DC | PRN
  Administered 2020-01-02: 500 [IU] via INTRAVENOUS

## 2020-01-02 MED ORDER — SODIUM CHLORIDE 0.9 % IV SOLN
INTRAVENOUS | Status: DC | PRN
  Administered 2020-01-02: 500 mL

## 2020-01-02 MED ORDER — PROPOFOL 500 MG/50ML IV EMUL
INTRAVENOUS | Status: DC | PRN
  Administered 2020-01-02: 50 ug/kg/min via INTRAVENOUS

## 2020-01-02 MED ORDER — SODIUM CHLORIDE 0.9 % IV SOLN
INTRAVENOUS | Status: AC
  Filled 2020-01-02: qty 1.2

## 2020-01-02 MED ORDER — ORAL CARE MOUTH RINSE: 15.0000 mL | Freq: Once | OROMUCOSAL | Status: AC

## 2020-01-02 MED ORDER — FENTANYL CITRATE (PF) 100 MCG/2ML IJ SOLN: 50.0000 ug | INTRAMUSCULAR | Status: DC | PRN

## 2020-01-02 MED ORDER — SODIUM CHLORIDE (PF) 0.9 % IJ SOLN
INTRAVENOUS | Status: DC | PRN
  Administered 2020-01-02: 5 mL via INTRAMUSCULAR

## 2020-01-02 MED ORDER — CHLORHEXIDINE GLUCONATE 0.12 % MT SOLN
15.0000 mL | Freq: Once | OROMUCOSAL | Status: AC
  Administered 2020-01-02: 15 mL via OROMUCOSAL
  Filled 2020-01-02: qty 15

## 2020-01-02 MED ORDER — PROPOFOL 10 MG/ML IV BOLUS
INTRAVENOUS | Status: AC
  Filled 2020-01-02: qty 20

## 2020-01-02 MED ORDER — OXYCODONE-ACETAMINOPHEN 5-325 MG PO TABS
1.0000 | ORAL_TABLET | ORAL | Status: DC | PRN
  Administered 2020-01-02 (×2): 1 via ORAL
  Filled 2020-01-02: qty 1
  Filled 2020-01-02: qty 2

## 2020-01-02 MED ORDER — MIDAZOLAM HCL 2 MG/2ML IJ SOLN: 2.0000 mg | Freq: Once | INTRAMUSCULAR | Status: DC

## 2020-01-02 MED ORDER — CEFAZOLIN SODIUM-DEXTROSE 2-4 GM/100ML-% IV SOLN
2.0000 g | INTRAVENOUS | Status: AC
  Administered 2020-01-02: 2 g via INTRAVENOUS

## 2020-01-02 MED ORDER — SODIUM CHLORIDE (PF) 0.9 % IJ SOLN
INTRAMUSCULAR | Status: DC | PRN
  Administered 2020-01-02: 3 mL

## 2020-01-02 MED ORDER — SUGAMMADEX SODIUM 200 MG/2ML IV SOLN
INTRAVENOUS | Status: DC | PRN
  Administered 2020-01-02: 200 mg via INTRAVENOUS

## 2020-01-02 MED ORDER — ONDANSETRON HCL 4 MG/2ML IJ SOLN
INTRAMUSCULAR | Status: DC | PRN
  Administered 2020-01-02: 4 mg via INTRAVENOUS

## 2020-01-02 MED ORDER — MIDAZOLAM HCL 2 MG/2ML IJ SOLN
INTRAMUSCULAR | Status: AC
  Administered 2020-01-02: 2 mg via INTRAVENOUS
  Filled 2020-01-02: qty 2

## 2020-01-02 MED ORDER — METHYLENE BLUE 0.5 % INJ SOLN
INTRAVENOUS | Status: AC
  Filled 2020-01-02: qty 10

## 2020-01-02 MED ORDER — DIPHENHYDRAMINE HCL 12.5 MG/5ML PO ELIX: 12.5000 mg | ORAL_SOLUTION | Freq: Four times a day (QID) | ORAL | Status: DC | PRN

## 2020-01-02 MED ORDER — FENTANYL CITRATE (PF) 100 MCG/2ML IJ SOLN
INTRAMUSCULAR | Status: AC
  Filled 2020-01-02: qty 2

## 2020-01-02 MED ORDER — ENOXAPARIN SODIUM 40 MG/0.4ML ~~LOC~~ SOLN: 40.0000 mg | SUBCUTANEOUS | Status: DC

## 2020-01-02 MED ORDER — GABAPENTIN 300 MG PO CAPS
ORAL_CAPSULE | ORAL | Status: AC
  Filled 2020-01-02: qty 1

## 2020-01-02 MED ORDER — BUPIVACAINE HCL (PF) 0.25 % IJ SOLN
INTRAMUSCULAR | Status: DC | PRN
  Administered 2020-01-02 (×2): 30 mL

## 2020-01-02 MED ORDER — DEXAMETHASONE SODIUM PHOSPHATE 10 MG/ML IJ SOLN
INTRAMUSCULAR | Status: DC | PRN
  Administered 2020-01-02: 4 mg via INTRAVENOUS

## 2020-01-02 MED ORDER — GABAPENTIN 300 MG PO CAPS
300.0000 mg | ORAL_CAPSULE | Freq: Two times a day (BID) | ORAL | Status: DC
  Administered 2020-01-02: 300 mg via ORAL
  Filled 2020-01-02: qty 1

## 2020-01-02 MED ORDER — KCL IN DEXTROSE-NACL 20-5-0.9 MEQ/L-%-% IV SOLN
INTRAVENOUS | Status: DC
  Filled 2020-01-02: qty 1000

## 2020-01-02 MED ORDER — ACETAMINOPHEN 500 MG PO TABS: 1000.0000 mg | ORAL_TABLET | ORAL | Status: AC

## 2020-01-02 MED ORDER — CHLORHEXIDINE GLUCONATE CLOTH 2 % EX PADS: 6.0000 | MEDICATED_PAD | Freq: Once | CUTANEOUS | Status: DC

## 2020-01-02 MED ORDER — TECHNETIUM TC 99M SULFUR COLLOID FILTERED
1.0000 | Freq: Once | INTRAVENOUS | Status: AC | PRN
  Administered 2020-01-02: 1 via INTRADERMAL

## 2020-01-02 MED ORDER — 0.9 % SODIUM CHLORIDE (POUR BTL) OPTIME
TOPICAL | Status: DC | PRN
  Administered 2020-01-02: 1000 mL

## 2020-01-02 MED ORDER — DIPHENHYDRAMINE HCL 50 MG/ML IJ SOLN: 12.5000 mg | Freq: Four times a day (QID) | INTRAMUSCULAR | Status: DC | PRN

## 2020-01-02 MED ORDER — ACETAMINOPHEN 650 MG RE SUPP: 650.0000 mg | Freq: Four times a day (QID) | RECTAL | Status: DC | PRN

## 2020-01-02 MED ORDER — HEMOSTATIC AGENTS (NO CHARGE) OPTIME
TOPICAL | Status: DC | PRN
  Administered 2020-01-02 (×4): 1 via TOPICAL

## 2020-01-02 MED ORDER — OXYCODONE HCL 5 MG PO TABS
5.0000 mg | ORAL_TABLET | Freq: Once | ORAL | Status: AC | PRN
  Administered 2020-01-02: 5 mg via ORAL

## 2020-01-02 MED ORDER — ONDANSETRON HCL 4 MG/2ML IJ SOLN: 4.0000 mg | Freq: Four times a day (QID) | INTRAMUSCULAR | Status: DC | PRN

## 2020-01-02 MED ORDER — METOPROLOL TARTRATE 5 MG/5ML IV SOLN: 5.0000 mg | Freq: Four times a day (QID) | INTRAVENOUS | Status: DC | PRN

## 2020-01-02 MED ORDER — ONDANSETRON HCL 4 MG/2ML IJ SOLN: 4.0000 mg | Freq: Once | INTRAMUSCULAR | Status: DC | PRN

## 2020-01-02 MED ORDER — MIDAZOLAM HCL 2 MG/2ML IJ SOLN: 2.0000 mg | Freq: Once | INTRAMUSCULAR | Status: AC

## 2020-01-02 MED ORDER — OXYCODONE HCL 5 MG/5ML PO SOLN: 5.0000 mg | Freq: Once | ORAL | Status: AC | PRN

## 2020-01-02 MED ORDER — CELECOXIB 200 MG PO CAPS: 200.0000 mg | ORAL_CAPSULE | ORAL | Status: AC

## 2020-01-02 MED ORDER — OXYCODONE HCL 5 MG PO TABS
ORAL_TABLET | ORAL | Status: AC
  Filled 2020-01-02: qty 1

## 2020-01-02 MED ORDER — FENTANYL CITRATE (PF) 100 MCG/2ML IJ SOLN
25.0000 ug | INTRAMUSCULAR | Status: DC | PRN
  Administered 2020-01-02: 50 ug via INTRAVENOUS

## 2020-01-02 MED ORDER — LACTATED RINGERS IV SOLN: INTRAVENOUS | Status: DC

## 2020-01-02 MED ORDER — FENTANYL CITRATE (PF) 100 MCG/2ML IJ SOLN: 100.0000 ug | Freq: Once | INTRAMUSCULAR | Status: AC

## 2020-01-02 MED ORDER — CELECOXIB 200 MG PO CAPS
ORAL_CAPSULE | ORAL | Status: AC
  Administered 2020-01-02: 200 mg via ORAL
  Filled 2020-01-02: qty 1

## 2020-01-02 MED ORDER — ROCURONIUM BROMIDE 10 MG/ML (PF) SYRINGE
PREFILLED_SYRINGE | INTRAVENOUS | Status: DC | PRN
  Administered 2020-01-02: 20 mg via INTRAVENOUS
  Administered 2020-01-02: 60 mg via INTRAVENOUS
  Administered 2020-01-02: 20 mg via INTRAVENOUS

## 2020-01-02 MED ORDER — ACETAMINOPHEN 325 MG PO TABS: 650.0000 mg | ORAL_TABLET | Freq: Four times a day (QID) | ORAL | Status: DC | PRN

## 2020-01-02 MED ORDER — BUPIVACAINE HCL (PF) 0.25 % IJ SOLN
INTRAMUSCULAR | Status: DC | PRN
  Administered 2020-01-02: 10 mL

## 2020-01-02 MED ORDER — GABAPENTIN 300 MG PO CAPS
300.0000 mg | ORAL_CAPSULE | ORAL | Status: AC
  Administered 2020-01-02: 300 mg via ORAL
  Filled 2020-01-02: qty 1

## 2020-01-02 MED ORDER — MENTHOL 3 MG MT LOZG: 1.0000 | LOZENGE | OROMUCOSAL | Status: DC | PRN

## 2020-01-02 MED ORDER — ACETAMINOPHEN 500 MG PO TABS
ORAL_TABLET | ORAL | Status: AC
  Administered 2020-01-02: 1000 mg via ORAL
  Filled 2020-01-02: qty 2

## 2020-01-02 MED ORDER — BUPIVACAINE HCL (PF) 0.25 % IJ SOLN
INTRAMUSCULAR | Status: AC
  Filled 2020-01-02: qty 30

## 2020-01-02 MED ORDER — PROPOFOL 10 MG/ML IV BOLUS
INTRAVENOUS | Status: DC | PRN
  Administered 2020-01-02: 150 mg via INTRAVENOUS

## 2020-01-02 MED ORDER — PHENYLEPHRINE 40 MCG/ML (10ML) SYRINGE FOR IV PUSH (FOR BLOOD PRESSURE SUPPORT)
PREFILLED_SYRINGE | INTRAVENOUS | Status: DC | PRN
  Administered 2020-01-02 (×2): 80 ug via INTRAVENOUS

## 2020-01-02 MED ORDER — FENTANYL CITRATE (PF) 100 MCG/2ML IJ SOLN
INTRAMUSCULAR | Status: AC
  Administered 2020-01-02: 100 ug via INTRAVENOUS
  Filled 2020-01-02: qty 2

## 2020-01-02 MED ORDER — MIDAZOLAM HCL 2 MG/2ML IJ SOLN
INTRAMUSCULAR | Status: AC
  Filled 2020-01-02: qty 2

## 2020-01-02 MED ORDER — HEPARIN SOD (PORK) LOCK FLUSH 100 UNIT/ML IV SOLN
INTRAVENOUS | Status: AC
  Filled 2020-01-02: qty 5

## 2020-01-02 MED ORDER — CELECOXIB 200 MG PO CAPS
200.0000 mg | ORAL_CAPSULE | Freq: Two times a day (BID) | ORAL | Status: DC
  Administered 2020-01-02: 200 mg via ORAL
  Filled 2020-01-02 (×2): qty 1

## 2020-01-02 MED ORDER — METHOCARBAMOL 500 MG PO TABS: 500.0000 mg | ORAL_TABLET | Freq: Four times a day (QID) | ORAL | Status: DC | PRN

## 2020-01-02 MED ORDER — ONDANSETRON 4 MG PO TBDP: 4.0000 mg | ORAL_TABLET | Freq: Four times a day (QID) | ORAL | Status: DC | PRN

## 2020-01-02 MED ORDER — CEFAZOLIN SODIUM-DEXTROSE 2-4 GM/100ML-% IV SOLN
2.0000 g | Freq: Three times a day (TID) | INTRAVENOUS | Status: AC
  Administered 2020-01-02: 2 g via INTRAVENOUS
  Filled 2020-01-02: qty 100

## 2020-01-02 MED ORDER — FENTANYL CITRATE (PF) 100 MCG/2ML IJ SOLN: 100.0000 ug | Freq: Once | INTRAMUSCULAR | Status: DC

## 2020-01-02 MED ORDER — FENTANYL CITRATE (PF) 250 MCG/5ML IJ SOLN
INTRAMUSCULAR | Status: AC
  Filled 2020-01-02: qty 5

## 2020-01-02 MED ORDER — WHITE PETROLATUM EX OINT
TOPICAL_OINTMENT | CUTANEOUS | Status: AC
  Filled 2020-01-02: qty 28.35

## 2020-01-02 MED ORDER — PHENYLEPHRINE HCL-NACL 10-0.9 MG/250ML-% IV SOLN
INTRAVENOUS | Status: DC | PRN
Start: 2020-01-02 — End: 2020-01-02
  Administered 2020-01-02: 40 ug/min via INTRAVENOUS

## 2020-01-02 MED ORDER — LIDOCAINE 2% (20 MG/ML) 5 ML SYRINGE
INTRAMUSCULAR | Status: DC | PRN
  Administered 2020-01-02: 60 mg via INTRAVENOUS

## 2020-01-02 MED ORDER — FENTANYL CITRATE (PF) 250 MCG/5ML IJ SOLN
INTRAMUSCULAR | Status: DC | PRN
  Administered 2020-01-02 (×3): 50 ug via INTRAVENOUS

## 2020-01-02 SURGICAL SUPPLY — 79 items
ADH SKN CLS APL DERMABOND .7 (GAUZE/BANDAGES/DRESSINGS) ×16
AGENT HMST 10 BLLW SHRT CANN (HEMOSTASIS) ×8
APL PRP STRL LF DISP 70% ISPRP (MISCELLANEOUS) ×4
APPLIER CLIP 9.375 MED OPEN (MISCELLANEOUS) ×3
APR CLP MED 9.3 20 MLT OPN (MISCELLANEOUS) ×2
BAG DECANTER FOR FLEXI CONT (MISCELLANEOUS) ×3 IMPLANT
BINDER BREAST LRG (GAUZE/BANDAGES/DRESSINGS) IMPLANT
BINDER BREAST XLRG (GAUZE/BANDAGES/DRESSINGS) IMPLANT
BINDER BREAST XXLRG (GAUZE/BANDAGES/DRESSINGS) ×1 IMPLANT
BIOPATCH RED 1 DISK 7.0 (GAUZE/BANDAGES/DRESSINGS) ×4 IMPLANT
CANISTER SUCT 3000ML PPV (MISCELLANEOUS) ×3 IMPLANT
CHLORAPREP W/TINT 26 (MISCELLANEOUS) ×4 IMPLANT
CLIP APPLIE 9.375 MED OPEN (MISCELLANEOUS) ×2 IMPLANT
CNTNR URN SCR LID CUP LEK RST (MISCELLANEOUS) ×2 IMPLANT
CONT SPEC 4OZ STRL OR WHT (MISCELLANEOUS)
COVER PROBE W GEL 5X96 (DRAPES) ×3 IMPLANT
COVER SURGICAL LIGHT HANDLE (MISCELLANEOUS) ×3 IMPLANT
COVER TRANSDUCER ULTRASND GEL (DISPOSABLE) ×3 IMPLANT
COVER WAND RF STERILE (DRAPES) ×2 IMPLANT
DERMABOND ADVANCED (GAUZE/BANDAGES/DRESSINGS) ×8
DERMABOND ADVANCED .7 DNX12 (GAUZE/BANDAGES/DRESSINGS) ×2 IMPLANT
DRAIN CHANNEL 19F RND (DRAIN) ×6 IMPLANT
DRAPE C-ARM 42X120 X-RAY (DRAPES) ×3 IMPLANT
DRAPE LAPAROSCOPIC ABDOMINAL (DRAPES) ×3 IMPLANT
DRAPE UTILITY 15X26 (DRAPE) ×1 IMPLANT
DRSG TEGADERM 4X4.75 (GAUZE/BANDAGES/DRESSINGS) ×2 IMPLANT
ELECT CAUTERY BLADE 6.4 (BLADE) ×3 IMPLANT
ELECT REM PT RETURN 9FT ADLT (ELECTROSURGICAL) ×3
ELECTRODE REM PT RTRN 9FT ADLT (ELECTROSURGICAL) ×2 IMPLANT
EVACUATOR SILICONE 100CC (DRAIN) ×6 IMPLANT
GAUZE 4X4 16PLY RFD (DISPOSABLE) ×3 IMPLANT
GAUZE SPONGE 4X4 12PLY STRL (GAUZE/BANDAGES/DRESSINGS) ×2 IMPLANT
GEL ULTRASOUND 20GR AQUASONIC (MISCELLANEOUS) ×1 IMPLANT
GLOVE BIO SURGEON STRL SZ8 (GLOVE) ×4 IMPLANT
GLOVE BIOGEL PI IND STRL 8 (GLOVE) ×2 IMPLANT
GLOVE BIOGEL PI INDICATOR 8 (GLOVE) ×2
GOWN STRL REUS W/ TWL LRG LVL3 (GOWN DISPOSABLE) ×6 IMPLANT
GOWN STRL REUS W/ TWL XL LVL3 (GOWN DISPOSABLE) ×2 IMPLANT
GOWN STRL REUS W/TWL LRG LVL3 (GOWN DISPOSABLE) ×12
GOWN STRL REUS W/TWL XL LVL3 (GOWN DISPOSABLE) ×6
HEMOSTAT HEMOBLAST BELLOWS (HEMOSTASIS) ×4 IMPLANT
INTRODUCER COOK 11FR (CATHETERS) IMPLANT
KIT BASIN OR (CUSTOM PROCEDURE TRAY) ×3 IMPLANT
KIT PORT POWER 8FR ISP CVUE (Port) ×1 IMPLANT
KIT TURNOVER KIT B (KITS) ×3 IMPLANT
NDL 18GX1X1/2 (RX/OR ONLY) (NEEDLE) ×2 IMPLANT
NDL FILTER BLUNT 18X1 1/2 (NEEDLE) IMPLANT
NDL HYPO 25GX1X1/2 BEV (NEEDLE) ×2 IMPLANT
NEEDLE 18GX1X1/2 (RX/OR ONLY) (NEEDLE) ×3 IMPLANT
NEEDLE FILTER BLUNT 18X 1/2SAF (NEEDLE) ×1
NEEDLE FILTER BLUNT 18X1 1/2 (NEEDLE) ×2 IMPLANT
NEEDLE HYPO 25GX1X1/2 BEV (NEEDLE) ×3 IMPLANT
NS IRRIG 1000ML POUR BTL (IV SOLUTION) ×3 IMPLANT
PACK GENERAL/GYN (CUSTOM PROCEDURE TRAY) ×3 IMPLANT
PAD ABD 8X10 STRL (GAUZE/BANDAGES/DRESSINGS) ×4 IMPLANT
PAD ARMBOARD 7.5X6 YLW CONV (MISCELLANEOUS) ×3 IMPLANT
PENCIL BUTTON HOLSTER BLD 10FT (ELECTRODE) ×2 IMPLANT
PENCIL SMOKE EVACUATOR (MISCELLANEOUS) ×4 IMPLANT
POSITIONER HEAD DONUT 9IN (MISCELLANEOUS) ×3 IMPLANT
SET INTRODUCER 12FR PACEMAKER (INTRODUCER) IMPLANT
SET SHEATH INTRODUCER 10FR (MISCELLANEOUS) IMPLANT
SHEATH COOK PEEL AWAY SET 9F (SHEATH) IMPLANT
SPECIMEN JAR X LARGE (MISCELLANEOUS) ×3 IMPLANT
SPONGE LAP 18X18 RF (DISPOSABLE) ×2 IMPLANT
SUT ETHILON 2 0 FS 18 (SUTURE) ×4 IMPLANT
SUT ETHILON 3 0 FSL (SUTURE) ×2 IMPLANT
SUT MNCRL AB 3-0 PS2 27 (SUTURE) ×4 IMPLANT
SUT MNCRL AB 4-0 PS2 18 (SUTURE) ×3 IMPLANT
SUT PROLENE 2 0 SH 30 (SUTURE) ×3 IMPLANT
SUT VIC AB 3-0 SH 18 (SUTURE) ×5 IMPLANT
SUT VIC AB 3-0 SH 27 (SUTURE) ×3
SUT VIC AB 3-0 SH 27X BRD (SUTURE) ×2 IMPLANT
SYR 5ML LUER SLIP (SYRINGE) ×3 IMPLANT
SYR CONTROL 10ML LL (SYRINGE) ×3 IMPLANT
TOWEL GREEN STERILE (TOWEL DISPOSABLE) ×3 IMPLANT
TOWEL GREEN STERILE FF (TOWEL DISPOSABLE) ×3 IMPLANT
TRAY LAPAROSCOPIC MC (CUSTOM PROCEDURE TRAY) ×3 IMPLANT
TUBING BULK SUCTION (MISCELLANEOUS) ×1 IMPLANT
YANKAUER SUCT BULB TIP NO VENT (SUCTIONS) ×1 IMPLANT

## 2020-01-02 NOTE — Interval H&P Note (Signed)
History and Physical Interval Note:  01/02/2020 12:14 PM  Tracy Garrett  has presented today for surgery, with the diagnosis of RIGHT BREAST CANCER.  The various methods of treatment have been discussed with the patient and family. After consideration of risks, benefits and other options for treatment, the patient has consented to  Procedure(s) with comments: BILATERAL MASTECTOMY WITH RIGHT SENTINEL LYMPH NODE MAPPING (Bilateral) - PEC BLOCK INSERTION PORT-A-CATH WITH ULTRASOUND GUIDANCE (N/A) as a surgical intervention.  The patient's history has been reviewed, patient examined, no change in status, stable for surgery.  I have reviewed the patient's chart and labs.  Questions were answered to the patient's satisfaction.     Turner Daniels MD

## 2020-01-02 NOTE — Transfer of Care (Signed)
Immediate Anesthesia Transfer of Care Note  Patient: Tracy Garrett  Procedure(s) Performed: BILATERAL MASTECTOMY WITH RIGHT SENTINEL LYMPH NODE MAPPING (Bilateral Breast) INSERTION PORT-A-CATH WITH ULTRASOUND GUIDANCE (Right Chest)  Patient Location: PACU  Anesthesia Type:GA combined with regional for post-op pain  Level of Consciousness: awake, alert  and oriented  Airway & Oxygen Therapy: Patient Spontanous Breathing and Patient connected to face mask oxygen  Post-op Assessment: Report given to RN, Post -op Vital signs reviewed and stable and Patient moving all extremities  Post vital signs: Reviewed and stable  Last Vitals:  Vitals Value Taken Time  BP 141/57 01/02/20 1614  Temp    Pulse 84 01/02/20 1615  Resp 17 01/02/20 1615  SpO2 97 % 01/02/20 1615  Vitals shown include unvalidated device data.  Last Pain:  Vitals:   01/02/20 1154  TempSrc:   PainSc: 0-No pain      Patients Stated Pain Goal: 4 (44/97/53 0051)  Complications: No complications documented.

## 2020-01-02 NOTE — Anesthesia Procedure Notes (Addendum)
Anesthesia Regional Block: Pectoralis block   Pre-Anesthetic Checklist: ,, timeout performed, Correct Patient, Correct Site, Correct Laterality, Correct Procedure, Correct Position, site marked, Risks and benefits discussed,  Surgical consent,  Pre-op evaluation,  At surgeon's request and post-op pain management  Laterality: Left  Prep: chloraprep       Needles:  Injection technique: Single-shot  Needle Type: Echogenic Stimulator Needle     Needle Length: 10cm  Needle Gauge: 20     Additional Needles:   Procedures:,,,, ultrasound used (permanent image in chart),,,,  Narrative:  Start time: 01/02/2020 12:35 PM End time: 01/02/2020 12:38 PM Injection made incrementally with aspirations every 5 mL.  Performed by: Personally  Anesthesiologist: Lidia Collum, MD  Additional Notes: Standard monitors applied. Skin prepped. Good needle visualization with ultrasound. Injection made in 5cc increments with no resistance to injection. Patient tolerated the procedure well.

## 2020-01-02 NOTE — Anesthesia Preprocedure Evaluation (Signed)
Anesthesia Evaluation  Patient identified by MRN, date of birth, ID band Patient awake    Reviewed: Allergy & Precautions, NPO status , Patient's Chart, lab work & pertinent test results  History of Anesthesia Complications (+) PONVNegative for: history of anesthetic complications  Airway Mallampati: II  TM Distance: >3 FB Neck ROM: Full    Dental  (+) Teeth Intact   Pulmonary sleep apnea ,    Pulmonary exam normal        Cardiovascular hypertension, Normal cardiovascular exam     Neuro/Psych negative neurological ROS  negative psych ROS   GI/Hepatic Neg liver ROS, GERD  ,  Endo/Other  diabetes, Type 2  Renal/GU Renal InsufficiencyRenal disease  negative genitourinary   Musculoskeletal negative musculoskeletal ROS (+)   Abdominal   Peds  Hematology negative hematology ROS (+)   Anesthesia Other Findings  Breast cancer  Reproductive/Obstetrics                             Anesthesia Physical Anesthesia Plan  ASA: III  Anesthesia Plan: General   Post-op Pain Management: GA combined w/ Regional for post-op pain   Induction: Intravenous  PONV Risk Score and Plan: 4 or greater and Ondansetron, Dexamethasone, Midazolam and Treatment may vary due to age or medical condition  Airway Management Planned: LMA  Additional Equipment: None  Intra-op Plan:   Post-operative Plan: Extubation in OR  Informed Consent: I have reviewed the patients History and Physical, chart, labs and discussed the procedure including the risks, benefits and alternatives for the proposed anesthesia with the patient or authorized representative who has indicated his/her understanding and acceptance.     Dental advisory given  Plan Discussed with:   Anesthesia Plan Comments:         Anesthesia Quick Evaluation

## 2020-01-02 NOTE — Progress Notes (Signed)
Patient stated that she has had some post nasal drip and some throat irritation.  Patient went to PCP and received antibiotics and she stated that she contacted Dr. Irven [place] office to make them aware of the new antibiotics.

## 2020-01-02 NOTE — Anesthesia Procedure Notes (Signed)
Procedure Name: Intubation Date/Time: 01/02/2020 1:08 PM Performed by: Amadeo Garnet, CRNA Pre-anesthesia Checklist: Patient identified, Emergency Drugs available, Patient being monitored and Suction available Patient Re-evaluated:Patient Re-evaluated prior to induction Oxygen Delivery Method: Circle system utilized Preoxygenation: Pre-oxygenation with 100% oxygen Induction Type: IV induction Ventilation: Mask ventilation without difficulty and Oral airway inserted - appropriate to patient size Laryngoscope Size: Mac and 3 Grade View: Grade II Tube size: 7.0 mm Number of attempts: 2 Airway Equipment and Method: Stylet Placement Confirmation: ETT inserted through vocal cords under direct vision,  positive ETCO2 and breath sounds checked- equal and bilateral Secured at: 22 cm Tube secured with: Tape Dental Injury: Teeth and Oropharynx as per pre-operative assessment

## 2020-01-02 NOTE — Op Note (Signed)
Preoperative diagnosis: Stage I right breast cancer multifocal  Postop diagnosis: Same  Procedure: Right simple mastectomy with right axillary sentinel lymph node mapping using methylene blue dye and risk reducing left simple mastectomy and port placement using ultrasound and C-arm guidance  Surgeon: Erroll Luna, MD  Anesthesia: General with bilateral pectoral block 0.25 % percent Marcaine plain  EBL: 80 cc  Specimen: Bilateral breast oriented with superior suture and 2 right axillary sentinel nodes hot and blue to pathology  Drains  TWO 19 round drains per side   Indications for procedure: The patient is a 64 year old female with multifocal right breast cancer.  She opted for right mastectomy with desired left mastectomy for risk reduction and for symmetry purposes.  She will need postoperative chemotherapy and a Port-A-Cath was requested by the medical oncology team.The surgical and non surgical options have been discussed with the patient.  Risks of surgery include bleeding,  Infection,  Flap necrosis,  Tissue loss,  Chronic pain, death, Numbness,  And the need for additional procedures.  Reconstruction options also have been discussed with the patient as well.  The patient agrees to proceed.  Risk of bleeding, infection, pneumothorax, hemothorax, major vascular injury, cardiac injury, catheter migration, catheter malfunction, catheter separation, and the need further treatment and procedures.  Description of procedure: The patient was met in the holding area and questions were answered.  She underwent injection of the right breast with technetium sulfur colloid for mapping purposes.  She underwent pectoral block by anesthesia bilaterally.  All questions were answered.  She is taken back to the operative room.  She was placed supine upon the OR table.  After induction of general anesthesia,  both arms were tucked and the upper chest and neck region was prepped and draped in sterile fashion  for Port-A-Cath placement.  Timeout performed.  The ultrasound was used to locate the right internal jugular vein.  With the patient Trendelenburg a needle was introduced under direct guidance.  Return of dark nonpulsatile blood noted and wire was fed through the needle down into the superior vena cava.  C-arm was used and the wire was manipulated down into the inferior vena cava.  A small skin incision was made at the insertion site.  A small pocket was created below the right clavicle using a scalpel.  Dissection was carried out a small pocket was made for port.  The port was brought in the field attached and flushed.  It was tunneled from the lower incision to the upper incision.   It was  cut to 20 cm.  With the patient back in Trendelenburg I was able to advance the dilator and introducer complex moving wire to and fro with no resistance.  The wire and dilator were removed leaving the introducer in place.  The cath was fed through this and the peel-away sheath was peeled away.  C arm showed the tip to be at the cavoatrial junction with no obvious complication.  The catheter drew back dark blood easily and was flushed with heparinized saline.  5 cc of 100 units/cc of heparinized saline were placed in the port.  The port was secured to the chest wall with 2-0 Prolene.  Both incisions were closed with 3-0 Vicryl and 4 Monocryl.  Glue applied.  Patient was reprepped and redraped in a sterile fashion for bilateral mastectomies.  A timeout was performed second time.  The left side was done first.  Curvilinear incision was made above and below the nipple areolar  complex.  Superior skin flap taken up to the clavicle.  Inferior skin flap taken down to the inframammary fold.  Of note she had bilateral breast reduction and we use the lower scar for the lower incision.  All breast tissue was then excised in a medial to lateral fashion until the lateral attachments were encountered and divided.  Hemostasis achieved.  219  round drains were placed through separate stab incisions using 2-0 nylon to secure.  Hemoblast was then placed under the skin flaps.  Hemostasis was excellent.  Wound closed with a deep layer 3-0 Vicryl and 4 Monocryl. Blue dye injected into the right breast and massaged for 5 minutes  Right mastectomy performed a similar fashion curvilinear incision was made above below the nipple areolar complex using the inferior scar as the lower incision.  Skin flap taken up to the clavicle superiorly.  The inframammary fold was the lower boundary.  All breast tissue was then excised off the chest wall in a medial to lateral fashion using cautery.  Once lateral attachments were encountered I used the neoprobe identified the sentinel node which was blue and hot.  There were 2 of these.  Once I remove these the background counts of the axilla are approached 0.  I opted to divide the lateral attachments of the right breast and passed off the field after placing an orientation suture superiorly.  Hemostasis achieved with cautery and he move last used on the side as well.  219 round drains placed through separate stab incisions.  Hemostasis was excellent.  Wound closed with 3-0 Vicryl for Monocryl.  Dermabond applied to all incisions and bulbs had a good seal.  Breast binder placed.  All counts were counted and found to be correct.  The patient was then awoken extubated taken recovery in satisfactory condition.

## 2020-01-02 NOTE — Anesthesia Procedure Notes (Addendum)
Anesthesia Regional Block: Pectoralis block   Pre-Anesthetic Checklist: ,, timeout performed, Correct Patient, Correct Site, Correct Laterality, Correct Procedure, Correct Position, site marked, Risks and benefits discussed,  Surgical consent,  Pre-op evaluation,  At surgeon's request and post-op pain management  Laterality: Right  Prep: chloraprep       Needles:  Injection technique: Single-shot  Needle Type: Echogenic Stimulator Needle     Needle Length: 10cm  Needle Gauge: 20     Additional Needles:   Procedures:,,,, ultrasound used (permanent image in chart),,,,  Narrative:  Start time: 01/02/2020 12:38 PM End time: 01/02/2020 12:41 PM Injection made incrementally with aspirations every 5 mL.  Performed by: Personally  Anesthesiologist: Lidia Collum, MD  Additional Notes: Standard monitors applied. Skin prepped. Good needle visualization with ultrasound. Injection made in 5cc increments with no resistance to injection. Patient tolerated the procedure well.

## 2020-01-03 ENCOUNTER — Other Ambulatory Visit: Payer: 59

## 2020-01-03 ENCOUNTER — Encounter (HOSPITAL_COMMUNITY): Payer: Self-pay | Admitting: Surgery

## 2020-01-03 ENCOUNTER — Ambulatory Visit: Payer: 59

## 2020-01-03 DIAGNOSIS — C50911 Malignant neoplasm of unspecified site of right female breast: Secondary | ICD-10-CM | POA: Diagnosis not present

## 2020-01-03 LAB — BASIC METABOLIC PANEL
Anion gap: 7 (ref 5–15)
BUN: 11 mg/dL (ref 8–23)
CO2: 27 mmol/L (ref 22–32)
Calcium: 8.3 mg/dL — ABNORMAL LOW (ref 8.9–10.3)
Chloride: 105 mmol/L (ref 98–111)
Creatinine, Ser: 1.05 mg/dL — ABNORMAL HIGH (ref 0.44–1.00)
GFR calc Af Amer: >60 mL/min (ref >60)
GFR calc non Af Amer: 56 mL/min — ABNORMAL LOW (ref >60)
Glucose, Bld: 159 mg/dL — ABNORMAL HIGH (ref 70–99)
Potassium: 4.2 mmol/L (ref 3.5–5.1)
Sodium: 139 mmol/L (ref 135–145)

## 2020-01-03 LAB — CBC
HCT: 33.4 % — ABNORMAL LOW (ref 36.0–46.0)
Hemoglobin: 10.9 g/dL — ABNORMAL LOW (ref 12.0–15.0)
MCH: 30 pg (ref 26.0–34.0)
MCHC: 32.6 g/dL (ref 30.0–36.0)
MCV: 92 fL (ref 80.0–100.0)
Platelets: 239 10*3/uL (ref 150–400)
RBC: 3.63 MIL/uL — ABNORMAL LOW (ref 3.87–5.11)
RDW: 13.7 % (ref 11.5–15.5)
WBC: 11 10*3/uL — ABNORMAL HIGH (ref 4.0–10.5)
nRBC: 0 % (ref 0.0–0.2)

## 2020-01-03 MED ORDER — OXYCODONE HCL 5 MG PO TABS: 5.0000 mg | ORAL_TABLET | Freq: Four times a day (QID) | ORAL | 0 refills | Status: DC | PRN

## 2020-01-03 MED ORDER — IBUPROFEN 800 MG PO TABS: 800.0000 mg | ORAL_TABLET | Freq: Three times a day (TID) | ORAL | 0 refills | Status: DC | PRN

## 2020-01-03 NOTE — Discharge Summary (Signed)
Physician Discharge Summary  Patient ID: Tracy Garrett MRN: [id number] DOB/AGE: 64/12/1955 64 y.o.  Admit date: 01/02/2020 Discharge date: 01/03/2020  Admission Diagnoses:Active Problems:   Breast cancer, stage 1, estrogen receptor positive, right [facility])  Discharge Diagnoses:  Active Problems:   Breast cancer, stage 1, estrogen receptor positive, right ([place])   Discharged Condition: good  Hospital Course: Pt did well post op.  She was able to ambulate and tolerated her diet Pain well controlled and drain output appropriate.   Consults: None  Significant Diagnostic Studies: labs:  CBC    Component Value Date/Time   WBC 11.0 (H) 01/03/2020 0140   RBC 3.63 (L) 01/03/2020 0140   HGB 10.9 (L) 01/03/2020 0140   HGB 13.3 11/28/2019 1217   HCT 33.4 (L) 01/03/2020 0140   PLT 239 01/03/2020 0140   PLT 267 11/28/2019 1217   MCV 92.0 01/03/2020 0140   MCV 93.4 02/08/2013 1230   MCH 30.0 01/03/2020 0140   MCHC 32.6 01/03/2020 0140   RDW 13.7 01/03/2020 0140   LYMPHSABS 2.3 11/28/2019 1217   MONOABS 0.8 11/28/2019 1217   EOSABS 0.3 11/28/2019 1217   BASOSABS 0.0 11/28/2019 1217    Treatments: surgery: bilateral simple mastectomy port placement right SLN mapping   Discharge Exam: Blood pressure (!) 99/57, pulse 71, temperature 98.2 F (36.8 C), resp. rate 17, height 5' 2.75" (1.594 m), weight (!) 95.3 kg, SpO2 97 %. General appearance: alert and cooperative Resp: clear to auscultation bilaterally Cardio: regular rate and rhythm, S1, S2 normal, no murmur, click, rub or gallop Incision/Wound:flaps viable no hematoma bilaterally   Disposition: Discharge disposition: 01-Home or Self Care       Discharge Instructions    Diet - low sodium heart healthy   Complete by: As directed    Increase activity slowly   Complete by: As directed      Allergies as of 01/03/2020      Reactions   Hydromorphone Rash   Arm reddened at IV med site   Other Itching, Rash   NUTS  CAUSE MOUTH TO ITCH      Medication List    TAKE these medications   ALPRAZolam 1 MG tablet Commonly known as: XANAX Take 1 tablet 1 hour prior to procedure.  Then take 1 tablet right before procedure if needed   aspirin EC 81 MG tablet Take 81 mg by mouth daily. Swallow whole.   cetirizine 10 MG tablet Commonly known as: ZYRTEC Take 10 mg by mouth daily.   glucose blood test strip Commonly known as: Contour Next Test Check blood sugar 1 time daily-[id number]   ibuprofen 800 MG tablet Commonly known as: ADVIL Take 1 tablet (800 mg total) by mouth every 8 (eight) hours as needed.   losartan 50 MG tablet Commonly known as: Cozaar Take 1/2-1 tab daily for blood pressure and to protect kidneys. What changed:   how much to take  how to take this  when to take this  additional instructions   metFORMIN 500 MG 24 hr tablet Commonly known as: Glucophage XR Take 1 tablet (500 mg total) by mouth daily with breakfast.   OMEGA 3 PO Take 1 capsule by mouth 2 (two) times daily.   OVER THE COUNTER MEDICATION Takes CoQ10 1 capsule daily   oxyCODONE 5 MG immediate release tablet Commonly known as: Oxy IR/ROXICODONE Take 1 tablet (5 mg total) by mouth every 6 (six) hours as needed for severe pain.   pantoprazole 40 MG tablet Commonly known  as: PROTONIX Take 40 mg by mouth daily.   rosuvastatin 5 MG tablet Commonly known as: CRESTOR Take 1 tablet daily for Cholesterol What changed:   how much to take  how to take this  when to take this  additional instructions   sulfamethoxazole-trimethoprim 800-160 MG tablet Commonly known as: BACTRIM DS Take 1 tablet by mouth 2 (two) times daily.   Vitamin D3 125 MCG (5000 UT) Tabs Take 5,000 Units by mouth daily.        Signed: Joyice Faster Madeline Pho 01/03/2020, 7:53 AM

## 2020-01-03 NOTE — Progress Notes (Signed)
Riesa Pope to be D/C'd per MD order. Discussed with the patient and all questions fully answered. ? VSS, Skin clean, dry and intact without evidence of skin break down, no evidence of skin tears noted. ? IV catheter discontinued intact. Site without signs and symptoms of complications. Dressing and pressure applied. ? An After Visit Summary was printed and given to the patient. Patient informed where to pickup prescriptions. ? D/c education completed with patient/family including follow up instructions, medication list, d/c activities limitations if indicated, with other d/c instructions as indicated by MD - patient able to verbalize understanding, all questions fully answered.  ? Patient instructed to return to ED, call 911, or call MD for any changes in condition.  ? Patient to be escorted via [place], and D/C home via private auto.

## 2020-01-03 NOTE — Progress Notes (Signed)
Patient discharged before being able to waste 50 mcg of fentanyl in pyxis, 50 mcg's of fentanyl wasted with Rex Kras, RN in [place] cycle.  Rowe Pavy, RN

## 2020-01-03 NOTE — Discharge Instructions (Signed)
PORT-A-CATH: POST OP INSTRUCTIONS  Always review your discharge instruction sheet given to you by the facility where your surgery was performed.   1. A prescription for pain medication may be given to you upon discharge. Take your pain medication as prescribed, if needed. If narcotic pain medicine is not needed, then you make take acetaminophen (Tylenol) or ibuprofen (Advil) as needed.  2. Take your usually prescribed medications unless otherwise directed. 3. If you need a refill on your pain medication, please contact our office. All narcotic pain medicine now requires a paper prescription.  Phoned in and fax refills are no longer allowed by law.  Prescriptions will not be filled after 5 pm or on weekends.  4. You should follow a light diet for the remainder of the day after your procedure. 5. Most patients will experience some mild swelling and/or bruising in the area of the incision. It may take several days to resolve. 6. It is common to experience some constipation if taking pain medication after surgery. Increasing fluid intake and taking a stool softener (such as Colace) will usually help or prevent this problem from occurring. A mild laxative (Milk of Magnesia or Miralax) should be taken according to package directions if there are no bowel movements after 48 hours.  7. Unless discharge instructions indicate otherwise, you may remove your bandages 48 hours after surgery, and you may shower at that time. You may have steri-strips (small white skin tapes) in place directly over the incision.  These strips should be left on the skin for 7-10 days.  If your surgeon used Dermabond (skin glue) on the incision, you may shower in 24 hours.  The glue will flake off over the next 2-3 weeks.  8. If your port is left accessed at the end of surgery (needle left in port), the dressing cannot get wet and should only by changed by a healthcare professional. When the port is no longer accessed (when the  needle has been removed), follow step 7.   9. ACTIVITIES:  Limit activity involving your arms for the next 72 hours. Do no strenuous exercise or activity for 1 week. You may drive when you are no longer taking prescription pain medication, you can comfortably wear a seatbelt, and you can maneuver your car. 10.You may need to see your doctor in the office for a follow-up appointment.  Please       check with your doctor.  11.When you receive a new Port-a-Cath, you will get a product guide and        ID card.  Please keep them in case you need them.  WHEN TO CALL YOUR DOCTOR [phone]): 1. Fever over 101.0 2. Chills 3. Continued bleeding from incision 4. Increased redness and tenderness at the site 5. Shortness of breath, difficulty breathing   The clinic staff is available to answer your questions during regular business hours. Please don't hesitate to call and ask to speak to one of the nurses or medical assistants for clinical concerns. If you have a medical emergency, go to the nearest emergency room or call 911.  A surgeon from [facility] is always on call at the hospital.     For further information, please visit [email or web address]   [facility] [phone]  MASTECTOMY: POST OP INSTRUCTIONS  Always review your discharge instruction sheet given to you by the facility where your surgery was performed. IF YOU HAVE DISABILITY OR FAMILY LEAVE FORMS, YOU MUST BRING THEM TO  THE OFFICE FOR PROCESSING.   DO NOT GIVE THEM TO YOUR DOCTOR. A prescription for pain medication may be given to you upon discharge.  Take your pain medication as prescribed, if needed.  If narcotic pain medicine is not needed, then you may take acetaminophen (Tylenol) or ibuprofen (Advil) as needed. 1. Take your usually prescribed medications unless otherwise directed. 2. If you need a refill on your pain medication, please contact your pharmacy.  They will  contact our office to request authorization.  Prescriptions will not be filled after 5pm or on week-ends. 3. You should follow a light diet the first few days after arrival home, such as soup and crackers, etc.  Resume your normal diet the day after surgery. 4. Most patients will experience some swelling and bruising on the chest and underarm.  Ice packs will help.  Swelling and bruising can take several days to resolve.  5. It is common to experience some constipation if taking pain medication after surgery.  Increasing fluid intake and taking a stool softener (such as Colace) will usually help or prevent this problem from occurring.  A mild laxative (Milk of Magnesia or Miralax) should be taken according to package instructions if there are no bowel movements after 48 hours. 6. Unless discharge instructions indicate otherwise, leave your bandage dry and in place until your next appointment in 3-5 days.  You may take a limited sponge bath.  No tube baths or showers until the drains are removed.  You may have steri-strips (small skin tapes) in place directly over the incision.  These strips should be left on the skin for 7-10 days.  If your surgeon used skin glue on the incision, you may shower in 24 hours.  The glue will flake off over the next 2-3 weeks.  Any sutures or staples will be removed at the office during your follow-up visit. 7. DRAINS:  If you have drains in place, it is important to keep a list of the amount of drainage produced each day in your drains.  Before leaving the hospital, you should be instructed on drain care.  Call our office if you have any questions about your drains. 8. ACTIVITIES:  You may resume regular (light) daily activities beginning the next day--such as daily self-care, walking, climbing stairs--gradually increasing activities as tolerated.  You may have sexual intercourse when it is comfortable.  Refrain from any heavy lifting or straining until approved by your  doctor. a. You may drive when you are no longer taking prescription pain medication, you can comfortably wear a seatbelt, and you can safely maneuver your car and apply brakes. b. RETURN TO WORK:  __________________________________________________________ 9. You should see your doctor in the office for a follow-up appointment approximately 3-5 days after your surgery.  Your doctor's nurse will typically make your follow-up appointment when she calls you with your pathology report.  Expect your pathology report 2-3 business days after your surgery.  You may call to check if you do not hear from [place] after three days.   10. OTHER INSTRUCTIONS: ______________________________________________________________________________________________ ____________________________________________________________________________________________ WHEN TO CALL YOUR DOCTOR: 1. Fever over 101.0 2. Nausea and/or vomiting 3. Extreme swelling or bruising 4. Continued bleeding from incision. 5. Increased pain, redness, or drainage from the incision. The clinic staff is available to answer your questions during regular business hours.  Please don't hesitate to call and ask to speak to one of the nurses for clinical concerns.  If you have a medical emergency, go to  the nearest emergency room or call 911.  A surgeon from [facility] is always on call at the hospital. [address], [place], [place], [place]  [address] ? [address], [place], [place]   [address] [phone] ? [phone] ? FAX [phone] Web site: [facility] drains are used to remove extra fluid that normally builds up in a surgical wound after surgery. A surgical drain helps to heal a surgical wound. Different kinds of surgical drains include:  Active drains. These drains use suction to pull drainage away from the surgical wound. Drainage flows through a tube to a container outside of the body.  With these drains, you need to keep the bulb or the drainage container flat (compressed) at all times, except while you empty it. Flattening the bulb or container creates suction.  Passive drains. These drains allow fluid to drain naturally, by gravity. Drainage flows through a tube to a bandage (dressing) or a container outside of the body. Passive drains do not need to be emptied. A drain is placed during surgery. Right after surgery, drainage is usually bright red and a little thicker than water. The drainage may gradually turn yellow or pink and become thinner. It is likely that your health care provider will remove the drain when the drainage stops or when the amount decreases to 1-2 Tbsp (15-30 mL) during a 24-hour period. Supplies needed:  Tape.  Germ-free cleaning solution (sterile saline).  Cotton swabs.  Split gauze drain sponge: 4 x 4 inches (10 x 10 cm).  Gauze square: 4 x 4 inches (10 x 10 cm). How to care for your surgical drain Care for your drain as told by your health care provider. This is important to help prevent infection. If your drain is placed at your back, or any other hard-to-reach area, ask another person to assist you in performing the following tasks: General care  Keep the skin around the drain dry and covered with a dressing at all times.  Check your drain area every day for signs of infection. Check for: ? Redness, swelling, or pain. ? Pus or a bad smell. ? Cloudy drainage. ? Tenderness or pressure at the drain exit site. Changing the dressing Follow instructions from your health care provider about how to change your dressing. Change your dressing at least once a day. Change it more often if needed to keep the dressing dry. Make sure you: 1. Gather your supplies. 2. Wash your hands with soap and water before you change your dressing. If soap and water are not available, use hand sanitizer. 3. Remove the old dressing. Avoid using scissors to do  that. 4. Wash your hands with soap and water again after removing the old dressing. 5. Use sterile saline to clean your skin around the drain. You may need to use a cotton swab to clean the skin. 6. Place the tube through the slit in a drain sponge. Place the drain sponge so that it covers your wound. 7. Place the gauze square or another drain sponge on top of the drain sponge that is on the wound. Make sure the tube is between those layers. 8. Tape the dressing to your skin. 9. Tape the drainage tube to your skin 1-2 inches (2.5-5 cm) below the place where the tube enters your body. Taping keeps the tube from pulling on any stitches (sutures) that you have. 10. Wash your hands with soap and  water. 11. Write down the color of your drainage and how often you change your dressing. How to empty your active drain  1. Make sure that you have a measuring cup that you can empty your drainage into. 2. Wash your hands with soap and water. If soap and water are not available, use hand sanitizer. 3. Loosen any pins or clips that hold the tube in place. 4. If your health care provider tells you to strip the tube to prevent clots and tube blockages: ? Hold the tube at the skin with one hand. Use your other hand to pinch the tubing with your thumb and first finger. ? Gently move your fingers down the tube while squeezing very lightly. This clears any drainage, clots, or tissue from the tube. ? You may need to do this several times each day to keep the tube clear. Do not pull on the tube. 5. Open the bulb cap or the drain plug. Do not touch the inside of the cap or the bottom of the plug. 6. Turn the device upside down and gently squeeze. 7. Empty all of the drainage into the measuring cup. 8. Compress the bulb or the container and replace the cap or the plug. To compress the bulb or the container, squeeze it firmly in the middle while you close the cap or plug the container. 9. Write down the amount of  drainage that you have in each 24-hour period. If you have less than 2 Tbsp (30 mL) of drainage during 24 hours, contact your health care provider. 10. Flush the drainage down the toilet. 11. Wash your hands with soap and water. Contact a health care provider if:  You have redness, swelling, or pain around your drain area.  You have pus or a bad smell coming from your drain area.  You have a fever or chills.  The skin around your drain is warm to the touch.  The amount of drainage that you have is increasing instead of decreasing.  You have drainage that is cloudy.  There is a sudden stop or a sudden decrease in the amount of drainage that you have.  Your drain tube falls out.  Your active drain does not stay compressed after you empty it. Summary  Surgical drains are used to remove extra fluid that normally builds up in a surgical wound after surgery.  Different kinds of surgical drains include active drains and passive drains. Active drains use suction to pull drainage away from the surgical wound, and passive drains allow fluid to drain naturally.  It is important to care for your drain to prevent infection. If your drain is placed at your back, or any other hard-to-reach area, ask another person to assist you.  Contact your health care provider if you have redness, swelling, or pain around your drain area. This information is not intended to replace advice given to you by your health care provider. Make sure you discuss any questions you have with your health care provider. Document Revised: 06/28/2018 Document Reviewed: 06/28/2018 Elsevier Patient Education  2020 [employer].

## 2020-01-03 NOTE — Anesthesia Postprocedure Evaluation (Signed)
Anesthesia Post Note  Patient: Tracy Garrett  Procedure(s) Performed: BILATERAL MASTECTOMY WITH RIGHT SENTINEL LYMPH NODE MAPPING (Bilateral Breast) INSERTION PORT-A-CATH WITH ULTRASOUND GUIDANCE (Right Chest)     Patient location during evaluation: PACU Anesthesia Type: General Level of consciousness: awake and alert Pain management: pain level controlled Vital Signs Assessment: post-procedure vital signs reviewed and stable Respiratory status: spontaneous breathing, nonlabored ventilation, respiratory function stable and patient connected to nasal cannula oxygen Cardiovascular status: blood pressure returned to baseline and stable Postop Assessment: no apparent nausea or vomiting Anesthetic complications: no   No complications documented.  Last Vitals:  Vitals:   01/03/20 0159 01/03/20 0447  BP: (!) 100/59 (!) 99/57  Pulse: 75 71  Resp: 17 17  Temp: 37.1 C 36.8 C  SpO2: 97% 97%    Last Pain:  Vitals:   01/02/20 2246  TempSrc:   PainSc: [place]

## 2020-01-08 ENCOUNTER — Encounter: Payer: Self-pay | Admitting: *Deleted

## 2020-01-09 ENCOUNTER — Encounter: Payer: 59 | Admitting: Adult Health

## 2020-01-09 LAB — SURGICAL PATHOLOGY

## 2020-01-10 ENCOUNTER — Ambulatory Visit: Payer: 59 | Admitting: Oncology

## 2020-01-10 ENCOUNTER — Other Ambulatory Visit: Payer: 59

## 2020-01-10 ENCOUNTER — Ambulatory Visit: Payer: 59

## 2020-01-13 NOTE — Progress Notes (Signed)
Tracy Garrett  Telephone:([phone] Fax:(336) [phone]     ID: Tracy Garrett DOB: [birth year]  MR#: [id number]  JEH#:[id number]  Patient Care Team: Unk Pinto, MD as PCP - General (Internal Medicine) [name], RN as Oncology Nurse Navigator [name], RN as Oncology Nurse Navigator Erroll Luna, MD as Consulting Physician (General Surgery) Letica Giaimo, Virgie Dad, MD as Consulting Physician (Oncology) Eppie Gibson, MD as Attending Physician (Radiation Oncology) Mcarthur Rossetti, MD as Consulting Physician (Orthopedic Surgery) Wallene Huh, DPM as Consulting Physician (Podiatry) Chauncey Cruel, MD OTHER MD:  CHIEF COMPLAINT: triple positive breast cancer  CURRENT TREATMENT: Adjuvant chemo immunotherapy pending   INTERVAL HISTORY: Tracy Garrett returns today for follow up of her triple positive breast cancer accompanied by her husband. She was evaluated in the multidisciplinary breast cancer clinic on 11/28/2019.  She underwent genetic counseling during clinic. Results were negative, with the exception of two variants of uncertain significance-- one in BRIP1, and the other in POLD1.  She was scheduled to undergo breast MRI on 12/01/2019, but her breasts did not fully fit into the breast coil at [facility].   She underwent echocardiogram on 12/13/2019 showing an ejection fraction of 55-60%.  She opted to proceed with bilateral mastectomies on 01/02/2020 under Dr. Brantley Stage. Pathology from the procedure [id number]) revealed: 1. Right Breast  - multifocal invasive ductal carcinoma   1. Grade 2, 0.9 cm   2. Grade 2, >2 cm  - ductal carcinoma in situ, intermediate grade  - margins: invasive carcinoma <1 mm from posterior margin; negative for in situ carcinoma 2. Left Breast  - complex sclerosing lesion 3. Lymph Nodes (2), right axillary, sentinel  - isolated tumor cells in one lymph node  - one negative for carcinoma   REVIEW OF  SYSTEMS: Atina did well with her bilateral mastectomies and does not complain of significant pain, bleeding, swelling, or other issues.  She does not think she will want reconstruction but of course has not completely ruled it out.  She is very concerned about her diabetes and does not want to worsen that.  The issue there is steroids needed for premedication and nausea control.  Of course she still has the drains in place so she is uncomfortable from that.  She is hoping the drains will be able to come out later this week.  A detailed review of systems today was otherwise stable.   HISTORY OF CURRENT ILLNESS: From the original intake note:  Tracy Garrett had routine screening mammography on 05/01/2020 showing a possible abnormality in the right breast. She underwent right diagnostic mammography with tomography at [facility] on 05/09/2019 showing: breast density category A; probably-benign regional calcifications in the right breast at 11 o'clock. Short term follow up was recommended.   She returned for follow up at [facility] on 11/13/2019. She underwent right diagnostic mammography and right breast ultrasonography showing: breast composition A; indeterminate calcifications in upper-outer right breast spanning 5 cm; 0.8 cm irregular mass in right breast at 9 o'clock.  Accordingly on 11/20/2019 she proceeded to biopsy of the right breast areas in question. The pathology from this procedure ([id number]) showed:  1. Right breast, UOQ  - foci of invasive and in situ mammary carcinoma, grade 2-3, e-cadherin positive.  2. Right breast, 9 o'clock  - invasive mammary carcinoma, grade 2-3, e-cadherin positive  - Prognostic indicators significant for: estrogen receptor, 95% positive and progesterone receptor, 25% positive, both with strong staining intensity. Proliferation marker Ki67 at 20%.  HER2 equivocal by immunohistochemistry (2+), but positive by fluorescent in situ hybridization with a signals ratio 3.30  and number per cell 7.25.  The patient's subsequent history is as detailed below.   PAST MEDICAL HISTORY: Past Medical History:  Diagnosis Date   Allergy    Anemia    Arthritis    Cancer ([place])    right breast cancer   Cataracts, bilateral    Chronic kidney disease    CKD   Family history of breast cancer    Family history of multiple myeloma    Fatty liver disease, nonalcoholic 1/58/3094   [place] appears normal on follow up US 09/2019   GERD (gastroesophageal reflux disease)    History of kidney stones    x2 episodes   Hyperlipidemia    Hypertension    PONV (postoperative nausea and vomiting)    Sleep apnea    test was dx, as mild- no further tx. required   Type II or unspecified type diabetes mellitus without mention of complication, not stated as uncontrolled     PAST SURGICAL HISTORY: Past Surgical History:  Procedure Laterality Date   BREAST SURGERY  1999   Reduction Mammoplasties   CHOLECYSTECTOMY  2003   colonoscopsy  11/2007   neg- polyp removal with both procedures x2   CYSTOSCOPY     surgery to extract stone x 1 with stent   EUS N/A 07/26/2014   Procedure: UPPER ENDOSCOPIC ULTRASOUND (EUS) LINEAR;  Surgeon: Beryle Beams, MD;  Location: [facility];  Service: Endoscopy;  Laterality: N/A;   LAPAROTOMY N/A 10/10/2014   Procedure: 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;  Surgeon: TJackolyn Confer MD;  Location: [facility] ORS;  Service: General;  Laterality: N/A;   LYSIS OF ADHESION  10/10/2014   Procedure: LYSIS OF ADHESION;  Surgeon: TJackolyn Confer MD;  Location: [facility];  Service: General;;   MASTECTOMY W/ SENTINEL NODE BIOPSY Bilateral 01/02/2020   Procedure: BILATERAL MASTECTOMY WITH RIGHT SENTINEL LYMPH NODE MAPPING;  Surgeon: CErroll Luna MD;  Location: [facility]  Service: General;  Laterality: Bilateral;  PEC BLOCK   PORTACATH PLACEMENT Right 01/02/2020   Procedure: INSERTION PORT-A-CATH WITH ULTRASOUND GUIDANCE;   Surgeon: CErroll Luna MD;  Location: [facility]  Service: General;  Laterality: Right;   TONSILLECTOMY     and adenoids removed age [age]  VAGINAL HYSTERECTOMY  2002    FAMILY HISTORY: Family History  Problem Relation Age of Onset   Breast cancer Mother 774  Heart disease Father        smoker   Diabetes type I Half-Sister    Alzheimer's disease Maternal Grandmother    Emphysema Paternal Grandfather        smoker   Heart attack Paternal Grandfather    Lung cancer Paternal Grandfather    CAD Brother 572      stents   Breast cancer Maternal Aunt        dx. in her late 725s  Breast cancer Cousin 572  Cancer Maternal Uncle        dx. in his 679s tumor on the back of neck   Breast cancer Paternal Aunt        dx. >50   Multiple myeloma Maternal Aunt 92   Multiple myeloma Paternal Aunt        dx. >50   Breast cancer Cousin 526  Her father died at age 30757 Her mother died at age 64 She has 2 brothers  and had 1 sister who is deceased. She reports breast cancer on her maternal side-- her mother at age 68, an aunt, and a cousin.   GYNECOLOGIC HISTORY:  No LMP recorded. Patient has had a hysterectomy. Menarche: 64 years old Age at first live birth: 64 years old [place] P 2 LMP unsure Contraceptive: never used HRT never used  Hysterectomy? Yes, 2002 BSO? no   SOCIAL HISTORY: (updated 11/2019)  Halimah is currently working for [employer]. Husband Denyse Amass is . Son Katherine Basset, age 47, is self-employed in [place], [place]. Son Bison, age 36, lives here in [place].     ADVANCED DIRECTIVES: In the absence of any documentation to the contrary, the patient's spouse is their HCPOA.    HEALTH MAINTENANCE: Social History   Tobacco Use   Smoking status: Never Smoker   Smokeless tobacco: Never Used  Vaping Use   Vaping Use: Never used  Substance Use Topics   Alcohol use: No   Drug use: No     Colonoscopy: 02/2019 (Dr. Collene Mares), repeat due 2025  PAP: none  on file (s/p hysterectomy)  Bone density: never done   Allergies  Allergen Reactions   Hydromorphone Rash    Arm reddened at IV med site   Other Itching and Rash    NUTS CAUSE MOUTH TO [facility]    Current Outpatient Medications  Medication Sig Dispense Refill   ALPRAZolam (XANAX) 1 MG tablet Take 1 tablet 1 hour prior to procedure.  Then take 1 tablet right before procedure if needed (Patient not taking: Reported on 12/24/2019) 2 tablet 0   aspirin EC 81 MG tablet Take 81 mg by mouth daily. Swallow whole.     cetirizine (ZYRTEC) 10 MG tablet Take 10 mg by mouth daily.     Cholecalciferol (VITAMIN D3) 5000 UNITS TABS Take 5,000 Units by mouth daily.      glucose blood (CONTOUR NEXT TEST) test strip Check blood sugar 1 time daily-DX-E11.9 100 each 12   ibuprofen (ADVIL) 800 MG tablet Take 1 tablet (800 mg total) by mouth every 8 (eight) hours as needed. 30 tablet 0   lidocaine-prilocaine (EMLA) cream Apply to affected area once 30 g 3   losartan (COZAAR) 50 MG tablet Take 1/2-1 tab daily for blood pressure and to protect kidneys. (Patient taking differently: Take 50 mg by mouth daily. ) 90 tablet 1   metFORMIN (GLUCOPHAGE XR) 500 MG 24 hr tablet Take 1 tablet (500 mg total) by mouth daily with breakfast. 30 tablet 11   Omega-3 Fatty Acids (OMEGA 3 PO) Take 1 capsule by mouth 2 (two) times daily. (Patient not taking: Reported on 12/24/2019)     OVER THE COUNTER MEDICATION Takes CoQ10 1 capsule daily (Patient not taking: Reported on 12/24/2019)     oxyCODONE (OXY IR/ROXICODONE) 5 MG immediate release tablet Take 1 tablet (5 mg total) by mouth every 6 (six) hours as needed for severe pain. 15 tablet 0   pantoprazole (PROTONIX) 40 MG tablet Take 40 mg by mouth daily.      prochlorperazine (COMPAZINE) 10 MG tablet Take 1 tablet (10 mg total) by mouth every 6 (six) hours as needed (Nausea or vomiting). 30 tablet 1   rosuvastatin (CRESTOR) 5 MG tablet Take 1 tablet daily for Cholesterol  (Patient taking differently: Take 5 mg by mouth daily. ) 90 tablet 1   sulfamethoxazole-trimethoprim (BACTRIM DS) 800-160 MG tablet Take 1 tablet by mouth 2 (two) times daily. (Patient not taking: Reported on 12/24/2019)  10 tablet 0   No current facility-administered medications for this visit.    OBJECTIVE: African-American woman who appears younger than stated age  41:   01/14/20 1125  BP: (!) 153/99  Pulse: 92  Resp: 18  Temp: 98.3 F (36.8 C)  SpO2: 100%     Body mass index is 37.03 kg/m.   Wt Readings from Last 3 Encounters:  01/14/20 207 lb 6.4 oz (94.1 kg)  01/02/20 (!) 210 lb (95.3 kg)  12/26/19 212 lb 9 oz (96.4 kg)      ECOG FS:1 - Symptomatic but completely ambulatory  Sclerae unicteric, EOMs intact Wearing a mask No cervical or supraclavicular adenopathy Lungs no rales or rhonchi Heart regular rate and rhythm Abd soft, nontender, positive bowel sounds MSK no focal spinal tenderness, no upper extremity lymphedema Neuro: nonfocal, well oriented, appropriate affect Breasts: Status post bilateral mastectomies with binder in place.  The incisions appear to be healing nicely, with no dehiscence swelling or erythema.  Drains in place with approximately 8 cc of fluid in the bulb, slightly darker on the left than the right   LAB RESULTS:  CMP     Component Value Date/Time   NA 139 01/03/2020 0140   K 4.2 01/03/2020 0140   CL 105 01/03/2020 0140   CO2 27 01/03/2020 0140   GLUCOSE 159 (H) 01/03/2020 0140   BUN 11 01/03/2020 0140   CREATININE 1.05 (H) 01/03/2020 0140   CREATININE 0.94 11/28/2019 1217   CREATININE 0.82 11/26/2019 1533   CALCIUM 8.3 (L) 01/03/2020 0140   PROT 7.6 11/28/2019 1217   ALBUMIN 3.9 11/28/2019 1217   AST 10 (L) 11/28/2019 1217   ALT 16 11/28/2019 1217   ALKPHOS 75 11/28/2019 1217   BILITOT 0.6 11/28/2019 1217   GFRNONAA 56 (L) 01/03/2020 0140   GFRNONAA >60 11/28/2019 1217   GFRNONAA 76 11/26/2019 1533   GFRAA >60 01/03/2020  0140   GFRAA >60 11/28/2019 1217   GFRAA 88 11/26/2019 1533    Lab Results  Component Value Date   ALBUMINELP 3.9 08/23/2019   A1GS 0.4 (H) 08/23/2019   A2GS 0.9 08/23/2019   BETS 0.5 08/23/2019   BETA2SER 0.4 08/23/2019   GAMS 0.9 08/23/2019   SPEI  08/23/2019     Comment:     . Alpha-1 globulin increase noted. .     Lab Results  Component Value Date   WBC 11.0 (H) 01/03/2020   NEUTROABS 5.4 11/28/2019   HGB 10.9 (L) 01/03/2020   HCT 33.4 (L) 01/03/2020   MCV 92.0 01/03/2020   PLT 239 01/03/2020    No results found for: LABCA2  No components found for: [id number]  No results for input(s): INR in the last 168 hours.  No results found for: LABCA2  No results found for: CVK184  No results found for: CRF543  No results found for: KGO770  No results found for: CA2729  No components found for: HGQUANT  No results found for: CEA1 / No results found for: CEA1   No results found for: AFPTUMOR  No results found for: CHROMOGRNA  No results found for: KPAFRELGTCHN, LAMBDASER, KAPLAMBRATIO (kappa/lambda light chains)  No results found for: HGBA, HGBA2QUANT, HGBFQUANT, HGBSQUAN (Hemoglobinopathy evaluation)   No results found for: LDH  Lab Results  Component Value Date   IRON 54 11/26/2019   TIBC 378 11/26/2019   IRONPCTSAT 14 (L) 11/26/2019   (Iron and TIBC)  Lab Results  Component Value Date   FERRITIN 91 05/21/2015  Urinalysis    Component Value Date/Time   COLORURINE YELLOW 11/26/2019 1533   APPEARANCEUR CLOUDY (A) 11/26/2019 1533   LABSPEC 1.019 11/26/2019 1533   PHURINE < OR = 5.0 11/26/2019 1533   GLUCOSEU 1+ (A) 11/26/2019 1533   HGBUR NEGATIVE 11/26/2019 1533   BILIRUBINUR NEG 11/22/2013 1031   BILIRUBINUR neg 10/28/2013 1159   KETONESUR NEGATIVE 11/26/2019 1533   PROTEINUR 2+ (A) 11/26/2019 1533   UROBILINOGEN 0.2 11/22/2013 1031   NITRITE NEGATIVE 08/13/2019 1359   LEUKOCYTESUR 1+ (A) 08/13/2019 1359    STUDIES: NM Sentinel  Node Inj-No Rpt (Breast)  Result Date: 01/02/2020 Sulfur colloid was injected by the nuclear medicine technologist for melanoma sentinel node.   DG Chest Port 1 View  Result Date: 01/02/2020 CLINICAL DATA:  Port-A-Cath placement EXAM: PORTABLE CHEST 1 VIEW COMPARISON:  10/28/2013 FINDINGS: Port-A-Cath placed on the right via the internal jugular vein. Tip is in the proximal right atrium. No pneumothorax. Heart is enlarged. There may be mild fluid overload or interstitial edema. Mild bibasilar atelectasis. Soft tissue drain in the right chest region. IMPRESSION: Port-A-Cath tip in the proximal right atrium. Question mild fluid overload/edema.  Mild basilar atelectasis. Electronically Signed   By: Nelson Chimes M.D.   On: 01/02/2020 16:33   DG Fluoro Guide CV Line-No Report  Result Date: 01/02/2020 Fluoroscopy was utilized by the requesting physician.  No radiographic interpretation.     ELIGIBLE FOR AVAILABLE RESEARCH PROTOCOL: AET  ASSESSMENT: 64 y.o. [place] woman status post right breast upper outer quadrant biopsy 11/20/2019 for a clinical mT1b N0, stage IA invasive ductal carcinoma, grade 2 or 3, estrogen and progesterone receptor positive, HER-2 amplified, with an MIB-1 of 20%  (1) status post bilateral mastectomies 01/02/2020 showing  (a) on the right, an mpT2 pN0(i+), stage IB invasive ductal carcinoma, triple positive, with close but negative margins; 2 sentinel lymph nodes removed  (b) on the left, no malignancy identified  (c) the patient is not planning on reconstruction  (2) adjuvant chemo immunotherapy will consist of Abraxane weekly x12 with trastuzumab  (a) echo 12/13/2019 shows an ejection fraction in the 55-60% range  (3) trastuzumab to be continued to total 1 year  (4) adjuvant radiation  (5) genetics testing 12/16/2019 through the [facility] Breast Cancer STAT panel + Common Hereditary Cancers panel found no deleterious mutations in ATM, BRCA1, BRCA2, CDH1, CHEK2,  PALB2, PTEN, STK11 and TP53. The Common Hereditary Cancers Panel offered by [facility] includes sequencing and/or deletion duplication testing of the following 48 genes: APC, ATM, AXIN2, BARD1, BMPR1A, BRCA1, BRCA2, BRIP1, CDH1, CDK4, CDKN2A (p14ARF), CDKN2A (p16INK4a), CHEK2, CTNNA1, DICER1, EPCAM (Deletion/duplication testing only), GREM1 (promoter region deletion/duplication testing only), KIT, MEN1, MLH1, MSH2, MSH3, MSH6, MUTYH, NBN, NF1, NTHL1, PALB2, PDGFRA, PMS2, POLD1, POLE, PTEN, RAD50, RAD51C, RAD51D, RNF43, SDHB, SDHC, SDHD, SMAD4, SMARCA4. STK11, TP53, TSC1, TSC2, and VHL.  The following genes were evaluated for sequence changes only: SDHA and HOXB13 c.251G>A variant only.  (a) Two variants of uncertain significance (VUS) were detected - one in the BRIP1 gene called c.1735C>T and a second in the POLD1 gene called c.2861C>G.   (6) antiestrogens to follow-up   PLAN: I reviewed the pathology results with the patient and her husband.  She will benefit from mild chemotherapy, which will be Taxol based.  It would be much better for her if she received Abraxane since Abraxane does not require steroid premedication as Taxol does.  Otherwise they are similar in effectiveness and side effects.  We also  discussed the trastuzumab she will receive.  She already had a very favorable echocardiogram.  This will need to be repeated every 3 months during the year of trastuzumab treatment.  She is meeting with our chemotherapy teaching nurse to get more details regarding possible side effects toxicities and complications and how to deal with them.  She will have a tour of the treatment area.  Also today I wrote her a prescription for bras as well as for cranial prosthesis.  She will see is with the first 3 Abraxane treatments and after that we will see her every other dose  She knows to call for any other issue that may develop before the next visit  Total encounter time 40 minutes.Sarajane Jews C.  Nadege Carriger, MD 01/14/2020 12:59 PM Medical Oncology and Hematology [facility] [place], [place] [address] Tel. [phone]    Fax. [phone]   This document serves as a record of services personally performed by Lurline Del, MD. It was created on his behalf by Wilburn Mylar, a trained medical scribe. The creation of this record is based on the scribe's personal observations and the provider's statements to them.   I, Lurline Del MD, have reviewed the above documentation for accuracy and completeness, and I agree with the above.   *Total Encounter Time as defined by the Centers for Medicare and Medicaid Services includes, in addition to the face-to-face time of a patient visit (documented in the note above) non-face-to-face time: obtaining and reviewing outside history, ordering and reviewing medications, tests or procedures, care coordination (communications with other health care professionals or caregivers) and documentation in the medical record.

## 2020-01-14 ENCOUNTER — Inpatient Hospital Stay: Payer: 59

## 2020-01-14 ENCOUNTER — Other Ambulatory Visit: Payer: Self-pay

## 2020-01-14 ENCOUNTER — Inpatient Hospital Stay: Payer: 59 | Attending: Oncology | Admitting: Oncology

## 2020-01-14 VITALS — BP 153/99 | HR 92 | Temp 98.3°F | Resp 18 | Ht 62.75 in | Wt 207.4 lb

## 2020-01-14 DIAGNOSIS — I129 Hypertensive chronic kidney disease with stage 1 through stage 4 chronic kidney disease, or unspecified chronic kidney disease: Secondary | ICD-10-CM | POA: Diagnosis not present

## 2020-01-14 DIAGNOSIS — Z9013 Acquired absence of bilateral breasts and nipples: Secondary | ICD-10-CM | POA: Diagnosis not present

## 2020-01-14 DIAGNOSIS — G4730 Sleep apnea, unspecified: Secondary | ICD-10-CM | POA: Diagnosis not present

## 2020-01-14 DIAGNOSIS — Z5189 Encounter for other specified aftercare: Secondary | ICD-10-CM | POA: Diagnosis not present

## 2020-01-14 DIAGNOSIS — C50411 Malignant neoplasm of upper-outer quadrant of right female breast: Secondary | ICD-10-CM | POA: Insufficient documentation

## 2020-01-14 DIAGNOSIS — Z806 Family history of leukemia: Secondary | ICD-10-CM | POA: Insufficient documentation

## 2020-01-14 DIAGNOSIS — Z7984 Long term (current) use of oral hypoglycemic drugs: Secondary | ICD-10-CM | POA: Diagnosis not present

## 2020-01-14 DIAGNOSIS — E1165 Type 2 diabetes mellitus with hyperglycemia: Secondary | ICD-10-CM

## 2020-01-14 DIAGNOSIS — N189 Chronic kidney disease, unspecified: Secondary | ICD-10-CM | POA: Diagnosis not present

## 2020-01-14 DIAGNOSIS — Z5111 Encounter for antineoplastic chemotherapy: Secondary | ICD-10-CM | POA: Diagnosis not present

## 2020-01-14 DIAGNOSIS — Z803 Family history of malignant neoplasm of breast: Secondary | ICD-10-CM | POA: Insufficient documentation

## 2020-01-14 DIAGNOSIS — E559 Vitamin D deficiency, unspecified: Secondary | ICD-10-CM

## 2020-01-14 DIAGNOSIS — Z801 Family history of malignant neoplasm of trachea, bronchus and lung: Secondary | ICD-10-CM | POA: Diagnosis not present

## 2020-01-14 DIAGNOSIS — Z79899 Other long term (current) drug therapy: Secondary | ICD-10-CM | POA: Insufficient documentation

## 2020-01-14 DIAGNOSIS — E785 Hyperlipidemia, unspecified: Secondary | ICD-10-CM | POA: Insufficient documentation

## 2020-01-14 DIAGNOSIS — Z170 Estrogen receptor positive status [ER+]: Secondary | ICD-10-CM | POA: Insufficient documentation

## 2020-01-14 MED ORDER — PROCHLORPERAZINE MALEATE 10 MG PO TABS: 10.0000 mg | ORAL_TABLET | Freq: Four times a day (QID) | ORAL | 1 refills | Status: DC | PRN

## 2020-01-14 MED ORDER — LIDOCAINE-PRILOCAINE 2.5-2.5 % EX CREA: TOPICAL_CREAM | CUTANEOUS | 3 refills | Status: DC

## 2020-01-15 ENCOUNTER — Encounter: Payer: Self-pay | Admitting: *Deleted

## 2020-01-15 ENCOUNTER — Encounter: Payer: 59 | Admitting: Adult Health

## 2020-01-15 ENCOUNTER — Telehealth: Payer: Self-pay | Admitting: Oncology

## 2020-01-15 NOTE — Telephone Encounter (Signed)
Scheduled appts between appts that were already on pt schedule per 8/9 los. Made no changes to pt's arrival time.

## 2020-01-17 ENCOUNTER — Other Ambulatory Visit: Payer: 59

## 2020-01-17 ENCOUNTER — Ambulatory Visit: Payer: 59

## 2020-01-21 NOTE — H&P (Signed)
Tracy Garrett Appointment: 11/28/2019 1:00 PM Location: [facility] Patient #: [id number] DOB: [birth year] Married / Language: English / Race: Black or African American Female  History of Present Illness Tracy Moores A. 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  The patient is a 64 year old female.   Past Surgical History Tracy Slipper, RN; 11/28/2019 8:14 AM) Breast Biopsy Right. Breast Reconstruction Bilateral. Gallbladder Surgery - Laparoscopic Hysterectomy (not due to cancer) - Partial Oral Surgery Tonsillectomy  Diagnostic Studies History Tracy Slipper, RN; 11/28/2019 8:14 AM) Colonoscopy within last year Mammogram within last year Pap Smear 1-5 years ago >5 years ago  Medication History Tracy Slipper, RN; 11/28/2019 8:14 AM) Medications Reconciled  Social History Tracy Slipper, RN; 11/28/2019 8:14 AM) No alcohol use No caffeine use No drug use Tobacco use Never smoker.  Family History Tracy Slipper, RN; 11/28/2019 8:14 AM) Breast Cancer Mother. Diabetes Mellitus Brother, Sister. Heart Disease Brother. Hypertension Father, Mother. Kidney Disease Brother.  Pregnancy / Birth History Tracy Slipper, RN; 11/28/2019 8:14 AM) Age at menarche 74 years. Gravida 2 Maternal age 64-25 Para 2 Regular periods  Other Problems Tracy Slipper, RN; 11/28/2019 8:14 AM) Arthritis Back Pain Cancer Cholelithiasis Diabetes Mellitus Gastroesophageal Reflux Disease General anesthesia - complications High blood pressure Hypercholesterolemia Kidney Stone Lump In Breast Transfusion history     Review of Systems (Tracy Piercefield A. Rakayla Ricklefs MD; 11/28/2019 2:45 PM) General Not Present- Appetite Loss, Chills, Fatigue, Fever, Night  Sweats, Weight Gain and Weight Loss. Skin Not Present- Change in Wart/Mole, Dryness, Hives, Jaundice, New Lesions, Non-Healing Wounds, Rash and Ulcer. HEENT Present- Seasonal Allergies and Wears glasses/contact lenses. Not Present- Earache, Hearing Loss, Hoarseness, Nose Bleed, Oral Ulcers, Ringing in the Ears, Sinus Pain, Sore Throat, Visual Disturbances and Yellow Eyes. Respiratory Not Present- Bloody sputum, Chronic Cough, Difficulty Breathing, Snoring and Wheezing. Breast Present- Breast Pain. Not Present- Breast Mass, Nipple Discharge and Skin Changes. Cardiovascular Not Present- Chest Pain, Difficulty Breathing Lying Down, Leg Cramps, Palpitations, Rapid Heart Rate, Shortness of Breath and Swelling of Extremities. Gastrointestinal Not Present- Abdominal Pain, Bloating, Bloody Stool, Change in Bowel Habits, Chronic diarrhea, Constipation, Difficulty Swallowing, Excessive gas, Gets full quickly at meals, Hemorrhoids, Indigestion, Nausea, Rectal Pain and Vomiting. Female Genitourinary Not Present- Frequency, Nocturia, Painful Urination, Pelvic Pain and Urgency. Musculoskeletal Present- Back Pain. Not Present- Joint Pain, Joint Stiffness, Muscle Pain, Muscle Weakness and Swelling of Extremities. Neurological Not Present- Decreased Memory, Fainting, Headaches, Numbness, Seizures, Tingling, Tremor, Trouble walking and Weakness. Psychiatric Not Present- Anxiety, Bipolar, Change in Sleep Pattern, Depression, Fearful and Frequent crying. Endocrine Present- New Diabetes. Not Present- Cold Intolerance, Excessive Hunger, Hair Changes, Heat Intolerance and Hot flashes. Hematology Not Present- Blood Thinners, Easy Bruising, Excessive bleeding, Gland problems, HIV and Persistent Infections. All other systems negative   Physical Exam (Tracy Leedy A. Leigha Olberding MD; 11/28/2019 2:47 PM)  General Mental Status-Alert. General Appearance-Consistent with stated age. Hydration-Well  hydrated. Voice-Normal.  Head and Neck Head-normocephalic, atraumatic with no lesions or palpable masses. Trachea-midline. Thyroid Gland Characteristics - normal size and consistency.  Eye Eyeball - Bilateral-Extraocular movements intact. Sclera/Conjunctiva - Bilateral-No scleral icterus.  Chest and Lung Exam Chest and lung exam reveals -quiet, even and easy respiratory effort with no use of accessory muscles and on auscultation, normal breath sounds, no adventitious sounds and  normal vocal resonance. Inspection Chest Wall - Normal. Back - normal.  Breast Breast - Left-Symmetric, Non Tender, No Biopsy scars, no Dimpling - Left, No Inflammation, No Lumpectomy scars, No Mastectomy scars, No Peau d' Orange. Breast - Right-Symmetric, Non Tender, No Biopsy scars, no Dimpling - Right, No Inflammation, No Lumpectomy scars, No Mastectomy scars, No Peau d' Orange. Breast Lump-No Palpable Breast Mass.  Cardiovascular Cardiovascular examination reveals -normal heart sounds, regular rate and rhythm with no murmurs and normal pedal pulses bilaterally.  Abdomen Inspection Inspection of the abdomen reveals - No Hernias. Skin - Scar - no surgical scars. Palpation/Percussion Palpation and Percussion of the abdomen reveal - Soft, Non Tender, No Rebound tenderness, No Rigidity (guarding) and No hepatosplenomegaly. Auscultation Auscultation of the abdomen reveals - Bowel sounds normal.  Neurologic Neurologic evaluation reveals -alert and oriented x 3 with no impairment of recent or remote memory. Mental Status-Normal.  Musculoskeletal Normal Exam - Left-Upper Extremity Strength Normal and Lower Extremity Strength Normal. Normal Exam - Right-Upper Extremity Strength Normal and Lower Extremity Strength Normal.  Lymphatic Head & Neck  General Head & Neck Lymphatics: Bilateral - Description - Normal. Axillary  General Axillary Region: Bilateral -  Description - Normal. Tenderness - Non Tender. Femoral & Inguinal  Generalized Femoral & Inguinal Lymphatics: Bilateral - Description - Normal. Tenderness - Non Tender.    Assessment & Plan (Tracy Nouri A. Darling Cieslewicz MD; 11/28/2019 2:49 PM)  BREAST CANCER, STAGE 1, RIGHT (C50.911) Impression: triple positive opted for right breast lumpectomy / SLN / PORT PLACEMENT Risk of lumpectomy include bleeding, infection, seroma, more surgery, use of seed/wire, wound care, cosmetic deformity and the need for other treatments, death , blood clots, death. Pt agrees to proceed. Risk of sentinel lymph node mapping include bleeding, infection, lymphedema, shoulder pain. stiffness, dye allergy. cosmetic deformity , blood clots, death, need for more surgery. Pt agrees to proceed. Pt requires port placement for chemotherapy. Risk include bleeding, infection, pneumothorax, hemothorax, mediastinal injury, nerve injury , blood vessel injury, stroke, blood clots, death, migration. embolization and need for additional procedures. Pt agrees to proceed.  Current Plans You are being scheduled for surgery- Our schedulers will call you.  You should hear from our office's scheduling department within 5 working days about the location, date, and time of surgery. We try to make accommodations for patient's preferences in scheduling surgery, but sometimes the OR schedule or the surgeon's schedule prevents [place] from making those accommodations.  If you have not heard from our office [phone]) in 5 working days, call the office and ask for your surgeon's nurse.  If you have other questions about your diagnosis, plan, or surgery, call the office and ask for your surgeon's nurse.  We discussed the staging and pathophysiology of breast cancer. We discussed all of the different options for treatment for breast cancer including surgery, chemotherapy, radiation therapy, Herceptin, and antiestrogen therapy. We discussed a  sentinel lymph node biopsy as she does not appear to having lymph node involvement right now. We discussed the performance of that with injection of radioactive tracer and blue dye. We discussed that she would have an incision underneath her axillary hairline. We discussed that there is a bout a 10-20% chance of having a positive node with a sentinel lymph node biopsy and we will await the permanent pathology to make any other first further decisions in terms of her treatment. One of these options might be to return to the operating room to perform an axillary lymph node dissection. We  discussed about a 1-2% risk lifetime of chronic shoulder pain as well as lymphedema associated with a sentinel lymph node biopsy. We discussed the options for treatment of the breast cancer which included lumpectomy versus a mastectomy. We discussed the performance of the lumpectomy with a wire placement. We discussed a 10-20% chance of a positive margin requiring reexcision in the operating room. We also discussed that she may need radiation therapy or antiestrogen therapy or both if she undergoes lumpectomy. We discussed the mastectomy and the postoperative care for that as well. We discussed that there is no difference in her survival whether she undergoes lumpectomy with radiation therapy or antiestrogen therapy versus a mastectomy. There is a slight difference in the local recurrence rate being 3-5% with lumpectomy and about 1% with a mastectomy. We discussed the risks of operation including bleeding, infection, possible reoperation. She understands her further therapy will be based on what her stages at the time of her operation.  Pt Education - [facility] breast cancer surgery: discussed with patient and provided information. Pt Education - [facility] Breast Biopsy [facility]: discussed with patient and provided information.

## 2020-01-24 ENCOUNTER — Other Ambulatory Visit: Payer: 59

## 2020-01-24 ENCOUNTER — Ambulatory Visit: Payer: 59

## 2020-01-29 NOTE — Progress Notes (Signed)
The following biosimilar Kanjinti (trastuzumab-anns) has been selected for use in this patient.  Kennith Center, Pharm.D., CPP 01/29/2020@8 :40 AM

## 2020-01-31 ENCOUNTER — Other Ambulatory Visit: Payer: 59

## 2020-01-31 ENCOUNTER — Ambulatory Visit: Payer: 59

## 2020-02-03 NOTE — Progress Notes (Signed)
Tracy Garrett  [phone] [phone]     ID: Tracy Garrett DOB: 08/25/1955  MR#: [id number]  POE#:[id number]  Patient Care Team: Unk Pinto, MD as PCP - General (Internal Medicine) [name], RN as Oncology Nurse Navigator [name], RN as Oncology Nurse Navigator Erroll Luna, MD as Consulting Physician (General Surgery) Eddie Koc, Virgie Dad, MD as Consulting Physician (Oncology) Eppie Gibson, MD as Attending Physician (Radiation Oncology) Mcarthur Rossetti, MD as Consulting Physician (Orthopedic Surgery) Regal, Tamala Fothergill, DPM as Consulting Physician (Podiatry) Chauncey Cruel, MD OTHER MD:  CHIEF COMPLAINT: triple positive breast cancer (s/p bilateral mastectomies)  CURRENT TREATMENT: Adjuvant chemo immunotherapy   INTERVAL HISTORY: Tracy Garrett returns today for follow up and treatment of her triple positive breast cancer accompanied by her husband.  She has met with our chemotherapy teaching nurse, had her port placed, and is ready to start her first cycle of Abraxane and trastuzumab weekly today.  Her most recent echocardiogram performed on 12/13/2019 showed an ejection fraction of 55-60%.   REVIEW OF SYSTEMS: Tracy Garrett has a little bit of discomfort in the anterior left chest and a little bit in the left axilla.  Otherwise she has recovered very nicely from her surgery.  She has no unusual headaches visual changes cough phlegm production pleurisy or shortness of breath or change in bowel or bladder habits.  Detailed review of systems today was stable.   HISTORY OF CURRENT ILLNESS: From the original intake note:  Tracy Garrett had routine screening mammography on 05/01/2020 showing a possible abnormality in the right breast. She underwent right diagnostic mammography with tomography at [facility] on 05/09/2019 showing: breast density category A; probably-benign regional calcifications in the right breast at 11  o'clock. Short term follow up was recommended.   She returned for follow up at [facility] on 11/13/2019. She underwent right diagnostic mammography and right breast ultrasonography showing: breast composition A; indeterminate calcifications in upper-outer right breast spanning 5 cm; 0.8 cm irregular mass in right breast at 9 o'clock.  Accordingly on 11/20/2019 she proceeded to biopsy of the right breast areas in question. The pathology from this procedure ([id number]) showed:  1. Right breast, UOQ  - foci of invasive and in situ mammary carcinoma, grade 2-3, e-cadherin positive.  2. Right breast, 9 o'clock  - invasive mammary carcinoma, grade 2-3, e-cadherin positive  - Prognostic indicators significant for: estrogen receptor, 95% positive and progesterone receptor, 25% positive, both with strong staining intensity. Proliferation marker Ki67 at 20%. HER2 equivocal by immunohistochemistry (2+), but positive by fluorescent in situ hybridization with a signals ratio 3.30 and number per cell 7.25.  The patient's subsequent history is as detailed below.   PAST MEDICAL HISTORY: Past Medical History:  Diagnosis Date  . Allergy   . Anemia   . Arthritis   . Cancer [facility])    right breast cancer  . Cataracts, bilateral   . Chronic kidney disease    CKD  . Family history of breast cancer   . Family history of multiple myeloma   . Fatty liver disease, nonalcoholic 11/24/5091   [place] appears normal on follow up US 09/2019  . GERD (gastroesophageal reflux disease)   . History of kidney stones    x2 episodes  . Hyperlipidemia   . Hypertension   . PONV (postoperative nausea and vomiting)   . Sleep apnea    test was dx, as mild- no further tx. required  . Type II or unspecified type diabetes  mellitus without mention of complication, not stated as uncontrolled     PAST SURGICAL HISTORY: Past Surgical History:  Procedure Laterality Date  . BREAST SURGERY  1999   Reduction Mammoplasties  . CHOLECYSTECTOMY   2003  . colonoscopsy  11/2007   neg- polyp removal with both procedures x2  . CYSTOSCOPY     surgery to extract stone x 1 with stent  . EUS N/A 07/26/2014   Procedure: UPPER ENDOSCOPIC ULTRASOUND (EUS) LINEAR;  Surgeon: Beryle Beams, MD;  Location: [facility];  Service: Endoscopy;  Laterality: N/A;  . 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 LYSIS OF ADHESION  10/10/2014   Procedure: LYSIS OF ADHESION;  Surgeon: [name], MD;  Location: [facility];  Service: General;;  . MASTECTOMY W/ SENTINEL NODE BIOPSY Bilateral 01/02/2020   Procedure: BILATERAL MASTECTOMY WITH RIGHT SENTINEL LYMPH NODE MAPPING;  Surgeon: Erroll Luna, MD;  Location: [place];  Service: General;  Laterality: Bilateral;  PEC BLOCK  . PORTACATH PLACEMENT Right 01/02/2020   Procedure: INSERTION PORT-A-CATH WITH ULTRASOUND GUIDANCE;  Surgeon: Erroll Luna, MD;  Location: [place];  Service: General;  Laterality: Right;  . TONSILLECTOMY     and adenoids removed age 37  . VAGINAL HYSTERECTOMY  2002    FAMILY HISTORY: Family History  Problem Relation Age of Onset  . Breast cancer Mother 43  . Heart disease Father        smoker  . Diabetes type I Half-Sister   . Alzheimer's disease Maternal Grandmother   . Emphysema Paternal Grandfather        smoker  . Heart attack Paternal Grandfather   . Lung cancer Paternal Grandfather   . CAD Brother 30       stents  . Breast cancer Maternal Aunt        dx. in her late 61s  . Breast cancer Cousin 26  . Cancer Maternal Uncle        dx. in his 85s; tumor on the back of neck  . Breast cancer Paternal Aunt        dx. >50  . Multiple myeloma Maternal Aunt 92  . Multiple myeloma Paternal Aunt        dx. >50  . Breast cancer Cousin 58   Her father died at age 85. Her mother died at age 13. She has 2 brothers and had 1 sister who  is deceased. She reports breast cancer on her maternal side-- her mother at age 50, an aunt, and a cousin.   GYNECOLOGIC HISTORY:  No LMP recorded. Patient has had a hysterectomy. Menarche: 64 years old Age at first live birth: 64 years old [place] P 2 LMP unsure Contraceptive: never used HRT never used  Hysterectomy? Yes, 2002 BSO? no   SOCIAL HISTORY: (updated 11/2019)  Miasha is currently working for [employer]. Husband Denyse Amass is       . Son Katherine Basset, age 82, is self-employed in [place], [place]. Son Wapella, age 83, lives here in [place].     ADVANCED DIRECTIVES: In the absence of any documentation to the contrary, the patient's spouse is their HCPOA.    HEALTH MAINTENANCE: Social History   Tobacco Use  . Smoking status: Never Smoker  . Smokeless tobacco: Never Used  Vaping Use  . Vaping Use: Never used  Substance Use Topics  .  Alcohol use: No  . Drug use: No     Colonoscopy: 02/2019 (Dr. Collene Mares), repeat due 2025  PAP: none on file (s/p hysterectomy)  Bone density: never done   Allergies  Allergen Reactions  . Hydromorphone Rash    Arm reddened at IV med site  . Other Itching and Rash    NUTS CAUSE MOUTH TO [facility]    Current Outpatient Medications  Medication Sig Dispense Refill  . ALPRAZolam (XANAX) 1 MG tablet Take 1 tablet 1 hour prior to procedure.  Then take 1 tablet right before procedure if needed (Patient not taking: Reported on 12/24/2019) 2 tablet 0  . aspirin EC 81 MG tablet Take 81 mg by mouth daily. Swallow whole.    . cetirizine (ZYRTEC) 10 MG tablet Take 10 mg by mouth daily.    . Cholecalciferol (VITAMIN D3) 5000 UNITS TABS Take 5,000 Units by mouth daily.     Marland Kitchen glucose blood (CONTOUR NEXT TEST) test strip Check blood sugar 1 time daily-DX-E11.9 100 each 12  . ibuprofen (ADVIL) 800 MG tablet Take 1 tablet (800 mg total) by mouth every 8 (eight) hours as needed. 30 tablet 0  . lidocaine-prilocaine (EMLA) cream Apply to affected area once 30  g 3  . losartan (COZAAR) 50 MG tablet Take 1/2-1 tab daily for blood pressure and to protect kidneys. (Patient taking differently: Take 50 mg by mouth daily. ) 90 tablet 1  . metFORMIN (GLUCOPHAGE XR) 500 MG 24 hr tablet Take 1 tablet (500 mg total) by mouth daily with breakfast. 30 tablet 11  . Omega-3 Fatty Acids (OMEGA 3 PO) Take 1 capsule by mouth 2 (two) times daily. (Patient not taking: Reported on 12/24/2019)    . OVER THE COUNTER MEDICATION Takes CoQ10 1 capsule daily (Patient not taking: Reported on 12/24/2019)    . oxyCODONE (OXY IR/ROXICODONE) 5 MG immediate release tablet Take 1 tablet (5 mg total) by mouth every 6 (six) hours as needed for severe pain. 15 tablet 0  . pantoprazole (PROTONIX) 40 MG tablet Take 40 mg by mouth daily.     . prochlorperazine (COMPAZINE) 10 MG tablet Take 1 tablet (10 mg total) by mouth every 6 (six) hours as needed (Nausea or vomiting). 30 tablet 1  . rosuvastatin (CRESTOR) 5 MG tablet Take 1 tablet daily for Cholesterol (Patient taking differently: Take 5 mg by mouth daily. ) 90 tablet 1  . sulfamethoxazole-trimethoprim (BACTRIM DS) 800-160 MG tablet Take 1 tablet by mouth 2 (two) times daily. (Patient not taking: Reported on 12/24/2019) 10 tablet 0   No current facility-administered medications for this visit.    OBJECTIVE: African-American woman who appears younger than stated age  64:   02/04/20 0936  BP: (!) 163/83  Pulse: 78  Resp: 18  Temp: (!) 97.4 F (36.3 C)  SpO2: 100%     Body mass index is 37.87 kg/m.   Wt Readings from Last 3 Encounters:  02/04/20 212 lb 1.6 oz (96.2 kg)  01/14/20 207 lb 6.4 oz (94.1 kg)  01/02/20 (!) 210 lb (95.3 kg)      ECOG FS:1 - Symptomatic but completely ambulatory  Sclerae unicteric, EOMs intact Wearing a mask No cervical or supraclavicular adenopathy Lungs no rales or rhonchi Heart regular rate and rhythm Abd soft, nontender, positive bowel sounds MSK no focal spinal tenderness, no upper  extremity lymphedema Neuro: nonfocal, well oriented, appropriate affect Breasts: Status post bilateral mastectomies.  The incisions are healing very nicely, with no dehiscence, erythema, or  swelling.  Both axillae are benign.   LAB RESULTS:  CMP     Component Value Date/Time   NA 139 01/03/2020 0140   K 4.2 01/03/2020 0140   CL 105 01/03/2020 0140   CO2 27 01/03/2020 0140   GLUCOSE 159 (H) 01/03/2020 0140   BUN 11 01/03/2020 0140   CREATININE 1.05 (H) 01/03/2020 0140   CREATININE 0.94 11/28/2019 1217   CREATININE 0.82 11/26/2019 1533   CALCIUM 8.3 (L) 01/03/2020 0140   PROT 7.6 11/28/2019 1217   ALBUMIN 3.9 11/28/2019 1217   AST 10 (L) 11/28/2019 1217   ALT 16 11/28/2019 1217   ALKPHOS 75 11/28/2019 1217   BILITOT 0.6 11/28/2019 1217   GFRNONAA 56 (L) 01/03/2020 0140   GFRNONAA >60 11/28/2019 1217   GFRNONAA 76 11/26/2019 1533   GFRAA >60 01/03/2020 0140   GFRAA >60 11/28/2019 1217   GFRAA 88 11/26/2019 1533    Lab Results  Component Value Date   ALBUMINELP 3.9 08/23/2019   A1GS 0.4 (H) 08/23/2019   A2GS 0.9 08/23/2019   BETS 0.5 08/23/2019   BETA2SER 0.4 08/23/2019   GAMS 0.9 08/23/2019   SPEI  08/23/2019     Comment:     . Alpha-1 globulin increase noted. .     Lab Results  Component Value Date   WBC 9.5 02/04/2020   NEUTROABS 5.0 02/04/2020   HGB 12.0 02/04/2020   HCT 36.4 02/04/2020   MCV 91.7 02/04/2020   PLT 228 02/04/2020    No results found for: LABCA2  No components found for: [id number]  No results for input(s): INR in the last 168 hours.  No results found for: LABCA2  No results found for: DOI021  No results found for: YAW914  No results found for: LOQ691  No results found for: CA2729  No components found for: HGQUANT  No results found for: CEA1 / No results found for: CEA1   No results found for: AFPTUMOR  No results found for: CHROMOGRNA  No results found for: KPAFRELGTCHN, LAMBDASER, KAPLAMBRATIO (kappa/lambda light  chains)  No results found for: HGBA, HGBA2QUANT, HGBFQUANT, HGBSQUAN (Hemoglobinopathy evaluation)   No results found for: LDH  Lab Results  Component Value Date   IRON 54 11/26/2019   TIBC 378 11/26/2019   IRONPCTSAT 14 (L) 11/26/2019   (Iron and TIBC)  Lab Results  Component Value Date   FERRITIN 91 05/21/2015    Urinalysis    Component Value Date/Time   COLORURINE YELLOW 11/26/2019 1533   APPEARANCEUR CLOUDY (A) 11/26/2019 1533   LABSPEC 1.019 11/26/2019 1533   PHURINE < OR = 5.0 11/26/2019 1533   GLUCOSEU 1+ (A) 11/26/2019 1533   HGBUR NEGATIVE 11/26/2019 1533   BILIRUBINUR NEG 11/22/2013 1031   BILIRUBINUR neg 10/28/2013 1159   KETONESUR NEGATIVE 11/26/2019 1533   PROTEINUR 2+ (A) 11/26/2019 1533   UROBILINOGEN 0.2 11/22/2013 1031   NITRITE NEGATIVE 08/13/2019 1359   LEUKOCYTESUR 1+ (A) 08/13/2019 1359    STUDIES: No results found.   ELIGIBLE FOR AVAILABLE RESEARCH PROTOCOL: AET  ASSESSMENT: 64 y.o. [place] woman status post right breast upper outer quadrant biopsy 11/20/2019 for a clinical mT1b N0, stage IA invasive ductal carcinoma, grade 2 or 3, estrogen and progesterone receptor positive, HER-2 amplified, with an MIB-1 of 20%  (1) status post bilateral mastectomies 01/02/2020 showing  (a) on the right, an mpT2 pN0(i+), stage IB invasive ductal carcinoma, triple positive, with close but negative margins; 2 sentinel lymph nodes removed  (b) on the  left, no malignancy identified  (c) the patient is not planning on reconstruction  (2) adjuvant chemo immunotherapy will consist of Abraxane weekly x12 with trastuzumab  (a) echo 12/13/2019 shows an ejection fraction in the 55-60% range  (3) trastuzumab to be continued to total 1 year  (4) adjuvant radiation  (5) genetics testing 12/16/2019 through the [facility] Breast Cancer STAT panel + Common Hereditary Cancers panel found no deleterious mutations in ATM, BRCA1, BRCA2, CDH1, CHEK2, PALB2, PTEN, STK11 and  TP53. The Common Hereditary Cancers Panel offered by [facility] includes sequencing and/or deletion duplication testing of the following 48 genes: APC, ATM, AXIN2, BARD1, BMPR1A, BRCA1, BRCA2, BRIP1, CDH1, CDK4, CDKN2A (p14ARF), CDKN2A (p16INK4a), CHEK2, CTNNA1, DICER1, EPCAM (Deletion/duplication testing only), GREM1 (promoter region deletion/duplication testing only), KIT, MEN1, MLH1, MSH2, MSH3, MSH6, MUTYH, NBN, NF1, NTHL1, PALB2, PDGFRA, PMS2, POLD1, POLE, PTEN, RAD50, RAD51C, RAD51D, RNF43, SDHB, SDHC, SDHD, SMAD4, SMARCA4. STK11, TP53, TSC1, TSC2, and VHL.  The following genes were evaluated for sequence changes only: SDHA and HOXB13 c.251G>A variant only.  (a) Two variants of uncertain significance (VUS) were detected - one in the BRIP1 gene called c.1735C>T and a second in the POLD1 gene called c.2861C>G.   (6) antiestrogens to follow-up   PLAN: Eavan is ready to start her adjuvant chemotherapy and immunotherapy today.  She has a good understanding of the possible toxicities side effects and complications of these agents and these were reviewed with her again today.  She understands she may have a "first dose reaction" which would happen here and we would take care of.  She may also have bony aches for 2 or 3 days which is rare but not unheard of with this treatment.  If so she will take Tylenol and if that does not take care of it she will call us.  We discussed the issue with peripheral neuropathy and she will do cryo prevention and let us know if any numbness or tingling in her finger pads or toe pads develop.  We are going to see her again in 1 week and again in 2 weeks.  By then she will have lost her hair.  At this point she is not planning to get awake because of the heat but may use wraps or bun dinosaur hats.  She does have a prescription for weight however if she decides to do that  She had an echo in July.  She does not need a repeat echo until at least 2 months into her current  treatments, which would be mid October  She knows to call for any other issue that may develop before the next visit.  Total encounter time 30 minutes.Sarajane Jews C. Mellisa Arshad, MD 02/04/2020 9:38 AM Medical Oncology and Hematology [facility] [place], [place] [address] Tel. [phone]    Fax. [phone]   This document serves as a record of services personally performed by Lurline Del, MD. It was created on his behalf by Wilburn Mylar, a trained medical scribe. The creation of this record is based on the scribe's personal observations and the provider's statements to them.   I, Lurline Del MD, have reviewed the above documentation for accuracy and completeness, and I agree with the above.   *Total Encounter Time as defined by the Centers for Medicare and Medicaid Services includes, in addition to the face-to-face time of a patient visit (documented in the note above) non-face-to-face time: obtaining and reviewing outside history, ordering and reviewing medications, tests or  procedures, care coordination (communications with other health care professionals or caregivers) and documentation in the medical record.

## 2020-02-04 ENCOUNTER — Encounter: Payer: Self-pay | Admitting: *Deleted

## 2020-02-04 ENCOUNTER — Inpatient Hospital Stay: Payer: 59

## 2020-02-04 ENCOUNTER — Other Ambulatory Visit: Payer: Self-pay

## 2020-02-04 ENCOUNTER — Inpatient Hospital Stay: Payer: 59 | Admitting: Oncology

## 2020-02-04 VITALS — BP 163/83 | HR 78 | Temp 97.4°F | Resp 18 | Ht 62.75 in | Wt 212.1 lb

## 2020-02-04 DIAGNOSIS — Z170 Estrogen receptor positive status [ER+]: Secondary | ICD-10-CM

## 2020-02-04 DIAGNOSIS — C50411 Malignant neoplasm of upper-outer quadrant of right female breast: Secondary | ICD-10-CM

## 2020-02-04 DIAGNOSIS — Z95828 Presence of other vascular implants and grafts: Secondary | ICD-10-CM | POA: Insufficient documentation

## 2020-02-04 DIAGNOSIS — E1165 Type 2 diabetes mellitus with hyperglycemia: Secondary | ICD-10-CM

## 2020-02-04 DIAGNOSIS — E559 Vitamin D deficiency, unspecified: Secondary | ICD-10-CM

## 2020-02-04 LAB — CBC WITH DIFFERENTIAL/PLATELET
Abs Immature Granulocytes: 0.03 10*3/uL (ref 0.00–0.07)
Basophils Absolute: 0 10*3/uL (ref 0.0–0.1)
Basophils Relative: 0 %
Eosinophils Absolute: 0.3 10*3/uL (ref 0.0–0.5)
Eosinophils Relative: 3 %
HCT: 36.4 % (ref 36.0–46.0)
Hemoglobin: 12 g/dL (ref 12.0–15.0)
Immature Granulocytes: 0 %
Lymphocytes Relative: 34 %
Lymphs Abs: 3.2 10*3/uL (ref 0.7–4.0)
MCH: 30.2 pg (ref 26.0–34.0)
MCHC: 33 g/dL (ref 30.0–36.0)
MCV: 91.7 fL (ref 80.0–100.0)
Monocytes Absolute: 0.8 10*3/uL (ref 0.1–1.0)
Monocytes Relative: 9 %
Neutro Abs: 5 10*3/uL (ref 1.7–7.7)
Neutrophils Relative %: 54 %
Platelets: 228 10*3/uL (ref 150–400)
RBC: 3.97 MIL/uL (ref 3.87–5.11)
RDW: 13.6 % (ref 11.5–15.5)
WBC: 9.5 10*3/uL (ref 4.0–10.5)
nRBC: 0 % (ref 0.0–0.2)

## 2020-02-04 LAB — COMPREHENSIVE METABOLIC PANEL
ALT: 9 U/L (ref 0–44)
AST: 9 U/L — ABNORMAL LOW (ref 15–41)
Albumin: 3.7 g/dL (ref 3.5–5.0)
Alkaline Phosphatase: 76 U/L (ref 38–126)
Anion gap: 12 (ref 5–15)
BUN: 18 mg/dL (ref 8–23)
CO2: 26 mmol/L (ref 22–32)
Calcium: 9.8 mg/dL (ref 8.9–10.3)
Chloride: 101 mmol/L (ref 98–111)
Creatinine, Ser: 1.05 mg/dL — ABNORMAL HIGH (ref 0.44–1.00)
GFR calc Af Amer: >60 mL/min (ref >60)
GFR calc non Af Amer: 56 mL/min — ABNORMAL LOW (ref >60)
Glucose, Bld: 198 mg/dL — ABNORMAL HIGH (ref 70–99)
Potassium: 3.8 mmol/L (ref 3.5–5.1)
Sodium: 139 mmol/L (ref 135–145)
Total Bilirubin: 0.5 mg/dL (ref 0.3–1.2)
Total Protein: 7 g/dL (ref 6.5–8.1)

## 2020-02-04 MED ORDER — SODIUM CHLORIDE 0.9% FLUSH
10.0000 mL | INTRAVENOUS | Status: DC | PRN
  Administered 2020-02-04: 10 mL
  Filled 2020-02-04: qty 10

## 2020-02-04 MED ORDER — HEPARIN SOD (PORK) LOCK FLUSH 100 UNIT/ML IV SOLN
500.0000 [IU] | Freq: Once | INTRAVENOUS | Status: AC | PRN
  Administered 2020-02-04: 500 [IU]
  Filled 2020-02-04: qty 5

## 2020-02-04 MED ORDER — SODIUM CHLORIDE 0.9 % IV SOLN
Freq: Once | INTRAVENOUS | Status: AC
  Filled 2020-02-04: qty 250

## 2020-02-04 MED ORDER — PROCHLORPERAZINE EDISYLATE 10 MG/2ML IJ SOLN
INTRAMUSCULAR | Status: AC
  Filled 2020-02-04: qty 2

## 2020-02-04 MED ORDER — ACETAMINOPHEN 325 MG PO TABS
ORAL_TABLET | ORAL | Status: AC
  Filled 2020-02-04: qty 2

## 2020-02-04 MED ORDER — DIPHENHYDRAMINE HCL 50 MG/ML IJ SOLN
25.0000 mg | Freq: Once | INTRAMUSCULAR | Status: AC
  Administered 2020-02-04: 25 mg via INTRAVENOUS

## 2020-02-04 MED ORDER — DIPHENHYDRAMINE HCL 50 MG/ML IJ SOLN
INTRAMUSCULAR | Status: AC
  Filled 2020-02-04: qty 1

## 2020-02-04 MED ORDER — PACLITAXEL PROTEIN-BOUND CHEMO INJECTION 100 MG
100.0000 mg/m2 | Freq: Once | INTRAVENOUS | Status: AC
  Administered 2020-02-04: 200 mg via INTRAVENOUS
  Filled 2020-02-04: qty 40

## 2020-02-04 MED ORDER — PROCHLORPERAZINE EDISYLATE 10 MG/2ML IJ SOLN
10.0000 mg | Freq: Once | INTRAMUSCULAR | Status: AC
  Administered 2020-02-04: 10 mg via INTRAVENOUS

## 2020-02-04 MED ORDER — ACETAMINOPHEN 325 MG PO TABS
650.0000 mg | ORAL_TABLET | Freq: Once | ORAL | Status: AC
  Administered 2020-02-04: 650 mg via ORAL

## 2020-02-04 MED ORDER — TRASTUZUMAB-ANNS CHEMO 150 MG IV SOLR
4.0000 mg/kg | Freq: Once | INTRAVENOUS | Status: AC
  Administered 2020-02-04: 378 mg via INTRAVENOUS
  Filled 2020-02-04: qty 18

## 2020-02-04 NOTE — Patient Instructions (Addendum)
[facility] Discharge Instructions for Patients Receiving Chemotherapy  Today you received the following chemotherapy agents:  Kanjinti/Abraxane.  To help prevent nausea and vomiting after your treatment, we encourage you to take your nausea medication as directed.   If you develop nausea and vomiting that is not controlled by your nausea medication, call the clinic.   BELOW ARE SYMPTOMS THAT SHOULD BE REPORTED IMMEDIATELY:  *FEVER GREATER THAN 100.5 F  *CHILLS WITH OR WITHOUT FEVER  NAUSEA AND VOMITING THAT IS NOT CONTROLLED WITH YOUR NAUSEA MEDICATION  *UNUSUAL SHORTNESS OF BREATH  *UNUSUAL BRUISING OR BLEEDING  TENDERNESS IN MOUTH AND THROAT WITH OR WITHOUT PRESENCE OF ULCERS  *URINARY PROBLEMS  *BOWEL PROBLEMS  UNUSUAL RASH Items with * indicate a potential emergency and should be followed up as soon as possible.  Feel free to call the clinic should you have any questions or concerns. The clinic phone number is [phone].  Please show the [facility] at check-in to the Emergency Department and triage nurse.  Trastuzumab injection for infusion What is this medicine? TRASTUZUMAB (tras TOO zoo mab) is a monoclonal antibody. It is used to treat breast cancer and stomach cancer. This medicine may be used for other purposes; ask your health care provider or pharmacist if you have questions. COMMON BRAND NAME(S): Herceptin, Galvin Proffer, Trazimera What should I tell my health care provider before I take this medicine? They need to know if you have any of these conditions:  heart disease  heart failure  lung or breathing disease, like asthma  an unusual or allergic reaction to trastuzumab, benzyl alcohol, or other medications, foods, dyes, or preservatives  pregnant or trying to get pregnant  breast-feeding How should I use this medicine? This drug is given as an infusion into a vein. It is administered in a hospital or  clinic by a specially trained health care professional. Talk to your pediatrician regarding the use of this medicine in children. This medicine is not approved for use in children. Overdosage: If you think you have taken too much of this medicine contact a poison control center or emergency room at once. NOTE: This medicine is only for you. Do not share this medicine with others. What if I miss a dose? It is important not to miss a dose. Call your doctor or health care professional if you are unable to keep an appointment. What may interact with this medicine? This medicine may interact with the following medications:  certain types of chemotherapy, such as daunorubicin, doxorubicin, epirubicin, and idarubicin This list may not describe all possible interactions. Give your health care provider a list of all the medicines, herbs, non-prescription drugs, or dietary supplements you use. Also tell them if you smoke, drink alcohol, or use illegal drugs. Some items may interact with your medicine. What should I watch for while using this medicine? Visit your doctor for checks on your progress. Report any side effects. Continue your course of treatment even though you feel ill unless your doctor tells you to stop. Call your doctor or health care professional for advice if you get a fever, chills or sore throat, or other symptoms of a cold or flu. Do not treat yourself. Try to avoid being around people who are sick. You may experience fever, chills and shaking during your first infusion. These effects are usually mild and can be treated with other medicines. Report any side effects during the infusion to your health care professional. Fever and  chills usually do not happen with later infusions. Do not become pregnant while taking this medicine or for 7 months after stopping it. Women should inform their doctor if they wish to become pregnant or think they might be pregnant. Women of child-bearing potential  will need to have a negative pregnancy test before starting this medicine. There is a potential for serious side effects to an unborn child. Talk to your health care professional or pharmacist for more information. Do not breast-feed an infant while taking this medicine or for 7 months after stopping it. Women must use effective birth control with this medicine. What side effects may I notice from receiving this medicine? Side effects that you should report to your doctor or health care professional as soon as possible:  allergic reactions like skin rash, itching or hives, swelling of the face, lips, or tongue  chest pain or palpitations  cough  dizziness  feeling faint or lightheaded, falls  fever  general ill feeling or flu-like symptoms  signs of worsening heart failure like breathing problems; swelling in your legs and feet  unusually weak or tired Side effects that usually do not require medical attention (report to your doctor or health care professional if they continue or are bothersome):  bone pain  changes in taste  diarrhea  joint pain  nausea/vomiting  weight loss This list may not describe all possible side effects. Call your doctor for medical advice about side effects. You may report side effects to FDA at 1-800-FDA-1088. Where should I keep my medicine? This drug is given in a hospital or clinic and will not be stored at home. NOTE: This sheet is a summary. It may not cover all possible information. If you have questions about this medicine, talk to your doctor, pharmacist, or health care provider.  2020 Elsevier/Gold Standard (2016-05-18 14:37:52)  Nanoparticle Albumin-Bound Paclitaxel injection What is this medicine? NANOPARTICLE ALBUMIN-BOUND PACLITAXEL (Na no PAHR ti kuhl al BYOO muhn-bound PAK li TAX el) is a chemotherapy drug. It targets fast dividing cells, like cancer cells, and causes these cells to die. This medicine is used to treat advanced  breast cancer, lung cancer, and pancreatic cancer. This medicine may be used for other purposes; ask your health care provider or pharmacist if you have questions. COMMON BRAND NAME(S): Abraxane What should I tell my health care provider before I take this medicine? They need to know if you have any of these conditions:  kidney disease  liver disease  low blood counts, like low white cell, platelet, or red cell counts  lung or breathing disease, like asthma  tingling of the fingers or toes, or other nerve disorder  an unusual or allergic reaction to paclitaxel, albumin, other chemotherapy, other medicines, foods, dyes, or preservatives  pregnant or trying to get pregnant  breast-feeding How should I use this medicine? This drug is given as an infusion into a vein. It is administered in a hospital or clinic by a specially trained health care professional. Talk to your pediatrician regarding the use of this medicine in children. Special care may be needed. Overdosage: If you think you have taken too much of this medicine contact a poison control center or emergency room at once. NOTE: This medicine is only for you. Do not share this medicine with others. What if I miss a dose? It is important not to miss your dose. Call your doctor or health care professional if you are unable to keep an appointment. What may interact with  this medicine? This medicine may interact with the following medications:  antiviral medicines for hepatitis, HIV or AIDS  certain antibiotics like erythromycin and clarithromycin  certain medicines for fungal infections like ketoconazole and itraconazole  certain medicines for seizures like carbamazepine, phenobarbital, phenytoin  gemfibrozil  nefazodone  rifampin  St. John's wort This list may not describe all possible interactions. Give your health care provider a list of all the medicines, herbs, non-prescription drugs, or dietary supplements you  use. Also tell them if you smoke, drink alcohol, or use illegal drugs. Some items may interact with your medicine. What should I watch for while using this medicine? Your condition will be monitored carefully while you are receiving this medicine. You will need important blood work done while you are taking this medicine. This medicine can cause serious allergic reactions. If you experience allergic reactions like skin rash, itching or hives, swelling of the face, lips, or tongue, tell your doctor or health care professional right away. In some cases, you may be given additional medicines to help with side effects. Follow all directions for their use. This drug may make you feel generally unwell. This is not uncommon, as chemotherapy can affect healthy cells as well as cancer cells. Report any side effects. Continue your course of treatment even though you feel ill unless your doctor tells you to stop. Call your doctor or health care professional for advice if you get a fever, chills or sore throat, or other symptoms of a cold or flu. Do not treat yourself. This drug decreases your body's ability to fight infections. Try to avoid being around people who are sick. This medicine may increase your risk to bruise or bleed. Call your doctor or health care professional if you notice any unusual bleeding. Be careful brushing and flossing your teeth or using a toothpick because you may get an infection or bleed more easily. If you have any dental work done, tell your dentist you are receiving this medicine. Avoid taking products that contain aspirin, acetaminophen, ibuprofen, naproxen, or ketoprofen unless instructed by your doctor. These medicines may hide a fever. Do not become pregnant while taking this medicine or for 6 months after stopping it. Women should inform their doctor if they wish to become pregnant or think they might be pregnant. Men should not father a child while taking this medicine or for 3  months after stopping it. There is a potential for serious side effects to an unborn child. Talk to your health care professional or pharmacist for more information. Do not breast-feed an infant while taking this medicine or for 2 weeks after stopping it. This medicine may interfere with the ability to get pregnant or to father a child. You should talk to your doctor or health care professional if you are concerned about your fertility. What side effects may I notice from receiving this medicine? Side effects that you should report to your doctor or health care professional as soon as possible:  allergic reactions like skin rash, itching or hives, swelling of the face, lips, or tongue  breathing problems  changes in vision  fast, irregular heartbeat  low blood pressure  mouth sores  pain, tingling, numbness in the hands or feet  signs of decreased platelets or bleeding - bruising, pinpoint red spots on the skin, black, tarry stools, blood in the urine  signs of decreased red blood cells - unusually weak or tired, feeling faint or lightheaded, falls  signs of infection - fever or chills,  cough, sore throat, pain or difficulty passing urine  signs and symptoms of liver injury like dark yellow or brown urine; general ill feeling or flu-like symptoms; light-colored stools; loss of appetite; nausea; right upper belly pain; unusually weak or tired; yellowing of the eyes or skin  swelling of the ankles, feet, hands  unusually slow heartbeat Side effects that usually do not require medical attention (report to your doctor or health care professional if they continue or are bothersome):  diarrhea  hair loss  loss of appetite  nausea, vomiting  tiredness This list may not describe all possible side effects. Call your doctor for medical advice about side effects. You may report side effects to FDA at 1-800-FDA-1088. Where should I keep my medicine? This drug is given in a hospital or  clinic and will not be stored at home. NOTE: This sheet is a summary. It may not cover all possible information. If you have questions about this medicine, talk to your doctor, pharmacist, or health care provider.  2020 Elsevier/Gold Standard (2017-01-25 13:03:45)

## 2020-02-05 ENCOUNTER — Telehealth: Payer: Self-pay | Admitting: Oncology

## 2020-02-05 NOTE — Telephone Encounter (Signed)
No 8/30 los. No changes made to pt's schedule.  °

## 2020-02-08 ENCOUNTER — Telehealth: Payer: Self-pay | Admitting: Oncology

## 2020-02-08 NOTE — Telephone Encounter (Signed)
Rescheduled appointments per 9/3 scheduling message. Patient is aware of appointment changes.

## 2020-02-12 ENCOUNTER — Inpatient Hospital Stay: Payer: 59 | Admitting: Nurse Practitioner

## 2020-02-12 ENCOUNTER — Other Ambulatory Visit: Payer: Self-pay | Admitting: Adult Health

## 2020-02-12 ENCOUNTER — Inpatient Hospital Stay: Payer: 59

## 2020-02-12 ENCOUNTER — Inpatient Hospital Stay: Payer: 59 | Attending: Oncology

## 2020-02-12 ENCOUNTER — Other Ambulatory Visit: Payer: Self-pay

## 2020-02-12 ENCOUNTER — Encounter: Payer: Self-pay | Admitting: *Deleted

## 2020-02-12 ENCOUNTER — Encounter: Payer: Self-pay | Admitting: Nurse Practitioner

## 2020-02-12 VITALS — BP 139/83 | HR 80 | Temp 97.0°F | Resp 18 | Ht 62.45 in | Wt 215.2 lb

## 2020-02-12 DIAGNOSIS — Z171 Estrogen receptor negative status [ER-]: Secondary | ICD-10-CM | POA: Diagnosis not present

## 2020-02-12 DIAGNOSIS — Z170 Estrogen receptor positive status [ER+]: Secondary | ICD-10-CM

## 2020-02-12 DIAGNOSIS — D509 Iron deficiency anemia, unspecified: Secondary | ICD-10-CM | POA: Insufficient documentation

## 2020-02-12 DIAGNOSIS — Z7982 Long term (current) use of aspirin: Secondary | ICD-10-CM | POA: Insufficient documentation

## 2020-02-12 DIAGNOSIS — Z801 Family history of malignant neoplasm of trachea, bronchus and lung: Secondary | ICD-10-CM | POA: Insufficient documentation

## 2020-02-12 DIAGNOSIS — Z79899 Other long term (current) drug therapy: Secondary | ICD-10-CM | POA: Insufficient documentation

## 2020-02-12 DIAGNOSIS — C50411 Malignant neoplasm of upper-outer quadrant of right female breast: Secondary | ICD-10-CM | POA: Insufficient documentation

## 2020-02-12 DIAGNOSIS — Z5111 Encounter for antineoplastic chemotherapy: Secondary | ICD-10-CM | POA: Diagnosis not present

## 2020-02-12 DIAGNOSIS — Z923 Personal history of irradiation: Secondary | ICD-10-CM | POA: Insufficient documentation

## 2020-02-12 DIAGNOSIS — E785 Hyperlipidemia, unspecified: Secondary | ICD-10-CM | POA: Insufficient documentation

## 2020-02-12 DIAGNOSIS — E1165 Type 2 diabetes mellitus with hyperglycemia: Secondary | ICD-10-CM

## 2020-02-12 DIAGNOSIS — R5383 Other fatigue: Secondary | ICD-10-CM | POA: Insufficient documentation

## 2020-02-12 DIAGNOSIS — Z7984 Long term (current) use of oral hypoglycemic drugs: Secondary | ICD-10-CM | POA: Insufficient documentation

## 2020-02-12 DIAGNOSIS — Z803 Family history of malignant neoplasm of breast: Secondary | ICD-10-CM | POA: Diagnosis not present

## 2020-02-12 DIAGNOSIS — Z95828 Presence of other vascular implants and grafts: Secondary | ICD-10-CM

## 2020-02-12 DIAGNOSIS — I129 Hypertensive chronic kidney disease with stage 1 through stage 4 chronic kidney disease, or unspecified chronic kidney disease: Secondary | ICD-10-CM | POA: Diagnosis not present

## 2020-02-12 DIAGNOSIS — N189 Chronic kidney disease, unspecified: Secondary | ICD-10-CM | POA: Diagnosis not present

## 2020-02-12 DIAGNOSIS — E559 Vitamin D deficiency, unspecified: Secondary | ICD-10-CM

## 2020-02-12 DIAGNOSIS — Z5189 Encounter for other specified aftercare: Secondary | ICD-10-CM | POA: Insufficient documentation

## 2020-02-12 DIAGNOSIS — Z5112 Encounter for antineoplastic immunotherapy: Secondary | ICD-10-CM | POA: Insufficient documentation

## 2020-02-12 DIAGNOSIS — Z806 Family history of leukemia: Secondary | ICD-10-CM | POA: Diagnosis not present

## 2020-02-12 DIAGNOSIS — E119 Type 2 diabetes mellitus without complications: Secondary | ICD-10-CM | POA: Diagnosis not present

## 2020-02-12 DIAGNOSIS — I10 Essential (primary) hypertension: Secondary | ICD-10-CM

## 2020-02-12 DIAGNOSIS — Z9013 Acquired absence of bilateral breasts and nipples: Secondary | ICD-10-CM | POA: Insufficient documentation

## 2020-02-12 LAB — CBC WITH DIFFERENTIAL/PLATELET
Abs Immature Granulocytes: 0.03 10*3/uL (ref 0.00–0.07)
Basophils Absolute: 0 10*3/uL (ref 0.0–0.1)
Basophils Relative: 0 %
Eosinophils Absolute: 0.3 10*3/uL (ref 0.0–0.5)
Eosinophils Relative: 4 %
HCT: 33.5 % — ABNORMAL LOW (ref 36.0–46.0)
Hemoglobin: 11 g/dL — ABNORMAL LOW (ref 12.0–15.0)
Immature Granulocytes: 0 %
Lymphocytes Relative: 44 %
Lymphs Abs: 3.5 10*3/uL (ref 0.7–4.0)
MCH: 29.9 pg (ref 26.0–34.0)
MCHC: 32.8 g/dL (ref 30.0–36.0)
MCV: 91 fL (ref 80.0–100.0)
Monocytes Absolute: 0.5 10*3/uL (ref 0.1–1.0)
Monocytes Relative: 6 %
Neutro Abs: 3.7 10*3/uL (ref 1.7–7.7)
Neutrophils Relative %: 46 %
Platelets: 267 10*3/uL (ref 150–400)
RBC: 3.68 MIL/uL — ABNORMAL LOW (ref 3.87–5.11)
RDW: 13.3 % (ref 11.5–15.5)
WBC: 7.9 10*3/uL (ref 4.0–10.5)
nRBC: 0 % (ref 0.0–0.2)

## 2020-02-12 LAB — COMPREHENSIVE METABOLIC PANEL
ALT: 13 U/L (ref 0–44)
AST: 11 U/L — ABNORMAL LOW (ref 15–41)
Albumin: 3.7 g/dL (ref 3.5–5.0)
Alkaline Phosphatase: 69 U/L (ref 38–126)
Anion gap: 6 (ref 5–15)
BUN: 13 mg/dL (ref 8–23)
CO2: 27 mmol/L (ref 22–32)
Calcium: 9.2 mg/dL (ref 8.9–10.3)
Chloride: 106 mmol/L (ref 98–111)
Creatinine, Ser: 0.91 mg/dL (ref 0.44–1.00)
GFR calc Af Amer: >60 mL/min (ref >60)
GFR calc non Af Amer: >60 mL/min (ref >60)
Glucose, Bld: 136 mg/dL — ABNORMAL HIGH (ref 70–99)
Potassium: 4.1 mmol/L (ref 3.5–5.1)
Sodium: 139 mmol/L (ref 135–145)
Total Bilirubin: 0.4 mg/dL (ref 0.3–1.2)
Total Protein: 7 g/dL (ref 6.5–8.1)

## 2020-02-12 MED ORDER — DIPHENHYDRAMINE HCL 50 MG/ML IJ SOLN
INTRAMUSCULAR | Status: AC
  Filled 2020-02-12: qty 1

## 2020-02-12 MED ORDER — DIPHENHYDRAMINE HCL 50 MG/ML IJ SOLN
25.0000 mg | Freq: Once | INTRAMUSCULAR | Status: AC
  Administered 2020-02-12: 25 mg via INTRAVENOUS

## 2020-02-12 MED ORDER — PACLITAXEL PROTEIN-BOUND CHEMO INJECTION 100 MG
100.0000 mg/m2 | Freq: Once | INTRAVENOUS | Status: AC
  Administered 2020-02-12: 200 mg via INTRAVENOUS
  Filled 2020-02-12: qty 40

## 2020-02-12 MED ORDER — TRASTUZUMAB-ANNS CHEMO 150 MG IV SOLR
2.0000 mg/kg | Freq: Once | INTRAVENOUS | Status: AC
  Administered 2020-02-12: 189 mg via INTRAVENOUS
  Filled 2020-02-12: qty 9

## 2020-02-12 MED ORDER — PROCHLORPERAZINE EDISYLATE 10 MG/2ML IJ SOLN
INTRAMUSCULAR | Status: AC
  Filled 2020-02-12: qty 2

## 2020-02-12 MED ORDER — HEPARIN SOD (PORK) LOCK FLUSH 100 UNIT/ML IV SOLN
500.0000 [IU] | Freq: Once | INTRAVENOUS | Status: AC | PRN
  Administered 2020-02-12: 500 [IU]
  Filled 2020-02-12: qty 5

## 2020-02-12 MED ORDER — SODIUM CHLORIDE 0.9 % IV SOLN
Freq: Once | INTRAVENOUS | Status: AC
  Filled 2020-02-12: qty 250

## 2020-02-12 MED ORDER — PROCHLORPERAZINE EDISYLATE 10 MG/2ML IJ SOLN
10.0000 mg | Freq: Once | INTRAMUSCULAR | Status: AC
  Administered 2020-02-12: 10 mg via INTRAVENOUS

## 2020-02-12 MED ORDER — ACETAMINOPHEN 325 MG PO TABS
650.0000 mg | ORAL_TABLET | Freq: Once | ORAL | Status: AC
  Administered 2020-02-12: 650 mg via ORAL

## 2020-02-12 MED ORDER — SODIUM CHLORIDE 0.9% FLUSH
10.0000 mL | INTRAVENOUS | Status: DC | PRN
  Administered 2020-02-12: 10 mL
  Filled 2020-02-12: qty 10

## 2020-02-12 MED ORDER — ACETAMINOPHEN 325 MG PO TABS
ORAL_TABLET | ORAL | Status: AC
  Filled 2020-02-12: qty 2

## 2020-02-12 NOTE — Patient Instructions (Signed)
[facility] Discharge Instructions for Patients Receiving Chemotherapy  Today you received the following chemotherapy agents trastuzuma and abraxane  To help prevent nausea and vomiting after your treatment, we encourage you to take your nausea medication as directed   If you develop nausea and vomiting that is not controlled by your nausea medication, call the clinic.   BELOW ARE SYMPTOMS THAT SHOULD BE REPORTED IMMEDIATELY:  *FEVER GREATER THAN 100.5 F  *CHILLS WITH OR WITHOUT FEVER  NAUSEA AND VOMITING THAT IS NOT CONTROLLED WITH YOUR NAUSEA MEDICATION  *UNUSUAL SHORTNESS OF BREATH  *UNUSUAL BRUISING OR BLEEDING  TENDERNESS IN MOUTH AND THROAT WITH OR WITHOUT PRESENCE OF ULCERS  *URINARY PROBLEMS  *BOWEL PROBLEMS  UNUSUAL RASH Items with * indicate a potential emergency and should be followed up as soon as possible.  Feel free to call the clinic should you have any questions or concerns. The clinic phone number is [phone].  Please show the [facility] at check-in to the Emergency Department and triage nurse.

## 2020-02-12 NOTE — Progress Notes (Signed)
  [facility] OFFICE PROGRESS NOTE   Diagnosis: Triple positive breast cancer  INTERVAL HISTORY:   Ms. Spates returns as scheduled.  She completed cycle 1 Abraxane/trastuzumab 02/12/2020.  She denies nausea/vomiting.  No mouth sores.  No constipation or diarrhea.  No numbness or tingling in the hands or feet.  A few days ago she had a headache on the "top" of her head.  She took a nap and when she woke up the headache had resolved.  Some pain right hand.  She thinks this may be related to typing.  She has had this in the past.  Objective:  Vital signs in last 24 hours:  Blood pressure 139/83, pulse 80, temperature (!) 97 F (36.1 C), temperature source Tympanic, resp. rate 18, height 5' 2.45" (1.586 m), weight 215 lb 3.2 oz (97.6 kg), SpO2 97 %.    HEENT: No thrush or ulcers. Resp: Lungs clear bilaterally. Cardio: Regular rate and rhythm. GI: Abdomen soft and nontender.  No hepatomegaly. Vascular: No leg edema. Neuro: Alert and oriented. Port-A-Cath without erythema.  Lab Results:  Lab Results  Component Value Date   WBC 7.9 02/12/2020   HGB 11.0 (L) 02/12/2020   HCT 33.5 (L) 02/12/2020   MCV 91.0 02/12/2020   PLT 267 02/12/2020   NEUTROABS 3.7 02/12/2020    Imaging:  No results found.  Medications: I have reviewed the patient's current medications.  Assessment/Plan: Right breast cancerstatus post right breast upper outer quadrant biopsy 11/20/2019 for a clinical mT1b N0, stage IA invasive ductal carcinoma, grade 2 or 3, estrogen and progesterone receptor positive, HER-2 amplified, with an MIB-1 of 20%  (1) status post bilateral mastectomies 01/02/2020 showing             (a) on the right, an mpT2 pN0(i+), stage IB invasive ductal carcinoma, triple positive, with close but negative margins; 2 sentinel lymph nodes removed             (b) on the left, no malignancy identified             (c) the patient is not planning on reconstruction  (2) adjuvant  chemo immunotherapy will consist of Abraxane weekly x12 with trastuzumab, week 1 02/04/2020             (a) echo 12/13/2019 shows an ejection fraction in the 55-60% range  (3) trastuzumab to be continued to total 1 year  (4) adjuvant radiation  (5) antiestrogens to follow-up  Disposition: Ms. Newman appears stable.  She completed cycle 1 weekly Abraxane/trastuzumab 02/04/2020.  She seems to have tolerated well.  Plan to proceed with cycle 2 weekly Abraxane/trastuzumab today as scheduled.  We reviewed the CBC from today.  Counts adequate to proceed with treatment.  She has mild anemia.  She will return for lab, follow-up, cycle 3 weekly Abraxane/trastuzumab in 1 week.  She will contact the office in the interim with any problems.    Ned Card ANP/GNP-BC   02/12/2020  10:08 AM

## 2020-02-13 ENCOUNTER — Telehealth: Payer: Self-pay | Admitting: Nurse Practitioner

## 2020-02-13 ENCOUNTER — Encounter: Payer: Self-pay | Admitting: Oncology

## 2020-02-13 NOTE — Telephone Encounter (Signed)
No new appointments scheduled per 9/7 los

## 2020-02-13 NOTE — Progress Notes (Signed)
Called pt to introduce myself as her Arboriculturist and to discuss copay assistance.  She gave me consent to apply in her behalf so I enrolled her in the [place] program forKanjinti for $20,000 per calendar yearfrom9/8/21. Pt will pay $0 for herfirstdose or cycleand $82for each subsequent dose or cycle.  I also completed the application for [employer] for Abraxane.  I will get the Dr and pt's signature and fax to [place] for processing.  I will notify the pt of the outcome once received.  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.  I will give her my card on 02/18/20 for any questions or concerns she may have in the future.

## 2020-02-17 NOTE — Progress Notes (Signed)
[facility] OFFICE PROGRESS NOTE  [address], [address] [place] [address]  DIAGNOSIS: triple positive breast cancer (s/Garrett bilateral mastectomies)  CURRENT THERAPY: Adjuvant chemo   INTERVAL HISTORY: Tracy Garrett 64 y.o. female returns to the clinic today for a follow up visit accompanied by her husband. The patient is status post day 1 and 8 of herceptin and abraxane and she tolerated it fairly well. She denies fevers, chills, night sweats, or weight loss. She denies peripheral neuropathy but reports achyness in the back of her right hand that she describes as pain. This has been present prior to starting treatment but feels that it may be slightly worse.  She denies nausea, vomiting, or constipation. She had 1-2 loose stools last week that resolved without intervention. She denies any shortness of breath, cough, or pleurisy. She inquired about a localized  Mild achyness/headache on the top of her left scalp that does not last long. She also has a mild rash/ scattered pustuals on her chest. She did start using a new lotion and states she has sensitive skin. These are not pruritic or painful. She also reports under her tongue felt slightly sore yesterday. She is here for evaluation before starting day 15 cycle #1.   HISTORY OF CURRENT ILLNESS: From the original intake note:  Tracy Garrett had routine screening mammography on 05/01/2020 showing a possible abnormality in the right breast. She underwent right diagnostic mammography with tomography at [facility] on 05/09/2019 showing: breast density category A; probably-benign regional calcifications in the right breast at 11 o'clock. Short term follow up was recommended.   She returned for follow up at [facility] on 11/13/2019. She underwent right diagnostic mammography and right breast ultrasonography showing: breast composition A; indeterminate calcifications in upper-outer right breast spanning 5 cm;  0.8 cm irregular mass in right breast at 9 o'clock.  Accordingly on 11/20/2019 she proceeded to biopsy of the right breast areas in question. The pathology from this procedure ([id number]) showed:  1. Right breast, UOQ             - foci of invasive and in situ mammary carcinoma, grade 2-3, e-cadherin positive.  2. Right breast, 9 o'clock             - invasive mammary carcinoma, grade 2-3, e-cadherin positive             - Prognostic indicators significant for: estrogen receptor, 95% positive and progesterone receptor, 25% positive, both with strong staining intensity. Proliferation marker Ki67 at 20%. HER2 equivocal by immunohistochemistry (2+), but positive by fluorescent in situ hybridization with a signals ratio 3.30 and number per cell 7.25.  MEDICAL HISTORY: Past Medical History:  Diagnosis Date  . Allergy   . Anemia   . Arthritis   . Cancer [facility])    right breast cancer  . Cataracts, bilateral   . Chronic kidney disease    CKD  . Family history of breast cancer   . Family history of multiple myeloma   . Fatty liver disease, nonalcoholic 07/21/863   [place] appears normal on follow up US 09/2019  . GERD (gastroesophageal reflux disease)   . History of kidney stones    x2 episodes  . Hyperlipidemia   . Hypertension   . PONV (postoperative nausea and vomiting)   . Sleep apnea    test was dx, as mild- no further tx. required  . Type II or unspecified type diabetes mellitus without mention of complication,  not stated as uncontrolled     ALLERGIES:  is allergic to hydromorphone and other.  MEDICATIONS:  Current Outpatient Medications  Medication Sig Dispense Refill  . ALPRAZolam (XANAX) 1 MG tablet Take 1 tablet 1 hour prior to procedure.  Then take 1 tablet right before procedure if needed (Patient not taking: Reported on 12/24/2019) 2 tablet 0  . aspirin EC 81 MG tablet Take 81 mg by mouth daily. Swallow whole.    . cetirizine (ZYRTEC) 10 MG tablet Take 10 mg by mouth daily.     . Cholecalciferol (VITAMIN D3) 5000 UNITS TABS Take 5,000 Units by mouth daily.     Marland Kitchen glucose blood (CONTOUR NEXT TEST) test strip Check blood sugar 1 time daily-[id number] 100 each 12  . ibuprofen (ADVIL) 800 MG tablet Take 1 tablet (800 mg total) by mouth every 8 (eight) hours as needed. 30 tablet 0  . lidocaine-prilocaine (EMLA) cream Apply to affected area once 30 g 3  . losartan (COZAAR) 50 MG tablet TAKE 1/2-1 TAB DAILY FOR BLOOD PRESSURE AND TO PROTECT KIDNEYS. 30 tablet 5  . metFORMIN (GLUCOPHAGE XR) 500 MG 24 hr tablet Take 1 tablet (500 mg total) by mouth daily with breakfast. 30 tablet 11  . Omega-3 Fatty Acids (OMEGA 3 PO) Take 1 capsule by mouth 2 (two) times daily. (Patient not taking: Reported on 12/24/2019)    . OVER THE COUNTER MEDICATION Takes CoQ10 1 capsule daily (Patient not taking: Reported on 12/24/2019)    . oxyCODONE (OXY IR/ROXICODONE) 5 MG immediate release tablet Take 1 tablet (5 mg total) by mouth every 6 (six) hours as needed for severe pain. 15 tablet 0  . pantoprazole (PROTONIX) 40 MG tablet Take 40 mg by mouth daily.     . prochlorperazine (COMPAZINE) 10 MG tablet Take 1 tablet (10 mg total) by mouth every 6 (six) hours as needed (Nausea or vomiting). 30 tablet 1  . rosuvastatin (CRESTOR) 5 MG tablet Take 1 tablet daily for Cholesterol (Patient taking differently: Take 5 mg by mouth daily. ) 90 tablet 1  . sulfamethoxazole-trimethoprim (BACTRIM DS) 800-160 MG tablet Take 1 tablet by mouth 2 (two) times daily. (Patient not taking: Reported on 12/24/2019) 10 tablet 0   No current facility-administered medications for this visit.    SURGICAL HISTORY:  Past Surgical History:  Procedure Laterality Date  . BREAST SURGERY  1999   Reduction Mammoplasties  . CHOLECYSTECTOMY  2003  . colonoscopsy  11/2007   neg- polyp removal with both procedures x2  . CYSTOSCOPY     surgery to extract stone x 1 with stent  . EUS N/A 07/26/2014   Procedure: UPPER ENDOSCOPIC ULTRASOUND  (EUS) LINEAR;  Surgeon: Beryle Beams, MD;  Location: [facility];  Service: Endoscopy;  Laterality: N/A;  . 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 LYSIS OF ADHESION  10/10/2014   Procedure: LYSIS OF ADHESION;  Surgeon: TJackolyn Confer MD;  Location: [facility];  Service: General;;  . MASTECTOMY W/ SENTINEL NODE BIOPSY Bilateral 01/02/2020   Procedure: BILATERAL MASTECTOMY WITH RIGHT SENTINEL LYMPH NODE MAPPING;  Surgeon: CErroll Luna MD;  [place] [facility]  Service: General;  Laterality: Bilateral;  PEC BLOCK  . PORTACATH PLACEMENT Right 01/02/2020   Procedure: INSERTION PORT-A-CATH WITH ULTRASOUND GUIDANCE;  Surgeon: CErroll Luna MD;  [place] [facility]  Service: General;  Laterality: Right;  .  TONSILLECTOMY     and adenoids removed age 28  . VAGINAL HYSTERECTOMY  2002    FAMILY HISTORY:      Family History  Problem Relation Age of Onset  . Breast cancer Mother 24  . Heart disease Father        smoker  . Diabetes type I Half-Sister   . Alzheimer's disease Maternal Grandmother   . Emphysema Paternal Grandfather        smoker  . Heart attack Paternal Grandfather   . Lung cancer Paternal Grandfather   . CAD Brother 33       stents  . Breast cancer Maternal Aunt        dx. in her late 67s  . Breast cancer Cousin 51  . Cancer Maternal Uncle        dx. in his 22s; tumor on the back of neck  . Breast cancer Paternal Aunt        dx. >50  . Multiple myeloma Maternal Aunt 92  . Multiple myeloma Paternal Aunt        dx. >50  . Breast cancer Cousin 22   Her father died at age 2. Her mother died at age 31. She has 2 brothers and had 1 sister who is deceased. She reports breast cancer on her maternal side-- her mother at age 41, an aunt, and a cousin.   GYNECOLOGIC HISTORY:  No LMP recorded. Patient has had a  hysterectomy. Menarche: 64 years old Age at first live birth: 64 years old Tracy Garrett 2 LMP unsure Contraceptive: never used HRT never used Hysterectomy? Yes, 2002 BSO? no   SOCIAL HISTORY: (updated 11/2019)  Tracy Garrett is currently working for [employer]. Husband Tracy Garrett is       . Son Tracy Garrett, age 64, is self-employed in [place], [place]. Son Tracy Garrett, age 83, lives here in [place].                ADVANCED DIRECTIVES: In the absence of any documentation to the contrary, the patient's spouse is their HCPOA.   REVIEW OF SYSTEMS:   Review of Systems  Constitutional: Negative for appetite change, chills, fatigue, fever and unexpected weight change.  HENT: Positive for mild soreness under the tongue without ulceration. Positive for nasal congestion from allergies. Negative for nosebleeds, sore throat and trouble swallowing.   Eyes: Negative for eye problems and icterus.  Respiratory: Negative for cough, hemoptysis, shortness of breath and wheezing.   Cardiovascular: Negative for chest pain and leg swelling.  Gastrointestinal: Negative for abdominal pain, constipation, diarrhea (diarrhea), nausea and vomiting.  Genitourinary: Negative for bladder incontinence, difficulty urinating, dysuria, frequency and hematuria.   Musculoskeletal: Positive for mild intermittent achy pain on back of right hand. Negative for back pain, gait problem, neck pain and neck stiffness.  Skin: Positive for a few scattered skin lesions on chest. Negative for itching. Neurological: Positive for mild headache. Negative for dizziness, extremity weakness, gait problem, headaches, light-headedness and seizures.  Hematological: Negative for adenopathy. Does not bruise/bleed easily.  Psychiatric/Behavioral: Negative for confusion, depression and sleep disturbance. The patient is not nervous/anxious.     PHYSICAL EXAMINATION:  Blood pressure (!) 157/91, pulse 85, temperature (!) 97.2 F (36.2 C), temperature  source Tympanic, resp. rate 20, height 5' 2.45" (1.586 m), weight 216 lb 14.4 oz (98.4 kg), SpO2 100 %.  ECOG PERFORMANCE STATUS: 1 - Symptomatic but completely ambulatory  Physical Exam  Constitutional: Oriented to person, place, and time and well-developed,  well-nourished, and in no distress.  HENT:  Head: Normocephalic and atraumatic.  Mouth/Throat: Oropharynx is clear and moist. No oropharyngeal exudate.  Eyes: Conjunctivae are normal. Right eye exhibits no discharge. Left eye exhibits no discharge. No scleral icterus.  Neck: Normal range of motion. Neck supple.  Cardiovascular: Normal rate, regular rhythm, normal heart sounds and intact distal pulses.   Pulmonary/Chest: Effort normal and breath sounds normal. No respiratory distress. No wheezes. No rales.  Abdominal: Soft. Bowel sounds are normal. Exhibits no distension and no mass. There is no tenderness.  Musculoskeletal: Normal range of motion. Exhibits no edema.  Lymphadenopathy:    No cervical adenopathy.  Neurological: Alert and oriented to person, place, and time. Exhibits normal muscle tone. Gait normal. Coordination normal.  Skin: Skin is warm and dry. A few scattered pustules on chest. Not diaphoretic. No erythema. No pallor.  Psychiatric: Mood, memory and judgment normal.  Vitals reviewed.  LABORATORY DATA: Lab Results  Component Value Date   WBC 5.7 02/18/2020   HGB 10.7 (L) 02/18/2020   HCT 33.2 (L) 02/18/2020   MCV 90.7 02/18/2020   PLT 276 02/18/2020      Chemistry      Component Value Date/Time   NA 139 02/12/2020 0906   K 4.1 02/12/2020 0906   CL 106 02/12/2020 0906   CO2 27 02/12/2020 0906   BUN 13 02/12/2020 0906   CREATININE 0.91 02/12/2020 0906   CREATININE 0.94 11/28/2019 1217   CREATININE 0.82 11/26/2019 1533      Component Value Date/Time   CALCIUM 9.2 02/12/2020 0906   ALKPHOS 69 02/12/2020 0906   AST 11 (L) 02/12/2020 0906   AST 10 (L) 11/28/2019 1217   ALT 13 02/12/2020 0906   ALT 16  11/28/2019 1217   BILITOT 0.4 02/12/2020 0906   BILITOT 0.6 11/28/2019 1217       RADIOGRAPHIC STUDIES:  No results found.   ASSESSMENT/PLAN:  ELIGIBLE FOR AVAILABLE RESEARCH PROTOCOL: AET  ASSESSMENT: 64 y.o. Tracy Garrett status post right breast upper outer quadrant biopsy 11/20/2019 for a clinical mT1b N0, stage IA invasive ductal carcinoma, grade 2 or 3, estrogen and progesterone receptor positive, HER-2 amplified, with an MIB-1 of 20%  (1) status post bilateral mastectomies 01/02/2020 showing             (a) on the right, an mpT2 pN0(i+), stage IB invasive ductal carcinoma, triple positive, with close but negative margins; 2 sentinel lymph nodes removed             (b) on the left, no malignancy identified             (c) the patient is not planning on reconstruction  (2) adjuvant chemo immunotherapy will consist of Abraxane weekly x12 with trastuzumab             (a) echo 12/13/2019 shows an ejection fraction in the 55-60% range  (3) trastuzumab to be continued to total 1 year  (4) adjuvant radiation  (5) genetics testing 12/16/2019 through the [facility] Breast Cancer STAT panel + Common Hereditary Cancers panel found no deleterious mutations in ATM, BRCA1, BRCA2, CDH1, CHEK2, PALB2, PTEN, STK11 and TP53. The Common Hereditary Cancers Panel offered by [facility] includes sequencing and/or deletion duplication testing of the following 48 genes: APC, ATM, AXIN2, BARD1, BMPR1A, BRCA1, BRCA2, BRIP1, CDH1, CDK4, CDKN2A (p14ARF), CDKN2A (p16INK4a), CHEK2, CTNNA1, DICER1, EPCAM (Deletion/duplication testing only), GREM1 (promoter region deletion/duplication testing only), KIT, MEN1, MLH1, MSH2, MSH3, MSH6, MUTYH, NBN, NF1, NTHL1, PALB2,  PDGFRA, PMS2, POLD1, POLE, PTEN, RAD50, RAD51C, RAD51D, RNF43, SDHB, SDHC, SDHD, SMAD4, SMARCA4. STK11, TP53, TSC1, TSC2, and VHL.  The following genes were evaluated for sequence changes only: SDHA and HOXB13 c.251G>A variant only.             (a)  Two variants of uncertain significance (VUS) were detected - one in the BRIP1 gene called c.1735C>T and a second in the POLD1 gene called c.2861C>G.   (6) antiestrogens to follow-up   Plan:  Tracy Garrett is a very pleasant 64 year old female diagnosed with triple negative breast cancer. She tolerated her first two infusions with abraxane and herceptin well without any concerning adverse side effects. Labs were reviewed with the patient which showed mild anemia. Recommend that she proceed with abraxane/trastuzumab today as scheduled.   She will continue with her weekly treatments for now. She is scheduled for a return office visit with Dr. Jana Hakim in 2 weeks. I will keep this as scheduled for now but will follow up with him to see if she needs a provider visit next week. She knows she can call us sooner if she has any concerns in the interval.   Regarding her skin lesions. She has a few scattered pustules on her chest. She recently started using new lotion. I advised her to stop using the lotion for now to see if it resolves her skin lesions. Her lesions are not bothering her and are not itchy. However, if she develops new or worsening rash/symptoms she was advised to call us.    The patient was advised to call immediately if she has any concerning symptoms in the interval. The patient voices understanding of current disease status and treatment options and is in agreement with the current care plan. All questions were answered. The patient knows to call the clinic with any problems, questions or concerns. We can certainly see the patient much sooner if necessary  No orders of the defined types were placed in this encounter.    Tracy Jasinski L Elishua Radford, PA-C 02/18/20

## 2020-02-18 ENCOUNTER — Other Ambulatory Visit: Payer: Self-pay

## 2020-02-18 ENCOUNTER — Ambulatory Visit: Payer: 59 | Admitting: Adult Health

## 2020-02-18 ENCOUNTER — Inpatient Hospital Stay: Payer: 59

## 2020-02-18 ENCOUNTER — Other Ambulatory Visit: Payer: 59

## 2020-02-18 ENCOUNTER — Inpatient Hospital Stay: Payer: 59 | Admitting: Physician Assistant

## 2020-02-18 VITALS — BP 157/91 | HR 85 | Temp 97.2°F | Resp 20 | Ht 62.45 in | Wt 216.9 lb

## 2020-02-18 DIAGNOSIS — E559 Vitamin D deficiency, unspecified: Secondary | ICD-10-CM

## 2020-02-18 DIAGNOSIS — Z170 Estrogen receptor positive status [ER+]: Secondary | ICD-10-CM

## 2020-02-18 DIAGNOSIS — Z5111 Encounter for antineoplastic chemotherapy: Secondary | ICD-10-CM | POA: Insufficient documentation

## 2020-02-18 DIAGNOSIS — C50411 Malignant neoplasm of upper-outer quadrant of right female breast: Secondary | ICD-10-CM

## 2020-02-18 DIAGNOSIS — E1165 Type 2 diabetes mellitus with hyperglycemia: Secondary | ICD-10-CM

## 2020-02-18 LAB — CBC WITH DIFFERENTIAL/PLATELET
Abs Immature Granulocytes: 0.02 10*3/uL (ref 0.00–0.07)
Basophils Absolute: 0 10*3/uL (ref 0.0–0.1)
Basophils Relative: 0 %
Eosinophils Absolute: 0.1 10*3/uL (ref 0.0–0.5)
Eosinophils Relative: 2 %
HCT: 33.2 % — ABNORMAL LOW (ref 36.0–46.0)
Hemoglobin: 10.7 g/dL — ABNORMAL LOW (ref 12.0–15.0)
Immature Granulocytes: 0 %
Lymphocytes Relative: 47 %
Lymphs Abs: 2.7 10*3/uL (ref 0.7–4.0)
MCH: 29.2 pg (ref 26.0–34.0)
MCHC: 32.2 g/dL (ref 30.0–36.0)
MCV: 90.7 fL (ref 80.0–100.0)
Monocytes Absolute: 0.3 10*3/uL (ref 0.1–1.0)
Monocytes Relative: 5 %
Neutro Abs: 2.7 10*3/uL (ref 1.7–7.7)
Neutrophils Relative %: 46 %
Platelets: 276 10*3/uL (ref 150–400)
RBC: 3.66 MIL/uL — ABNORMAL LOW (ref 3.87–5.11)
RDW: 13.4 % (ref 11.5–15.5)
WBC: 5.7 10*3/uL (ref 4.0–10.5)
nRBC: 0 % (ref 0.0–0.2)

## 2020-02-18 LAB — COMPREHENSIVE METABOLIC PANEL
ALT: 13 U/L (ref 0–44)
AST: 12 U/L — ABNORMAL LOW (ref 15–41)
Albumin: 3.9 g/dL (ref 3.5–5.0)
Alkaline Phosphatase: 78 U/L (ref 38–126)
Anion gap: 9 (ref 5–15)
BUN: 11 mg/dL (ref 8–23)
CO2: 26 mmol/L (ref 22–32)
Calcium: 9.3 mg/dL (ref 8.9–10.3)
Chloride: 105 mmol/L (ref 98–111)
Creatinine, Ser: 0.93 mg/dL (ref 0.44–1.00)
GFR calc Af Amer: >60 mL/min (ref >60)
GFR calc non Af Amer: >60 mL/min (ref >60)
Glucose, Bld: 136 mg/dL — ABNORMAL HIGH (ref 70–99)
Potassium: 4.1 mmol/L (ref 3.5–5.1)
Sodium: 140 mmol/L (ref 135–145)
Total Bilirubin: 0.5 mg/dL (ref 0.3–1.2)
Total Protein: 7.2 g/dL (ref 6.5–8.1)

## 2020-02-18 MED ORDER — DIPHENHYDRAMINE HCL 50 MG/ML IJ SOLN
25.0000 mg | Freq: Once | INTRAMUSCULAR | Status: AC
  Administered 2020-02-18: 25 mg via INTRAVENOUS

## 2020-02-18 MED ORDER — PROCHLORPERAZINE EDISYLATE 10 MG/2ML IJ SOLN
INTRAMUSCULAR | Status: AC
  Filled 2020-02-18: qty 2

## 2020-02-18 MED ORDER — DIPHENHYDRAMINE HCL 50 MG/ML IJ SOLN
INTRAMUSCULAR | Status: AC
  Filled 2020-02-18: qty 1

## 2020-02-18 MED ORDER — PACLITAXEL PROTEIN-BOUND CHEMO INJECTION 100 MG
100.0000 mg/m2 | Freq: Once | INTRAVENOUS | Status: AC
  Administered 2020-02-18: 200 mg via INTRAVENOUS
  Filled 2020-02-18: qty 40

## 2020-02-18 MED ORDER — TRASTUZUMAB-ANNS CHEMO 150 MG IV SOLR
2.0000 mg/kg | Freq: Once | INTRAVENOUS | Status: AC
  Administered 2020-02-18: 189 mg via INTRAVENOUS
  Filled 2020-02-18: qty 9

## 2020-02-18 MED ORDER — PROCHLORPERAZINE EDISYLATE 10 MG/2ML IJ SOLN
10.0000 mg | Freq: Once | INTRAMUSCULAR | Status: AC
  Administered 2020-02-18: 10 mg via INTRAVENOUS

## 2020-02-18 MED ORDER — ACETAMINOPHEN 325 MG PO TABS
ORAL_TABLET | ORAL | Status: AC
  Filled 2020-02-18: qty 2

## 2020-02-18 MED ORDER — SODIUM CHLORIDE 0.9 % IV SOLN
Freq: Once | INTRAVENOUS | Status: AC
  Filled 2020-02-18: qty 250

## 2020-02-18 MED ORDER — SODIUM CHLORIDE 0.9% FLUSH
10.0000 mL | INTRAVENOUS | Status: DC | PRN
  Administered 2020-02-18: 10 mL
  Filled 2020-02-18: qty 10

## 2020-02-18 MED ORDER — ACETAMINOPHEN 325 MG PO TABS
650.0000 mg | ORAL_TABLET | Freq: Once | ORAL | Status: AC
  Administered 2020-02-18: 650 mg via ORAL

## 2020-02-18 MED ORDER — HEPARIN SOD (PORK) LOCK FLUSH 100 UNIT/ML IV SOLN
500.0000 [IU] | Freq: Once | INTRAVENOUS | Status: AC | PRN
  Administered 2020-02-18: 500 [IU]
  Filled 2020-02-18: qty 5

## 2020-02-18 NOTE — Patient Instructions (Signed)

## 2020-02-18 NOTE — Patient Instructions (Signed)
[facility] Discharge Instructions for Patients Receiving Chemotherapy  Today you received the following chemotherapy agents trastuzuma and abraxane  To help prevent nausea and vomiting after your treatment, we encourage you to take your nausea medication as directed   If you develop nausea and vomiting that is not controlled by your nausea medication, call the clinic.   BELOW ARE SYMPTOMS THAT SHOULD BE REPORTED IMMEDIATELY:  *FEVER GREATER THAN 100.5 F  *CHILLS WITH OR WITHOUT FEVER  NAUSEA AND VOMITING THAT IS NOT CONTROLLED WITH YOUR NAUSEA MEDICATION  *UNUSUAL SHORTNESS OF BREATH  *UNUSUAL BRUISING OR BLEEDING  TENDERNESS IN MOUTH AND THROAT WITH OR WITHOUT PRESENCE OF ULCERS  *URINARY PROBLEMS  *BOWEL PROBLEMS  UNUSUAL RASH Items with * indicate a potential emergency and should be followed up as soon as possible.  Feel free to call the clinic should you have any questions or concerns. The clinic phone number is [phone].  Please show the [facility] at check-in to the Emergency Department and triage nurse.

## 2020-02-20 ENCOUNTER — Encounter: Payer: Self-pay | Admitting: Oncology

## 2020-02-20 NOTE — Progress Notes (Signed)
Pt was approved w/ [place] for Abraxane for the duration of the 2021 year w/ a maximum benefit of $10,000 per enrollment year.

## 2020-02-25 ENCOUNTER — Inpatient Hospital Stay: Payer: 59

## 2020-02-25 ENCOUNTER — Other Ambulatory Visit: Payer: Self-pay

## 2020-02-25 VITALS — BP 151/80 | HR 92 | Temp 98.1°F | Resp 18 | Wt 217.0 lb

## 2020-02-25 DIAGNOSIS — E559 Vitamin D deficiency, unspecified: Secondary | ICD-10-CM

## 2020-02-25 DIAGNOSIS — Z170 Estrogen receptor positive status [ER+]: Secondary | ICD-10-CM

## 2020-02-25 DIAGNOSIS — Z95828 Presence of other vascular implants and grafts: Secondary | ICD-10-CM

## 2020-02-25 DIAGNOSIS — C50411 Malignant neoplasm of upper-outer quadrant of right female breast: Secondary | ICD-10-CM | POA: Diagnosis not present

## 2020-02-25 DIAGNOSIS — E1165 Type 2 diabetes mellitus with hyperglycemia: Secondary | ICD-10-CM

## 2020-02-25 LAB — CBC WITH DIFFERENTIAL/PLATELET
Abs Immature Granulocytes: 0.11 10*3/uL — ABNORMAL HIGH (ref 0.00–0.07)
Basophils Absolute: 0.1 10*3/uL (ref 0.0–0.1)
Basophils Relative: 1 %
Eosinophils Absolute: 0.1 10*3/uL (ref 0.0–0.5)
Eosinophils Relative: 2 %
HCT: 32.2 % — ABNORMAL LOW (ref 36.0–46.0)
Hemoglobin: 10.5 g/dL — ABNORMAL LOW (ref 12.0–15.0)
Immature Granulocytes: 2 %
Lymphocytes Relative: 44 %
Lymphs Abs: 2.9 10*3/uL (ref 0.7–4.0)
MCH: 30 pg (ref 26.0–34.0)
MCHC: 32.6 g/dL (ref 30.0–36.0)
MCV: 92 fL (ref 80.0–100.0)
Monocytes Absolute: 0.5 10*3/uL (ref 0.1–1.0)
Monocytes Relative: 8 %
Neutro Abs: 2.8 10*3/uL (ref 1.7–7.7)
Neutrophils Relative %: 43 %
Platelets: 366 10*3/uL (ref 150–400)
RBC: 3.5 MIL/uL — ABNORMAL LOW (ref 3.87–5.11)
RDW: 14.1 % (ref 11.5–15.5)
WBC: 6.4 10*3/uL (ref 4.0–10.5)
nRBC: 0.3 % — ABNORMAL HIGH (ref 0.0–0.2)

## 2020-02-25 LAB — COMPREHENSIVE METABOLIC PANEL
ALT: 14 U/L (ref 0–44)
AST: 10 U/L — ABNORMAL LOW (ref 15–41)
Albumin: 3.8 g/dL (ref 3.5–5.0)
Alkaline Phosphatase: 75 U/L (ref 38–126)
Anion gap: 9 (ref 5–15)
BUN: 12 mg/dL (ref 8–23)
CO2: 25 mmol/L (ref 22–32)
Calcium: 8.9 mg/dL (ref 8.9–10.3)
Chloride: 102 mmol/L (ref 98–111)
Creatinine, Ser: 0.95 mg/dL (ref 0.44–1.00)
GFR calc Af Amer: >60 mL/min (ref >60)
GFR calc non Af Amer: >60 mL/min (ref >60)
Glucose, Bld: 185 mg/dL — ABNORMAL HIGH (ref 70–99)
Potassium: 4.1 mmol/L (ref 3.5–5.1)
Sodium: 136 mmol/L (ref 135–145)
Total Bilirubin: 0.5 mg/dL (ref 0.3–1.2)
Total Protein: 7.1 g/dL (ref 6.5–8.1)

## 2020-02-25 MED ORDER — PACLITAXEL PROTEIN-BOUND CHEMO INJECTION 100 MG
100.0000 mg/m2 | Freq: Once | INTRAVENOUS | Status: AC
  Administered 2020-02-25: 200 mg via INTRAVENOUS
  Filled 2020-02-25: qty 40

## 2020-02-25 MED ORDER — DIPHENHYDRAMINE HCL 50 MG/ML IJ SOLN
INTRAMUSCULAR | Status: AC
  Filled 2020-02-25: qty 1

## 2020-02-25 MED ORDER — SODIUM CHLORIDE 0.9 % IV SOLN
Freq: Once | INTRAVENOUS | Status: AC
  Filled 2020-02-25: qty 250

## 2020-02-25 MED ORDER — PROCHLORPERAZINE EDISYLATE 10 MG/2ML IJ SOLN
INTRAMUSCULAR | Status: AC
  Filled 2020-02-25: qty 2

## 2020-02-25 MED ORDER — TRASTUZUMAB-ANNS CHEMO 150 MG IV SOLR
2.0000 mg/kg | Freq: Once | INTRAVENOUS | Status: AC
  Administered 2020-02-25: 189 mg via INTRAVENOUS
  Filled 2020-02-25: qty 9

## 2020-02-25 MED ORDER — PROCHLORPERAZINE EDISYLATE 10 MG/2ML IJ SOLN
10.0000 mg | Freq: Once | INTRAMUSCULAR | Status: AC
  Administered 2020-02-25: 10 mg via INTRAVENOUS

## 2020-02-25 MED ORDER — ACETAMINOPHEN 325 MG PO TABS
650.0000 mg | ORAL_TABLET | Freq: Once | ORAL | Status: AC
  Administered 2020-02-25: 650 mg via ORAL

## 2020-02-25 MED ORDER — DIPHENHYDRAMINE HCL 50 MG/ML IJ SOLN
25.0000 mg | Freq: Once | INTRAMUSCULAR | Status: AC
  Administered 2020-02-25: 25 mg via INTRAVENOUS

## 2020-02-25 MED ORDER — SODIUM CHLORIDE 0.9% FLUSH
10.0000 mL | INTRAVENOUS | Status: DC | PRN
  Administered 2020-02-25: 10 mL
  Filled 2020-02-25: qty 10

## 2020-02-25 MED ORDER — ACETAMINOPHEN 325 MG PO TABS
ORAL_TABLET | ORAL | Status: AC
  Filled 2020-02-25: qty 2

## 2020-02-25 MED ORDER — PROCHLORPERAZINE MALEATE 10 MG PO TABS
ORAL_TABLET | ORAL | Status: AC
  Filled 2020-02-25: qty 1

## 2020-02-25 MED ORDER — HEPARIN SOD (PORK) LOCK FLUSH 100 UNIT/ML IV SOLN
500.0000 [IU] | Freq: Once | INTRAVENOUS | Status: AC | PRN
  Administered 2020-02-25: 500 [IU]
  Filled 2020-02-25: qty 5

## 2020-02-25 NOTE — Patient Instructions (Signed)
[facility] Discharge Instructions for Patients Receiving Chemotherapy  Today you received the following chemotherapy agents trastuzumab and abraxane  To help prevent nausea and vomiting after your treatment, we encourage you to take your nausea medication as directed   If you develop nausea and vomiting that is not controlled by your nausea medication, call the clinic.   BELOW ARE SYMPTOMS THAT SHOULD BE REPORTED IMMEDIATELY:  *FEVER GREATER THAN 100.5 F  *CHILLS WITH OR WITHOUT FEVER  NAUSEA AND VOMITING THAT IS NOT CONTROLLED WITH YOUR NAUSEA MEDICATION  *UNUSUAL SHORTNESS OF BREATH  *UNUSUAL BRUISING OR BLEEDING  TENDERNESS IN MOUTH AND THROAT WITH OR WITHOUT PRESENCE OF ULCERS  *URINARY PROBLEMS  *BOWEL PROBLEMS  UNUSUAL RASH Items with * indicate a potential emergency and should be followed up as soon as possible.  Feel free to call the clinic should you have any questions or concerns. The clinic phone number is [phone].  Please show the [facility] at check-in to the Emergency Department and triage nurse.

## 2020-02-25 NOTE — Patient Instructions (Signed)

## 2020-03-03 ENCOUNTER — Inpatient Hospital Stay: Payer: 59

## 2020-03-03 ENCOUNTER — Other Ambulatory Visit: Payer: Self-pay | Admitting: Oncology

## 2020-03-03 ENCOUNTER — Inpatient Hospital Stay (HOSPITAL_BASED_OUTPATIENT_CLINIC_OR_DEPARTMENT_OTHER): Payer: 59 | Admitting: Oncology

## 2020-03-03 ENCOUNTER — Other Ambulatory Visit: Payer: Self-pay

## 2020-03-03 ENCOUNTER — Encounter: Payer: Self-pay | Admitting: *Deleted

## 2020-03-03 ENCOUNTER — Inpatient Hospital Stay: Payer: 59 | Admitting: Oncology

## 2020-03-03 VITALS — BP 156/81 | HR 79 | Temp 98.6°F | Resp 18 | Ht 62.0 in | Wt 217.2 lb

## 2020-03-03 DIAGNOSIS — E559 Vitamin D deficiency, unspecified: Secondary | ICD-10-CM

## 2020-03-03 DIAGNOSIS — C50411 Malignant neoplasm of upper-outer quadrant of right female breast: Secondary | ICD-10-CM | POA: Diagnosis not present

## 2020-03-03 DIAGNOSIS — Z170 Estrogen receptor positive status [ER+]: Secondary | ICD-10-CM

## 2020-03-03 DIAGNOSIS — Z95828 Presence of other vascular implants and grafts: Secondary | ICD-10-CM

## 2020-03-03 DIAGNOSIS — E1165 Type 2 diabetes mellitus with hyperglycemia: Secondary | ICD-10-CM

## 2020-03-03 LAB — CBC WITH DIFFERENTIAL/PLATELET
Abs Immature Granulocytes: 0.08 10*3/uL — ABNORMAL HIGH (ref 0.00–0.07)
Basophils Absolute: 0 10*3/uL (ref 0.0–0.1)
Basophils Relative: 1 %
Eosinophils Absolute: 0.1 10*3/uL (ref 0.0–0.5)
Eosinophils Relative: 2 %
HCT: 32.6 % — ABNORMAL LOW (ref 36.0–46.0)
Hemoglobin: 10.7 g/dL — ABNORMAL LOW (ref 12.0–15.0)
Immature Granulocytes: 1 %
Lymphocytes Relative: 43 %
Lymphs Abs: 2.7 10*3/uL (ref 0.7–4.0)
MCH: 29.9 pg (ref 26.0–34.0)
MCHC: 32.8 g/dL (ref 30.0–36.0)
MCV: 91.1 fL (ref 80.0–100.0)
Monocytes Absolute: 0.6 10*3/uL (ref 0.1–1.0)
Monocytes Relative: 9 %
Neutro Abs: 2.9 10*3/uL (ref 1.7–7.7)
Neutrophils Relative %: 44 %
Platelets: 353 10*3/uL (ref 150–400)
RBC: 3.58 MIL/uL — ABNORMAL LOW (ref 3.87–5.11)
RDW: 14.1 % (ref 11.5–15.5)
WBC: 6.4 10*3/uL (ref 4.0–10.5)
nRBC: 0.5 % — ABNORMAL HIGH (ref 0.0–0.2)

## 2020-03-03 LAB — COMPREHENSIVE METABOLIC PANEL
ALT: 16 U/L (ref 0–44)
AST: 14 U/L — ABNORMAL LOW (ref 15–41)
Albumin: 3.8 g/dL (ref 3.5–5.0)
Alkaline Phosphatase: 76 U/L (ref 38–126)
Anion gap: 3 — ABNORMAL LOW (ref 5–15)
BUN: 11 mg/dL (ref 8–23)
CO2: 31 mmol/L (ref 22–32)
Calcium: 9.3 mg/dL (ref 8.9–10.3)
Chloride: 105 mmol/L (ref 98–111)
Creatinine, Ser: 0.86 mg/dL (ref 0.44–1.00)
GFR calc Af Amer: >60 mL/min (ref >60)
GFR calc non Af Amer: >60 mL/min (ref >60)
Glucose, Bld: 119 mg/dL — ABNORMAL HIGH (ref 70–99)
Potassium: 4.2 mmol/L (ref 3.5–5.1)
Sodium: 139 mmol/L (ref 135–145)
Total Bilirubin: 0.5 mg/dL (ref 0.3–1.2)
Total Protein: 7 g/dL (ref 6.5–8.1)

## 2020-03-03 MED ORDER — DIPHENHYDRAMINE HCL 50 MG/ML IJ SOLN
INTRAMUSCULAR | Status: AC
  Filled 2020-03-03: qty 1

## 2020-03-03 MED ORDER — SODIUM CHLORIDE 0.9% FLUSH
10.0000 mL | INTRAVENOUS | Status: DC | PRN
  Administered 2020-03-03: 10 mL
  Filled 2020-03-03: qty 10

## 2020-03-03 MED ORDER — PACLITAXEL PROTEIN-BOUND CHEMO INJECTION 100 MG
100.0000 mg/m2 | Freq: Once | INTRAVENOUS | Status: AC
  Administered 2020-03-03: 200 mg via INTRAVENOUS
  Filled 2020-03-03: qty 40

## 2020-03-03 MED ORDER — ACETAMINOPHEN 325 MG PO TABS
650.0000 mg | ORAL_TABLET | Freq: Once | ORAL | Status: AC
  Administered 2020-03-03: 650 mg via ORAL

## 2020-03-03 MED ORDER — SODIUM CHLORIDE 0.9% FLUSH
10.0000 mL | INTRAVENOUS | Status: DC | PRN
  Filled 2020-03-03: qty 10

## 2020-03-03 MED ORDER — SODIUM CHLORIDE 0.9 % IV SOLN
Freq: Once | INTRAVENOUS | Status: AC
  Filled 2020-03-03: qty 250

## 2020-03-03 MED ORDER — ACETAMINOPHEN 325 MG PO TABS
ORAL_TABLET | ORAL | Status: AC
  Filled 2020-03-03: qty 2

## 2020-03-03 MED ORDER — DIPHENHYDRAMINE HCL 50 MG/ML IJ SOLN
25.0000 mg | Freq: Once | INTRAMUSCULAR | Status: AC
  Administered 2020-03-03: 25 mg via INTRAVENOUS

## 2020-03-03 MED ORDER — TRASTUZUMAB-ANNS CHEMO 150 MG IV SOLR
2.0000 mg/kg | Freq: Once | INTRAVENOUS | Status: AC
  Administered 2020-03-03: 189 mg via INTRAVENOUS
  Filled 2020-03-03: qty 9

## 2020-03-03 MED ORDER — PROCHLORPERAZINE EDISYLATE 10 MG/2ML IJ SOLN
10.0000 mg | Freq: Once | INTRAMUSCULAR | Status: AC
  Administered 2020-03-03: 10 mg via INTRAVENOUS

## 2020-03-03 MED ORDER — PROCHLORPERAZINE EDISYLATE 10 MG/2ML IJ SOLN
INTRAMUSCULAR | Status: AC
  Filled 2020-03-03: qty 2

## 2020-03-03 MED ORDER — HEPARIN SOD (PORK) LOCK FLUSH 100 UNIT/ML IV SOLN
500.0000 [IU] | Freq: Once | INTRAVENOUS | Status: AC | PRN
  Administered 2020-03-03: 500 [IU]
  Filled 2020-03-03: qty 5

## 2020-03-03 NOTE — Addendum Note (Signed)
Addended by: Chauncey Cruel on: 03/03/2020 06:09 PM   Modules accepted: Orders

## 2020-03-03 NOTE — Patient Instructions (Signed)

## 2020-03-03 NOTE — Patient Instructions (Signed)
[facility] Discharge Instructions for Patients Receiving Chemotherapy  Today you received the following chemotherapy agents: Trastuzumab and Abraxane  To help prevent nausea and vomiting after your treatment, we encourage you to take your nausea medication  as prescribed.    If you develop nausea and vomiting that is not controlled by your nausea medication, call the clinic.   BELOW ARE SYMPTOMS THAT SHOULD BE REPORTED IMMEDIATELY:  *FEVER GREATER THAN 100.5 F  *CHILLS WITH OR WITHOUT FEVER  NAUSEA AND VOMITING THAT IS NOT CONTROLLED WITH YOUR NAUSEA MEDICATION  *UNUSUAL SHORTNESS OF BREATH  *UNUSUAL BRUISING OR BLEEDING  TENDERNESS IN MOUTH AND THROAT WITH OR WITHOUT PRESENCE OF ULCERS  *URINARY PROBLEMS  *BOWEL PROBLEMS  UNUSUAL RASH Items with * indicate a potential emergency and should be followed up as soon as possible.  Feel free to call the clinic should you have any questions or concerns. The clinic phone number is [phone].  Please show the CHEMO ALERT CARD at check-in to the Emergency Department and triage nurse.   

## 2020-03-03 NOTE — Progress Notes (Signed)
[place]  [phone] [phone]     ID: MCKENZY SALAZAR DOB: [birth year]  MR#: [id number]  VOP#:[id number]  Patient Care Team: Unk Pinto, MD as PCP - General (Internal Medicine) [name], RN as Oncology Nurse Navigator [name], RN as Oncology Nurse Navigator Erroll Luna, MD as Consulting Physician (General Surgery) Magrinat, Virgie Dad, MD as Consulting Physician (Oncology) Eppie Gibson, MD as Attending Physician (Radiation Oncology) Mcarthur Rossetti, MD as Consulting Physician (Orthopedic Surgery) Regal, Tamala Fothergill, DPM as Consulting Physician (Podiatry) Chauncey Cruel, MD OTHER MD:  CHIEF COMPLAINT: triple positive breast cancer (s/p bilateral mastectomies)  CURRENT TREATMENT: Adjuvant chemo immunotherapy   INTERVAL HISTORY: Tracy Garrett returns today for follow up and treatment of her triple positive breast cancer.   She will receive her fifth of 12 planned paclitaxel/trastuzumab treatment today, to be followed by trastuzumab alone to complete 1 year.  Her most recent echocardiogram performed on 12/13/2019 showed an ejection fraction of 55-60%.   REVIEW OF SYSTEMS: Ashli is tolerating her treatment well so far.  In particular there has been no peripheral neuropathy developing.  She is somewhat fatigued after treatment.  She had some left flank discomfort this past weekend but that has abated.  A detailed review of systems today was otherwise stable.   HISTORY OF CURRENT ILLNESS: From the original intake note:  Tracy Garrett had routine screening mammography on 05/01/2020 showing a possible abnormality in the right breast. She underwent right diagnostic mammography with tomography at [facility] on 05/09/2019 showing: breast density category A; probably-benign regional calcifications in the right breast at 11 o'clock. Short term follow up was recommended.   She returned for follow up at [facility] on  11/13/2019. She underwent right diagnostic mammography and right breast ultrasonography showing: breast composition A; indeterminate calcifications in upper-outer right breast spanning 5 cm; 0.8 cm irregular mass in right breast at 9 o'clock.  Accordingly on 11/20/2019 she proceeded to biopsy of the right breast areas in question. The pathology from this procedure ([id number]) showed:  1. Right breast, UOQ  - foci of invasive and in situ mammary carcinoma, grade 2-3, e-cadherin positive.  2. Right breast, 9 o'clock  - invasive mammary carcinoma, grade 2-3, e-cadherin positive  - Prognostic indicators significant for: estrogen receptor, 95% positive and progesterone receptor, 25% positive, both with strong staining intensity. Proliferation marker Ki67 at 20%. HER2 equivocal by immunohistochemistry (2+), but positive by fluorescent in situ hybridization with a signals ratio 3.30 and number per cell 7.25.  The patient's subsequent history is as detailed below.   PAST MEDICAL HISTORY: Past Medical History:  Diagnosis Date  . Allergy   . Anemia   . Arthritis   . Cancer [facility])    right breast cancer  . Cataracts, bilateral   . Chronic kidney disease    CKD  . Family history of breast cancer   . Family history of multiple myeloma   . Fatty liver disease, nonalcoholic 5/79/0383   [place] appears normal on follow up US 09/2019  . GERD (gastroesophageal reflux disease)   . History of kidney stones    x2 episodes  . Hyperlipidemia   . Hypertension   . PONV (postoperative nausea and vomiting)   . Sleep apnea    test was dx, as mild- no further tx. required  . Type II or unspecified type diabetes mellitus without mention of complication, not stated as uncontrolled     PAST SURGICAL HISTORY: Past Surgical History:  Procedure Laterality Date  . BREAST SURGERY  1999   Reduction Mammoplasties  . CHOLECYSTECTOMY  2003  . colonoscopsy  11/2007   neg- polyp removal with both procedures x2  .  CYSTOSCOPY     surgery to extract stone x 1 with stent  . EUS N/A 07/26/2014   Procedure: UPPER ENDOSCOPIC ULTRASOUND (EUS) LINEAR;  Surgeon: Beryle Beams, MD;  Location: [facility];  Service: Endoscopy;  Laterality: N/A;  . 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 LYSIS OF ADHESION  10/10/2014   Procedure: LYSIS OF ADHESION;  Surgeon: TJackolyn Confer MD;  Location: [facility] ORS;  Service: General;;  . MASTECTOMY W/ SENTINEL NODE BIOPSY Bilateral 01/02/2020   Procedure: BILATERAL MASTECTOMY WITH RIGHT SENTINEL LYMPH NODE MAPPING;  Surgeon: CErroll Luna MD;  Location: [facility]  Service: General;  Laterality: Bilateral;  PEC BLOCK  . PORTACATH PLACEMENT Right 01/02/2020   Procedure: INSERTION PORT-A-CATH WITH ULTRASOUND GUIDANCE;  Surgeon: CErroll Luna MD;  Location: [facility]  Service: General;  Laterality: Right;  . TONSILLECTOMY     and adenoids removed age [age] . VAGINAL HYSTERECTOMY  2002    FAMILY HISTORY: Family History  Problem Relation Age of Onset  . Breast cancer Mother 721 . Heart disease Father        smoker  . Diabetes type I Half-Sister   . Alzheimer's disease Maternal Grandmother   . Emphysema Paternal Grandfather        smoker  . Heart attack Paternal Grandfather   . Lung cancer Paternal Grandfather   . CAD Brother 526      stents  . Breast cancer Maternal Aunt        dx. in her late 771s . Breast cancer Cousin 571 . Cancer Maternal Uncle        dx. in his 661s tumor on the back of neck  . Breast cancer Paternal Aunt        dx. >50  . Multiple myeloma Maternal Aunt 92  . Multiple myeloma Paternal Aunt        dx. >50  . Breast cancer Cousin 573  Her father died at age 20461 Her mother died at age 10882 She has 2 brothers and had 1 sister who is deceased. She reports breast cancer on her maternal side-- her mother at age  64 an aunt, and a cousin.   GYNECOLOGIC HISTORY:  No LMP recorded. Patient has had a hysterectomy. Menarche: 64years old Age at first live birth: 64years old G[place]per[place] 2 LMP unsure Contraceptive: never used HRT never used  Hysterectomy? Yes, 2002 BSO? no   SOCIAL HISTORY: (updated 11/2019)  MR[place]andais currently working for [employer] Husband JDenyse Amassis       . Son AKatherine Basset age 64 is self-employed in [place] age 64 [place]     ADVANCED DIRECTIVES: In the absence of any documentation to the contrary, the patient's spouse is their HCPOA.    HEALTH MAINTENANCE: Social History   Tobacco Use  . Smoking status: Never Smoker  . Smokeless tobacco: Never Used  Vaping Use  . Vaping Use: Never used  Substance Use Topics  . Alcohol use: No  . Drug use: No     Colonoscopy: 02/2019 (Dr. MCollene Mares, repeat due 2025  PAP: none on file (s/p hysterectomy)  Bone density: never done   Allergies  Allergen Reactions  . Hydromorphone Rash    Arm reddened at IV med site  . Other Itching and Rash    NUTS CAUSE MOUTH TO [facility]    Current Outpatient Medications  Medication Sig Dispense Refill  . ALPRAZolam (XANAX) 1 MG tablet Take 1 tablet 1 hour prior to procedure.  Then take 1 tablet right before procedure if needed (Patient not taking: Reported on 12/24/2019) 2 tablet 0  . aspirin EC 81 MG tablet Take 81 mg by mouth daily. Swallow whole.    . cetirizine (ZYRTEC) 10 MG tablet Take 10 mg by mouth daily.    . Cholecalciferol (VITAMIN D3) 5000 UNITS TABS Take 5,000 Units by mouth daily.     Marland Kitchen glucose blood (CONTOUR NEXT TEST) test strip Check blood sugar 1 time daily-DX-E11.9 100 each 12  . ibuprofen (ADVIL) 800 MG tablet Take 1 tablet (800 mg total) by mouth every 8 (eight) hours as needed. 30 tablet 0  . lidocaine-prilocaine (EMLA) cream Apply to affected area once 30 g 3  . losartan (COZAAR) 50 MG tablet TAKE 1/2-1 TAB DAILY FOR BLOOD PRESSURE  AND TO PROTECT KIDNEYS. 30 tablet 5  . metFORMIN (GLUCOPHAGE XR) 500 MG 24 hr tablet Take 1 tablet (500 mg total) by mouth daily with breakfast. 30 tablet 11  . Omega-3 Fatty Acids (OMEGA 3 PO) Take 1 capsule by mouth 2 (two) times daily. (Patient not taking: Reported on 12/24/2019)    . OVER THE COUNTER MEDICATION Takes CoQ10 1 capsule daily (Patient not taking: Reported on 12/24/2019)    . oxyCODONE (OXY IR/ROXICODONE) 5 MG immediate release tablet Take 1 tablet (5 mg total) by mouth every 6 (six) hours as needed for severe pain. 15 tablet 0  . pantoprazole (PROTONIX) 40 MG tablet Take 40 mg by mouth daily.     . prochlorperazine (COMPAZINE) 10 MG tablet Take 1 tablet (10 mg total) by mouth every 6 (six) hours as needed (Nausea or vomiting). 30 tablet 1  . rosuvastatin (CRESTOR) 5 MG tablet Take 1 tablet daily for Cholesterol (Patient taking differently: Take 5 mg by mouth daily. ) 90 tablet 1  . sulfamethoxazole-trimethoprim (BACTRIM DS) 800-160 MG tablet Take 1 tablet by mouth 2 (two) times daily. (Patient not taking: Reported on 12/24/2019) 10 tablet 0   No current facility-administered medications for this visit.   Facility-Administered Medications Ordered in Other Visits  Medication Dose Route Frequency Provider Last Rate Last Admin  . heparin lock flush 100 unit/mL  500 Units Intracatheter Once PRN Magrinat, Virgie Dad, MD      . PACLitaxel-protein bound (ABRAXANE) chemo infusion 200 mg  100 mg/m2 (Treatment Plan Recorded) Intravenous Once Magrinat, Virgie Dad, MD 80 mL/hr at 03/03/20 1633 200 mg at 03/03/20 1633  . sodium chloride flush (NS) 0.9 % injection 10 mL  10 mL Intracatheter PRN Magrinat, Virgie Dad, MD        OBJECTIVE: African-American woman evaluated in the treatment area  There were no vitals filed for this visit.   There is no height or weight on file to calculate BMI.   Wt Readings from Last 3 Encounters:  03/03/20 217 lb 4 oz (98.5 kg)  02/25/20 217 lb (98.4 kg)  02/18/20 216  lb 14.4 oz (98.4 kg)  For vitals on 03/03/2020 see the treatment area flowsheet    ECOG FS:1 - Symptomatic but completely ambulatory  Sclerae unicteric, EOMs intact  Wearing a mask Lungs no rales or rhonchi Heart regular rate and rhythm Abd soft, nontender, positive bowel sounds Neuro: nonfocal, well oriented, appropriate affect Breasts: Deferred.  She is status post bilateral mastectomies.   LAB RESULTS:  CMP     Component Value Date/Time   NA 139 03/03/2020 1343   K 4.2 03/03/2020 1343   CL 105 03/03/2020 1343   CO2 31 03/03/2020 1343   GLUCOSE 119 (H) 03/03/2020 1343   BUN 11 03/03/2020 1343   CREATININE 0.86 03/03/2020 1343   CREATININE 0.94 11/28/2019 1217   CREATININE 0.82 11/26/2019 1533   CALCIUM 9.3 03/03/2020 1343   PROT 7.0 03/03/2020 1343   ALBUMIN 3.8 03/03/2020 1343   AST 14 (L) 03/03/2020 1343   AST 10 (L) 11/28/2019 1217   ALT 16 03/03/2020 1343   ALT 16 11/28/2019 1217   ALKPHOS 76 03/03/2020 1343   BILITOT 0.5 03/03/2020 1343   BILITOT 0.6 11/28/2019 1217   GFRNONAA >60 03/03/2020 1343   GFRNONAA >60 11/28/2019 1217   GFRNONAA 76 11/26/2019 1533   GFRAA >60 03/03/2020 1343   GFRAA >60 11/28/2019 1217   GFRAA 88 11/26/2019 1533    Lab Results  Component Value Date   ALBUMINELP 3.9 08/23/2019   A1GS 0.4 (H) 08/23/2019   A2GS 0.9 08/23/2019   BETS 0.5 08/23/2019   BETA2SER 0.4 08/23/2019   GAMS 0.9 08/23/2019   SPEI  08/23/2019     Comment:     . Alpha-1 globulin increase noted. .     Lab Results  Component Value Date   WBC 6.4 03/03/2020   NEUTROABS 2.9 03/03/2020   HGB 10.7 (L) 03/03/2020   HCT 32.6 (L) 03/03/2020   MCV 91.1 03/03/2020   PLT 353 03/03/2020    No results found for: LABCA2  No components found for: [id number]  No results for input(s): INR in the last 168 hours.  No results found for: LABCA2  No results found for: ZDG644  No results found for: IHK742  No results found for: VZD638  No results found for:  CA2729  No components found for: HGQUANT  No results found for: CEA1 / No results found for: CEA1   No results found for: AFPTUMOR  No results found for: CHROMOGRNA  No results found for: KPAFRELGTCHN, LAMBDASER, KAPLAMBRATIO (kappa/lambda light chains)  No results found for: HGBA, HGBA2QUANT, HGBFQUANT, HGBSQUAN (Hemoglobinopathy evaluation)   No results found for: LDH  Lab Results  Component Value Date   IRON 54 11/26/2019   TIBC 378 11/26/2019   IRONPCTSAT 14 (L) 11/26/2019   (Iron and TIBC)  Lab Results  Component Value Date   FERRITIN 91 05/21/2015    Urinalysis    Component Value Date/Time   COLORURINE YELLOW 11/26/2019 1533   APPEARANCEUR CLOUDY (A) 11/26/2019 1533   LABSPEC 1.019 11/26/2019 1533   PHURINE < OR = 5.0 11/26/2019 1533   GLUCOSEU 1+ (A) 11/26/2019 1533   HGBUR NEGATIVE 11/26/2019 1533   BILIRUBINUR NEG 11/22/2013 1031   BILIRUBINUR neg 10/28/2013 1159   KETONESUR NEGATIVE 11/26/2019 1533   PROTEINUR 2+ (A) 11/26/2019 1533   UROBILINOGEN 0.2 11/22/2013 1031   NITRITE NEGATIVE 08/13/2019 1359   LEUKOCYTESUR 1+ (A) 08/13/2019 1359    STUDIES: No results found.   ELIGIBLE FOR AVAILABLE RESEARCH PROTOCOL: AET  ASSESSMENT: 64 y.o. [place] woman status post right breast upper outer quadrant biopsy 11/20/2019 for a clinical mT1b N0, stage IA invasive ductal carcinoma, grade 2 or 3, estrogen and progesterone  receptor positive, HER-2 amplified, with an MIB-1 of 20%  (1) status post bilateral mastectomies 01/02/2020 showing  (a) on the right, an mpT2 pN0(i+), stage IB invasive ductal carcinoma, triple positive, with close but negative margins; 2 sentinel lymph nodes removed  (b) on the left, no malignancy identified  (c) the patient is not planning on reconstruction  (2) adjuvant chemo immunotherapy will consist of Abraxane weekly x12 with trastuzumab  (a) echo 12/13/2019 shows an ejection fraction in the 55-60% range  (3) trastuzumab to  be continued to total 1 year  (4) adjuvant radiation  (5) genetics testing 12/16/2019 through the [facility] Breast Cancer STAT panel + Common Hereditary Cancers panel found no deleterious mutations in ATM, BRCA1, BRCA2, CDH1, CHEK2, PALB2, PTEN, STK11 and TP53. The Common Hereditary Cancers Panel offered by [facility] includes sequencing and/or deletion duplication testing of the following 48 genes: APC, ATM, AXIN2, BARD1, BMPR1A, BRCA1, BRCA2, BRIP1, CDH1, CDK4, CDKN2A (p14ARF), CDKN2A (p16INK4a), CHEK2, CTNNA1, DICER1, EPCAM (Deletion/duplication testing only), GREM1 (promoter region deletion/duplication testing only), KIT, MEN1, MLH1, MSH2, MSH3, MSH6, MUTYH, NBN, NF1, NTHL1, PALB2, PDGFRA, PMS2, POLD1, POLE, PTEN, RAD50, RAD51C, RAD51D, RNF43, SDHB, SDHC, SDHD, SMAD4, SMARCA4. STK11, TP53, TSC1, TSC2, and VHL.  The following genes were evaluated for sequence changes only: SDHA and HOXB13 c.251G>A variant only.  (a) Two variants of uncertain significance (VUS) were detected - one in the BRIP1 gene called c.1735C>T and a second in the POLD1 gene called c.2861C>G.   (6) antiestrogens to follow-up   PLAN: Arlette is tolerating her paclitaxel and trastuzumab remarkably well.  We are proceeding with cycle #5 today.  We will see her again next week and then every other treatment until her last few treatments when we will again start seeing her on a weekly basis.  She will be due for repeat echocardiography in October  Total encounter time 25 minutes.Sarajane Jews C. Reeve Mallo, MD 03/03/2020 5:01 PM Medical Oncology and Hematology [facility] [place], [place] [address] Tel. [phone]    Fax. [phone]   This document serves as a record of services personally performed by Lurline Del, MD. It was created on his behalf by Wilburn Mylar, a trained medical scribe. The creation of this record is based on the scribe's personal observations and the provider's statements  to them.   I, Lurline Del MD, have reviewed the above documentation for accuracy and completeness, and I agree with the above.   *Total Encounter Time as defined by the Centers for Medicare and Medicaid Services includes, in addition to the face-to-face time of a patient visit (documented in the note above) non-face-to-face time: obtaining and reviewing outside history, ordering and reviewing medications, tests or procedures, care coordination (communications with other health care professionals or caregivers) and documentation in the medical record.

## 2020-03-05 ENCOUNTER — Telehealth: Payer: Self-pay | Admitting: Oncology

## 2020-03-05 ENCOUNTER — Encounter: Payer: Self-pay | Admitting: *Deleted

## 2020-03-05 NOTE — Telephone Encounter (Signed)
Scheduled appts per 9/27 los. Pt confirmed next appt date and time.

## 2020-03-11 NOTE — Progress Notes (Signed)
[facility] Progress Note   Tracy Garrett [id number] Aug 02, 1955 64 y.o.  Tracy Garrett is managed by Dr. Lurline Garrett  Actively treated with chemotherapy/immunotherapy/hormonal therapy: yes  Current therapy: paclitaxel and trastuzumab   Last treated: 03/03/2020 (cycle # 5)  Next scheduled appointment with provider: 03/24/2020  Assessment: Plan:    Malignant neoplasm of upper-outer quadrant of right breast in female, estrogen receptor positive (Tracy Garrett)   ER positive malignant neoplasm of the right breast: Tracy Garrett presents to the clinic today for consideration of cycle 6 of paclitaxel and trastuzumab.  She continues to be followed by Dr. Jana Garrett and is scheduled to see him in follow-up on 03/24/2020.  Please see After Visit Summary for patient specific instructions.  Future Appointments  Date Time Provider [place]  03/12/2020  7:30 AM [facility] None  03/12/2020  7:45 AM [facility] [place] FLUSH [facility] None  03/12/2020  8:00 AM [facility] INFUSION [facility] None  03/12/2020  9:00 AM Tracy Vences E., PA-C [facility] None  03/17/2020  8:00 AM [facility] LAB [facility] None  03/17/2020  8:15 AM [facility] [place] FLUSH [facility] None  03/17/2020  9:00 AM [facility] INFUSION [facility] None  03/24/2020  7:45 AM [facility] LAB [facility] None  03/24/2020  8:00 AM [facility] [place] FLUSH [facility] None  03/24/2020  8:30 AM Magrinat, Virgie Dad, MD [facility] None  03/24/2020  9:00 AM [facility] INFUSION [facility] None  03/31/2020  8:00 AM [facility] LAB [facility] None  03/31/2020  8:15 AM [facility] [place] FLUSH [facility] None  03/31/2020  9:00 AM [facility] INFUSION [facility] None  04/07/2020  9:15 AM [facility] LAB [facility] None  04/07/2020  9:30 AM [facility] [place] FLUSH [facility] None  04/07/2020 10:15 AM [facility] INFUSION [facility] None  04/14/2020  8:30 AM [facility] LAB [facility] None  04/14/2020  8:45  AM [facility] [place] FLUSH [facility] None  04/14/2020  9:45 AM [facility] INFUSION [facility] None    No orders of the defined types were placed in this encounter.      Subjective:   Patient ID:  Tracy Garrett is a 64 y.o. (DOB Feb 25, 1956) female.  Chief Complaint: No chief complaint on file.   HPI [name]  is a 64 y.o. female with a diagnosis of an ER positive malignant neoplasm of the right breast. She is followed by Dr. Jana Garrett and  presents to the clinic today for consideration of cycle 6 of paclitaxel and trastuzumab.  Tracy Garrett continues to tolerate chemotherapy well without any issues of concern. She denies any issues today.   Medications: I have reviewed the patient's current medications.  Allergies:  Allergies  Allergen Reactions  . Hydromorphone Rash    Arm reddened at IV med site  . Other Itching and Rash    NUTS CAUSE MOUTH TO [facility]    Past Medical History:  Diagnosis Date  . Allergy   . Anemia   . Arthritis   . Cancer [facility])    right breast cancer  . Cataracts, bilateral   . Chronic kidney disease    CKD  . Family history of breast cancer   . Family history of multiple myeloma   . Fatty liver disease, nonalcoholic 10/01/8339   [place] appears normal on follow up US 09/2019  . GERD (gastroesophageal reflux disease)   . History of kidney stones    x2 episodes  . Hyperlipidemia   . Hypertension   . PONV (postoperative nausea and vomiting)   . Sleep apnea    test was  dx, as mild- no further tx. required  . Type II or unspecified type diabetes mellitus without mention of complication, not stated as uncontrolled     Past Surgical History:  Procedure Laterality Date  . BREAST SURGERY  1999   Reduction Mammoplasties  . CHOLECYSTECTOMY  2003  . colonoscopsy  11/2007   neg- polyp removal with both procedures x2  . CYSTOSCOPY     surgery to extract stone x 1 with stent  . EUS N/A 07/26/2014   Procedure: UPPER ENDOSCOPIC ULTRASOUND (EUS)  LINEAR;  Surgeon: Beryle Beams, MD;  Location: [facility];  Service: Endoscopy;  Laterality: N/A;  . 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 LYSIS OF ADHESION  10/10/2014   Procedure: LYSIS OF ADHESION;  Surgeon: TJackolyn Confer MD;  Location: [facility];  Service: General;;  . MASTECTOMY W/ SENTINEL NODE BIOPSY Bilateral 01/02/2020   Procedure: BILATERAL MASTECTOMY WITH RIGHT SENTINEL LYMPH NODE MAPPING;  Surgeon: CErroll Luna MD;  [place] [facility]  Service: General;  Laterality: Bilateral;  PEC BLOCK  . PORTACATH PLACEMENT Right 01/02/2020   Procedure: INSERTION PORT-A-CATH WITH ULTRASOUND GUIDANCE;  Surgeon: CErroll Luna MD;  [place]  Service: General;  Laterality: Right;  . TONSILLECTOMY     and adenoids removed age 89 . VAGINAL HYSTERECTOMY  2002    Family History  Problem Relation Age of Onset  . Breast cancer Mother 762 . Heart disease Father        smoker  . Diabetes type I Half-Sister   . Alzheimer's disease Maternal Grandmother   . Emphysema Paternal Grandfather        smoker  . Heart attack Paternal Grandfather   . Lung cancer Paternal Grandfather   . CAD Brother 574      stents  . Breast cancer Maternal Aunt        dx. in her late 728s . Breast cancer Cousin 561 . Cancer Maternal Uncle        dx. in his 649s tumor on the back of neck  . Breast cancer Paternal Aunt        dx. >50  . Multiple myeloma Maternal Aunt 92  . Multiple myeloma Paternal Aunt        dx. >50  . Breast cancer Cousin 560   Social History   Socioeconomic History  . Marital status: Married    Spouse name: Not on file  . Number of children: 2  . Years of education: 4 years of college  . Highest education level: Some college, no degree  Occupational History  . Not on file  Tobacco Use  . Smoking status: Never Smoker  .  Smokeless tobacco: Never Used  Vaping Use  . Vaping Use: Never used  Substance and Sexual Activity  . Alcohol use: No  . Drug use: No  . Sexual activity: Not Currently    Partners: Male    Birth control/protection: Post-menopausal  Other Topics Concern  . Not on file  Social History Narrative   Lives at home with her husband and youngest son   Right handed   Drinks occasional tea, not daily   Social Determinants of Health   Financial Resource Strain:   . Difficulty of Paying Living Expenses: Not on file  Food Insecurity:   . Worried About REstate manager/land agent  of Food in the Last Year: Not on file  . Ran Out of Food in the Last Year: Not on file  Transportation Needs:   . Lack of Transportation (Medical): Not on file  . Lack of Transportation (Non-Medical): Not on file  Physical Activity:   . Days of Exercise per Week: Not on file  . Minutes of Exercise per Session: Not on file  Stress:   . Feeling of Stress : Not on file  Social Connections:   . Frequency of Communication with Friends and Family: Not on file  . Frequency of Social Gatherings with Friends and Family: Not on file  . Attends Religious Services: Not on file  . Active Member of Clubs or Organizations: Not on file  . Attends Archivist Meetings: Not on file  . Marital Status: Not on file  Intimate Partner Violence:   . Fear of Current or Ex-Partner: Not on file  . Emotionally Abused: Not on file  . Physically Abused: Not on file  . Sexually Abused: Not on file    Past Medical History, Surgical history, Social history, and Family history were reviewed and updated as appropriate.   Please see review of systems for further details on the patient's review from today.   Review of Systems:  Review of Systems  Constitutional: Negative for chills, diaphoresis and fever.  HENT: Negative for trouble swallowing and voice change.   Respiratory: Negative for cough, chest tightness, shortness of breath and wheezing.    Cardiovascular: Negative for chest pain and palpitations.  Gastrointestinal: Negative for abdominal pain, constipation, diarrhea, nausea and vomiting.  Musculoskeletal: Negative for back pain and myalgias.  Neurological: Negative for dizziness, light-headedness and headaches.    Objective:   Physical Exam:  There were no vitals taken for this visit. ECOG: 0  Physical Exam Constitutional:      General: She is not in acute distress.    Appearance: She is not diaphoretic.  HENT:     Head: Normocephalic and atraumatic.  Eyes:     General: No scleral icterus.       Right eye: No discharge.        Left eye: No discharge.     Conjunctiva/sclera: Conjunctivae normal.  Cardiovascular:     Rate and Rhythm: Normal rate and regular rhythm.     Heart sounds: Normal heart sounds. No murmur heard.  No friction rub. No gallop.   Pulmonary:     Effort: Pulmonary effort is normal. No respiratory distress.     Breath sounds: Normal breath sounds. No wheezing or rales.  Skin:    General: Skin is warm and dry.     Findings: No erythema or rash.  Neurological:     Mental Status: She is alert.     Coordination: Coordination normal.     Gait: Gait normal.  Psychiatric:        Mood and Affect: Mood normal.        Behavior: Behavior normal.        Thought Content: Thought content normal.        Judgment: Judgment normal.     Lab Review:     Component Value Date/Time   NA 139 03/03/2020 1343   K 4.2 03/03/2020 1343   CL 105 03/03/2020 1343   CO2 31 03/03/2020 1343   GLUCOSE 119 (H) 03/03/2020 1343   BUN 11 03/03/2020 1343   CREATININE 0.86 03/03/2020 1343   CREATININE 0.94 11/28/2019 1217   CREATININE 0.82  11/26/2019 1533   CALCIUM 9.3 03/03/2020 1343   PROT 7.0 03/03/2020 1343   ALBUMIN 3.8 03/03/2020 1343   AST 14 (L) 03/03/2020 1343   AST 10 (L) 11/28/2019 1217   ALT 16 03/03/2020 1343   ALT 16 11/28/2019 1217   ALKPHOS 76 03/03/2020 1343   BILITOT 0.5 03/03/2020 1343    BILITOT 0.6 11/28/2019 1217   GFRNONAA >60 03/03/2020 1343   GFRNONAA >60 11/28/2019 1217   GFRNONAA 76 11/26/2019 1533   GFRAA >60 03/03/2020 1343   GFRAA >60 11/28/2019 1217   GFRAA 88 11/26/2019 1533       Component Value Date/Time   WBC 6.4 03/03/2020 1343   RBC 3.58 (L) 03/03/2020 1343   HGB 10.7 (L) 03/03/2020 1343   HGB 13.3 11/28/2019 1217   HCT 32.6 (L) 03/03/2020 1343   PLT 353 03/03/2020 1343   PLT 267 11/28/2019 1217   MCV 91.1 03/03/2020 1343   MCV 93.4 02/08/2013 1230   MCH 29.9 03/03/2020 1343   MCHC 32.8 03/03/2020 1343   RDW 14.1 03/03/2020 1343   LYMPHSABS 2.7 03/03/2020 1343   MONOABS 0.6 03/03/2020 1343   EOSABS 0.1 03/03/2020 1343   BASOSABS 0.0 03/03/2020 1343   -------------------------------  Imaging from last 24 hours (if applicable):  Radiology interpretation: No results found.

## 2020-03-12 ENCOUNTER — Inpatient Hospital Stay: Payer: 59 | Attending: Oncology

## 2020-03-12 ENCOUNTER — Other Ambulatory Visit: Payer: Self-pay | Admitting: Oncology

## 2020-03-12 ENCOUNTER — Inpatient Hospital Stay: Payer: 59

## 2020-03-12 ENCOUNTER — Encounter: Payer: Self-pay | Admitting: *Deleted

## 2020-03-12 ENCOUNTER — Inpatient Hospital Stay (HOSPITAL_BASED_OUTPATIENT_CLINIC_OR_DEPARTMENT_OTHER): Payer: 59 | Admitting: Medical

## 2020-03-12 ENCOUNTER — Other Ambulatory Visit: Payer: Self-pay

## 2020-03-12 VITALS — BP 143/71 | HR 93 | Temp 98.3°F | Resp 18 | Ht 62.0 in | Wt 221.5 lb

## 2020-03-12 DIAGNOSIS — Z9013 Acquired absence of bilateral breasts and nipples: Secondary | ICD-10-CM | POA: Insufficient documentation

## 2020-03-12 DIAGNOSIS — Z95828 Presence of other vascular implants and grafts: Secondary | ICD-10-CM

## 2020-03-12 DIAGNOSIS — C50411 Malignant neoplasm of upper-outer quadrant of right female breast: Secondary | ICD-10-CM | POA: Insufficient documentation

## 2020-03-12 DIAGNOSIS — K219 Gastro-esophageal reflux disease without esophagitis: Secondary | ICD-10-CM | POA: Diagnosis not present

## 2020-03-12 DIAGNOSIS — Z170 Estrogen receptor positive status [ER+]: Secondary | ICD-10-CM | POA: Insufficient documentation

## 2020-03-12 DIAGNOSIS — Z5111 Encounter for antineoplastic chemotherapy: Secondary | ICD-10-CM | POA: Insufficient documentation

## 2020-03-12 DIAGNOSIS — E119 Type 2 diabetes mellitus without complications: Secondary | ICD-10-CM | POA: Diagnosis not present

## 2020-03-12 DIAGNOSIS — Z923 Personal history of irradiation: Secondary | ICD-10-CM | POA: Diagnosis not present

## 2020-03-12 DIAGNOSIS — E785 Hyperlipidemia, unspecified: Secondary | ICD-10-CM | POA: Diagnosis not present

## 2020-03-12 DIAGNOSIS — I10 Essential (primary) hypertension: Secondary | ICD-10-CM | POA: Insufficient documentation

## 2020-03-12 DIAGNOSIS — E1165 Type 2 diabetes mellitus with hyperglycemia: Secondary | ICD-10-CM

## 2020-03-12 DIAGNOSIS — N189 Chronic kidney disease, unspecified: Secondary | ICD-10-CM | POA: Insufficient documentation

## 2020-03-12 DIAGNOSIS — E559 Vitamin D deficiency, unspecified: Secondary | ICD-10-CM

## 2020-03-12 LAB — CBC WITH DIFFERENTIAL/PLATELET
Abs Immature Granulocytes: 0.24 10*3/uL — ABNORMAL HIGH (ref 0.00–0.07)
Basophils Absolute: 0.1 10*3/uL (ref 0.0–0.1)
Basophils Relative: 1 %
Eosinophils Absolute: 0.1 10*3/uL (ref 0.0–0.5)
Eosinophils Relative: 1 %
HCT: 30.1 % — ABNORMAL LOW (ref 36.0–46.0)
Hemoglobin: 9.9 g/dL — ABNORMAL LOW (ref 12.0–15.0)
Immature Granulocytes: 3 %
Lymphocytes Relative: 41 %
Lymphs Abs: 3.6 10*3/uL (ref 0.7–4.0)
MCH: 29.8 pg (ref 26.0–34.0)
MCHC: 32.9 g/dL (ref 30.0–36.0)
MCV: 90.7 fL (ref 80.0–100.0)
Monocytes Absolute: 0.8 10*3/uL (ref 0.1–1.0)
Monocytes Relative: 10 %
Neutro Abs: 3.8 10*3/uL (ref 1.7–7.7)
Neutrophils Relative %: 44 %
Platelets: 325 10*3/uL (ref 150–400)
RBC: 3.32 MIL/uL — ABNORMAL LOW (ref 3.87–5.11)
RDW: 15 % (ref 11.5–15.5)
WBC: 8.7 10*3/uL (ref 4.0–10.5)
nRBC: 0.7 % — ABNORMAL HIGH (ref 0.0–0.2)

## 2020-03-12 LAB — COMPREHENSIVE METABOLIC PANEL
ALT: 12 U/L (ref 0–44)
AST: 10 U/L — ABNORMAL LOW (ref 15–41)
Albumin: 3.5 g/dL (ref 3.5–5.0)
Alkaline Phosphatase: 68 U/L (ref 38–126)
Anion gap: 7 (ref 5–15)
BUN: 12 mg/dL (ref 8–23)
CO2: 26 mmol/L (ref 22–32)
Calcium: 9.2 mg/dL (ref 8.9–10.3)
Chloride: 106 mmol/L (ref 98–111)
Creatinine, Ser: 0.92 mg/dL (ref 0.44–1.00)
GFR calc non Af Amer: >60 mL/min (ref >60)
Glucose, Bld: 168 mg/dL — ABNORMAL HIGH (ref 70–99)
Potassium: 4 mmol/L (ref 3.5–5.1)
Sodium: 139 mmol/L (ref 135–145)
Total Bilirubin: 0.4 mg/dL (ref 0.3–1.2)
Total Protein: 6.6 g/dL (ref 6.5–8.1)

## 2020-03-12 MED ORDER — DIPHENHYDRAMINE HCL 25 MG PO TABS
25.0000 mg | ORAL_TABLET | Freq: Once | ORAL | Status: AC
  Administered 2020-03-12: 25 mg via ORAL
  Filled 2020-03-12: qty 1

## 2020-03-12 MED ORDER — HEPARIN SOD (PORK) LOCK FLUSH 100 UNIT/ML IV SOLN
500.0000 [IU] | Freq: Once | INTRAVENOUS | Status: AC | PRN
  Administered 2020-03-12: 500 [IU]
  Filled 2020-03-12: qty 5

## 2020-03-12 MED ORDER — ACETAMINOPHEN 325 MG PO TABS
650.0000 mg | ORAL_TABLET | Freq: Once | ORAL | Status: AC
  Administered 2020-03-12: 650 mg via ORAL

## 2020-03-12 MED ORDER — PACLITAXEL PROTEIN-BOUND CHEMO INJECTION 100 MG
100.0000 mg/m2 | Freq: Once | INTRAVENOUS | Status: AC
  Administered 2020-03-12: 200 mg via INTRAVENOUS
  Filled 2020-03-12: qty 40

## 2020-03-12 MED ORDER — SODIUM CHLORIDE 0.9% FLUSH
10.0000 mL | INTRAVENOUS | Status: DC | PRN
  Administered 2020-03-12: 10 mL
  Filled 2020-03-12: qty 10

## 2020-03-12 MED ORDER — DIPHENHYDRAMINE HCL 25 MG PO CAPS
ORAL_CAPSULE | ORAL | Status: AC
  Filled 2020-03-12: qty 1

## 2020-03-12 MED ORDER — PROCHLORPERAZINE EDISYLATE 10 MG/2ML IJ SOLN
10.0000 mg | Freq: Once | INTRAMUSCULAR | Status: AC
  Administered 2020-03-12: 10 mg via INTRAVENOUS

## 2020-03-12 MED ORDER — SODIUM CHLORIDE 0.9 % IV SOLN
Freq: Once | INTRAVENOUS | Status: AC
  Filled 2020-03-12: qty 250

## 2020-03-12 MED ORDER — PROCHLORPERAZINE EDISYLATE 10 MG/2ML IJ SOLN
INTRAMUSCULAR | Status: AC
  Filled 2020-03-12: qty 2

## 2020-03-12 MED ORDER — TRASTUZUMAB-ANNS CHEMO 150 MG IV SOLR
2.0000 mg/kg | Freq: Once | INTRAVENOUS | Status: AC
  Administered 2020-03-12: 189 mg via INTRAVENOUS
  Filled 2020-03-12: qty 9

## 2020-03-12 MED ORDER — ACETAMINOPHEN 325 MG PO TABS
ORAL_TABLET | ORAL | Status: AC
  Filled 2020-03-12: qty 2

## 2020-03-12 NOTE — Patient Instructions (Signed)
[facility] Discharge Instructions for Patients Receiving Chemotherapy  Today you received the following chemotherapy agents: Trastuzumab and Abraxane  To help prevent nausea and vomiting after your treatment, we encourage you to take your nausea medication  as prescribed.    If you develop nausea and vomiting that is not controlled by your nausea medication, call the clinic.   BELOW ARE SYMPTOMS THAT SHOULD BE REPORTED IMMEDIATELY:  *FEVER GREATER THAN 100.5 F  *CHILLS WITH OR WITHOUT FEVER  NAUSEA AND VOMITING THAT IS NOT CONTROLLED WITH YOUR NAUSEA MEDICATION  *UNUSUAL SHORTNESS OF BREATH  *UNUSUAL BRUISING OR BLEEDING  TENDERNESS IN MOUTH AND THROAT WITH OR WITHOUT PRESENCE OF ULCERS  *URINARY PROBLEMS  *BOWEL PROBLEMS  UNUSUAL RASH Items with * indicate a potential emergency and should be followed up as soon as possible.  Feel free to call the clinic should you have any questions or concerns. The clinic phone number is [phone].  Please show the CHEMO ALERT CARD at check-in to the Emergency Department and triage nurse.   

## 2020-03-12 NOTE — Patient Instructions (Signed)

## 2020-03-17 ENCOUNTER — Other Ambulatory Visit: Payer: Self-pay

## 2020-03-17 ENCOUNTER — Inpatient Hospital Stay: Payer: 59

## 2020-03-17 VITALS — BP 156/74 | HR 89 | Temp 98.3°F | Resp 18

## 2020-03-17 DIAGNOSIS — E559 Vitamin D deficiency, unspecified: Secondary | ICD-10-CM

## 2020-03-17 DIAGNOSIS — E1165 Type 2 diabetes mellitus with hyperglycemia: Secondary | ICD-10-CM

## 2020-03-17 DIAGNOSIS — Z170 Estrogen receptor positive status [ER+]: Secondary | ICD-10-CM

## 2020-03-17 DIAGNOSIS — Z95828 Presence of other vascular implants and grafts: Secondary | ICD-10-CM

## 2020-03-17 DIAGNOSIS — C50411 Malignant neoplasm of upper-outer quadrant of right female breast: Secondary | ICD-10-CM | POA: Diagnosis not present

## 2020-03-17 LAB — COMPREHENSIVE METABOLIC PANEL
ALT: 13 U/L (ref 0–44)
AST: 11 U/L — ABNORMAL LOW (ref 15–41)
Albumin: 3.7 g/dL (ref 3.5–5.0)
Alkaline Phosphatase: 67 U/L (ref 38–126)
Anion gap: 5 (ref 5–15)
BUN: 13 mg/dL (ref 8–23)
CO2: 28 mmol/L (ref 22–32)
Calcium: 9.5 mg/dL (ref 8.9–10.3)
Chloride: 104 mmol/L (ref 98–111)
Creatinine, Ser: 1.03 mg/dL — ABNORMAL HIGH (ref 0.44–1.00)
GFR, Estimated: 58 mL/min — ABNORMAL LOW (ref >60)
Glucose, Bld: 192 mg/dL — ABNORMAL HIGH (ref 70–99)
Potassium: 4.2 mmol/L (ref 3.5–5.1)
Sodium: 137 mmol/L (ref 135–145)
Total Bilirubin: 0.6 mg/dL (ref 0.3–1.2)
Total Protein: 6.9 g/dL (ref 6.5–8.1)

## 2020-03-17 LAB — CBC WITH DIFFERENTIAL/PLATELET
Abs Immature Granulocytes: 0.02 10*3/uL (ref 0.00–0.07)
Basophils Absolute: 0 10*3/uL (ref 0.0–0.1)
Basophils Relative: 0 %
Eosinophils Absolute: 0.1 10*3/uL (ref 0.0–0.5)
Eosinophils Relative: 1 %
HCT: 31.4 % — ABNORMAL LOW (ref 36.0–46.0)
Hemoglobin: 10.5 g/dL — ABNORMAL LOW (ref 12.0–15.0)
Immature Granulocytes: 0 %
Lymphocytes Relative: 25 %
Lymphs Abs: 1.9 10*3/uL (ref 0.7–4.0)
MCH: 30.4 pg (ref 26.0–34.0)
MCHC: 33.4 g/dL (ref 30.0–36.0)
MCV: 91 fL (ref 80.0–100.0)
Monocytes Absolute: 0.3 10*3/uL (ref 0.1–1.0)
Monocytes Relative: 3 %
Neutro Abs: 5.5 10*3/uL (ref 1.7–7.7)
Neutrophils Relative %: 71 %
Platelets: 295 10*3/uL (ref 150–400)
RBC: 3.45 MIL/uL — ABNORMAL LOW (ref 3.87–5.11)
RDW: 15.6 % — ABNORMAL HIGH (ref 11.5–15.5)
WBC: 7.8 10*3/uL (ref 4.0–10.5)
nRBC: 0 % (ref 0.0–0.2)

## 2020-03-17 MED ORDER — ACETAMINOPHEN 325 MG PO TABS
650.0000 mg | ORAL_TABLET | Freq: Once | ORAL | Status: AC
  Administered 2020-03-17: 650 mg via ORAL

## 2020-03-17 MED ORDER — SODIUM CHLORIDE 0.9 % IV SOLN
Freq: Once | INTRAVENOUS | Status: AC
  Filled 2020-03-17: qty 250

## 2020-03-17 MED ORDER — PROCHLORPERAZINE MALEATE 10 MG PO TABS
10.0000 mg | ORAL_TABLET | Freq: Once | ORAL | Status: AC
  Administered 2020-03-17: 10 mg via ORAL

## 2020-03-17 MED ORDER — PROCHLORPERAZINE MALEATE 10 MG PO TABS
ORAL_TABLET | ORAL | Status: AC
  Filled 2020-03-17: qty 1

## 2020-03-17 MED ORDER — DIPHENHYDRAMINE HCL 12.5 MG/5ML PO ELIX
12.5000 mg | ORAL_SOLUTION | Freq: Once | ORAL | Status: AC
  Administered 2020-03-17: 12.5 mg via ORAL

## 2020-03-17 MED ORDER — DIPHENHYDRAMINE HCL 12.5 MG/5ML PO ELIX
ORAL_SOLUTION | ORAL | Status: AC
  Filled 2020-03-17: qty 5

## 2020-03-17 MED ORDER — PROCHLORPERAZINE EDISYLATE 10 MG/2ML IJ SOLN: 10.0000 mg | Freq: Once | INTRAMUSCULAR | Status: DC

## 2020-03-17 MED ORDER — TRASTUZUMAB-ANNS CHEMO 150 MG IV SOLR
2.0000 mg/kg | Freq: Once | INTRAVENOUS | Status: AC
  Administered 2020-03-17: 189 mg via INTRAVENOUS
  Filled 2020-03-17: qty 9

## 2020-03-17 MED ORDER — SODIUM CHLORIDE 0.9% FLUSH
10.0000 mL | INTRAVENOUS | Status: DC | PRN
  Administered 2020-03-17: 10 mL
  Filled 2020-03-17: qty 10

## 2020-03-17 MED ORDER — PACLITAXEL PROTEIN-BOUND CHEMO INJECTION 100 MG
100.0000 mg/m2 | Freq: Once | INTRAVENOUS | Status: AC
  Administered 2020-03-17: 200 mg via INTRAVENOUS
  Filled 2020-03-17: qty 40

## 2020-03-17 MED ORDER — HEPARIN SOD (PORK) LOCK FLUSH 100 UNIT/ML IV SOLN
500.0000 [IU] | Freq: Once | INTRAVENOUS | Status: AC | PRN
  Administered 2020-03-17: 500 [IU]
  Filled 2020-03-17: qty 5

## 2020-03-17 MED ORDER — ACETAMINOPHEN 325 MG PO TABS
ORAL_TABLET | ORAL | Status: AC
  Filled 2020-03-17: qty 2

## 2020-03-17 NOTE — Patient Instructions (Signed)
[facility] Discharge Instructions for Patients Receiving Chemotherapy  Today you received the following chemotherapy agents: Trastuzumab and Abraxane  To help prevent nausea and vomiting after your treatment, we encourage you to take your nausea medication  as prescribed.    If you develop nausea and vomiting that is not controlled by your nausea medication, call the clinic.   BELOW ARE SYMPTOMS THAT SHOULD BE REPORTED IMMEDIATELY:  *FEVER GREATER THAN 100.5 F  *CHILLS WITH OR WITHOUT FEVER  NAUSEA AND VOMITING THAT IS NOT CONTROLLED WITH YOUR NAUSEA MEDICATION  *UNUSUAL SHORTNESS OF BREATH  *UNUSUAL BRUISING OR BLEEDING  TENDERNESS IN MOUTH AND THROAT WITH OR WITHOUT PRESENCE OF ULCERS  *URINARY PROBLEMS  *BOWEL PROBLEMS  UNUSUAL RASH Items with * indicate a potential emergency and should be followed up as soon as possible.  Feel free to call the clinic should you have any questions or concerns. The clinic phone number is [phone].  Please show the CHEMO ALERT CARD at check-in to the Emergency Department and triage nurse.   

## 2020-03-20 ENCOUNTER — Telehealth: Payer: Self-pay

## 2020-03-20 ENCOUNTER — Encounter: Payer: Self-pay | Admitting: Oncology

## 2020-03-20 NOTE — Telephone Encounter (Signed)
Pt called stating she is "feeling queasy" and "hurting between my eyes, my nostrils hurt and when I blow my nose there is some blood." Pt denies congestion, cough, sore throat, fever, loss of taste/smell. Pt was advised to take antiemetics as prescribed, as well as Zyrtec, and use aquaphor inside nostrils for moisture/healing, and also get a humidifier. Pt is in agreement with this and states she will call back if sx worsen.

## 2020-03-23 NOTE — Progress Notes (Signed)
[name]  [phone] [phone]     ID: Tracy Garrett DOB: 25-Nov-1955  MR#: [id number]  OAC#:[id number]  Patient Care Team: Unk Pinto, MD as PCP - General (Internal Medicine) [name], RN as Oncology Nurse Navigator [name], RN as Oncology Nurse Navigator Erroll Luna, MD as Consulting Physician (General Surgery) Raffaele Derise, Virgie Dad, MD as Consulting Physician (Oncology) Eppie Gibson, MD as Attending Physician (Radiation Oncology) Mcarthur Rossetti, MD as Consulting Physician (Orthopedic Surgery) Wallene Huh, DPM as Consulting Physician (Podiatry) Larey Dresser, MD as Consulting Physician (Cardiology) Chauncey Cruel, MD OTHER MD:  CHIEF COMPLAINT: triple positive breast cancer (s/p bilateral mastectomies)  CURRENT TREATMENT: Adjuvant chemo immunotherapy   INTERVAL HISTORY: [name] returns today for follow up and treatment of her triple positive breast cancer accompanied by her husband.   She will receive her 8th of 12 planned paclitaxel/trastuzumab treatment today, to be followed by trastuzumab alone to complete 1 year.  Her most recent echocardiogram performed on 12/13/2019 showed an ejection fraction of 55-60%.  She is scheduled for a repeat echocardiogram 04/03/2020   REVIEW OF SYSTEMS: Starlina is tolerating treatment generally well in particular she has had absolutely no peripheral neuropathy symptoms.  She was having some pain in the right hand sometime back but that has completely resolved.  She has felt a little bit queasy and is taking Compazine as needed, usually about once a day.  Her port is working well.  She has no mouth sores.  She does feel fatigued and she is not doing very much at home.  She was not able to work 2 days last week because of the fatigue.  Aside from that a detailed review of systems today was stable   HISTORY OF CURRENT ILLNESS: From the original intake  note:  Tracy Garrett had routine screening mammography on 05/01/2020 showing a possible abnormality in the right breast. She underwent right diagnostic mammography with tomography at [facility] on 05/09/2019 showing: breast density category A; probably-benign regional calcifications in the right breast at 11 o'clock. Short term follow up was recommended.   She returned for follow up at [facility] on 11/13/2019. She underwent right diagnostic mammography and right breast ultrasonography showing: breast composition A; indeterminate calcifications in upper-outer right breast spanning 5 cm; 0.8 cm irregular mass in right breast at 9 o'clock.  Accordingly on 11/20/2019 she proceeded to biopsy of the right breast areas in question. The pathology from this procedure ([id number]) showed:  1. Right breast, UOQ  - foci of invasive and in situ mammary carcinoma, grade 2-3, e-cadherin positive.  2. Right breast, 9 o'clock  - invasive mammary carcinoma, grade 2-3, e-cadherin positive  - Prognostic indicators significant for: estrogen receptor, 95% positive and progesterone receptor, 25% positive, both with strong staining intensity. Proliferation marker Ki67 at 20%. HER2 equivocal by immunohistochemistry (2+), but positive by fluorescent in situ hybridization with a signals ratio 3.30 and number per cell 7.25.  The patient's subsequent history is as detailed below.   PAST MEDICAL HISTORY: Past Medical History:  Diagnosis Date  . Allergy   . Anemia   . Arthritis   . Cancer [facility])    right breast cancer  . Cataracts, bilateral   . Chronic kidney disease    CKD  . Family history of breast cancer   . Family history of multiple myeloma   . Fatty liver disease, nonalcoholic 7/32/2025   [place] appears normal on follow up US 09/2019  .  GERD (gastroesophageal reflux disease)   . History of kidney stones    x2 episodes  . Hyperlipidemia   . Hypertension   . PONV (postoperative nausea and vomiting)   . Sleep apnea     test was dx, as mild- no further tx. required  . Type II or unspecified type diabetes mellitus without mention of complication, not stated as uncontrolled     PAST SURGICAL HISTORY: Past Surgical History:  Procedure Laterality Date  . BREAST SURGERY  1999   Reduction Mammoplasties  . CHOLECYSTECTOMY  2003  . colonoscopsy  11/2007   neg- polyp removal with both procedures x2  . CYSTOSCOPY     surgery to extract stone x 1 with stent  . EUS N/A 07/26/2014   Procedure: UPPER ENDOSCOPIC ULTRASOUND (EUS) LINEAR;  Surgeon: Beryle Beams, MD;  Location: [facility] ENDOSCOPY;  Service: Endoscopy;  Laterality: N/A;  . 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 LYSIS OF ADHESION  10/10/2014   Procedure: LYSIS OF ADHESION;  Surgeon: TJackolyn Confer MD;  Location: [facility] ORS;  Service: General;;  . MASTECTOMY W/ SENTINEL NODE BIOPSY Bilateral 01/02/2020   Procedure: BILATERAL MASTECTOMY WITH RIGHT SENTINEL LYMPH NODE MAPPING;  Surgeon: CErroll Luna MD;  [place] [facility]  Service: General;  Laterality: Bilateral;  PEC BLOCK  . PORTACATH PLACEMENT Right 01/02/2020   Procedure: INSERTION PORT-A-CATH WITH ULTRASOUND GUIDANCE;  Surgeon: CErroll Luna MD;  [place]  Service: General;  Laterality: Right;  . TONSILLECTOMY     and adenoids removed age [age] . VAGINAL HYSTERECTOMY  2002    FAMILY HISTORY: Family History  Problem Relation Age of Onset  . Breast cancer Mother 714 . Heart disease Father        smoker  . Diabetes type I Half-Sister   . Alzheimer's disease Maternal Grandmother   . Emphysema Paternal Grandfather        smoker  . Heart attack Paternal Grandfather   . Lung cancer Paternal Grandfather   . CAD Brother 522      stents  . Breast cancer Maternal Aunt        dx. in her late 771s . Breast cancer Cousin 526 . Cancer Maternal Uncle         dx. in his 632s tumor on the back of neck  . Breast cancer Paternal Aunt        dx. >50  . Multiple myeloma Maternal Aunt 92  . Multiple myeloma Paternal Aunt        dx. >50  . Breast cancer Cousin 549  Her father died at age 19538 Her mother died at age 523 She has 2 brothers and had 1 sister who is deceased. She reports breast cancer on her maternal side-- her mother at age 536 an aunt, and a cousin.   GYNECOLOGIC HISTORY:  No LMP recorded. Patient has had a hysterectomy. Menarche: 64years old Age at first live birth: 64years old [place] 2 LMP unsure Contraceptive: never used HRT never used  Hysterectomy? Yes, 2002 BSO? no   SOCIAL HISTORY: (updated 11/2019)  MNainais currently working for [employer] Husband JDenyse Amassworks for [employer], a lPassenger transport manager       . Son AKatherine Basset age 64 is self-employed in [place]  age 5, lives here in [place].     ADVANCED DIRECTIVES: In the absence of any documentation to the contrary, the patient's spouse is their HCPOA.    HEALTH MAINTENANCE: Social History   Tobacco Use  . Smoking status: Never Smoker  . Smokeless tobacco: Never Used  Vaping Use  . Vaping Use: Never used  Substance Use Topics  . Alcohol use: No  . Drug use: No     Colonoscopy: 02/2019 (Dr. Collene Mares), repeat due 2025  PAP: none on file (s/p hysterectomy)  Bone density: never done   Allergies  Allergen Reactions  . Hydromorphone Rash    Arm reddened at IV med site  . Other Itching and Rash    NUTS CAUSE MOUTH TO [facility]    Current Outpatient Medications  Medication Sig Dispense Refill  . ALPRAZolam (XANAX) 1 MG tablet Take 1 tablet 1 hour prior to procedure.  Then take 1 tablet right before procedure if needed (Patient not taking: Reported on 12/24/2019) 2 tablet 0  . aspirin EC 81 MG tablet Take 81 mg by mouth daily. Swallow whole.    . cetirizine (ZYRTEC) 10 MG tablet Take 10 mg by mouth daily.    .  Cholecalciferol (VITAMIN D3) 5000 UNITS TABS Take 5,000 Units by mouth daily.     Marland Kitchen glucose blood (CONTOUR NEXT TEST) test strip Check blood sugar 1 time daily-DX-E11.9 100 each 12  . ibuprofen (ADVIL) 800 MG tablet Take 1 tablet (800 mg total) by mouth every 8 (eight) hours as needed. 30 tablet 0  . lidocaine-prilocaine (EMLA) cream Apply to affected area once 30 g 3  . losartan (COZAAR) 50 MG tablet TAKE 1/2-1 TAB DAILY FOR BLOOD PRESSURE AND TO PROTECT KIDNEYS. 30 tablet 5  . metFORMIN (GLUCOPHAGE XR) 500 MG 24 hr tablet Take 1 tablet (500 mg total) by mouth daily with breakfast. 30 tablet 11  . Omega-3 Fatty Acids (OMEGA 3 PO) Take 1 capsule by mouth 2 (two) times daily. (Patient not taking: Reported on 12/24/2019)    . OVER THE COUNTER MEDICATION Takes CoQ10 1 capsule daily (Patient not taking: Reported on 12/24/2019)    . oxyCODONE (OXY IR/ROXICODONE) 5 MG immediate release tablet Take 1 tablet (5 mg total) by mouth every 6 (six) hours as needed for severe pain. 15 tablet 0  . pantoprazole (PROTONIX) 40 MG tablet Take 40 mg by mouth daily.     . prochlorperazine (COMPAZINE) 10 MG tablet Take 1 tablet (10 mg total) by mouth every 6 (six) hours as needed (Nausea or vomiting). 30 tablet 1  . rosuvastatin (CRESTOR) 5 MG tablet Take 1 tablet daily for Cholesterol (Patient taking differently: Take 5 mg by mouth daily. ) 90 tablet 1  . sulfamethoxazole-trimethoprim (BACTRIM DS) 800-160 MG tablet Take 1 tablet by mouth 2 (two) times daily. (Patient not taking: Reported on 12/24/2019) 10 tablet 0   No current facility-administered medications for this visit.    OBJECTIVE: African-American woman in no acute distress  Vitals:   03/24/20 0833  BP: (!) 146/81  Pulse: 90  Resp: 17  Temp: 97.6 F (36.4 C)  SpO2: 100%     Body mass index is 40.24 kg/m.   Wt Readings from Last 3 Encounters:  03/24/20 220 lb (99.8 kg)  03/12/20 221 lb 8 oz (100.5 kg)  03/03/20 217 lb 4 oz (98.5 kg)     ECOG FS:1 -  Symptomatic but completely ambulatory  Sclerae unicteric, EOMs intact Wearing a mask No cervical or  supraclavicular adenopathy Lungs no rales or rhonchi Heart regular rate and rhythm Abd soft,n obese, nontender, positive bowel sounds MSK no focal spinal tenderness, no upper extremity lymphedema Neuro: nonfocal, well oriented, appropriate affect Breasts: Deferred    LAB RESULTS:  CMP     Component Value Date/Time   NA 137 03/17/2020 0852   K 4.2 03/17/2020 0852   CL 104 03/17/2020 0852   CO2 28 03/17/2020 0852   GLUCOSE 192 (H) 03/17/2020 0852   BUN 13 03/17/2020 0852   CREATININE 1.03 (H) 03/17/2020 0852   CREATININE 0.94 11/28/2019 1217   CREATININE 0.82 11/26/2019 1533   CALCIUM 9.5 03/17/2020 0852   PROT 6.9 03/17/2020 0852   ALBUMIN 3.7 03/17/2020 0852   AST 11 (L) 03/17/2020 0852   AST 10 (L) 11/28/2019 1217   ALT 13 03/17/2020 0852   ALT 16 11/28/2019 1217   ALKPHOS 67 03/17/2020 0852   BILITOT 0.6 03/17/2020 0852   BILITOT 0.6 11/28/2019 1217   GFRNONAA 58 (L) 03/17/2020 0852   GFRNONAA >60 11/28/2019 1217   GFRNONAA 76 11/26/2019 1533   GFRAA >60 03/03/2020 1343   GFRAA >60 11/28/2019 1217   GFRAA 88 11/26/2019 1533    Lab Results  Component Value Date   ALBUMINELP 3.9 08/23/2019   A1GS 0.4 (H) 08/23/2019   A2GS 0.9 08/23/2019   BETS 0.5 08/23/2019   BETA2SER 0.4 08/23/2019   GAMS 0.9 08/23/2019   SPEI  08/23/2019     Comment:     . Alpha-1 globulin increase noted. .     Lab Results  Component Value Date   WBC 9.1 03/24/2020   NEUTROABS 4.5 03/24/2020   HGB 10.2 (L) 03/24/2020   HCT 30.4 (L) 03/24/2020   MCV 91.3 03/24/2020   PLT 319 03/24/2020    No results found for: LABCA2  No components found for: MOQHUT654  No results for input(s): INR in the last 168 hours.  No results found for: LABCA2  No results found for: YTK354  No results found for: SFK812  No results found for: XNT700  No results found for: CA2729  No  components found for: HGQUANT  No results found for: CEA1 / No results found for: CEA1   No results found for: AFPTUMOR  No results found for: CHROMOGRNA  No results found for: KPAFRELGTCHN, LAMBDASER, KAPLAMBRATIO (kappa/lambda light chains)  No results found for: HGBA, HGBA2QUANT, HGBFQUANT, HGBSQUAN (Hemoglobinopathy evaluation)   No results found for: LDH  Lab Results  Component Value Date   IRON 54 11/26/2019   TIBC 378 11/26/2019   IRONPCTSAT 14 (L) 11/26/2019   (Iron and TIBC)  Lab Results  Component Value Date   FERRITIN 91 05/21/2015    Urinalysis    Component Value Date/Time   COLORURINE YELLOW 11/26/2019 1533   APPEARANCEUR CLOUDY (A) 11/26/2019 1533   LABSPEC 1.019 11/26/2019 1533   PHURINE < OR = 5.0 11/26/2019 1533   GLUCOSEU 1+ (A) 11/26/2019 1533   HGBUR NEGATIVE 11/26/2019 1533   BILIRUBINUR NEG 11/22/2013 1031   BILIRUBINUR neg 10/28/2013 1159   KETONESUR NEGATIVE 11/26/2019 1533   PROTEINUR 2+ (A) 11/26/2019 1533   UROBILINOGEN 0.2 11/22/2013 1031   NITRITE NEGATIVE 08/13/2019 1359   LEUKOCYTESUR 1+ (A) 08/13/2019 1359    STUDIES: No results found.   ELIGIBLE FOR AVAILABLE RESEARCH PROTOCOL: AET  ASSESSMENT: 64 y.o. [place] woman status post right breast upper outer quadrant biopsy 11/20/2019 for a clinical mT1b N0, stage IA invasive ductal carcinoma, grade 2  or 3, estrogen and progesterone receptor positive, HER-2 amplified, with an MIB-1 of 20%  (1) status post bilateral mastectomies 01/02/2020 showing  (a) on the right, an mpT2 pN0(i+), stage IB invasive ductal carcinoma, triple positive, with close but negative margins; 2 sentinel lymph nodes removed  (b) on the left, no malignancy identified  (c) the patient is not planning on reconstruction  (2) adjuvant chemo immunotherapy will consist of Abraxane weekly x12 with trastuzumab  (a) echo 12/13/2019 shows an ejection fraction in the 55-60% range  (3) trastuzumab to be continued  to total 1 year  (4) adjuvant radiation  (5) genetics testing 12/16/2019 through the [facility] Breast Cancer STAT panel + Common Hereditary Cancers panel found no deleterious mutations in ATM, BRCA1, BRCA2, CDH1, CHEK2, PALB2, PTEN, STK11 and TP53. The Common Hereditary Cancers Panel offered by [facility] includes sequencing and/or deletion duplication testing of the following 48 genes: APC, ATM, AXIN2, BARD1, BMPR1A, BRCA1, BRCA2, BRIP1, CDH1, CDK4, CDKN2A (p14ARF), CDKN2A (p16INK4a), CHEK2, CTNNA1, DICER1, EPCAM (Deletion/duplication testing only), GREM1 (promoter region deletion/duplication testing only), KIT, MEN1, MLH1, MSH2, MSH3, MSH6, MUTYH, NBN, NF1, NTHL1, PALB2, PDGFRA, PMS2, POLD1, POLE, PTEN, RAD50, RAD51C, RAD51D, RNF43, SDHB, SDHC, SDHD, SMAD4, SMARCA4. STK11, TP53, TSC1, TSC2, and VHL.  The following genes were evaluated for sequence changes only: SDHA and HOXB13 c.251G>A variant only.  (a) Two variants of uncertain significance (VUS) were detected - one in the BRIP1 gene called c.1735C>T and a second in the POLD1 gene called c.2861C>G.   (6) antiestrogens to follow-up   PLAN: Ronella continues to tolerate her treatment well and in particular there is no evidence of peripheral neuropathy.  We are proceeding with the eighth of 12 planned weekly paclitaxel treatments today.  I reviewed her treatment plan with her so she remembers she will continue the trastuzumab to complete a year and she will need radiation after she completes her Taxol treatments.  I have encouraged her to exercise a little bit since the radiation treatments also will make her tired.  She will not be seen next week during her treatment but if she has any symptoms on particularly in the peripheral neuropathy she will ask for me to stop by the treatment area before she gets treated.  Otherwise I will see her in 2 weeks  Total encounter time 25 minutes*   Cinthia Rodden C. Lakeeta Dobosz, MD 03/24/2020 8:44 AM Medical Oncology and  Hematology [facility] [place], [place] [address] Tel. [phone]    Fax. [phone]   This document serves as a record of services personally performed by Lurline Del, MD. It was created on his behalf by Wilburn Mylar, a trained medical scribe. The creation of this record is based on the scribe's personal observations and the provider's statements to them.   I, Lurline Del MD, have reviewed the above documentation for accuracy and completeness, and I agree with the above.   *Total Encounter Time as defined by the Centers for Medicare and Medicaid Services includes, in addition to the face-to-face time of a patient visit (documented in the note above) non-face-to-face time: obtaining and reviewing outside history, ordering and reviewing medications, tests or procedures, care coordination (communications with other health care professionals or caregivers) and documentation in the medical record.

## 2020-03-24 ENCOUNTER — Inpatient Hospital Stay: Payer: 59

## 2020-03-24 ENCOUNTER — Inpatient Hospital Stay (HOSPITAL_BASED_OUTPATIENT_CLINIC_OR_DEPARTMENT_OTHER): Payer: 59 | Admitting: Oncology

## 2020-03-24 ENCOUNTER — Other Ambulatory Visit: Payer: Self-pay

## 2020-03-24 ENCOUNTER — Encounter: Payer: Self-pay | Admitting: *Deleted

## 2020-03-24 VITALS — BP 146/81 | HR 90 | Temp 97.6°F | Resp 17 | Ht 62.0 in | Wt 220.0 lb

## 2020-03-24 DIAGNOSIS — Z170 Estrogen receptor positive status [ER+]: Secondary | ICD-10-CM

## 2020-03-24 DIAGNOSIS — C50411 Malignant neoplasm of upper-outer quadrant of right female breast: Secondary | ICD-10-CM | POA: Diagnosis not present

## 2020-03-24 DIAGNOSIS — E1165 Type 2 diabetes mellitus with hyperglycemia: Secondary | ICD-10-CM

## 2020-03-24 DIAGNOSIS — E559 Vitamin D deficiency, unspecified: Secondary | ICD-10-CM

## 2020-03-24 DIAGNOSIS — Z95828 Presence of other vascular implants and grafts: Secondary | ICD-10-CM

## 2020-03-24 LAB — CBC WITH DIFFERENTIAL/PLATELET
Abs Immature Granulocytes: 0.23 10*3/uL — ABNORMAL HIGH (ref 0.00–0.07)
Basophils Absolute: 0.1 10*3/uL (ref 0.0–0.1)
Basophils Relative: 1 %
Eosinophils Absolute: 0.2 10*3/uL (ref 0.0–0.5)
Eosinophils Relative: 2 %
HCT: 30.4 % — ABNORMAL LOW (ref 36.0–46.0)
Hemoglobin: 10.2 g/dL — ABNORMAL LOW (ref 12.0–15.0)
Immature Granulocytes: 3 %
Lymphocytes Relative: 39 %
Lymphs Abs: 3.5 10*3/uL (ref 0.7–4.0)
MCH: 30.6 pg (ref 26.0–34.0)
MCHC: 33.6 g/dL (ref 30.0–36.0)
MCV: 91.3 fL (ref 80.0–100.0)
Monocytes Absolute: 0.6 10*3/uL (ref 0.1–1.0)
Monocytes Relative: 7 %
Neutro Abs: 4.5 10*3/uL (ref 1.7–7.7)
Neutrophils Relative %: 48 %
Platelets: 319 10*3/uL (ref 150–400)
RBC: 3.33 MIL/uL — ABNORMAL LOW (ref 3.87–5.11)
RDW: 15.6 % — ABNORMAL HIGH (ref 11.5–15.5)
WBC: 9.1 10*3/uL (ref 4.0–10.5)
nRBC: 0.5 % — ABNORMAL HIGH (ref 0.0–0.2)

## 2020-03-24 LAB — COMPREHENSIVE METABOLIC PANEL
ALT: 11 U/L (ref 0–44)
AST: 14 U/L — ABNORMAL LOW (ref 15–41)
Albumin: 3.7 g/dL (ref 3.5–5.0)
Alkaline Phosphatase: 69 U/L (ref 38–126)
Anion gap: 6 (ref 5–15)
BUN: 13 mg/dL (ref 8–23)
CO2: 29 mmol/L (ref 22–32)
Calcium: 9.1 mg/dL (ref 8.9–10.3)
Chloride: 103 mmol/L (ref 98–111)
Creatinine, Ser: 1.05 mg/dL — ABNORMAL HIGH (ref 0.44–1.00)
GFR, Estimated: 56 mL/min — ABNORMAL LOW (ref >60)
Glucose, Bld: 188 mg/dL — ABNORMAL HIGH (ref 70–99)
Potassium: 3.9 mmol/L (ref 3.5–5.1)
Sodium: 138 mmol/L (ref 135–145)
Total Bilirubin: 0.3 mg/dL (ref 0.3–1.2)
Total Protein: 6.9 g/dL (ref 6.5–8.1)

## 2020-03-24 MED ORDER — PROCHLORPERAZINE MALEATE 10 MG PO TABS
10.0000 mg | ORAL_TABLET | Freq: Once | ORAL | Status: AC
  Administered 2020-03-24: 10 mg via ORAL

## 2020-03-24 MED ORDER — PROCHLORPERAZINE EDISYLATE 10 MG/2ML IJ SOLN: 10.0000 mg | Freq: Once | INTRAMUSCULAR | Status: DC

## 2020-03-24 MED ORDER — PROCHLORPERAZINE EDISYLATE 10 MG/2ML IJ SOLN
INTRAMUSCULAR | Status: AC
  Filled 2020-03-24: qty 2

## 2020-03-24 MED ORDER — ACETAMINOPHEN 325 MG PO TABS
650.0000 mg | ORAL_TABLET | Freq: Once | ORAL | Status: AC
  Administered 2020-03-24: 650 mg via ORAL

## 2020-03-24 MED ORDER — PACLITAXEL PROTEIN-BOUND CHEMO INJECTION 100 MG
100.0000 mg/m2 | Freq: Once | INTRAVENOUS | Status: AC
  Administered 2020-03-24: 200 mg via INTRAVENOUS
  Filled 2020-03-24: qty 40

## 2020-03-24 MED ORDER — DIPHENHYDRAMINE HCL 12.5 MG/5ML PO ELIX
ORAL_SOLUTION | ORAL | Status: AC
  Filled 2020-03-24: qty 5

## 2020-03-24 MED ORDER — TRASTUZUMAB-ANNS CHEMO 150 MG IV SOLR
2.0000 mg/kg | Freq: Once | INTRAVENOUS | Status: AC
  Administered 2020-03-24: 189 mg via INTRAVENOUS
  Filled 2020-03-24: qty 9

## 2020-03-24 MED ORDER — SODIUM CHLORIDE 0.9% FLUSH
10.0000 mL | INTRAVENOUS | Status: DC | PRN
  Administered 2020-03-24: 10 mL
  Filled 2020-03-24: qty 10

## 2020-03-24 MED ORDER — DIPHENHYDRAMINE HCL 12.5 MG/5ML PO ELIX
12.5000 mg | ORAL_SOLUTION | Freq: Once | ORAL | Status: AC
  Administered 2020-03-24: 12.5 mg via ORAL

## 2020-03-24 MED ORDER — PROCHLORPERAZINE MALEATE 10 MG PO TABS
ORAL_TABLET | ORAL | Status: AC
  Filled 2020-03-24: qty 1

## 2020-03-24 MED ORDER — ACETAMINOPHEN 325 MG PO TABS
ORAL_TABLET | ORAL | Status: AC
  Filled 2020-03-24: qty 2

## 2020-03-24 MED ORDER — SODIUM CHLORIDE 0.9 % IV SOLN
Freq: Once | INTRAVENOUS | Status: AC
  Filled 2020-03-24: qty 250

## 2020-03-24 MED ORDER — HEPARIN SOD (PORK) LOCK FLUSH 100 UNIT/ML IV SOLN
500.0000 [IU] | Freq: Once | INTRAVENOUS | Status: AC | PRN
  Administered 2020-03-24: 500 [IU]
  Filled 2020-03-24: qty 5

## 2020-03-24 NOTE — Patient Instructions (Signed)
[place] Discharge Instructions for Patients Receiving Chemotherapy  Today you received the following chemotherapy agents: Kanjinti, Abraxane  To help prevent nausea and vomiting after your treatment, we encourage you to take your nausea medication as directed.    If you develop nausea and vomiting that is not controlled by your nausea medication, call the clinic.   BELOW ARE SYMPTOMS THAT SHOULD BE REPORTED IMMEDIATELY:  *FEVER GREATER THAN 100.5 F  *CHILLS WITH OR WITHOUT FEVER  NAUSEA AND VOMITING THAT IS NOT CONTROLLED WITH YOUR NAUSEA MEDICATION  *UNUSUAL SHORTNESS OF BREATH  *UNUSUAL BRUISING OR BLEEDING  TENDERNESS IN MOUTH AND THROAT WITH OR WITHOUT PRESENCE OF ULCERS  *URINARY PROBLEMS  *BOWEL PROBLEMS  UNUSUAL RASH Items with * indicate a potential emergency and should be followed up as soon as possible.  Feel free to call the clinic should you have any questions or concerns. The clinic phone number is [phone].  Please show the [place] at check-in to the Emergency Department and triage nurse.

## 2020-03-26 ENCOUNTER — Telehealth: Payer: Self-pay | Admitting: Oncology

## 2020-03-26 NOTE — Telephone Encounter (Signed)
Scheduled per 10/18 los. Pt will receive an updated appt calendar, per next visit appt notes

## 2020-03-28 LAB — HM DIABETES EYE EXAM

## 2020-03-31 ENCOUNTER — Inpatient Hospital Stay: Payer: 59

## 2020-03-31 ENCOUNTER — Other Ambulatory Visit: Payer: Self-pay

## 2020-03-31 VITALS — BP 131/85 | HR 89 | Temp 98.7°F | Resp 16

## 2020-03-31 DIAGNOSIS — C50411 Malignant neoplasm of upper-outer quadrant of right female breast: Secondary | ICD-10-CM | POA: Diagnosis not present

## 2020-03-31 DIAGNOSIS — Z170 Estrogen receptor positive status [ER+]: Secondary | ICD-10-CM

## 2020-03-31 DIAGNOSIS — E1165 Type 2 diabetes mellitus with hyperglycemia: Secondary | ICD-10-CM

## 2020-03-31 DIAGNOSIS — E559 Vitamin D deficiency, unspecified: Secondary | ICD-10-CM

## 2020-03-31 DIAGNOSIS — Z95828 Presence of other vascular implants and grafts: Secondary | ICD-10-CM

## 2020-03-31 LAB — COMPREHENSIVE METABOLIC PANEL
ALT: 14 U/L (ref 0–44)
AST: 12 U/L — ABNORMAL LOW (ref 15–41)
Albumin: 3.9 g/dL (ref 3.5–5.0)
Alkaline Phosphatase: 68 U/L (ref 38–126)
Anion gap: 7 (ref 5–15)
BUN: 14 mg/dL (ref 8–23)
CO2: 27 mmol/L (ref 22–32)
Calcium: 9.2 mg/dL (ref 8.9–10.3)
Chloride: 101 mmol/L (ref 98–111)
Creatinine, Ser: 1.05 mg/dL — ABNORMAL HIGH (ref 0.44–1.00)
GFR, Estimated: 60 mL/min — ABNORMAL LOW (ref >60)
Glucose, Bld: 207 mg/dL — ABNORMAL HIGH (ref 70–99)
Potassium: 4.1 mmol/L (ref 3.5–5.1)
Sodium: 135 mmol/L (ref 135–145)
Total Bilirubin: 0.4 mg/dL (ref 0.3–1.2)
Total Protein: 6.9 g/dL (ref 6.5–8.1)

## 2020-03-31 LAB — CBC WITH DIFFERENTIAL/PLATELET
Abs Immature Granulocytes: 0.08 10*3/uL — ABNORMAL HIGH (ref 0.00–0.07)
Basophils Absolute: 0.1 10*3/uL (ref 0.0–0.1)
Basophils Relative: 1 %
Eosinophils Absolute: 0.1 10*3/uL (ref 0.0–0.5)
Eosinophils Relative: 1 %
HCT: 30.8 % — ABNORMAL LOW (ref 36.0–46.0)
Hemoglobin: 10.3 g/dL — ABNORMAL LOW (ref 12.0–15.0)
Immature Granulocytes: 1 %
Lymphocytes Relative: 37 %
Lymphs Abs: 2.9 10*3/uL (ref 0.7–4.0)
MCH: 30.8 pg (ref 26.0–34.0)
MCHC: 33.4 g/dL (ref 30.0–36.0)
MCV: 92.2 fL (ref 80.0–100.0)
Monocytes Absolute: 0.7 10*3/uL (ref 0.1–1.0)
Monocytes Relative: 8 %
Neutro Abs: 4.1 10*3/uL (ref 1.7–7.7)
Neutrophils Relative %: 52 %
Platelets: 297 10*3/uL (ref 150–400)
RBC: 3.34 MIL/uL — ABNORMAL LOW (ref 3.87–5.11)
RDW: 16.4 % — ABNORMAL HIGH (ref 11.5–15.5)
WBC: 7.9 10*3/uL (ref 4.0–10.5)
nRBC: 0.5 % — ABNORMAL HIGH (ref 0.0–0.2)

## 2020-03-31 MED ORDER — TRASTUZUMAB-ANNS CHEMO 150 MG IV SOLR
2.0000 mg/kg | Freq: Once | INTRAVENOUS | Status: AC
  Administered 2020-03-31: 189 mg via INTRAVENOUS
  Filled 2020-03-31: qty 9

## 2020-03-31 MED ORDER — SODIUM CHLORIDE 0.9 % IV SOLN
Freq: Once | INTRAVENOUS | Status: AC
  Filled 2020-03-31: qty 250

## 2020-03-31 MED ORDER — SODIUM CHLORIDE 0.9% FLUSH
10.0000 mL | INTRAVENOUS | Status: DC | PRN
  Administered 2020-03-31: 10 mL
  Filled 2020-03-31: qty 10

## 2020-03-31 MED ORDER — PROCHLORPERAZINE MALEATE 10 MG PO TABS
ORAL_TABLET | ORAL | Status: AC
  Filled 2020-03-31: qty 1

## 2020-03-31 MED ORDER — HEPARIN SOD (PORK) LOCK FLUSH 100 UNIT/ML IV SOLN
500.0000 [IU] | Freq: Once | INTRAVENOUS | Status: AC | PRN
  Administered 2020-03-31: 500 [IU]
  Filled 2020-03-31: qty 5

## 2020-03-31 MED ORDER — DIPHENHYDRAMINE HCL 12.5 MG/5ML PO ELIX
12.5000 mg | ORAL_SOLUTION | Freq: Once | ORAL | Status: AC
  Administered 2020-03-31: 12.5 mg via ORAL

## 2020-03-31 MED ORDER — ACETAMINOPHEN 325 MG PO TABS
650.0000 mg | ORAL_TABLET | Freq: Once | ORAL | Status: AC
  Administered 2020-03-31: 650 mg via ORAL

## 2020-03-31 MED ORDER — DIPHENHYDRAMINE HCL 12.5 MG/5ML PO ELIX
ORAL_SOLUTION | ORAL | Status: AC
  Filled 2020-03-31: qty 5

## 2020-03-31 MED ORDER — PACLITAXEL PROTEIN-BOUND CHEMO INJECTION 100 MG
100.0000 mg/m2 | Freq: Once | INTRAVENOUS | Status: AC
  Administered 2020-03-31: 200 mg via INTRAVENOUS
  Filled 2020-03-31: qty 40

## 2020-03-31 MED ORDER — PROCHLORPERAZINE MALEATE 10 MG PO TABS
10.0000 mg | ORAL_TABLET | Freq: Once | ORAL | Status: AC
  Administered 2020-03-31: 10 mg via ORAL

## 2020-03-31 MED ORDER — ACETAMINOPHEN 325 MG PO TABS
ORAL_TABLET | ORAL | Status: AC
  Filled 2020-03-31: qty 2

## 2020-03-31 NOTE — Progress Notes (Signed)
Pt A&O & ambulatory at discharge.   

## 2020-03-31 NOTE — Patient Instructions (Signed)
[place] Discharge Instructions for Patients Receiving Chemotherapy  Today you received the following chemotherapy agents: Trastuzumab and Abraxane  To help prevent nausea and vomiting after your treatment, we encourage you to take your nausea medication  as prescribed.    If you develop nausea and vomiting that is not controlled by your nausea medication, call the clinic.   BELOW ARE SYMPTOMS THAT SHOULD BE REPORTED IMMEDIATELY:  *FEVER GREATER THAN 100.5 F  *CHILLS WITH OR WITHOUT FEVER  NAUSEA AND VOMITING THAT IS NOT CONTROLLED WITH YOUR NAUSEA MEDICATION  *UNUSUAL SHORTNESS OF BREATH  *UNUSUAL BRUISING OR BLEEDING  TENDERNESS IN MOUTH AND THROAT WITH OR WITHOUT PRESENCE OF ULCERS  *URINARY PROBLEMS  *BOWEL PROBLEMS  UNUSUAL RASH Items with * indicate a potential emergency and should be followed up as soon as possible.  Feel free to call the clinic should you have any questions or concerns. The clinic phone number is [phone].  Please show the [place] at check-in to the Emergency Department and triage nurse.

## 2020-04-03 ENCOUNTER — Other Ambulatory Visit: Payer: Self-pay

## 2020-04-03 ENCOUNTER — Ambulatory Visit (HOSPITAL_COMMUNITY): Payer: 59 | Attending: Cardiovascular Disease

## 2020-04-03 DIAGNOSIS — Z0189 Encounter for other specified special examinations: Secondary | ICD-10-CM | POA: Diagnosis not present

## 2020-04-03 DIAGNOSIS — Z170 Estrogen receptor positive status [ER+]: Secondary | ICD-10-CM | POA: Insufficient documentation

## 2020-04-03 DIAGNOSIS — C50411 Malignant neoplasm of upper-outer quadrant of right female breast: Secondary | ICD-10-CM | POA: Diagnosis not present

## 2020-04-03 LAB — ECHOCARDIOGRAM COMPLETE
Area-P 1/2: 4.68 cm2
P 1/2 time: 368 msec
S' Lateral: 3.3 cm

## 2020-04-04 NOTE — Progress Notes (Signed)
[place]  [phone] [phone]     ID: Tracy Garrett DOB: Nov 04, 1955  MR#: [id number]  JYN#:[id number]  Patient Care Team: Tracy Pinto, MD as PCP - General (Internal Medicine) Tracy Kaufmann, RN as Oncology Nurse Navigator Tracy Germany, RN as Oncology Nurse Navigator Tracy Luna, MD as Consulting Physician (General Surgery) Tracy Garrett, Tracy Dad, MD as Consulting Physician (Oncology) Tracy Gibson, MD as Attending Physician (Radiation Oncology) Tracy Rossetti, MD as Consulting Physician (Orthopedic Surgery) Tracy Garrett, DPM as Consulting Physician (Podiatry) Tracy Dresser, MD as Consulting Physician (Cardiology) Tracy Cruel, MD OTHER MD:  CHIEF COMPLAINT: triple positive breast cancer (s/p bilateral mastectomies)  CURRENT TREATMENT: Adjuvant chemo immunotherapy   INTERVAL HISTORY: Tracy Garrett returns today for follow up and treatment of her triple positive breast cancer accompanied by her husband.   She will receive her 10th of 12 planned paclitaxel/trastuzumab treatment today, to be followed by trastuzumab alone every 3 weeks to complete 1 year.  Since her last visit, she underwent repeat echocardiogram on 04/03/2020 showing an ejection fraction of 55-60%. This is stable from prior in 12/2019.   REVIEW OF SYSTEMS: Tracy Garrett continues to do well with her treatments.  She is having some hyperpigmentation, which is actually fairly mild.  She is having some nail changes.  She is not having any peripheral neuropathy symptoms.  She had a little bit of swelling in her fingers and a little bit of discomfort in her PIP joints, which I do not think is related to her treatment here.  She has had minimal diarrhea.  She is not taking anything for that.  Her port continues to work well.  A detailed review of systems today was otherwise stable.  COVID 19 VACCINATION STATUS: Receive [place] x2, most recent dose March  2021   HISTORY OF CURRENT ILLNESS: From the original intake note:  Tracy Garrett had routine screening mammography on 05/01/2020 showing a possible abnormality in the right breast. She underwent right diagnostic mammography with tomography at [facility] on 05/09/2019 showing: breast density category A; probably-benign regional calcifications in the right breast at 11 o'clock. Short term follow up was recommended.   She returned for follow up at [facility] on 11/13/2019. She underwent right diagnostic mammography and right breast ultrasonography showing: breast composition A; indeterminate calcifications in upper-outer right breast spanning 5 cm; 0.8 cm irregular mass in right breast at 9 o'clock.  Accordingly on 11/20/2019 she proceeded to biopsy of the right breast areas in question. The pathology from this procedure ([id number]) showed:  1. Right breast, UOQ  - foci of invasive and in situ mammary carcinoma, grade 2-3, e-cadherin positive.  2. Right breast, 9 o'clock  - invasive mammary carcinoma, grade 2-3, e-cadherin positive  - Prognostic indicators significant for: estrogen receptor, 95% positive and progesterone receptor, 25% positive, both with strong staining intensity. Proliferation marker Ki67 at 20%. HER2 equivocal by immunohistochemistry (2+), but positive by fluorescent in situ hybridization with a signals ratio 3.30 and number per cell 7.25.  The patient's subsequent history is as detailed below.   PAST MEDICAL HISTORY: Past Medical History:  Diagnosis Date  . Allergy   . Anemia   . Arthritis   . Cancer [facility])    right breast cancer  . Cataracts, bilateral   . Chronic kidney disease    CKD  . Family history of breast cancer   . Family history of multiple myeloma   . Fatty liver disease, nonalcoholic 2/95/2841  [place] appears normal on follow up US 09/2019  . GERD (gastroesophageal reflux disease)   . History of kidney stones    x2 episodes  . Hyperlipidemia   . Hypertension    . PONV (postoperative nausea and vomiting)   . Sleep apnea    test was dx, as mild- no further tx. required  . Type II or unspecified type diabetes mellitus without mention of complication, not stated as uncontrolled     PAST SURGICAL HISTORY: Past Surgical History:  Procedure Laterality Date  . BREAST SURGERY  1999   Reduction Mammoplasties  . CHOLECYSTECTOMY  2003  . colonoscopsy  11/2007   neg- polyp removal with both procedures x2  . CYSTOSCOPY     surgery to extract stone x 1 with stent  . EUS N/A 07/26/2014   Procedure: UPPER ENDOSCOPIC ULTRASOUND (EUS) LINEAR;  Surgeon: Tracy Beams, MD;  Location: [facility];  Service: Endoscopy;  Laterality: N/A;  . 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 LYSIS OF ADHESION  10/10/2014   Procedure: LYSIS OF ADHESION;  Surgeon: Tracy Confer MD;  Location: [facility] ORS;  Service: General;;  . MASTECTOMY W/ SENTINEL NODE BIOPSY Bilateral 01/02/2020   Procedure: BILATERAL MASTECTOMY WITH RIGHT SENTINEL LYMPH NODE MAPPING;  Surgeon: CErroll Luna MD;  [facility]  Service: General;  Laterality: Bilateral;  PEC BLOCK  . PORTACATH PLACEMENT Right 01/02/2020   Procedure: INSERTION PORT-A-CATH WITH ULTRASOUND GUIDANCE;  Surgeon: CErroll Luna MD;  [place] [facility]  Service: General;  Laterality: Right;  . TONSILLECTOMY     and adenoids removed age 64 . VAGINAL HYSTERECTOMY  2002    FAMILY HISTORY: Family History  Problem Relation Age of Onset  . Breast cancer Mother 743 . Heart disease Father        smoker  . Diabetes type I Half-Sister   . Alzheimer's disease Maternal Grandmother   . Emphysema Paternal Grandfather        smoker  . Heart attack Paternal Grandfather   . Lung cancer Paternal Grandfather   . CAD Brother 555      stents  . Breast cancer Maternal Aunt        dx. in her  late 767s . Breast cancer Cousin 514 . Cancer Maternal Uncle        dx. in his 625s tumor on the back of neck  . Breast cancer Paternal Aunt        dx. >50  . Multiple myeloma Maternal Aunt 92  . Multiple myeloma Paternal Aunt        dx. >50  . Breast cancer Cousin 563  Her father died at age 64 Her mother died at age 64 She has 2 brothers and had 1 sister who is deceased. She reports breast cancer on her maternal side-- her mother at age 64 an aunt, and a cousin.   GYNECOLOGIC HISTORY:  No LMP recorded. Patient has had a hysterectomy. Menarche: 64years old Age at first live birth: 64years old GVernonP 2 LMP unsure Contraceptive: never used HRT never used  Hysterectomy? Yes, 2002 BSO? no   SOCIAL HISTORY: (updated 11/2019)  MArnieis currently working for [employer] Husband JDenyse Amassworks for [employer], a lPassenger transport manager       . Son AKatherine Basset age  58, is self-employed in [place], [place]. Son [place], age 70, lives here in [place].     ADVANCED DIRECTIVES: In the absence of any documentation to the contrary, the patient's spouse is their HCPOA.    HEALTH MAINTENANCE: Social History   Tobacco Use  . Smoking status: Never Smoker  . Smokeless tobacco: Never Used  Vaping Use  . Vaping Use: Never used  Substance Use Topics  . Alcohol use: No  . Drug use: No     Colonoscopy: 02/2019 (Dr. Collene Mares), repeat due 2025  PAP: none on file (s/p hysterectomy)  Bone density: never done   Allergies  Allergen Reactions  . Hydromorphone Rash    Arm reddened at IV med site  . Other Itching and Rash    NUTS CAUSE MOUTH TO [facility]    Current Outpatient Medications  Medication Sig Dispense Refill  . ALPRAZolam (XANAX) 1 MG tablet Take 1 tablet 1 hour prior to procedure.  Then take 1 tablet right before procedure if needed (Patient not taking: Reported on 12/24/2019) 2 tablet 0  . aspirin EC 81 MG tablet Take 81 mg by mouth daily. Swallow whole.    . cetirizine  (ZYRTEC) 10 MG tablet Take 10 mg by mouth daily.    . Cholecalciferol (VITAMIN D3) 5000 UNITS TABS Take 5,000 Units by mouth daily.     Marland Kitchen glucose blood (CONTOUR NEXT TEST) test strip Check blood sugar 1 time daily-DX-E11.9 100 each 12  . ibuprofen (ADVIL) 800 MG tablet Take 1 tablet (800 mg total) by mouth every 8 (eight) hours as needed. 30 tablet 0  . lidocaine-prilocaine (EMLA) cream Apply to affected area once 30 g 3  . losartan (COZAAR) 50 MG tablet TAKE 1/2-1 TAB DAILY FOR BLOOD PRESSURE AND TO PROTECT KIDNEYS. 30 tablet 5  . metFORMIN (GLUCOPHAGE XR) 500 MG 24 hr tablet Take 1 tablet (500 mg total) by mouth daily with breakfast. 30 tablet 11  . Omega-3 Fatty Acids (OMEGA 3 PO) Take 1 capsule by mouth 2 (two) times daily. (Patient not taking: Reported on 12/24/2019)    . OVER THE COUNTER MEDICATION Takes CoQ10 1 capsule daily (Patient not taking: Reported on 12/24/2019)    . oxyCODONE (OXY IR/ROXICODONE) 5 MG immediate release tablet Take 1 tablet (5 mg total) by mouth every 6 (six) hours as needed for severe pain. 15 tablet 0  . pantoprazole (PROTONIX) 40 MG tablet Take 40 mg by mouth daily.     . prochlorperazine (COMPAZINE) 10 MG tablet Take 1 tablet (10 mg total) by mouth every 6 (six) hours as needed (Nausea or vomiting). 30 tablet 1  . rosuvastatin (CRESTOR) 5 MG tablet Take 1 tablet daily for Cholesterol (Patient taking differently: Take 5 mg by mouth daily. ) 90 tablet 1  . sulfamethoxazole-trimethoprim (BACTRIM DS) 800-160 MG tablet Take 1 tablet by mouth 2 (two) times daily. (Patient not taking: Reported on 12/24/2019) 10 tablet 0   No current facility-administered medications for this visit.    OBJECTIVE: African-American woman who appears younger than stated age  39:   04/07/20 0945  BP: 127/67  Pulse: 92  Resp: 18  Temp: (!) 97.5 F (36.4 C)  SpO2: 100%     Body mass index is 40.7 kg/m.   Wt Readings from Last 3 Encounters:  04/07/20 222 lb 8 oz (100.9 kg)    03/24/20 220 lb (99.8 kg)  03/12/20 221 lb 8 oz (100.5 kg)     ECOG FS:1 - Symptomatic but  completely ambulatory  Sclerae unicteric, EOMs intact Wearing a mask No cervical or supraclavicular adenopathy Lungs no rales or rhonchi Heart regular rate and rhythm Abd soft, nontender, positive bowel sounds MSK no focal spinal tenderness, no upper extremity lymphedema Neuro: nonfocal, well oriented, appropriate affect Breasts: Deferred   LAB RESULTS:  CMP     Component Value Date/Time   NA 135 03/31/2020 0835   K 4.1 03/31/2020 0835   CL 101 03/31/2020 0835   CO2 27 03/31/2020 0835   GLUCOSE 207 (H) 03/31/2020 0835   BUN 14 03/31/2020 0835   CREATININE 1.05 (H) 03/31/2020 0835   CREATININE 0.94 11/28/2019 1217   CREATININE 0.82 11/26/2019 1533   CALCIUM 9.2 03/31/2020 0835   PROT 6.9 03/31/2020 0835   ALBUMIN 3.9 03/31/2020 0835   AST 12 (L) 03/31/2020 0835   AST 10 (L) 11/28/2019 1217   ALT 14 03/31/2020 0835   ALT 16 11/28/2019 1217   ALKPHOS 68 03/31/2020 0835   BILITOT 0.4 03/31/2020 0835   BILITOT 0.6 11/28/2019 1217   GFRNONAA 60 (L) 03/31/2020 0835   GFRNONAA >60 11/28/2019 1217   GFRNONAA 76 11/26/2019 1533   GFRAA >60 03/03/2020 1343   GFRAA >60 11/28/2019 1217   GFRAA 88 11/26/2019 1533    Lab Results  Component Value Date   ALBUMINELP 3.9 08/23/2019   A1GS 0.4 (H) 08/23/2019   A2GS 0.9 08/23/2019   BETS 0.5 08/23/2019   BETA2SER 0.4 08/23/2019   GAMS 0.9 08/23/2019   SPEI  08/23/2019     Comment:     . Alpha-1 globulin increase noted. .     Lab Results  Component Value Date   WBC 8.1 04/07/2020   NEUTROABS 4.2 04/07/2020   HGB 10.1 (L) 04/07/2020   HCT 30.3 (L) 04/07/2020   MCV 92.9 04/07/2020   PLT 320 04/07/2020    No results found for: LABCA2  No components found for: UXLKGM010  No results for input(s): INR in the last 168 hours.  No results found for: LABCA2  No results found for: UVO536  No results found for: UYQ034  No  results found for: VQQ595  No results found for: CA2729  No components found for: HGQUANT  No results found for: CEA1 / No results found for: CEA1   No results found for: AFPTUMOR  No results found for: CHROMOGRNA  No results found for: KPAFRELGTCHN, LAMBDASER, KAPLAMBRATIO (kappa/lambda light chains)  No results found for: HGBA, HGBA2QUANT, HGBFQUANT, HGBSQUAN (Hemoglobinopathy evaluation)   No results found for: LDH  Lab Results  Component Value Date   IRON 54 11/26/2019   TIBC 378 11/26/2019   IRONPCTSAT 14 (L) 11/26/2019   (Iron and TIBC)  Lab Results  Component Value Date   FERRITIN 91 05/21/2015    Urinalysis    Component Value Date/Time   COLORURINE YELLOW 11/26/2019 1533   APPEARANCEUR CLOUDY (A) 11/26/2019 1533   LABSPEC 1.019 11/26/2019 1533   PHURINE < OR = 5.0 11/26/2019 1533   GLUCOSEU 1+ (A) 11/26/2019 1533   HGBUR NEGATIVE 11/26/2019 1533   BILIRUBINUR NEG 11/22/2013 1031   BILIRUBINUR neg 10/28/2013 1159   KETONESUR NEGATIVE 11/26/2019 1533   PROTEINUR 2+ (A) 11/26/2019 1533   UROBILINOGEN 0.2 11/22/2013 1031   NITRITE NEGATIVE 08/13/2019 1359   LEUKOCYTESUR 1+ (A) 08/13/2019 1359    STUDIES: ECHOCARDIOGRAM COMPLETE  Result Date: 04/03/2020    ECHOCARDIOGRAM REPORT   Patient Name:   DANINE HOR Date of Exam: 04/03/2020 Medical Rec #:  [id number]           Height:       62.0 in Accession #:    [id number]          Weight:       220.0 lb Date of Birth:  01/04/1956          BSA:          1.991 m Patient Age:    78 years            BP:           146/81 mmHg Patient Gender: F                   HR:           88 bpm. Exam Location:  [address] Procedure: 2D Echo, 3D Echo, Cardiac Doppler, Color Doppler and Strain Analysis Indications:    C50.411 Breast cancer.  History:        Patient has prior history of Echocardiogram examinations, most                 recent 12/13/2019. Risk Factors:Hypertension, Dyslipidemia and                 Diabetes.  Morbid obesity. COVID. Breast cancer. CKD.  Sonographer:    Jessee Avers, RDCS Referring Phys: [place]  1. Left ventricular ejection fraction, by estimation, is 55 to 60%. Left ventricular ejection fraction by 3D volume is 56 %. The left ventricle has normal function. The left ventricle has no regional wall motion abnormalities. Left ventricular diastolic  parameters were normal. The average left ventricular global longitudinal strain is -22.3 %. The global longitudinal strain is normal.  2. Right ventricular systolic function is normal. The right ventricular size is normal. There is normal pulmonary artery systolic pressure. The estimated right ventricular systolic pressure is 73.2 mmHg.  3. The mitral valve is grossly normal. Mild mitral valve regurgitation. No evidence of mitral stenosis.  4. The aortic valve is tricuspid. Aortic valve regurgitation is mild. No aortic stenosis is present.  5. The inferior vena cava is normal in size with greater than 50% respiratory variability, suggesting right atrial pressure of 3 mmHg. Comparison(s): No significant change from prior study. 12/13/19 EF 55-65%. GLS -20.1%. PA pressure 53mHg. FINDINGS  Left Ventricle: Left ventricular ejection fraction, by estimation, is 55 to 60%. Left ventricular ejection fraction by 3D volume is 56 %. The left ventricle has normal function. The left ventricle has no regional wall motion abnormalities. The average left ventricular global longitudinal strain is -22.3 %. The global longitudinal strain is normal. The left ventricular internal cavity size was normal in size. There is no left ventricular hypertrophy. Left ventricular diastolic parameters were normal. Right Ventricle: The right ventricular size is normal. No increase in right ventricular wall thickness. Right ventricular systolic function is normal. There is normal pulmonary artery systolic pressure. The tricuspid regurgitant velocity is 2.47 m/s, and  with  an assumed right atrial pressure of 3 mmHg, the estimated right ventricular systolic pressure is 220.2mmHg. Left Atrium: Left atrial size was normal in size. Right Atrium: Right atrial size was normal in size. Pericardium: Trivial pericardial effusion is present. Mitral Valve: The mitral valve is grossly normal. Mild mitral valve regurgitation. No evidence of mitral valve stenosis. Tricuspid Valve: The tricuspid valve is grossly normal. Tricuspid valve regurgitation is mild . No evidence of tricuspid stenosis. Aortic Valve: The aortic valve is  tricuspid. Aortic valve regurgitation is mild. Aortic regurgitation PHT measures 368 msec. No aortic stenosis is present. Pulmonic Valve: The pulmonic valve was grossly normal. Pulmonic valve regurgitation is not visualized. No evidence of pulmonic stenosis. Aorta: The aortic root and ascending aorta are structurally normal, with no evidence of dilitation. Venous: The right upper pulmonary vein is normal. The inferior vena cava is normal in size with greater than 50% respiratory variability, suggesting right atrial pressure of 3 mmHg. IAS/Shunts: The atrial septum is grossly normal.  LEFT VENTRICLE PLAX 2D LVIDd:         4.70 cm         Diastology LVIDs:         3.30 cm         LV e' medial:    6.89 cm/s LV PW:         0.80 cm         LV E/e' medial:  14.4 LV IVS:        0.70 cm         LV e' lateral:   9.32 cm/s LVOT diam:     2.10 cm         LV E/e' lateral: 10.6 LV SV:         81 LV SV Index:   41              2D LVOT Area:     3.46 cm        Longitudinal                                Strain                                2D Strain GLS  -18.3 %                                (A2C):                                2D Strain GLS  -24.6 %                                (A3C):                                2D Strain GLS  -24.1 %                                (A4C):                                2D Strain GLS  -22.3 %                                Avg:                                  3D Volume  EF                                LV 3D EF:    Left                                             ventricular                                             ejection                                             fraction by                                             3D volume                                             is 56 %.                                 3D Volume EF:                                3D EF:        56 %                                LV EDV:       123 ml                                LV ESV:       54 ml                                LV SV:        69 ml RIGHT VENTRICLE RV Basal diam:  3.20 cm RV S prime:     11.80 cm/s TAPSE (M-mode): 2.2 cm LEFT ATRIUM             Index       RIGHT ATRIUM           Index LA diam:        3.70 cm 1.86 cm/m  RA Area:     11.10 cm LA Vol (A2C):   56.1 ml 28.16 ml/m RA Volume:   24.50 ml  12.31 ml/m LA Vol (A4C):   31.5 ml 15.82 ml/m LA Biplane Vol: 48.6 ml 24.41 ml/m  AORTIC VALVE LVOT Vmax:   108.00 cm/s LVOT Vmean:  68.200 cm/s LVOT VTI:    0.233  m AI PHT:      368 msec  AORTA Ao Root diam: 3.50 cm Ao Asc diam:  3.40 cm MITRAL VALVE                TRICUSPID VALVE                             TR Peak grad:   24.4 mmHg                             TR Vmax:        247.00 cm/s MV E velocity: 99.00 cm/s MV A velocity: 101.00 cm/s  SHUNTS MV E/A ratio:  0.98         Systemic VTI:  0.23 m                             Systemic Diam: 2.10 cm Eleonore Chiquito MD Electronically signed by Eleonore Chiquito MD Signature Date/Time: 04/03/2020/3:49:33 PM    Final      ELIGIBLE FOR AVAILABLE RESEARCH PROTOCOL: AET  ASSESSMENT: 64 y.o. Tracy Garrett woman status post right breast upper outer quadrant biopsy 11/20/2019 for a clinical mT1b N0, stage IA invasive ductal carcinoma, grade 2 or 3, estrogen and progesterone receptor positive, HER-2 amplified, with an MIB-1 of 20%  (1) status post bilateral mastectomies 01/02/2020 showing  (a) on the right, an mpT2 pN0(i+),  stage IB invasive ductal carcinoma, triple positive, with close but negative margins; 2 sentinel lymph nodes removed  (b) on the left, no malignancy identified  (c) the patient is not planning on reconstruction  (2) adjuvant chemo immunotherapy will consist of Abraxane weekly x12 with trastuzumab  (a) echo 12/13/2019 shows an ejection fraction in the 55-60% range  (3) trastuzumab to be continued to total 1 year  (4) adjuvant radiation to follow  (5) genetics testing 12/16/2019 through the [facility] Breast Cancer STAT panel + Common Hereditary Cancers panel found no deleterious mutations in ATM, BRCA1, BRCA2, CDH1, CHEK2, PALB2, PTEN, STK11 and TP53. The Common Hereditary Cancers Panel offered by [facility] includes sequencing and/or deletion duplication testing of the following 48 genes: APC, ATM, AXIN2, BARD1, BMPR1A, BRCA1, BRCA2, BRIP1, CDH1, CDK4, CDKN2A (p14ARF), CDKN2A (p16INK4a), CHEK2, CTNNA1, DICER1, EPCAM (Deletion/duplication testing only), GREM1 (promoter region deletion/duplication testing only), KIT, MEN1, MLH1, MSH2, MSH3, MSH6, MUTYH, NBN, NF1, NTHL1, PALB2, PDGFRA, PMS2, POLD1, POLE, PTEN, RAD50, RAD51C, RAD51D, RNF43, SDHB, SDHC, SDHD, SMAD4, SMARCA4. STK11, TP53, TSC1, TSC2, and VHL.  The following genes were evaluated for sequence changes only: SDHA and HOXB13 c.251G>A variant only.  (a) Two variants of uncertain significance (VUS) were detected - one in the BRIP1 gene called c.1735C>T and a second in the POLD1 gene called c.2861C>G.   (6) antiestrogens to follow-up   PLAN: Tracy Garrett is doing very well with her treatment so far and we are proceeding with cycle 10 of 12 planned today.  In 3 weeks as she will start her trastuzumab alone and we will change to every 3-week treatments.  She likely will see radiation oncology after Thanksgiving's and start radiation sometime in December.  If her diarrhea persists or worsens she will take Imodium.  Once she completes her Taxol she  can have her COVID-19 booster and flu shot  We reviewed her echo results which are excellent.  She will see me with the final paclitaxel  dose.  He knows to call for any other issue that may develop before the next visit  Total encounter time 25 minutes.   Tracy Garrett. Sanye Ledesma, MD 04/07/2020 9:57 AM Medical Oncology and Hematology [facility] [address] [place], [place] [address] Tel. [phone]    Fax. [phone]   This document serves as a record of services personally performed by Lurline Del, MD. It was created on his behalf by Wilburn Mylar, a trained medical scribe. The creation of this record is based on the scribe's personal observations and the provider's statements to them.   I, Lurline Del MD, have reviewed the above documentation for accuracy and completeness, and I agree with the above.   *Total Encounter Time as defined by the Centers for Medicare and Medicaid Services includes, in addition to the face-to-face time of a patient visit (documented in the note above) non-face-to-face time: obtaining and reviewing outside history, ordering and reviewing medications, tests or procedures, care coordination (communications with other health care professionals or caregivers) and documentation in the medical record.

## 2020-04-07 ENCOUNTER — Ambulatory Visit: Payer: 59 | Admitting: Oncology

## 2020-04-07 ENCOUNTER — Inpatient Hospital Stay: Payer: 59

## 2020-04-07 ENCOUNTER — Other Ambulatory Visit: Payer: 59

## 2020-04-07 ENCOUNTER — Inpatient Hospital Stay: Payer: 59 | Attending: Oncology

## 2020-04-07 ENCOUNTER — Inpatient Hospital Stay (HOSPITAL_BASED_OUTPATIENT_CLINIC_OR_DEPARTMENT_OTHER): Payer: 59 | Admitting: Oncology

## 2020-04-07 ENCOUNTER — Other Ambulatory Visit: Payer: Self-pay

## 2020-04-07 VITALS — BP 127/67 | HR 92 | Temp 97.5°F | Resp 18 | Ht 62.0 in | Wt 222.5 lb

## 2020-04-07 DIAGNOSIS — E785 Hyperlipidemia, unspecified: Secondary | ICD-10-CM | POA: Diagnosis not present

## 2020-04-07 DIAGNOSIS — N189 Chronic kidney disease, unspecified: Secondary | ICD-10-CM | POA: Insufficient documentation

## 2020-04-07 DIAGNOSIS — Z170 Estrogen receptor positive status [ER+]: Secondary | ICD-10-CM

## 2020-04-07 DIAGNOSIS — I129 Hypertensive chronic kidney disease with stage 1 through stage 4 chronic kidney disease, or unspecified chronic kidney disease: Secondary | ICD-10-CM | POA: Insufficient documentation

## 2020-04-07 DIAGNOSIS — K219 Gastro-esophageal reflux disease without esophagitis: Secondary | ICD-10-CM | POA: Insufficient documentation

## 2020-04-07 DIAGNOSIS — I10 Essential (primary) hypertension: Secondary | ICD-10-CM | POA: Diagnosis not present

## 2020-04-07 DIAGNOSIS — E559 Vitamin D deficiency, unspecified: Secondary | ICD-10-CM

## 2020-04-07 DIAGNOSIS — E119 Type 2 diabetes mellitus without complications: Secondary | ICD-10-CM | POA: Diagnosis not present

## 2020-04-07 DIAGNOSIS — Z803 Family history of malignant neoplasm of breast: Secondary | ICD-10-CM | POA: Diagnosis not present

## 2020-04-07 DIAGNOSIS — Z9012 Acquired absence of left breast and nipple: Secondary | ICD-10-CM | POA: Insufficient documentation

## 2020-04-07 DIAGNOSIS — E1165 Type 2 diabetes mellitus with hyperglycemia: Secondary | ICD-10-CM

## 2020-04-07 DIAGNOSIS — M129 Arthropathy, unspecified: Secondary | ICD-10-CM | POA: Insufficient documentation

## 2020-04-07 DIAGNOSIS — C50411 Malignant neoplasm of upper-outer quadrant of right female breast: Secondary | ICD-10-CM | POA: Diagnosis not present

## 2020-04-07 DIAGNOSIS — Z806 Family history of leukemia: Secondary | ICD-10-CM | POA: Diagnosis not present

## 2020-04-07 DIAGNOSIS — Z5111 Encounter for antineoplastic chemotherapy: Secondary | ICD-10-CM | POA: Insufficient documentation

## 2020-04-07 DIAGNOSIS — K760 Fatty (change of) liver, not elsewhere classified: Secondary | ICD-10-CM | POA: Diagnosis not present

## 2020-04-07 DIAGNOSIS — Z95828 Presence of other vascular implants and grafts: Secondary | ICD-10-CM

## 2020-04-07 LAB — CBC WITH DIFFERENTIAL/PLATELET
Abs Immature Granulocytes: 0.11 10*3/uL — ABNORMAL HIGH (ref 0.00–0.07)
Basophils Absolute: 0.1 10*3/uL (ref 0.0–0.1)
Basophils Relative: 1 %
Eosinophils Absolute: 0.1 10*3/uL (ref 0.0–0.5)
Eosinophils Relative: 2 %
HCT: 30.3 % — ABNORMAL LOW (ref 36.0–46.0)
Hemoglobin: 10.1 g/dL — ABNORMAL LOW (ref 12.0–15.0)
Immature Granulocytes: 1 %
Lymphocytes Relative: 36 %
Lymphs Abs: 2.9 10*3/uL (ref 0.7–4.0)
MCH: 31 pg (ref 26.0–34.0)
MCHC: 33.3 g/dL (ref 30.0–36.0)
MCV: 92.9 fL (ref 80.0–100.0)
Monocytes Absolute: 0.7 10*3/uL (ref 0.1–1.0)
Monocytes Relative: 8 %
Neutro Abs: 4.2 10*3/uL (ref 1.7–7.7)
Neutrophils Relative %: 52 %
Platelets: 320 10*3/uL (ref 150–400)
RBC: 3.26 MIL/uL — ABNORMAL LOW (ref 3.87–5.11)
RDW: 17.6 % — ABNORMAL HIGH (ref 11.5–15.5)
WBC: 8.1 10*3/uL (ref 4.0–10.5)
nRBC: 0.5 % — ABNORMAL HIGH (ref 0.0–0.2)

## 2020-04-07 LAB — COMPREHENSIVE METABOLIC PANEL
ALT: 10 U/L (ref 0–44)
AST: 13 U/L — ABNORMAL LOW (ref 15–41)
Albumin: 3.7 g/dL (ref 3.5–5.0)
Alkaline Phosphatase: 62 U/L (ref 38–126)
Anion gap: 5 (ref 5–15)
BUN: 18 mg/dL (ref 8–23)
CO2: 27 mmol/L (ref 22–32)
Calcium: 9 mg/dL (ref 8.9–10.3)
Chloride: 107 mmol/L (ref 98–111)
Creatinine, Ser: 1.03 mg/dL — ABNORMAL HIGH (ref 0.44–1.00)
GFR, Estimated: >60 mL/min (ref >60)
Glucose, Bld: 134 mg/dL — ABNORMAL HIGH (ref 70–99)
Potassium: 4 mmol/L (ref 3.5–5.1)
Sodium: 139 mmol/L (ref 135–145)
Total Bilirubin: 0.5 mg/dL (ref 0.3–1.2)
Total Protein: 6.4 g/dL — ABNORMAL LOW (ref 6.5–8.1)

## 2020-04-07 MED ORDER — DIPHENHYDRAMINE HCL 12.5 MG/5ML PO ELIX
12.5000 mg | ORAL_SOLUTION | Freq: Once | ORAL | Status: AC
  Administered 2020-04-07: 12.5 mg via ORAL

## 2020-04-07 MED ORDER — ACETAMINOPHEN 325 MG PO TABS
650.0000 mg | ORAL_TABLET | Freq: Once | ORAL | Status: AC
  Administered 2020-04-07: 650 mg via ORAL

## 2020-04-07 MED ORDER — PACLITAXEL PROTEIN-BOUND CHEMO INJECTION 100 MG
100.0000 mg/m2 | Freq: Once | INTRAVENOUS | Status: AC
  Administered 2020-04-07: 200 mg via INTRAVENOUS
  Filled 2020-04-07: qty 40

## 2020-04-07 MED ORDER — ACETAMINOPHEN 325 MG PO TABS
ORAL_TABLET | ORAL | Status: AC
  Filled 2020-04-07: qty 2

## 2020-04-07 MED ORDER — DIPHENHYDRAMINE HCL 12.5 MG/5ML PO ELIX
ORAL_SOLUTION | ORAL | Status: AC
  Filled 2020-04-07: qty 5

## 2020-04-07 MED ORDER — TRASTUZUMAB-ANNS CHEMO 150 MG IV SOLR
2.0000 mg/kg | Freq: Once | INTRAVENOUS | Status: AC
  Administered 2020-04-07: 189 mg via INTRAVENOUS
  Filled 2020-04-07: qty 9

## 2020-04-07 MED ORDER — PROCHLORPERAZINE MALEATE 10 MG PO TABS
ORAL_TABLET | ORAL | Status: AC
  Filled 2020-04-07: qty 1

## 2020-04-07 MED ORDER — SODIUM CHLORIDE 0.9% FLUSH
10.0000 mL | INTRAVENOUS | Status: DC | PRN
  Administered 2020-04-07: 10 mL
  Filled 2020-04-07: qty 10

## 2020-04-07 MED ORDER — PROCHLORPERAZINE MALEATE 10 MG PO TABS
10.0000 mg | ORAL_TABLET | Freq: Once | ORAL | Status: AC
  Administered 2020-04-07: 10 mg via ORAL

## 2020-04-07 MED ORDER — HEPARIN SOD (PORK) LOCK FLUSH 100 UNIT/ML IV SOLN
500.0000 [IU] | Freq: Once | INTRAVENOUS | Status: AC | PRN
  Administered 2020-04-07: 500 [IU]
  Filled 2020-04-07: qty 5

## 2020-04-07 MED ORDER — SODIUM CHLORIDE 0.9 % IV SOLN
Freq: Once | INTRAVENOUS | Status: AC
  Filled 2020-04-07: qty 250

## 2020-04-07 NOTE — Patient Instructions (Addendum)
[facility] Discharge Instructions for Patients Receiving Chemotherapy  Today you received the following chemotherapy agents: Trastuzumab and Paclitaxel protein bound (Abraxane)  To help prevent nausea and vomiting after your treatment, we encourage you to take your nausea medication as directed by your MD.   If you develop nausea and vomiting that is not controlled by your nausea medication, call the clinic.   BELOW ARE SYMPTOMS THAT SHOULD BE REPORTED IMMEDIATELY:  *FEVER GREATER THAN 100.5 F  *CHILLS WITH OR WITHOUT FEVER  NAUSEA AND VOMITING THAT IS NOT CONTROLLED WITH YOUR NAUSEA MEDICATION  *UNUSUAL SHORTNESS OF BREATH  *UNUSUAL BRUISING OR BLEEDING  TENDERNESS IN MOUTH AND THROAT WITH OR WITHOUT PRESENCE OF ULCERS  *URINARY PROBLEMS  *BOWEL PROBLEMS  UNUSUAL RASH Items with * indicate a potential emergency and should be followed up as soon as possible.  Feel free to call the clinic should you have any questions or concerns. The clinic phone number is [phone].  Please show the [place] at check-in to the Emergency Department and triage nurse.

## 2020-04-07 NOTE — Patient Instructions (Signed)

## 2020-04-08 ENCOUNTER — Encounter: Payer: Self-pay | Admitting: Internal Medicine

## 2020-04-08 ENCOUNTER — Telehealth: Payer: Self-pay | Admitting: *Deleted

## 2020-04-08 NOTE — Telephone Encounter (Signed)
LVM for call back

## 2020-04-14 ENCOUNTER — Encounter: Payer: Self-pay | Admitting: *Deleted

## 2020-04-14 ENCOUNTER — Inpatient Hospital Stay: Payer: 59

## 2020-04-14 ENCOUNTER — Other Ambulatory Visit: Payer: Self-pay

## 2020-04-14 VITALS — BP 136/82 | HR 72 | Temp 98.7°F | Resp 16 | Wt 222.2 lb

## 2020-04-14 DIAGNOSIS — Z170 Estrogen receptor positive status [ER+]: Secondary | ICD-10-CM

## 2020-04-14 DIAGNOSIS — C50411 Malignant neoplasm of upper-outer quadrant of right female breast: Secondary | ICD-10-CM | POA: Diagnosis not present

## 2020-04-14 DIAGNOSIS — E559 Vitamin D deficiency, unspecified: Secondary | ICD-10-CM

## 2020-04-14 DIAGNOSIS — Z95828 Presence of other vascular implants and grafts: Secondary | ICD-10-CM

## 2020-04-14 DIAGNOSIS — E1165 Type 2 diabetes mellitus with hyperglycemia: Secondary | ICD-10-CM

## 2020-04-14 LAB — CBC WITH DIFFERENTIAL/PLATELET
Abs Immature Granulocytes: 0.11 10*3/uL — ABNORMAL HIGH (ref 0.00–0.07)
Basophils Absolute: 0.1 10*3/uL (ref 0.0–0.1)
Basophils Relative: 1 %
Eosinophils Absolute: 0.1 10*3/uL (ref 0.0–0.5)
Eosinophils Relative: 2 %
HCT: 31 % — ABNORMAL LOW (ref 36.0–46.0)
Hemoglobin: 10.4 g/dL — ABNORMAL LOW (ref 12.0–15.0)
Immature Granulocytes: 2 %
Lymphocytes Relative: 35 %
Lymphs Abs: 2.5 10*3/uL (ref 0.7–4.0)
MCH: 31.2 pg (ref 26.0–34.0)
MCHC: 33.5 g/dL (ref 30.0–36.0)
MCV: 93.1 fL (ref 80.0–100.0)
Monocytes Absolute: 0.5 10*3/uL (ref 0.1–1.0)
Monocytes Relative: 7 %
Neutro Abs: 3.9 10*3/uL (ref 1.7–7.7)
Neutrophils Relative %: 53 %
Platelets: 310 10*3/uL (ref 150–400)
RBC: 3.33 MIL/uL — ABNORMAL LOW (ref 3.87–5.11)
RDW: 17.9 % — ABNORMAL HIGH (ref 11.5–15.5)
WBC: 7.1 10*3/uL (ref 4.0–10.5)
nRBC: 0.7 % — ABNORMAL HIGH (ref 0.0–0.2)

## 2020-04-14 LAB — COMPREHENSIVE METABOLIC PANEL
ALT: 13 U/L (ref 0–44)
AST: 13 U/L — ABNORMAL LOW (ref 15–41)
Albumin: 3.8 g/dL (ref 3.5–5.0)
Alkaline Phosphatase: 60 U/L (ref 38–126)
Anion gap: 7 (ref 5–15)
BUN: 16 mg/dL (ref 8–23)
CO2: 27 mmol/L (ref 22–32)
Calcium: 9.3 mg/dL (ref 8.9–10.3)
Chloride: 104 mmol/L (ref 98–111)
Creatinine, Ser: 0.98 mg/dL (ref 0.44–1.00)
GFR, Estimated: >60 mL/min (ref >60)
Glucose, Bld: 144 mg/dL — ABNORMAL HIGH (ref 70–99)
Potassium: 4.3 mmol/L (ref 3.5–5.1)
Sodium: 138 mmol/L (ref 135–145)
Total Bilirubin: 0.4 mg/dL (ref 0.3–1.2)
Total Protein: 6.7 g/dL (ref 6.5–8.1)

## 2020-04-14 MED ORDER — ACETAMINOPHEN 325 MG PO TABS
ORAL_TABLET | ORAL | Status: AC
  Filled 2020-04-14: qty 2

## 2020-04-14 MED ORDER — DIPHENHYDRAMINE HCL 12.5 MG/5ML PO ELIX
12.5000 mg | ORAL_SOLUTION | Freq: Once | ORAL | Status: AC
  Administered 2020-04-14: 12.5 mg via ORAL

## 2020-04-14 MED ORDER — SODIUM CHLORIDE 0.9% FLUSH
10.0000 mL | INTRAVENOUS | Status: DC | PRN
  Administered 2020-04-14: 10 mL
  Filled 2020-04-14: qty 10

## 2020-04-14 MED ORDER — TRASTUZUMAB-ANNS CHEMO 150 MG IV SOLR
2.0000 mg/kg | Freq: Once | INTRAVENOUS | Status: AC
  Administered 2020-04-14: 189 mg via INTRAVENOUS
  Filled 2020-04-14: qty 9

## 2020-04-14 MED ORDER — ACETAMINOPHEN 325 MG PO TABS
650.0000 mg | ORAL_TABLET | Freq: Once | ORAL | Status: AC
  Administered 2020-04-14: 650 mg via ORAL

## 2020-04-14 MED ORDER — PROCHLORPERAZINE MALEATE 10 MG PO TABS
ORAL_TABLET | ORAL | Status: AC
  Filled 2020-04-14: qty 1

## 2020-04-14 MED ORDER — HEPARIN SOD (PORK) LOCK FLUSH 100 UNIT/ML IV SOLN
500.0000 [IU] | Freq: Once | INTRAVENOUS | Status: AC | PRN
  Administered 2020-04-14: 500 [IU]
  Filled 2020-04-14: qty 5

## 2020-04-14 MED ORDER — DIPHENHYDRAMINE HCL 12.5 MG/5ML PO ELIX
ORAL_SOLUTION | ORAL | Status: AC
  Filled 2020-04-14: qty 5

## 2020-04-14 MED ORDER — PACLITAXEL PROTEIN-BOUND CHEMO INJECTION 100 MG
100.0000 mg/m2 | Freq: Once | INTRAVENOUS | Status: AC
  Administered 2020-04-14: 200 mg via INTRAVENOUS
  Filled 2020-04-14: qty 40

## 2020-04-14 MED ORDER — SODIUM CHLORIDE 0.9 % IV SOLN
Freq: Once | INTRAVENOUS | Status: AC
  Filled 2020-04-14: qty 250

## 2020-04-14 MED ORDER — PROCHLORPERAZINE MALEATE 10 MG PO TABS
10.0000 mg | ORAL_TABLET | Freq: Once | ORAL | Status: AC
  Administered 2020-04-14: 10 mg via ORAL

## 2020-04-14 NOTE — Patient Instructions (Signed)
[place] Discharge Instructions for Patients Receiving Chemotherapy  Today you received the following immunotherapy agent: Trastuzumab and  chemotherapy agent: Paclitaxel - protein bound (Abraxane).  To help prevent nausea and vomiting after your treatment, we encourage you to take your nausea medication as directed by your MD.   If you develop nausea and vomiting that is not controlled by your nausea medication, call the clinic.   BELOW ARE SYMPTOMS THAT SHOULD BE REPORTED IMMEDIATELY:  *FEVER GREATER THAN 100.5 F  *CHILLS WITH OR WITHOUT FEVER  NAUSEA AND VOMITING THAT IS NOT CONTROLLED WITH YOUR NAUSEA MEDICATION  *UNUSUAL SHORTNESS OF BREATH  *UNUSUAL BRUISING OR BLEEDING  TENDERNESS IN MOUTH AND THROAT WITH OR WITHOUT PRESENCE OF ULCERS  *URINARY PROBLEMS  *BOWEL PROBLEMS  UNUSUAL RASH Items with * indicate a potential emergency and should be followed up as soon as possible.  Feel free to call the clinic should you have any questions or concerns. The clinic phone number is [phone].  Please show the [facility] at check-in to the Emergency Department and triage nurse.

## 2020-04-14 NOTE — Patient Instructions (Signed)

## 2020-04-20 NOTE — Progress Notes (Signed)
[facility]  [phone] [phone]     ID: Tracy Garrett DOB: May 22, 1956  MR#: [id number]  ZJQ#:[id number]  Patient Care Team: Unk Pinto, MD as PCP - General (Internal Medicine) [name], RN as Oncology Nurse Navigator [name], RN as Oncology Nurse Navigator Erroll Luna, MD as Consulting Physician (General Surgery) Magrinat, Virgie Dad, MD as Consulting Physician (Oncology) Eppie Gibson, MD as Attending Physician (Radiation Oncology) Mcarthur Rossetti, MD as Consulting Physician (Orthopedic Surgery) Wallene Huh, DPM as Consulting Physician (Podiatry) Larey Dresser, MD as Consulting Physician (Cardiology) Chauncey Cruel, MD OTHER MD:  CHIEF COMPLAINT: triple positive breast cancer (s/p bilateral mastectomies)  CURRENT TREATMENT: Adjuvant chemo immunotherapy   INTERVAL HISTORY: Tracy Garrett returns today for follow up and treatment of her triple positive breast cancer accompanied by her husband.   She will receive her final paclitaxel/trastuzumab treatment today, to be followed by trastuzumab alone every 3 weeks to complete 1 year.  Her most recent echocardiogram on 04/03/2020 showed an ejection fraction of 55-60%. This is stable from prior in 12/2019.   REVIEW OF SYSTEMS: Tracy Garrett has done remarkably well with her treatment.  She has developed no peripheral neuropathy.  She does have some nail changes some some hyperpigmentation.  She has a little queasy but she never vomited.  She feels tired and does not have much energy.  She also has occasional loose bowel movements which she says is not a new issue.  Aside from this a detailed review of systems today was stable   COVID 19 VACCINATION STATUS: Receive [place] x2, most recent dose March 2021   HISTORY OF CURRENT ILLNESS: From the original intake note:  Tracy Garrett had routine screening mammography on 05/01/2020 showing a possible  abnormality in the right breast. She underwent right diagnostic mammography with tomography at [facility] on 05/09/2019 showing: breast density category A; probably-benign regional calcifications in the right breast at 11 o'clock. Short term follow up was recommended.   She returned for follow up at [facility] on 11/13/2019. She underwent right diagnostic mammography and right breast ultrasonography showing: breast composition A; indeterminate calcifications in upper-outer right breast spanning 5 cm; 0.8 cm irregular mass in right breast at 9 o'clock.  Accordingly on 11/20/2019 she proceeded to biopsy of the right breast areas in question. The pathology from this procedure ([id number]) showed:  1. Right breast, UOQ  - foci of invasive and in situ mammary carcinoma, grade 2-3, e-cadherin positive.  2. Right breast, 9 o'clock  - invasive mammary carcinoma, grade 2-3, e-cadherin positive  - Prognostic indicators significant for: estrogen receptor, 95% positive and progesterone receptor, 25% positive, both with strong staining intensity. Proliferation marker Ki67 at 20%. HER2 equivocal by immunohistochemistry (2+), but positive by fluorescent in situ hybridization with a signals ratio 3.30 and number per cell 7.25.  The patient's subsequent history is as detailed below.   PAST MEDICAL HISTORY: Past Medical History:  Diagnosis Date  . Allergy   . Anemia   . Arthritis   . Cancer [facility])    right breast cancer  . Cataracts, bilateral   . Chronic kidney disease    CKD  . Family history of breast cancer   . Family history of multiple myeloma   . Fatty liver disease, nonalcoholic 9/92/4268   [place] appears normal on follow up US 09/2019  . GERD (gastroesophageal reflux disease)   . History of kidney stones    x2 episodes  . Hyperlipidemia   .  Hypertension   . PONV (postoperative nausea and vomiting)   . Sleep apnea    test was dx, as mild- no further tx. required  . Type II or unspecified type diabetes  mellitus without mention of complication, not stated as uncontrolled     PAST SURGICAL HISTORY: Past Surgical History:  Procedure Laterality Date  . BREAST SURGERY  1999   Reduction Mammoplasties  . CHOLECYSTECTOMY  2003  . colonoscopsy  11/2007   neg- polyp removal with both procedures x2  . CYSTOSCOPY     surgery to extract stone x 1 with stent  . EUS N/A 07/26/2014   Procedure: UPPER ENDOSCOPIC ULTRASOUND (EUS) LINEAR;  Surgeon: Beryle Beams, MD;  Location: [facility];  Service: Endoscopy;  Laterality: N/A;  . 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 LYSIS OF ADHESION  10/10/2014   Procedure: LYSIS OF ADHESION;  Surgeon: TJackolyn Confer MD;  Location: [facility];  Service: General;;  . MASTECTOMY W/ SENTINEL NODE BIOPSY Bilateral 01/02/2020   Procedure: BILATERAL MASTECTOMY WITH RIGHT SENTINEL LYMPH NODE MAPPING;  Surgeon: CErroll Luna MD;  [place]  Service: General;  Laterality: Bilateral;  PEC BLOCK  . PORTACATH PLACEMENT Right 01/02/2020   Procedure: INSERTION PORT-A-CATH WITH ULTRASOUND GUIDANCE;  Surgeon: CErroll Luna MD;  [place]  Service: General;  Laterality: Right;  . TONSILLECTOMY     and adenoids removed age [id number] . VAGINAL HYSTERECTOMY  2002    FAMILY HISTORY: Family History  Problem Relation Age of Onset  . Breast cancer Mother 797 . Heart disease Father        smoker  . Diabetes type I Half-Sister   . Alzheimer's disease Maternal Grandmother   . Emphysema Paternal Grandfather        smoker  . Heart attack Paternal Grandfather   . Lung cancer Paternal Grandfather   . CAD Brother 519      stents  . Breast cancer Maternal Aunt        dx. in her late 767s . Breast cancer Cousin 556 . Cancer Maternal Uncle        dx. in his 653s tumor on the back of neck  . Breast cancer Paternal Aunt         dx. >50  . Multiple myeloma Maternal Aunt 92  . Multiple myeloma Paternal Aunt        dx. >50  . Breast cancer Cousin 543  Her father died at age 65952 Her mother died at age 162 She has 2 brothers and had 1 sister who is deceased. She reports breast cancer on her maternal side-- her mother at age 153 an aunt, and a cousin.   GYNECOLOGIC HISTORY:  No LMP recorded. Patient has had a hysterectomy. Menarche: 64years old Age at first live birth: 64years old GCochranP 2 LMP unsure Contraceptive: never used HRT never used  Hysterectomy? Yes, 2002 BSO? 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  HEALTH MAINTENANCE: Social History   Tobacco Use  . Smoking status: Never Smoker  . Smokeless tobacco: Never Used  Vaping Use  . Vaping Use: Never used  Substance Use Topics  . Alcohol use: No  . Drug use: No     Colonoscopy: 02/2019 (Dr. Collene Mares), repeat due 2025  PAP: none on file (s/p hysterectomy)  Bone density: never done   Allergies  Allergen Reactions  . Hydromorphone Rash    Arm reddened at IV med site  . Other Itching and Rash    NUTS CAUSE MOUTH TO [facility]    Current Outpatient Medications  Medication Sig Dispense Refill  . ALPRAZolam (XANAX) 1 MG tablet Take 1 tablet 1 hour prior to procedure.  Then take 1 tablet right before procedure if needed (Patient not taking: Reported on 12/24/2019) 2 tablet 0  . aspirin EC 81 MG tablet Take 81 mg by mouth daily. Swallow whole.    . cetirizine (ZYRTEC) 10 MG tablet Take 10 mg by mouth daily.    . Cholecalciferol (VITAMIN D3) 5000 UNITS TABS Take 5,000 Units by mouth daily.     Marland Kitchen glucose blood (CONTOUR  NEXT TEST) test strip Check blood sugar 1 time daily-DX-E11.9 100 each 12  . ibuprofen (ADVIL) 800 MG tablet Take 1 tablet (800 mg total) by mouth every 8 (eight) hours as needed. 30 tablet 0  . lidocaine-prilocaine (EMLA) cream Apply to affected area once 30 g 3  . losartan (COZAAR) 50 MG tablet TAKE 1/2-1 TAB DAILY FOR BLOOD PRESSURE AND TO PROTECT KIDNEYS. 30 tablet 5  . metFORMIN (GLUCOPHAGE XR) 500 MG 24 hr tablet Take 1 tablet (500 mg total) by mouth daily with breakfast. 30 tablet 11  . Omega-3 Fatty Acids (OMEGA 3 PO) Take 1 capsule by mouth 2 (two) times daily. (Patient not taking: Reported on 12/24/2019)    . OVER THE COUNTER MEDICATION Takes CoQ10 1 capsule daily (Patient not taking: Reported on 12/24/2019)    . oxyCODONE (OXY IR/ROXICODONE) 5 MG immediate release tablet Take 1 tablet (5 mg total) by mouth every 6 (six) hours as needed for severe pain. 15 tablet 0  . pantoprazole (PROTONIX) 40 MG tablet Take 40 mg by mouth daily.     . prochlorperazine (COMPAZINE) 10 MG tablet Take 1 tablet (10 mg total) by mouth every 6 (six) hours as needed (Nausea or vomiting). 30 tablet 1  . rosuvastatin (CRESTOR) 5 MG tablet Take 1 tablet daily for Cholesterol (Patient taking differently: Take 5 mg by mouth daily. ) 90 tablet 1  . sulfamethoxazole-trimethoprim (BACTRIM DS) 800-160 MG tablet Take 1 tablet by mouth 2 (two) times daily. (Patient not taking: Reported on 12/24/2019) 10 tablet 0   No current facility-administered medications for this visit.    OBJECTIVE: African-American woman who appears younger than stated age  64:   04/21/20 1301  BP: 127/64  Pulse: 87  Resp: 17  Temp: 97.9 F (36.6 C)  SpO2: 98%     Body mass index is 40.75 kg/m.   Wt Readings from Last 3 Encounters:  04/21/20 222 lb 12.8 oz (101.1 kg)  04/14/20 222 lb 4 oz (100.8 kg)  04/07/20 222 lb 8 oz (100.9 kg)     ECOG FS:1 - Symptomatic but completely ambulatory  Sclerae unicteric, EOMs intact Wearing a  mask No cervical or supraclavicular adenopathy Lungs no rales or rhonchi Heart regular rate and rhythm Abd soft, nontender, positive bowel sounds MSK no focal spinal tenderness, no upper extremity lymphedema  Neuro: nonfocal, well oriented, appropriate affect Breasts: Deferred; she is status post bilateral mastectomy   LAB RESULTS:  CMP     Component Value Date/Time   NA 138 04/14/2020 0905   K 4.3 04/14/2020 0905   CL 104 04/14/2020 0905   CO2 27 04/14/2020 0905   GLUCOSE 144 (H) 04/14/2020 0905   BUN 16 04/14/2020 0905   CREATININE 0.98 04/14/2020 0905   CREATININE 0.94 11/28/2019 1217   CREATININE 0.82 11/26/2019 1533   CALCIUM 9.3 04/14/2020 0905   PROT 6.7 04/14/2020 0905   ALBUMIN 3.8 04/14/2020 0905   AST 13 (L) 04/14/2020 0905   AST 10 (L) 11/28/2019 1217   ALT 13 04/14/2020 0905   ALT 16 11/28/2019 1217   ALKPHOS 60 04/14/2020 0905   BILITOT 0.4 04/14/2020 0905   BILITOT 0.6 11/28/2019 1217   GFRNONAA >60 04/14/2020 0905   GFRNONAA >60 11/28/2019 1217   GFRNONAA 76 11/26/2019 1533   GFRAA >60 03/03/2020 1343   GFRAA >60 11/28/2019 1217   GFRAA 88 11/26/2019 1533    Lab Results  Component Value Date   ALBUMINELP 3.9 08/23/2019   A1GS 0.4 (H) 08/23/2019   A2GS 0.9 08/23/2019   BETS 0.5 08/23/2019   BETA2SER 0.4 08/23/2019   GAMS 0.9 08/23/2019   SPEI  08/23/2019     Comment:     . Alpha-1 globulin increase noted. .     Lab Results  Component Value Date   WBC 7.3 04/21/2020   NEUTROABS 3.8 04/21/2020   HGB 10.1 (L) 04/21/2020   HCT 30.6 (L) 04/21/2020   MCV 95.0 04/21/2020   PLT 304 04/21/2020    No results found for: LABCA2  No components found for: [id number]  No results for input(s): INR in the last 168 hours.  No results found for: LABCA2  No results found for: NUU725  No results found for: DGU440  No results found for: HKV425  No results found for: CA2729  No components found for: HGQUANT  No results found for: CEA1 / No  results found for: CEA1   No results found for: AFPTUMOR  No results found for: CHROMOGRNA  No results found for: KPAFRELGTCHN, LAMBDASER, KAPLAMBRATIO (kappa/lambda light chains)  No results found for: HGBA, HGBA2QUANT, HGBFQUANT, HGBSQUAN (Hemoglobinopathy evaluation)   No results found for: LDH  Lab Results  Component Value Date   IRON 54 11/26/2019   TIBC 378 11/26/2019   IRONPCTSAT 14 (L) 11/26/2019   (Iron and TIBC)  Lab Results  Component Value Date   FERRITIN 91 05/21/2015    Urinalysis    Component Value Date/Time   COLORURINE YELLOW 11/26/2019 1533   APPEARANCEUR CLOUDY (A) 11/26/2019 1533   LABSPEC 1.019 11/26/2019 1533   PHURINE < OR = 5.0 11/26/2019 1533   GLUCOSEU 1+ (A) 11/26/2019 1533   HGBUR NEGATIVE 11/26/2019 1533   BILIRUBINUR NEG 11/22/2013 1031   BILIRUBINUR neg 10/28/2013 1159   KETONESUR NEGATIVE 11/26/2019 1533   PROTEINUR 2+ (A) 11/26/2019 1533   UROBILINOGEN 0.2 11/22/2013 1031   NITRITE NEGATIVE 08/13/2019 1359   LEUKOCYTESUR 1+ (A) 08/13/2019 1359    STUDIES: ECHOCARDIOGRAM COMPLETE  Result Date: 04/03/2020    ECHOCARDIOGRAM REPORT   Patient Name:   Tracy Garrett Date of Exam: 04/03/2020 Medical Rec #:  [id number]           Height:       62.0 in Accession #:    [id number]  Weight:       220.0 lb Date of Birth:  [birth year]          BSA:          1.991 m Patient Age:    64 years            BP:           146/81 mmHg Patient Gender: F                   HR:           88 bpm. Exam Location:  [address] Procedure: 2D Echo, 3D Echo, Cardiac Doppler, Color Doppler and Strain Analysis Indications:    C50.411 Breast cancer.  History:        Patient has prior history of Echocardiogram examinations, most                 recent 12/13/2019. Risk Factors:Hypertension, Dyslipidemia and                 Diabetes. Morbid obesity. COVID. Breast cancer. CKD.  Sonographer:    Jessee Avers, RDCS Referring Phys: [place]   1. Left ventricular ejection fraction, by estimation, is 55 to 60%. Left ventricular ejection fraction by 3D volume is 56 %. The left ventricle has normal function. The left ventricle has no regional wall motion abnormalities. Left ventricular diastolic  parameters were normal. The average left ventricular global longitudinal strain is -22.3 %. The global longitudinal strain is normal.  2. Right ventricular systolic function is normal. The right ventricular size is normal. There is normal pulmonary artery systolic pressure. The estimated right ventricular systolic pressure is 18.8 mmHg.  3. The mitral valve is grossly normal. Mild mitral valve regurgitation. No evidence of mitral stenosis.  4. The aortic valve is tricuspid. Aortic valve regurgitation is mild. No aortic stenosis is present.  5. The inferior vena cava is normal in size with greater than 50% respiratory variability, suggesting right atrial pressure of 3 mmHg. Comparison(s): No significant change from prior study. 12/13/19 EF 55-65%. GLS -20.1%. PA pressure 72mHg. FINDINGS  Left Ventricle: Left ventricular ejection fraction, by estimation, is 55 to 60%. Left ventricular ejection fraction by 3D volume is 56 %. The left ventricle has normal function. The left ventricle has no regional wall motion abnormalities. The average left ventricular global longitudinal strain is -22.3 %. The global longitudinal strain is normal. The left ventricular internal cavity size was normal in size. There is no left ventricular hypertrophy. Left ventricular diastolic parameters were normal. Right Ventricle: The right ventricular size is normal. No increase in right ventricular wall thickness. Right ventricular systolic function is normal. There is normal pulmonary artery systolic pressure. The tricuspid regurgitant velocity is 2.47 m/s, and  with an assumed right atrial pressure of 3 mmHg, the estimated right ventricular systolic pressure is 241.6mmHg. Left Atrium: Left  atrial size was normal in size. Right Atrium: Right atrial size was normal in size. Pericardium: Trivial pericardial effusion is present. Mitral Valve: The mitral valve is grossly normal. Mild mitral valve regurgitation. No evidence of mitral valve stenosis. Tricuspid Valve: The tricuspid valve is grossly normal. Tricuspid valve regurgitation is mild . No evidence of tricuspid stenosis. Aortic Valve: The aortic valve is tricuspid. Aortic valve regurgitation is mild. Aortic regurgitation PHT measures 368 msec. No aortic stenosis is present. Pulmonic Valve: The pulmonic valve was grossly normal. Pulmonic valve regurgitation is not visualized. No evidence of pulmonic  stenosis. Aorta: The aortic root and ascending aorta are structurally normal, with no evidence of dilitation. Venous: The right upper pulmonary vein is normal. The inferior vena cava is normal in size with greater than 50% respiratory variability, suggesting right atrial pressure of 3 mmHg. IAS/Shunts: The atrial septum is grossly normal.  LEFT VENTRICLE PLAX 2D LVIDd:         4.70 cm         Diastology LVIDs:         3.30 cm         LV e' medial:    6.89 cm/s LV PW:         0.80 cm         LV E/e' medial:  14.4 LV IVS:        0.70 cm         LV e' lateral:   9.32 cm/s LVOT diam:     2.10 cm         LV E/e' lateral: 10.6 LV SV:         81 LV SV Index:   41              2D LVOT Area:     3.46 cm        Longitudinal                                Strain                                2D Strain GLS  -18.3 %                                (A2C):                                2D Strain GLS  -24.6 %                                (A3C):                                2D Strain GLS  -24.1 %                                (A4C):                                2D Strain GLS  -22.3 %                                Avg:                                 3D Volume EF                                LV 3D EF:  Left                                              ventricular                                             ejection                                             fraction by                                             3D volume                                             is 56 %.                                 3D Volume EF:                                3D EF:        56 %                                LV EDV:       123 ml                                LV ESV:       54 ml                                LV SV:        69 ml RIGHT VENTRICLE RV Basal diam:  3.20 cm RV S prime:     11.80 cm/s TAPSE (M-mode): 2.2 cm LEFT ATRIUM             Index       RIGHT ATRIUM           Index LA diam:        3.70 cm 1.86 cm/m  RA Area:     11.10 cm LA Vol (A2C):   56.1 ml 28.16 ml/m RA Volume:   24.50 ml  12.31 ml/m LA Vol (A4C):   31.5 ml 15.82 ml/m LA Biplane Vol: 48.6 ml 24.41 ml/m  AORTIC VALVE LVOT Vmax:   108.00 cm/s LVOT Vmean:  68.200 cm/s LVOT VTI:    0.233 m AI PHT:      368 msec  AORTA Ao Root diam: 3.50 cm Ao Asc diam:  3.40 cm MITRAL VALVE  TRICUSPID VALVE                             TR Peak grad:   24.4 mmHg                             TR Vmax:        247.00 cm/s MV E velocity: 99.00 cm/s MV A velocity: 101.00 cm/s  SHUNTS MV E/A ratio:  0.98         Systemic VTI:  0.23 m                             Systemic Diam: 2.10 cm Eleonore Chiquito MD Electronically signed by Eleonore Chiquito MD Signature Date/Time: 04/03/2020/3:49:33 PM    Final      ELIGIBLE FOR AVAILABLE RESEARCH PROTOCOL: AET  ASSESSMENT: 64 y.o. [place] woman status post right breast upper outer quadrant biopsy 11/20/2019 for a clinical mT1b N0, stage IA invasive ductal carcinoma, grade 2 or 3, estrogen and progesterone receptor positive, HER-2 amplified, with an MIB-1 of 20%  (1) status post bilateral mastectomies 01/02/2020 showing  (a) on the right, an mpT2 pN0(i+), stage IB invasive ductal carcinoma, triple positive, with close but negative margins; 2 sentinel lymph nodes  removed  (b) on the left, no malignancy identified  (c) the patient is not planning on reconstruction  (2) adjuvant chemo immunotherapy consisting of Abraxane weekly x12 with trastuzumab completed 04/21/2020  (a) echo 12/13/2019 shows an ejection fraction in the 55-60% range  (b) echo 04/03/2020 shows an ejection fraction in the 55-60% range.  (3) trastuzumab to be continued to total 1 year  (4) adjuvant radiation to follow  (5) genetics testing 12/16/2019 through the [facility] Breast Cancer STAT panel + Common Hereditary Cancers panel found no deleterious mutations in ATM, BRCA1, BRCA2, CDH1, CHEK2, PALB2, PTEN, STK11 and TP53. The Common Hereditary Cancers Panel offered by [facility] includes sequencing and/or deletion duplication testing of the following 48 genes: APC, ATM, AXIN2, BARD1, BMPR1A, BRCA1, BRCA2, BRIP1, CDH1, CDK4, CDKN2A (p14ARF), CDKN2A (p16INK4a), CHEK2, CTNNA1, DICER1, EPCAM (Deletion/duplication testing only), GREM1 (promoter region deletion/duplication testing only), KIT, MEN1, MLH1, MSH2, MSH3, MSH6, MUTYH, NBN, NF1, NTHL1, PALB2, PDGFRA, PMS2, POLD1, POLE, PTEN, RAD50, RAD51C, RAD51D, RNF43, SDHB, SDHC, SDHD, SMAD4, SMARCA4. STK11, TP53, TSC1, TSC2, and VHL.  The following genes were evaluated for sequence changes only: SDHA and HOXB13 c.251G>A variant only.  (a) Two variants of uncertain significance (VUS) were detected - one in the BRIP1 gene called c.1735C>T and a second in the POLD1 gene called c.2861C>G.   (6) antiestrogens to follow-up   PLAN: Tracy Garrett completes the chemotherapy portion of her treatment today.  She has done remarkably well with this and has had no peripheral neuropathy with the paclitaxel.  We are switching the trastuzumab to every 3 weeks.  She will have her next dose 05/12/2020 and then continue until she returns a year from the original chemo start  She will see radiation oncology later this month likely will start adjuvant radiation sometime in  December  She will see my advanced practice provider late December and she will see me in February, after her next echocardiogram  I am delighted that she has done so well.  I have encouraged her to exercise more regularly.  Total encounter time 25 minutes.Sarajane Jews C. Tim Wilhide, MD 04/21/2020  1:17 PM Medical Oncology and Hematology [facility] [place], [place] [address] Tel. [phone]    Fax. [phone]   This document serves as a record of services personally performed by Lurline Del, MD. It was created on his behalf by Wilburn Mylar, a trained medical scribe. The creation of this record is based on the scribe's personal observations and the provider's statements to them.   I, Lurline Del MD, have reviewed the above documentation for accuracy and completeness, and I agree with the above.   *Total Encounter Time as defined by the Centers for Medicare and Medicaid Services includes, in addition to the face-to-face time of a patient visit (documented in the note above) non-face-to-face time: obtaining and reviewing outside history, ordering and reviewing medications, tests or procedures, care coordination (communications with other health care professionals or caregivers) and documentation in the medical record.

## 2020-04-21 ENCOUNTER — Telehealth: Payer: Self-pay | Admitting: Oncology

## 2020-04-21 ENCOUNTER — Inpatient Hospital Stay: Payer: 59

## 2020-04-21 ENCOUNTER — Other Ambulatory Visit: Payer: Self-pay

## 2020-04-21 ENCOUNTER — Inpatient Hospital Stay (HOSPITAL_BASED_OUTPATIENT_CLINIC_OR_DEPARTMENT_OTHER): Payer: 59 | Admitting: Oncology

## 2020-04-21 ENCOUNTER — Encounter: Payer: Self-pay | Admitting: *Deleted

## 2020-04-21 VITALS — BP 127/64 | HR 87 | Temp 97.9°F | Resp 17 | Ht 62.0 in | Wt 222.8 lb

## 2020-04-21 DIAGNOSIS — Z170 Estrogen receptor positive status [ER+]: Secondary | ICD-10-CM

## 2020-04-21 DIAGNOSIS — E559 Vitamin D deficiency, unspecified: Secondary | ICD-10-CM

## 2020-04-21 DIAGNOSIS — C50411 Malignant neoplasm of upper-outer quadrant of right female breast: Secondary | ICD-10-CM

## 2020-04-21 DIAGNOSIS — E1165 Type 2 diabetes mellitus with hyperglycemia: Secondary | ICD-10-CM

## 2020-04-21 DIAGNOSIS — Z95828 Presence of other vascular implants and grafts: Secondary | ICD-10-CM

## 2020-04-21 LAB — CBC WITH DIFFERENTIAL/PLATELET
Abs Immature Granulocytes: 0.08 10*3/uL — ABNORMAL HIGH (ref 0.00–0.07)
Basophils Absolute: 0 10*3/uL (ref 0.0–0.1)
Basophils Relative: 1 %
Eosinophils Absolute: 0.2 10*3/uL (ref 0.0–0.5)
Eosinophils Relative: 2 %
HCT: 30.6 % — ABNORMAL LOW (ref 36.0–46.0)
Hemoglobin: 10.1 g/dL — ABNORMAL LOW (ref 12.0–15.0)
Immature Granulocytes: 1 %
Lymphocytes Relative: 36 %
Lymphs Abs: 2.6 10*3/uL (ref 0.7–4.0)
MCH: 31.4 pg (ref 26.0–34.0)
MCHC: 33 g/dL (ref 30.0–36.0)
MCV: 95 fL (ref 80.0–100.0)
Monocytes Absolute: 0.6 10*3/uL (ref 0.1–1.0)
Monocytes Relative: 9 %
Neutro Abs: 3.8 10*3/uL (ref 1.7–7.7)
Neutrophils Relative %: 51 %
Platelets: 304 10*3/uL (ref 150–400)
RBC: 3.22 MIL/uL — ABNORMAL LOW (ref 3.87–5.11)
RDW: 18.7 % — ABNORMAL HIGH (ref 11.5–15.5)
WBC: 7.3 10*3/uL (ref 4.0–10.5)
nRBC: 0.4 % — ABNORMAL HIGH (ref 0.0–0.2)

## 2020-04-21 LAB — COMPREHENSIVE METABOLIC PANEL WITH GFR
ALT: 10 U/L (ref 0–44)
AST: 13 U/L — ABNORMAL LOW (ref 15–41)
Albumin: 3.6 g/dL (ref 3.5–5.0)
Alkaline Phosphatase: 56 U/L (ref 38–126)
Anion gap: 7 (ref 5–15)
BUN: 12 mg/dL (ref 8–23)
CO2: 27 mmol/L (ref 22–32)
Calcium: 8.7 mg/dL — ABNORMAL LOW (ref 8.9–10.3)
Chloride: 107 mmol/L (ref 98–111)
Creatinine, Ser: 0.93 mg/dL (ref 0.44–1.00)
GFR, Estimated: >60 mL/min
Glucose, Bld: 130 mg/dL — ABNORMAL HIGH (ref 70–99)
Potassium: 4.1 mmol/L (ref 3.5–5.1)
Sodium: 141 mmol/L (ref 135–145)
Total Bilirubin: 0.6 mg/dL (ref 0.3–1.2)
Total Protein: 6.5 g/dL (ref 6.5–8.1)

## 2020-04-21 MED ORDER — DIPHENHYDRAMINE HCL 12.5 MG/5ML PO ELIX
12.5000 mg | ORAL_SOLUTION | Freq: Once | ORAL | Status: AC
  Administered 2020-04-21: 12.5 mg via ORAL

## 2020-04-21 MED ORDER — PROCHLORPERAZINE MALEATE 10 MG PO TABS
10.0000 mg | ORAL_TABLET | Freq: Once | ORAL | Status: AC
  Administered 2020-04-21: 10 mg via ORAL

## 2020-04-21 MED ORDER — TRASTUZUMAB-ANNS CHEMO 150 MG IV SOLR
600.0000 mg | Freq: Once | INTRAVENOUS | Status: AC
  Administered 2020-04-21: 600 mg via INTRAVENOUS
  Filled 2020-04-21: qty 28.57

## 2020-04-21 MED ORDER — HEPARIN SOD (PORK) LOCK FLUSH 100 UNIT/ML IV SOLN
500.0000 [IU] | Freq: Once | INTRAVENOUS | Status: AC | PRN
  Administered 2020-04-21: 500 [IU]
  Filled 2020-04-21: qty 5

## 2020-04-21 MED ORDER — SODIUM CHLORIDE 0.9% FLUSH
10.0000 mL | INTRAVENOUS | Status: DC | PRN
  Administered 2020-04-21: 10 mL
  Filled 2020-04-21: qty 10

## 2020-04-21 MED ORDER — DIPHENHYDRAMINE HCL 12.5 MG/5ML PO ELIX
ORAL_SOLUTION | ORAL | Status: AC
  Filled 2020-04-21: qty 5

## 2020-04-21 MED ORDER — SODIUM CHLORIDE 0.9 % IV SOLN
Freq: Once | INTRAVENOUS | Status: AC
  Filled 2020-04-21: qty 250

## 2020-04-21 MED ORDER — PROCHLORPERAZINE MALEATE 10 MG PO TABS
ORAL_TABLET | ORAL | Status: AC
  Filled 2020-04-21: qty 1

## 2020-04-21 MED ORDER — ACETAMINOPHEN 325 MG PO TABS
650.0000 mg | ORAL_TABLET | Freq: Once | ORAL | Status: AC
  Administered 2020-04-21: 650 mg via ORAL

## 2020-04-21 MED ORDER — PACLITAXEL PROTEIN-BOUND CHEMO INJECTION 100 MG
100.0000 mg/m2 | Freq: Once | INTRAVENOUS | Status: AC
  Administered 2020-04-21: 200 mg via INTRAVENOUS
  Filled 2020-04-21: qty 40

## 2020-04-21 MED ORDER — ACETAMINOPHEN 325 MG PO TABS
ORAL_TABLET | ORAL | Status: AC
  Filled 2020-04-21: qty 2

## 2020-04-21 NOTE — Patient Instructions (Signed)
[facility] Discharge Instructions for Patients Receiving Chemotherapy  Today you received the following immunotherapy agent: Trastuzumab and  chemotherapy agent: Paclitaxel - protein bound (Abraxane).  To help prevent nausea and vomiting after your treatment, we encourage you to take your nausea medication as directed by your MD.   If you develop nausea and vomiting that is not controlled by your nausea medication, call the clinic.   BELOW ARE SYMPTOMS THAT SHOULD BE REPORTED IMMEDIATELY:  *FEVER GREATER THAN 100.5 F  *CHILLS WITH OR WITHOUT FEVER  NAUSEA AND VOMITING THAT IS NOT CONTROLLED WITH YOUR NAUSEA MEDICATION  *UNUSUAL SHORTNESS OF BREATH  *UNUSUAL BRUISING OR BLEEDING  TENDERNESS IN MOUTH AND THROAT WITH OR WITHOUT PRESENCE OF ULCERS  *URINARY PROBLEMS  *BOWEL PROBLEMS  UNUSUAL RASH Items with * indicate a potential emergency and should be followed up as soon as possible.  Feel free to call the clinic should you have any questions or concerns. The clinic phone number is [phone].  Please show the [place] at check-in to the Emergency Department and triage nurse.

## 2020-04-21 NOTE — Telephone Encounter (Signed)
Scheduled appts per 11/15 los. Gave pt a print out of AVS.

## 2020-04-21 NOTE — Addendum Note (Signed)
Addended by: Chauncey Cruel on: 04/21/2020 01:42 PM   Modules accepted: Orders

## 2020-04-28 ENCOUNTER — Ambulatory Visit: Payer: 59

## 2020-04-28 ENCOUNTER — Other Ambulatory Visit: Payer: 59

## 2020-04-30 ENCOUNTER — Other Ambulatory Visit: Payer: Self-pay | Admitting: Oncology

## 2020-05-06 ENCOUNTER — Ambulatory Visit
Admission: RE | Admit: 2020-05-06 | Discharge: 2020-05-06 | Disposition: A | Discharge status: Home / Self Care | Payer: 59 | Source: Ambulatory Visit | Attending: Radiation Oncology | Admitting: Radiation Oncology

## 2020-05-06 ENCOUNTER — Other Ambulatory Visit: Payer: Self-pay

## 2020-05-06 VITALS — BP 140/81 | HR 86 | Temp 97.9°F | Resp 20 | Ht 62.25 in | Wt 220.0 lb

## 2020-05-06 DIAGNOSIS — Z9013 Acquired absence of bilateral breasts and nipples: Secondary | ICD-10-CM | POA: Diagnosis not present

## 2020-05-06 DIAGNOSIS — Z170 Estrogen receptor positive status [ER+]: Secondary | ICD-10-CM

## 2020-05-06 DIAGNOSIS — C50412 Malignant neoplasm of upper-outer quadrant of left female breast: Secondary | ICD-10-CM | POA: Diagnosis not present

## 2020-05-06 DIAGNOSIS — Z510 Encounter for antineoplastic radiation therapy: Secondary | ICD-10-CM | POA: Diagnosis not present

## 2020-05-06 DIAGNOSIS — Z7982 Long term (current) use of aspirin: Secondary | ICD-10-CM | POA: Diagnosis not present

## 2020-05-06 DIAGNOSIS — C50411 Malignant neoplasm of upper-outer quadrant of right female breast: Secondary | ICD-10-CM

## 2020-05-06 DIAGNOSIS — Z79899 Other long term (current) drug therapy: Secondary | ICD-10-CM | POA: Insufficient documentation

## 2020-05-06 DIAGNOSIS — Z7984 Long term (current) use of oral hypoglycemic drugs: Secondary | ICD-10-CM | POA: Diagnosis not present

## 2020-05-06 NOTE — Progress Notes (Signed)
Radiation Oncology         [phone] ________________________________  Name: Tracy Garrett MRN: [id number]  Date: 05/06/2020  DOB: 22-Jun-1955  Follow-Up Visit Note  Outpatient  CC: Unk Pinto, MD  Magrinat, Virgie Dad, MD  Diagnosis:      ICD-10-CM   1. Malignant neoplasm of upper-outer quadrant of right breast in female, estrogen receptor positive ([place])  C50.411    Z17.0     Cancer Staging Malignant neoplasm of upper-outer quadrant of right breast in female, estrogen receptor positive ([place]) Staging form: Breast, AJCC 8th Edition - Clinical stage from 11/28/2019: Stage IA (cT1b, cN0, cM0, G3, ER+, PR+, HER2+) - Unsigned - Pathologic stage from 05/06/2020: Stage IA (pT2, pN0(i+), cM0, G2, ER+, PR+, HER2+) - Signed by Eppie Gibson, MD on 05/07/2020  CHIEF COMPLAINT: Here to discuss management of right breast cancer  Narrative:  The patient returns today for follow-up. She was seen in the multidisciplinary breast clinic on 11/28/2019, during which time she was considered to be a good candidate for breast conservation with chemotherapy preceding radiotherapy.    No pertinent imaging studies have been performed since consultation. Genetic testing was negative.  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 No malignancy was identified on the left.  Systemic therapy, if applicable, involved (dates and therapy as follows):   Adjuvant chemotherapy with Abraxane and Trastuzumab completed on 04/21/2020. Trastuzumab is to be continued for a total of one year.  Lymphedema issues, if any:  Patient denies   Pain issues, if any:  Patient denies  SAFETY ISSUES:  Prior radiation? No  Pacemaker/ICD? No  Possible current pregnancy? No--hysterectomy  Is the patient on methotrexate? 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  Meds: Current Outpatient Medications  Medication Sig Dispense Refill  . ALPRAZolam (XANAX) 1 MG tablet Take 1 tablet 1 hour prior to procedure.  Then take 1 tablet right before procedure if needed (Patient not taking: Reported on 12/24/2019) 2 tablet 0  . aspirin EC 81 MG tablet Take 81 mg by mouth daily. Swallow whole.    . cetirizine (ZYRTEC) 10 MG tablet Take 10 mg by mouth daily.    . Cholecalciferol (VITAMIN D3) 5000 UNITS TABS Take 5,000 Units by mouth daily.     Marland Kitchen glucose blood (CONTOUR NEXT TEST) test strip Check blood sugar 1 time daily-DX-E11.9 100 each 12  . ibuprofen (ADVIL) 800 MG tablet Take 1 tablet (800 mg total) by mouth every 8 (eight) hours as needed. 30 tablet 0  . losartan (COZAAR) 50 MG tablet TAKE 1/2-1 TAB DAILY FOR BLOOD PRESSURE AND TO PROTECT KIDNEYS. 30 tablet 5  . metFORMIN (GLUCOPHAGE XR) 500 MG 24 hr tablet Take 1 tablet (500 mg total) by mouth daily with breakfast. 30 tablet 11  . Omega-3 Fatty Acids (OMEGA 3 PO) Take 1 capsule by mouth 2 (two) times daily. (  Patient not taking: Reported on 12/24/2019)    . OVER THE COUNTER MEDICATION Takes CoQ10 1 capsule daily (Patient not taking: Reported on 12/24/2019)    . oxyCODONE (OXY IR/ROXICODONE) 5 MG immediate release tablet Take 1 tablet (5 mg total) by mouth every 6 (six) hours as needed for severe pain. 15 tablet 0  . pantoprazole (PROTONIX) 40 MG tablet Take 40 mg by mouth daily.     . rosuvastatin (CRESTOR) 5 MG  tablet Take 1 tablet daily for Cholesterol (Patient taking differently: Take 5 mg by mouth daily. ) 90 tablet 1  . sulfamethoxazole-trimethoprim (BACTRIM DS) 800-160 MG tablet Take 1 tablet by mouth 2 (two) times daily. (Patient not taking: Reported on 12/24/2019) 10 tablet 0   No current facility-administered medications for this encounter.    Physical Findings:  height is 5' 2.25" (1.581 m) and weight is 220 lb (99.8 kg). Her temperature is 97.9 F (36.6 C). Her blood pressure is 140/81 and her pulse is 86. Her respiration is 20 and oxygen saturation is 100%. .     General: Alert and oriented, in no acute distress HEENT: Head is normocephalic. Heart: Regular in rate and rhythm with no murmurs, rubs, or gallops. Chest: Clear to auscultation bilaterally, with no rhonchi, wheezes, or rales. Extremities: No cyanosis or edema in upper arms Musculoskeletal: symmetric strength and muscle tone throughout. ambulatory Neurologic: No obvious focalities. Speech is fluent.  Psychiatric: Judgment and insight are intact. Affect is appropriate. Breast exam reveals excellent healing of b/l mastectomy scars  Lab Findings: Lab Results  Component Value Date   WBC 7.3 04/21/2020   HGB 10.1 (L) 04/21/2020   HCT 30.6 (L) 04/21/2020   MCV 95.0 04/21/2020   PLT 304 04/21/2020    CMP     Component Value Date/Time   NA 141 04/21/2020 1245   K 4.1 04/21/2020 1245   CL 107 04/21/2020 1245   CO2 27 04/21/2020 1245   GLUCOSE 130 (H) 04/21/2020 1245   BUN 12 04/21/2020 1245   CREATININE 0.93 04/21/2020 1245   CREATININE 0.94 11/28/2019 1217   CREATININE 0.82 11/26/2019 1533   CALCIUM 8.7 (L) 04/21/2020 1245   PROT 6.5 04/21/2020 1245   ALBUMIN 3.6 04/21/2020 1245   AST 13 (L) 04/21/2020 1245   AST 10 (L) 11/28/2019 1217   ALT 10 04/21/2020 1245   ALT 16 11/28/2019 1217   ALKPHOS 56 04/21/2020 1245   BILITOT 0.6 04/21/2020 1245   BILITOT 0.6 11/28/2019 1217   GFRNONAA >60 04/21/2020 1245    GFRNONAA >60 11/28/2019 1217   GFRNONAA 76 11/26/2019 1533   GFRAA >60 03/03/2020 1343   GFRAA >60 11/28/2019 1217   GFRAA 88 11/26/2019 1533     Radiographic Findings: As above  Impression/Plan: Right breast cancer  We discussed adjuvant radiotherapy today.  I recommend radiation therapy to the right chest wall and regional nodes in order to reduce risk of locoregional recurrence by 2/3; I think RT is warranted due to the broadly close margin, multifocal disease, and scant tumor cells in a node, as well as minimal axillary surgery; that said, I consider her risk of LRR moderate rather than high. She understands treatment is optional. I reviewed the logistics, benefits, risks, and potential side effects of this treatment in detail. Risks may include but not necessary be limited to acute and late injury tissue in the radiation fields such as skin irritation (change in color/pigmentation, itching, dryness, pain, peeling). She may experience fatigue. We also discussed  possible risk of long term cosmetic changes or scar tissue. There is also a smaller risk for lung toxicity,  brachial plexopathy, lymphedema, musculoskeletal changes, rib fragility or induction of a second malignancy, late chronic non-healing soft tissue wound.    The patient asked good questions which I answered to her satisfaction. She is enthusiastic about proceeding with treatment. A consent form has been signed and placed in her chart.  A total of 5 medically necessary complex treatment devices will be fabricated and supervised by me: 4 fields with MLCs for custom blocks to protect heart, and lungs;  and, a Vac-lok. MORE COMPLEX DEVICES MAY BE MADE IN DOSIMETRY FOR FIELD IN FIELD BEAMS FOR DOSE HOMOGENEITY.  I have requested : 3D Simulation which is medically necessary to give adequate dose to at risk tissues while sparing lungs and heart.  I have requested a DVH of the following structures: lungs, heart, esophagus, cord.    The  patient will receive 50 Gy in 25 fractions to the chest wall (right) and 45Gy in 25 fractions to PAB/SCV with total of 4 fields.  This will be followed by a boost. Simulation will occur today.  We discussed measures to reduce the risk of infection during the COVID-19 pandemic.  She is due for her booster and desires it. We'll arrange for her to get it at the [facility] next week, after her immune system has just a bit more time to recover from her chemotherapy. She's taking precautions w/ masking, etc very conscientiously.   On date of service, in total, I spent 30 minutes on this encounter. Patient was seen in person.  _____________________________________   Eppie Gibson, MD  This document serves as a record of services personally performed by Eppie Gibson, MD. It was created on his behalf by Clerance Lav, a trained medical scribe. The creation of this record is based on the scribe's personal observations and the provider's statements to them. This document has been checked and approved by the attending provider.

## 2020-05-06 NOTE — Progress Notes (Signed)
Location of Breast Cancer: Malignant neoplasm of upper-outer quadrant of RIGHT breast, estrogen receptor positive   Histology per Pathology Report:  01/02/2020 FINAL MICROSCOPIC DIAGNOSIS:  A. BREAST, RIGHT, MASTECTOMY:  - Multifocal invasive ductal carcinoma, see comment.    - Mass #1 is grade 2, spanning 0.9 cm    - Mass #2 is grade 2, spanning >2 cm.  - Intermediate grade ductal carcinoma in situ.  - Invasive carcinoma is <1 mm from the posterior margin broadly.  - Margins are negative for in situ carcinoma.  - Biopsy sites.  - See oncology table.  B. BREAST, LEFT, MASTECTOMY:  - Complex sclerosing lesion.  - No malignancy identified.  C. LYMPH NODE, RIGHT AXILLARY, SENTINEL, BIOPSY:  - Isolated tumor cells in one of one lymph nodes (0/1).  D. LYMPH NODE, RIGHT AXILLARY, SENTINEL, BIOPSY:  - One of one lymph nodes negative for carcinoma (0/1)  Receptor Status: ER(95%), PR (25%), Her2-neu (positive), Ki-67(20%)  Did patient present with symptoms (if so, please note symptoms) or was this found on screening mammography?:  Patient had a routine screening mammography on 05/01/2020 showing a possible abnormality in the right breast. She underwent right diagnostic mammography with tomography [facility] on 05/09/2019 showing: breast density category A; probably-benign regional calcifications in the right breast at 11 o'clock. Short term follow up was recommended. She returned for follow up at [facility] on 11/13/2019. She underwent right diagnostic mammography and right breast ultrasonography showing: breast composition A; indeterminate calcifications in upper-outer right breast spanning 5 cm; 0.8 cm irregular mass in right breast at 9 o'clock  Past/Anticipated interventions by surgeon, if any: 01/02/2020 Dr. Marcello Moores [place] -Right simple mastectomy with right axillary sentinel lymph node mapping using methylene blue dye  -Risk reducing left simple mastectomy and port placement using ultrasound  and C-arm guidance  Past/Anticipated interventions by medical oncology, if any:  Under care of Dr. Sarajane Jews Magrinat 04/21/2020 (1) status post bilateral mastectomies 01/02/2020 showing             (a) on the right, an mpT2 pN0(i+), stage IB invasive ductal carcinoma, triple positive, with close but negative margins; 2 sentinel lymph nodes removed             (b) on the left, no malignancy identified             (c) the patient is not planning on reconstruction (2) adjuvant chemo immunotherapy consisting of Abraxane weekly x12 with trastuzumab completed 04/21/2020             (a) echo 12/13/2019 shows an ejection fraction in the 55-60% range             (b) echo 04/03/2020 shows an ejection fraction in the 55-60% range. (3) trastuzumab to be continued to total 1 year (4) adjuvant radiation to follow (5) genetics testing 12/16/2019 through the [facility] Breast Cancer STAT panel + Common Hereditary Cancers panel found no deleterious mutations in ATM, BRCA1, BRCA2, CDH1, CHEK2, PALB2, PTEN, STK11 and TP53. The Common Hereditary Cancers Panel offered by [facility] includes sequencing and/or deletion duplication testing of the following 48 genes: APC, ATM, AXIN2, BARD1, BMPR1A, BRCA1, BRCA2, BRIP1, CDH1, CDK4, CDKN2A (p14ARF), CDKN2A (p16INK4a), CHEK2, CTNNA1, DICER1, EPCAM (Deletion/duplication testing only), GREM1 (promoter region deletion/duplication testing only), KIT, MEN1, MLH1, MSH2, MSH3, MSH6, MUTYH, NBN, NF1, NTHL1, PALB2, PDGFRA, PMS2, POLD1, POLE, PTEN, RAD50, RAD51C, RAD51D, RNF43, SDHB, SDHC, SDHD, SMAD4, SMARCA4. STK11, TP53, TSC1, TSC2, and VHL.  The following genes were evaluated for sequence changes  only: SDHA and HOXB13 c.251G>A variant only.  (a) Two variants of uncertain significance (VUS) were detected - one in the BRIP1 gene called c.1735C>T and a second in the POLD1 gene called c.2861C>G.  (6) antiestrogens to follow-up  She will see my advanced practice provider late December and she  will see me in February, after her next echocardiogram  Lymphedema issues, if any:  Patient denies   Pain issues, if any:  Patient denies  SAFETY ISSUES:  Prior radiation? No  Pacemaker/ICD? No  Possible current pregnancy? No--hysterectomy  Is the patient on methotrexate? No  Current Complaints / other details:  Nothing of note

## 2020-05-07 ENCOUNTER — Encounter: Payer: Self-pay | Admitting: Radiation Oncology

## 2020-05-08 ENCOUNTER — Encounter: Payer: Self-pay | Admitting: *Deleted

## 2020-05-12 ENCOUNTER — Inpatient Hospital Stay: Payer: 59 | Attending: Oncology

## 2020-05-12 ENCOUNTER — Other Ambulatory Visit: Payer: Self-pay

## 2020-05-12 ENCOUNTER — Inpatient Hospital Stay: Payer: 59

## 2020-05-12 ENCOUNTER — Other Ambulatory Visit: Payer: Self-pay | Admitting: Oncology

## 2020-05-12 VITALS — BP 148/77 | HR 86 | Temp 98.6°F | Resp 18 | Ht 62.0 in | Wt 220.5 lb

## 2020-05-12 DIAGNOSIS — N189 Chronic kidney disease, unspecified: Secondary | ICD-10-CM | POA: Insufficient documentation

## 2020-05-12 DIAGNOSIS — I129 Hypertensive chronic kidney disease with stage 1 through stage 4 chronic kidney disease, or unspecified chronic kidney disease: Secondary | ICD-10-CM | POA: Insufficient documentation

## 2020-05-12 DIAGNOSIS — Z79899 Other long term (current) drug therapy: Secondary | ICD-10-CM | POA: Diagnosis not present

## 2020-05-12 DIAGNOSIS — Z7982 Long term (current) use of aspirin: Secondary | ICD-10-CM | POA: Insufficient documentation

## 2020-05-12 DIAGNOSIS — M1990 Unspecified osteoarthritis, unspecified site: Secondary | ICD-10-CM | POA: Diagnosis not present

## 2020-05-12 DIAGNOSIS — E119 Type 2 diabetes mellitus without complications: Secondary | ICD-10-CM | POA: Insufficient documentation

## 2020-05-12 DIAGNOSIS — Z23 Encounter for immunization: Secondary | ICD-10-CM | POA: Insufficient documentation

## 2020-05-12 DIAGNOSIS — K219 Gastro-esophageal reflux disease without esophagitis: Secondary | ICD-10-CM | POA: Diagnosis not present

## 2020-05-12 DIAGNOSIS — C50411 Malignant neoplasm of upper-outer quadrant of right female breast: Secondary | ICD-10-CM | POA: Insufficient documentation

## 2020-05-12 DIAGNOSIS — Z806 Family history of leukemia: Secondary | ICD-10-CM | POA: Insufficient documentation

## 2020-05-12 DIAGNOSIS — E1165 Type 2 diabetes mellitus with hyperglycemia: Secondary | ICD-10-CM

## 2020-05-12 DIAGNOSIS — Z510 Encounter for antineoplastic radiation therapy: Secondary | ICD-10-CM | POA: Insufficient documentation

## 2020-05-12 DIAGNOSIS — Z9013 Acquired absence of bilateral breasts and nipples: Secondary | ICD-10-CM | POA: Insufficient documentation

## 2020-05-12 DIAGNOSIS — E785 Hyperlipidemia, unspecified: Secondary | ICD-10-CM | POA: Insufficient documentation

## 2020-05-12 DIAGNOSIS — Z170 Estrogen receptor positive status [ER+]: Secondary | ICD-10-CM | POA: Diagnosis not present

## 2020-05-12 DIAGNOSIS — Z5111 Encounter for antineoplastic chemotherapy: Secondary | ICD-10-CM | POA: Diagnosis not present

## 2020-05-12 DIAGNOSIS — Z95828 Presence of other vascular implants and grafts: Secondary | ICD-10-CM

## 2020-05-12 DIAGNOSIS — Z803 Family history of malignant neoplasm of breast: Secondary | ICD-10-CM | POA: Diagnosis not present

## 2020-05-12 DIAGNOSIS — Z801 Family history of malignant neoplasm of trachea, bronchus and lung: Secondary | ICD-10-CM | POA: Diagnosis not present

## 2020-05-12 DIAGNOSIS — E559 Vitamin D deficiency, unspecified: Secondary | ICD-10-CM

## 2020-05-12 LAB — CBC WITH DIFFERENTIAL/PLATELET
Abs Immature Granulocytes: 0.07 10*3/uL (ref 0.00–0.07)
Basophils Absolute: 0 10*3/uL (ref 0.0–0.1)
Basophils Relative: 0 %
Eosinophils Absolute: 0.2 10*3/uL (ref 0.0–0.5)
Eosinophils Relative: 2 %
HCT: 33.1 % — ABNORMAL LOW (ref 36.0–46.0)
Hemoglobin: 10.7 g/dL — ABNORMAL LOW (ref 12.0–15.0)
Immature Granulocytes: 1 %
Lymphocytes Relative: 27 %
Lymphs Abs: 2.6 10*3/uL (ref 0.7–4.0)
MCH: 31.1 pg (ref 26.0–34.0)
MCHC: 32.3 g/dL (ref 30.0–36.0)
MCV: 96.2 fL (ref 80.0–100.0)
Monocytes Absolute: 0.9 10*3/uL (ref 0.1–1.0)
Monocytes Relative: 9 %
Neutro Abs: 5.9 10*3/uL (ref 1.7–7.7)
Neutrophils Relative %: 61 %
Platelets: 296 10*3/uL (ref 150–400)
RBC: 3.44 MIL/uL — ABNORMAL LOW (ref 3.87–5.11)
RDW: 17.8 % — ABNORMAL HIGH (ref 11.5–15.5)
WBC: 9.7 10*3/uL (ref 4.0–10.5)
nRBC: 0 % (ref 0.0–0.2)

## 2020-05-12 LAB — COMPREHENSIVE METABOLIC PANEL
ALT: 9 U/L (ref 0–44)
AST: 12 U/L — ABNORMAL LOW (ref 15–41)
Albumin: 3.5 g/dL (ref 3.5–5.0)
Alkaline Phosphatase: 58 U/L (ref 38–126)
Anion gap: 9 (ref 5–15)
BUN: 11 mg/dL (ref 8–23)
CO2: 26 mmol/L (ref 22–32)
Calcium: 8.9 mg/dL (ref 8.9–10.3)
Chloride: 107 mmol/L (ref 98–111)
Creatinine, Ser: 1 mg/dL (ref 0.44–1.00)
GFR, Estimated: >60 mL/min (ref >60)
Glucose, Bld: 137 mg/dL — ABNORMAL HIGH (ref 70–99)
Potassium: 4.1 mmol/L (ref 3.5–5.1)
Sodium: 142 mmol/L (ref 135–145)
Total Bilirubin: 0.4 mg/dL (ref 0.3–1.2)
Total Protein: 6.5 g/dL (ref 6.5–8.1)

## 2020-05-12 MED ORDER — DIPHENHYDRAMINE HCL 25 MG PO CAPS
ORAL_CAPSULE | ORAL | Status: AC
  Filled 2020-05-12: qty 1

## 2020-05-12 MED ORDER — TRASTUZUMAB-ANNS CHEMO 150 MG IV SOLR
600.0000 mg | Freq: Once | INTRAVENOUS | Status: AC
  Administered 2020-05-12: 600 mg via INTRAVENOUS
  Filled 2020-05-12: qty 28.57

## 2020-05-12 MED ORDER — HEPARIN SOD (PORK) LOCK FLUSH 100 UNIT/ML IV SOLN
500.0000 [IU] | Freq: Once | INTRAVENOUS | Status: AC | PRN
  Administered 2020-05-12: 500 [IU]
  Filled 2020-05-12: qty 5

## 2020-05-12 MED ORDER — DIPHENHYDRAMINE HCL 25 MG PO CAPS
25.0000 mg | ORAL_CAPSULE | Freq: Once | ORAL | Status: AC
  Administered 2020-05-12: 25 mg via ORAL

## 2020-05-12 MED ORDER — SODIUM CHLORIDE 0.9 % IV SOLN
Freq: Once | INTRAVENOUS | Status: AC
  Filled 2020-05-12: qty 250

## 2020-05-12 MED ORDER — ACETAMINOPHEN 325 MG PO TABS
ORAL_TABLET | ORAL | Status: AC
  Filled 2020-05-12: qty 2

## 2020-05-12 MED ORDER — SODIUM CHLORIDE 0.9% FLUSH
10.0000 mL | INTRAVENOUS | Status: DC | PRN
  Administered 2020-05-12: 10 mL
  Filled 2020-05-12: qty 10

## 2020-05-12 MED ORDER — ACETAMINOPHEN 325 MG PO TABS
650.0000 mg | ORAL_TABLET | Freq: Once | ORAL | Status: AC
  Administered 2020-05-12: 650 mg via ORAL

## 2020-05-12 NOTE — Progress Notes (Signed)
Patient requests diphenhydramine dose prior to trastuzumab to be liquid.  Orders changed in plan:  Add diphenhydramine 12.5 mg/5 ml oral liquid at 25 mg po x 1 as premedication D/C diphenhydramine 25 mg po capsule from plan  Above noted and completed.  Henreitta Leber, PharmD

## 2020-05-12 NOTE — Patient Instructions (Signed)
[facility] Discharge Instructions for Patients Receiving Chemotherapy  Today you received the following chemotherapy agents: Trastuzumab   To help prevent nausea and vomiting after your treatment, we encourage you to take your nausea medication  as prescribed.    If you develop nausea and vomiting that is not controlled by your nausea medication, call the clinic.   BELOW ARE SYMPTOMS THAT SHOULD BE REPORTED IMMEDIATELY:  *FEVER GREATER THAN 100.5 F  *CHILLS WITH OR WITHOUT FEVER  NAUSEA AND VOMITING THAT IS NOT CONTROLLED WITH YOUR NAUSEA MEDICATION  *UNUSUAL SHORTNESS OF BREATH  *UNUSUAL BRUISING OR BLEEDING  TENDERNESS IN MOUTH AND THROAT WITH OR WITHOUT PRESENCE OF ULCERS  *URINARY PROBLEMS  *BOWEL PROBLEMS  UNUSUAL RASH Items with * indicate a potential emergency and should be followed up as soon as possible.  Feel free to call the clinic should you have any questions or concerns. The clinic phone number is [phone].  Please show the CHEMO ALERT CARD at check-in to the Emergency Department and triage nurse.   

## 2020-05-12 NOTE — Progress Notes (Signed)
Patient discharged from the Cancer Center in stable condition and with no signs of acute distress.    

## 2020-05-14 ENCOUNTER — Inpatient Hospital Stay: Payer: 59

## 2020-05-14 ENCOUNTER — Other Ambulatory Visit: Payer: Self-pay

## 2020-05-14 ENCOUNTER — Ambulatory Visit
Admission: RE | Admit: 2020-05-14 | Discharge: 2020-05-14 | Disposition: A | Discharge status: Home / Self Care | Payer: 59 | Source: Ambulatory Visit | Attending: Radiation Oncology | Admitting: Radiation Oncology

## 2020-05-14 VITALS — BP 152/88 | HR 74

## 2020-05-14 VITALS — BP 152/91 | HR 74 | Temp 98.7°F | Resp 20

## 2020-05-14 DIAGNOSIS — C50411 Malignant neoplasm of upper-outer quadrant of right female breast: Secondary | ICD-10-CM | POA: Diagnosis not present

## 2020-05-14 DIAGNOSIS — Z23 Encounter for immunization: Secondary | ICD-10-CM

## 2020-05-14 DIAGNOSIS — Z510 Encounter for antineoplastic radiation therapy: Secondary | ICD-10-CM | POA: Diagnosis not present

## 2020-05-14 MED ORDER — INFLUENZA VAC SPLIT QUAD 0.5 ML IM SUSY
PREFILLED_SYRINGE | INTRAMUSCULAR | Status: AC
  Filled 2020-05-14: qty 0.5

## 2020-05-14 MED ORDER — INFLUENZA VAC SPLIT QUAD 0.5 ML IM SUSY
0.5000 mL | PREFILLED_SYRINGE | Freq: Once | INTRAMUSCULAR | Status: AC
  Administered 2020-05-14: 0.5 mL via INTRAMUSCULAR

## 2020-05-14 NOTE — Patient Instructions (Signed)
Influenza Virus Vaccine injection What is this medicine? INFLUENZA VIRUS VACCINE (in floo EN zuh VAHY ruhs vak SEEN) helps to reduce the risk of getting influenza also known as the flu. The vaccine only helps protect you against some strains of the flu. This medicine may be used for other purposes; ask your health care provider or pharmacist if you have questions. COMMON BRAND NAME(S): Afluria, Afluria Quadrivalent, Agriflu, Alfuria, FLUAD, Fluarix, Fluarix Quadrivalent, Flublok, Flublok Quadrivalent, FLUCELVAX, FLUCELVAX Quadrivalent, Flulaval, Flulaval Quadrivalent, Fluvirin, Fluzone, Fluzone High-Dose, Fluzone Intradermal, Fluzone Quadrivalent What should I tell my health care provider before I take this medicine? They need to know if you have any of these conditions:  bleeding disorder like hemophilia  fever or infection  Guillain-Barre syndrome or other neurological problems  immune system problems  infection with the human immunodeficiency virus (HIV) or AIDS  low blood platelet counts  multiple sclerosis  an unusual or allergic reaction to influenza virus vaccine, latex, other medicines, foods, dyes, or preservatives. Different brands of vaccines contain different allergens. Some may contain latex or eggs. Talk to your doctor about your allergies to make sure that you get the right vaccine.  pregnant or trying to get pregnant  breast-feeding How should I use this medicine? This vaccine is for injection into a muscle or under the skin. It is given by a health care professional. A copy of Vaccine Information Statements will be given before each vaccination. Read this sheet carefully each time. The sheet may change frequently. Talk to your healthcare provider to see which vaccines are right for you. Some vaccines should not be used in all age groups. Overdosage: If you think you have taken too much of this medicine contact a poison control center or emergency room at once. NOTE:  This medicine is only for you. Do not share this medicine with others. What if I miss a dose? This does not apply. What may interact with this medicine?  chemotherapy or radiation therapy  medicines that lower your immune system like etanercept, anakinra, infliximab, and adalimumab  medicines that treat or prevent blood clots like warfarin  phenytoin  steroid medicines like prednisone or cortisone  theophylline  vaccines This list may not describe all possible interactions. Give your health care provider a list of all the medicines, herbs, non-prescription drugs, or dietary supplements you use. Also tell them if you smoke, drink alcohol, or use illegal drugs. Some items may interact with your medicine. What should I watch for while using this medicine? Report any side effects that do not go away within 3 days to your doctor or health care professional. Call your health care provider if any unusual symptoms occur within 6 weeks of receiving this vaccine. You may still catch the flu, but the illness is not usually as bad. You cannot get the flu from the vaccine. The vaccine will not protect against colds or other illnesses that may cause fever. The vaccine is needed every year. What side effects may I notice from receiving this medicine? Side effects that you should report to your doctor or health care professional as soon as possible:  allergic reactions like skin rash, itching or hives, swelling of the face, lips, or tongue Side effects that usually do not require medical attention (report to your doctor or health care professional if they continue or are bothersome):  fever  headache  muscle aches and pains  pain, tenderness, redness, or swelling at the injection site  tiredness This list may not describe  all possible side effects. Call your doctor for medical advice about side effects. You may report side effects to FDA at 1-800-FDA-1088. Where should I keep my medicine? The  vaccine will be given by a health care professional in a clinic, pharmacy, doctor's office, or other health care setting. You will not be given vaccine doses to store at home. NOTE: This sheet is a summary. It may not cover all possible information. If you have questions about this medicine, talk to your doctor, pharmacist, or health care provider.  2020 Elsevier/Gold Standard (2018-04-18 08:45:43)

## 2020-05-14 NOTE — Progress Notes (Signed)
   Covid-19 Vaccination Clinic  Name:  Tracy Garrett    MRN: [id number] DOB: 02-02-1956  05/14/2020  [name] was observed post Covid-19 immunization for 15 minutes without incident. She was provided with Vaccine Information Sheet and instruction to access the V-Safe system.   [name] was instructed to call 911 with any severe reactions post vaccine: Marland Kitchen Difficulty breathing  . Swelling of face and throat  . A fast heartbeat  . A bad rash all over body  . Dizziness and weakness   Immunizations Administered    Name Date Dose VIS Date Route   Pfizer COVID-19 Vaccine 05/14/2020  9:16 AM 0.3 mL 03/26/2020 Intramuscular   Manufacturer: [place]   Lot: [id number]   [place]: [address]-7

## 2020-05-15 ENCOUNTER — Other Ambulatory Visit: Payer: Self-pay

## 2020-05-15 ENCOUNTER — Ambulatory Visit
Admission: RE | Admit: 2020-05-15 | Discharge: 2020-05-15 | Disposition: A | Discharge status: Home / Self Care | Payer: 59 | Source: Ambulatory Visit | Attending: Radiation Oncology | Admitting: Radiation Oncology

## 2020-05-15 DIAGNOSIS — Z510 Encounter for antineoplastic radiation therapy: Secondary | ICD-10-CM | POA: Diagnosis not present

## 2020-05-16 ENCOUNTER — Ambulatory Visit
Admission: RE | Admit: 2020-05-16 | Discharge: 2020-05-16 | Disposition: A | Discharge status: Home / Self Care | Payer: 59 | Source: Ambulatory Visit | Attending: Radiation Oncology | Admitting: Radiation Oncology

## 2020-05-16 DIAGNOSIS — Z510 Encounter for antineoplastic radiation therapy: Secondary | ICD-10-CM | POA: Diagnosis not present

## 2020-05-19 ENCOUNTER — Ambulatory Visit
Admission: RE | Admit: 2020-05-19 | Discharge: 2020-05-19 | Disposition: A | Discharge status: Home / Self Care | Payer: 59 | Source: Ambulatory Visit | Attending: Radiation Oncology | Admitting: Radiation Oncology

## 2020-05-19 DIAGNOSIS — Z170 Estrogen receptor positive status [ER+]: Secondary | ICD-10-CM

## 2020-05-19 DIAGNOSIS — Z510 Encounter for antineoplastic radiation therapy: Secondary | ICD-10-CM | POA: Diagnosis not present

## 2020-05-19 MED ORDER — ALRA NON-METALLIC DEODORANT (RAD-ONC)
1.0000 "application " | Freq: Once | TOPICAL | Status: AC
  Administered 2020-05-19: 1 via TOPICAL

## 2020-05-19 MED ORDER — RADIAPLEXRX EX GEL: Freq: Once | CUTANEOUS | Status: AC

## 2020-05-19 NOTE — Progress Notes (Signed)

## 2020-05-20 ENCOUNTER — Ambulatory Visit
Admission: RE | Admit: 2020-05-20 | Discharge: 2020-05-20 | Disposition: A | Discharge status: Home / Self Care | Payer: 59 | Source: Ambulatory Visit | Attending: Radiation Oncology | Admitting: Radiation Oncology

## 2020-05-20 DIAGNOSIS — Z510 Encounter for antineoplastic radiation therapy: Secondary | ICD-10-CM | POA: Diagnosis not present

## 2020-05-21 ENCOUNTER — Ambulatory Visit
Admission: RE | Admit: 2020-05-21 | Discharge: 2020-05-21 | Disposition: A | Discharge status: Home / Self Care | Payer: 59 | Source: Ambulatory Visit | Attending: Radiation Oncology | Admitting: Radiation Oncology

## 2020-05-21 DIAGNOSIS — Z510 Encounter for antineoplastic radiation therapy: Secondary | ICD-10-CM | POA: Diagnosis not present

## 2020-05-22 ENCOUNTER — Ambulatory Visit
Admission: RE | Admit: 2020-05-22 | Discharge: 2020-05-22 | Disposition: A | Discharge status: Home / Self Care | Payer: 59 | Source: Ambulatory Visit | Attending: Radiation Oncology | Admitting: Radiation Oncology

## 2020-05-22 ENCOUNTER — Other Ambulatory Visit: Payer: Self-pay

## 2020-05-22 DIAGNOSIS — Z510 Encounter for antineoplastic radiation therapy: Secondary | ICD-10-CM | POA: Diagnosis not present

## 2020-05-23 ENCOUNTER — Ambulatory Visit
Admission: RE | Admit: 2020-05-23 | Discharge: 2020-05-23 | Disposition: A | Discharge status: Home / Self Care | Payer: 59 | Source: Ambulatory Visit | Attending: Radiation Oncology | Admitting: Radiation Oncology

## 2020-05-23 DIAGNOSIS — Z510 Encounter for antineoplastic radiation therapy: Secondary | ICD-10-CM | POA: Diagnosis not present

## 2020-05-26 ENCOUNTER — Ambulatory Visit
Admission: RE | Admit: 2020-05-26 | Discharge: 2020-05-26 | Disposition: A | Discharge status: Home / Self Care | Payer: 59 | Source: Ambulatory Visit | Attending: Radiation Oncology | Admitting: Radiation Oncology

## 2020-05-26 ENCOUNTER — Other Ambulatory Visit: Payer: Self-pay

## 2020-05-26 ENCOUNTER — Telehealth: Payer: Self-pay

## 2020-05-26 DIAGNOSIS — Z510 Encounter for antineoplastic radiation therapy: Secondary | ICD-10-CM | POA: Diagnosis not present

## 2020-05-26 NOTE — Telephone Encounter (Signed)
Patient called to let staff know that her and her husband's rapid COVID tests were negative. She confirmed that she would bring a copy of her results to her XRT appointment tomorrow for documentation. Informed her I would update Dr. Isidore Moos and therapists in [facility]. No other needs identified at this time

## 2020-05-27 ENCOUNTER — Ambulatory Visit: Payer: 59 | Admitting: Adult Health

## 2020-05-27 ENCOUNTER — Ambulatory Visit
Admission: RE | Admit: 2020-05-27 | Discharge: 2020-05-27 | Disposition: A | Discharge status: Home / Self Care | Payer: 59 | Source: Ambulatory Visit | Attending: Radiation Oncology | Admitting: Radiation Oncology

## 2020-05-27 ENCOUNTER — Encounter: Payer: Self-pay | Admitting: Adult Health

## 2020-05-27 VITALS — BP 132/78 | HR 98 | Temp 97.7°F | Wt 220.0 lb

## 2020-05-27 DIAGNOSIS — Z510 Encounter for antineoplastic radiation therapy: Secondary | ICD-10-CM | POA: Diagnosis not present

## 2020-05-27 DIAGNOSIS — J019 Acute sinusitis, unspecified: Secondary | ICD-10-CM | POA: Diagnosis not present

## 2020-05-27 MED ORDER — PREDNISONE 20 MG PO TABS: ORAL_TABLET | ORAL | 0 refills | Status: DC

## 2020-05-27 MED ORDER — AZITHROMYCIN 250 MG PO TABS: ORAL_TABLET | ORAL | 1 refills | Status: AC

## 2020-05-27 MED ORDER — PROMETHAZINE-DM 6.25-15 MG/5ML PO SYRP: 5.0000 mL | ORAL_SOLUTION | Freq: Four times a day (QID) | ORAL | 1 refills | Status: DC | PRN

## 2020-05-27 MED ORDER — PANTOPRAZOLE SODIUM 40 MG PO TBEC
40.0000 mg | DELAYED_RELEASE_TABLET | Freq: Every day | ORAL | 0 refills | Status: DC
Start: 2020-05-27 — End: 2020-10-14

## 2020-05-27 NOTE — Progress Notes (Signed)
Assessment and Plan:  Zarai was seen today for acute visit.  Diagnoses and all orders for this visit:  Acute rhinosinusitis Discussed the importance of avoiding unnecessary antibiotic therapy. Benign exam- try conservative tx first, if not improving in 3 days start abx Suggested symptomatic OTC remedies. Nasal saline spray for congestion. Nasal steroids, rotate allergy pill, oral steroids offered Follow up as needed. -     predniSONE (DELTASONE) 20 MG tablet; 2 tablets daily for 3 days, 1 tablet daily for 4 days. -     promethazine-dextromethorphan (PROMETHAZINE-DM) 6.25-15 MG/5ML syrup; Take 5 mLs by mouth 4 (four) times daily as needed for cough. -     azithromycin (ZITHROMAX) 250 MG tablet; Take 2 tablets (500 mg) on  Day 1,  followed by 1 tablet (250 mg) once daily on Days 2 through 5.  Other orders -     pantoprazole (PROTONIX) 40 MG tablet; Take 1 tablet (40 mg total) by mouth daily.  Further disposition pending results of labs. Discussed med's effects and SE's.   Over 15 minutes of exam, counseling, chart review, and critical decision making was performed.   Future Appointments  Date Time Provider [place]  05/28/2020  7:45 AM [facility] [id number] [facility] None  05/29/2020  7:45 AM [facility] [id number] [facility] None  06/02/2020  7:45 AM [facility] [id number] [facility] None  06/03/2020  7:45 AM [facility] [id number] [facility] None  06/03/2020 12:45 PM [facility] LAB [facility] None  06/03/2020  1:00 PM [facility] [place] FLUSH [facility] None  06/03/2020  1:30 PM Causey, Charlestine Massed, NP [facility] None  06/03/2020  2:15 PM [facility] INFUSION [facility] None  06/04/2020  7:45 AM [facility] [id number] [facility] None  06/05/2020  7:45 AM [facility] [id number] [facility] None  06/09/2020  7:45 AM [facility] [id number] [facility] None  06/09/2020  8:00 AM Eppie Gibson, MD [facility] None  06/10/2020  7:45 AM [facility] [id number] [facility] None   06/11/2020  7:45 AM [facility] [id number] [facility] None  06/12/2020  7:45 AM [facility] [id number] [facility] None  06/13/2020  7:45 AM [facility] [id number] [facility] None  06/16/2020  7:45 AM [facility] [id number] [facility] None  06/16/2020  8:00 AM Eppie Gibson, MD [facility] None  06/17/2020  7:45 AM [facility] [id number] [facility] None  06/18/2020  7:45 AM [facility] [id number] [facility] None  06/19/2020  7:45 AM [facility] [id number] [facility] None  06/20/2020  7:45 AM [facility] [id number] [facility] None  06/23/2020  7:45 AM [facility] [id number] [facility] None  06/23/2020  9:30 AM [facility] LAB [facility] None  06/23/2020  9:45 AM [facility] FLUSH [facility] None  06/23/2020 10:30 AM [facility] INFUSION [facility] None  06/24/2020  7:45 AM [facility] [id number] [facility] None  06/25/2020  7:45 AM [facility] [id number] [facility] None  06/26/2020  7:45 AM [facility] [id number] [facility] None  07/14/2020  9:45 AM [facility] LAB [facility] None  07/14/2020 10:00 AM [facility] [place] FLUSH [facility] None  07/14/2020 10:30 AM Magrinat, Virgie Dad, MD [facility] None  07/14/2020 11:30 AM [facility] INFUSION [facility] None  08/04/2020  9:30 AM [facility] LAB [facility] None  08/04/2020  9:45 AM [facility] FLUSH [facility] None  08/04/2020 10:30 AM [facility] INFUSION [facility] None  08/25/2020  9:30 AM [facility] LAB [facility] None  08/25/2020  9:45 AM [facility] FLUSH [facility] None  08/25/2020 10:30 AM [facility] INFUSION [facility] None    ------------------------------------------------------------------------------------------------------------------   HPI BP 132/78   Pulse 98   Temp 97.7 F (36.5 C)   Wt 220 lb (99.8 kg)   SpO2 99%   BMI 40.24 kg/m   64 y.o.female with  well controlled T2DM, breast cancer s/p chemo, currently doing radiation presents for evaluation of 1 week of URI sx. Had negative covid 19 testing on 05/26/2020. 3/3, covid 19  vaccinated, pfizer.   She reports began with nasal congestion, sinus pressure, sinus HA and post-nasal drip (reports this is chronic/baseline). Denies sore throat, ear pressure, denies chest congestion, wheezing, dyspnea. She denies fever/chills.   Reports sx as stable, but persistent.   She reports has been taking alka-seltzer cold medication, helps some  She has year round allergies, taking zyrtec, also taking benadryl, no nasal sprays, having a lot of nasal irritation  She does check fasting glucose, 100-130s. Last A1C in the office was:  Lab Results  Component Value Date   HGBA1C 6.5 (H) 11/26/2019     Past Medical History:  Diagnosis Date  . Allergy   . Anemia   . Arthritis   . Cancer [facility])    right breast cancer  . Cataracts, bilateral   . Chronic kidney disease    CKD  . Family history of breast cancer   . Family history of multiple myeloma   . Fatty liver disease, nonalcoholic 1/60/1093   [place] appears normal on follow up US 09/2019  . GERD (gastroesophageal reflux disease)   . History of kidney stones    x2 episodes  . Hyperlipidemia   . Hypertension   . PONV (postoperative nausea and vomiting)   . Sleep apnea    test was dx, as mild- no further tx. required  . Type II or unspecified type diabetes mellitus without mention of complication, not stated as uncontrolled      Allergies  Allergen Reactions  . Pecan Nut (Diagnostic)     Other reaction(s): Unknown  . Hydromorphone Rash    Arm reddened at IV med site  . Other Itching and Rash    NUTS CAUSE MOUTH TO [facility]    Current Outpatient Medications on File Prior to Visit  Medication Sig  . aspirin EC 81 MG tablet Take 81 mg by mouth daily. Swallow whole.  . cetirizine (ZYRTEC) 10 MG tablet Take 10 mg by mouth daily.  . Cholecalciferol (VITAMIN D3) 5000 UNITS TABS Take 5,000 Units by mouth daily.   Marland Kitchen glucose blood (CONTOUR NEXT TEST) test strip Check blood sugar 1 time daily-[id number]  . losartan (COZAAR) 50 MG  tablet TAKE 1/2-1 TAB DAILY FOR BLOOD PRESSURE AND TO PROTECT KIDNEYS.  . metFORMIN (GLUCOPHAGE XR) 500 MG 24 hr tablet Take 1 tablet (500 mg total) by mouth daily with breakfast.  . OVER THE COUNTER MEDICATION Takes CoQ10 1 capsule daily  . rosuvastatin (CRESTOR) 5 MG tablet Take 1 tablet daily for Cholesterol (Patient taking differently: Take 5 mg by mouth daily.)  . ALPRAZolam (XANAX) 1 MG tablet Take 1 tablet 1 hour prior to procedure.  Then take 1 tablet right before procedure if needed (Patient taking differently: Take 1 tablet 1 hour prior to procedure.  Then take 1 tablet right before procedure if needed)  . ibuprofen (ADVIL) 800 MG tablet Take 1 tablet (800 mg total) by mouth every 8 (eight) hours as needed. (Patient not taking: Reported on 05/27/2020)  . Omega-3 Fatty Acids (OMEGA 3 PO) Take 1 capsule by mouth 2 (two) times daily. (Patient not taking: No sig reported)  . oxyCODONE (OXY IR/ROXICODONE) 5 MG immediate release tablet Take 1 tablet (5 mg total) by mouth every 6 (six) hours as needed for severe pain.  Marland Kitchen sulfamethoxazole-trimethoprim (BACTRIM DS) 800-160 MG tablet  Take 1 tablet by mouth 2 (two) times daily. (Patient not taking: No sig reported)  . [DISCONTINUED] prochlorperazine (COMPAZINE) 10 MG tablet Take 1 tablet (10 mg total) by mouth every 6 (six) hours as needed (Nausea or vomiting).   No current facility-administered medications on file prior to visit.    ROS: all negative except above.   Physical Exam:  BP 132/78   Pulse 98   Temp 97.7 F (36.5 C)   Wt 220 lb (99.8 kg)   SpO2 99%   BMI 40.24 kg/m   General Appearance: Well nourished, in no apparent distress. Eyes: PERRLA, EOMs, conjunctiva no swelling or erythema Sinuses: No Frontal/maxillary tenderness ENT/Mouth: Ext aud canals clear, TMs without erythema, bulging. No erythema, swelling, or exudate on post pharynx.  Tonsils not swollen or erythematous. Hearing normal.  Neck: Supple Respiratory:  Respiratory effort normal, BS equal bilaterally without rales, rhonchi, wheezing or stridor.  Cardio: RRR with no MRGs. Brisk peripheral pulses without edema.  Abdomen: Soft, + BS.  Non tender Lymphatics: Non tender without lymphadenopathy.  Musculoskeletal: normal gait.  Skin: Warm, dry without rashes, lesions, ecchymosis.  Neuro: Cranial nerves intact. Normal muscle tone, no cerebellar symptoms. Sensation intact.  Psych: Awake and oriented X 3, normal affect, Insight and Judgment appropriate.     Izora Ribas, NP 6:00 PM [facility]

## 2020-05-27 NOTE — Patient Instructions (Signed)
  Start prednisone and promethazdine DM  Hold off on zpak- start in 2-3 days if not improving with steroid and cough syrup      HOW TO TREAT VIRAL COUGH AND COLD SYMPTOMS:  -Symptoms usually last at least 1 week with the worst symptoms being around day 4.  - colds usually start with a sore throat and end with a cough, and the cough can take 2 weeks to get better.  -No antibiotics are needed for colds, flu, sore throats, cough, bronchitis UNLESS symptoms are longer than 7 days OR if you are getting better then get drastically worse.  -There are a lot of combination medications (Dayquil, Nyquil, Vicks 44, tyelnol cold and sinus, ETC). Please look at the ingredients on the back so that you are treating the correct symptoms and not doubling up on medications/ingredients.    Medicines you can use  Nasal congestion  Little Remedies saline spray (aerosol/mist)- can try this, it is in the kids section - pseudoephedrine (Sudafed)- behind the counter, do not use if you have high blood pressure, medicine that have -D in them.  - phenylephrine (Sudafed PE) -Dextormethorphan + chlorpheniramine (Coridcidin HBP)- okay if you have high blood pressure -Oxymetazoline (Afrin) nasal spray- LIMIT to 3 days -Saline nasal spray -Neti pot (used distilled or bottled water)  Ear pain/congestion  -pseudoephedrine (sudafed) - Nasonex/flonase nasal spray  Fever  -Acetaminophen (Tyelnol) -Ibuprofen (Advil, motrin, aleve)  Sore Throat  -Acetaminophen (Tyelnol) -Ibuprofen (Advil, motrin, aleve) -Drink a lot of water -Gargle with salt water - Rest your voice (don't talk) -Throat sprays -Cough drops  Body Aches  -Acetaminophen (Tyelnol) -Ibuprofen (Advil, motrin, aleve)  Headache  -Acetaminophen (Tyelnol) -Ibuprofen (Advil, motrin, aleve) - Exedrin, Exedrin Migraine  Allergy symptoms (cough, sneeze, runny nose, itchy eyes) -Claritin or loratadine cheapest but likely the weakest  -Zyrtec or  certizine at night because it can make you sleepy -The strongest is allegra or fexafinadine  Cheapest at [employer], [employer], [employer]  Cough  -Dextromethorphan (Delsym)- medicine that has DM in it -Guafenesin (Mucinex/Robitussin) - cough drops - drink lots of water  Chest Congestion  -Guafenesin (Mucinex/Robitussin)  Red Itchy Eyes  - Naphcon-A  Upset Stomach  - Bland diet (nothing spicy, greasy, fried, and high acid foods like tomatoes, oranges, berries) -OKAY- cereal, bread, soup, crackers, rice -Eat smaller more frequent meals -reduce caffeine, no alcohol -Loperamide (Imodium-AD) if diarrhea -Prevacid for heart burn  General health when sick  -Hydration -wash your hands frequently -keep surfaces clean -change pillow cases and sheets often -Get fresh air but do not exercise strenuously -Vitamin D, double up on it - Vitamin C -Zinc

## 2020-05-28 ENCOUNTER — Ambulatory Visit
Admission: RE | Admit: 2020-05-28 | Discharge: 2020-05-28 | Disposition: A | Discharge status: Home / Self Care | Payer: 59 | Source: Ambulatory Visit | Attending: Radiation Oncology | Admitting: Radiation Oncology

## 2020-05-28 ENCOUNTER — Other Ambulatory Visit: Payer: Self-pay | Admitting: Adult Health

## 2020-05-28 DIAGNOSIS — Z510 Encounter for antineoplastic radiation therapy: Secondary | ICD-10-CM | POA: Diagnosis not present

## 2020-05-29 ENCOUNTER — Ambulatory Visit
Admission: RE | Admit: 2020-05-29 | Discharge: 2020-05-29 | Disposition: A | Discharge status: Home / Self Care | Payer: 59 | Source: Ambulatory Visit | Attending: Radiation Oncology | Admitting: Radiation Oncology

## 2020-05-29 DIAGNOSIS — Z510 Encounter for antineoplastic radiation therapy: Secondary | ICD-10-CM | POA: Diagnosis not present

## 2020-06-02 ENCOUNTER — Ambulatory Visit
Admission: RE | Admit: 2020-06-02 | Discharge: 2020-06-02 | Disposition: A | Discharge status: Home / Self Care | Payer: 59 | Source: Ambulatory Visit | Attending: Radiation Oncology | Admitting: Radiation Oncology

## 2020-06-02 DIAGNOSIS — Z510 Encounter for antineoplastic radiation therapy: Secondary | ICD-10-CM | POA: Diagnosis not present

## 2020-06-03 ENCOUNTER — Inpatient Hospital Stay: Payer: 59

## 2020-06-03 ENCOUNTER — Encounter: Payer: Self-pay | Admitting: Adult Health

## 2020-06-03 ENCOUNTER — Inpatient Hospital Stay (HOSPITAL_BASED_OUTPATIENT_CLINIC_OR_DEPARTMENT_OTHER): Payer: 59 | Admitting: Adult Health

## 2020-06-03 ENCOUNTER — Ambulatory Visit: Payer: 59

## 2020-06-03 ENCOUNTER — Other Ambulatory Visit: Payer: Self-pay

## 2020-06-03 VITALS — BP 141/82 | HR 87 | Temp 98.3°F | Resp 18 | Ht 62.0 in | Wt 217.1 lb

## 2020-06-03 DIAGNOSIS — Z170 Estrogen receptor positive status [ER+]: Secondary | ICD-10-CM

## 2020-06-03 DIAGNOSIS — E1165 Type 2 diabetes mellitus with hyperglycemia: Secondary | ICD-10-CM

## 2020-06-03 DIAGNOSIS — Z95828 Presence of other vascular implants and grafts: Secondary | ICD-10-CM

## 2020-06-03 DIAGNOSIS — C50411 Malignant neoplasm of upper-outer quadrant of right female breast: Secondary | ICD-10-CM | POA: Diagnosis not present

## 2020-06-03 DIAGNOSIS — E559 Vitamin D deficiency, unspecified: Secondary | ICD-10-CM

## 2020-06-03 LAB — COMPREHENSIVE METABOLIC PANEL
ALT: 10 U/L (ref 0–44)
AST: 9 U/L — ABNORMAL LOW (ref 15–41)
Albumin: 3.6 g/dL (ref 3.5–5.0)
Alkaline Phosphatase: 58 U/L (ref 38–126)
Anion gap: 7 (ref 5–15)
BUN: 14 mg/dL (ref 8–23)
CO2: 30 mmol/L (ref 22–32)
Calcium: 9 mg/dL (ref 8.9–10.3)
Chloride: 102 mmol/L (ref 98–111)
Creatinine, Ser: 0.98 mg/dL (ref 0.44–1.00)
GFR, Estimated: >60 mL/min (ref >60)
Glucose, Bld: 127 mg/dL — ABNORMAL HIGH (ref 70–99)
Potassium: 3.6 mmol/L (ref 3.5–5.1)
Sodium: 139 mmol/L (ref 135–145)
Total Bilirubin: 0.7 mg/dL (ref 0.3–1.2)
Total Protein: 6.8 g/dL (ref 6.5–8.1)

## 2020-06-03 LAB — CBC WITH DIFFERENTIAL/PLATELET
Abs Immature Granulocytes: 0.04 10*3/uL (ref 0.00–0.07)
Basophils Absolute: 0 10*3/uL (ref 0.0–0.1)
Basophils Relative: 0 %
Eosinophils Absolute: 0.3 10*3/uL (ref 0.0–0.5)
Eosinophils Relative: 2 %
HCT: 35.1 % — ABNORMAL LOW (ref 36.0–46.0)
Hemoglobin: 11.6 g/dL — ABNORMAL LOW (ref 12.0–15.0)
Immature Granulocytes: 0 %
Lymphocytes Relative: 24 %
Lymphs Abs: 2.6 10*3/uL (ref 0.7–4.0)
MCH: 31 pg (ref 26.0–34.0)
MCHC: 33 g/dL (ref 30.0–36.0)
MCV: 93.9 fL (ref 80.0–100.0)
Monocytes Absolute: 1 10*3/uL (ref 0.1–1.0)
Monocytes Relative: 10 %
Neutro Abs: 6.7 10*3/uL (ref 1.7–7.7)
Neutrophils Relative %: 64 %
Platelets: 302 10*3/uL (ref 150–400)
RBC: 3.74 MIL/uL — ABNORMAL LOW (ref 3.87–5.11)
RDW: 15.5 % (ref 11.5–15.5)
WBC: 10.7 10*3/uL — ABNORMAL HIGH (ref 4.0–10.5)
nRBC: 0 % (ref 0.0–0.2)

## 2020-06-03 MED ORDER — DIPHENHYDRAMINE HCL 12.5 MG/5ML PO ELIX
ORAL_SOLUTION | ORAL | Status: AC
  Filled 2020-06-03: qty 5

## 2020-06-03 MED ORDER — DIPHENHYDRAMINE HCL 12.5 MG/5ML PO ELIX
25.0000 mg | ORAL_SOLUTION | Freq: Once | ORAL | Status: AC
  Administered 2020-06-03: 25 mg via ORAL

## 2020-06-03 MED ORDER — TRASTUZUMAB-ANNS CHEMO 150 MG IV SOLR
600.0000 mg | Freq: Once | INTRAVENOUS | Status: AC
  Administered 2020-06-03: 600 mg via INTRAVENOUS
  Filled 2020-06-03: qty 28.57

## 2020-06-03 MED ORDER — HEPARIN SOD (PORK) LOCK FLUSH 100 UNIT/ML IV SOLN
500.0000 [IU] | Freq: Once | INTRAVENOUS | Status: AC | PRN
  Administered 2020-06-03: 500 [IU]
  Filled 2020-06-03: qty 5

## 2020-06-03 MED ORDER — ACETAMINOPHEN 325 MG PO TABS
650.0000 mg | ORAL_TABLET | Freq: Once | ORAL | Status: AC
  Administered 2020-06-03: 650 mg via ORAL

## 2020-06-03 MED ORDER — SODIUM CHLORIDE 0.9 % IV SOLN
Freq: Once | INTRAVENOUS | Status: AC
  Filled 2020-06-03: qty 250

## 2020-06-03 MED ORDER — ACETAMINOPHEN 325 MG PO TABS
ORAL_TABLET | ORAL | Status: AC
  Filled 2020-06-03: qty 2

## 2020-06-03 MED ORDER — SODIUM CHLORIDE 0.9% FLUSH
10.0000 mL | INTRAVENOUS | Status: DC | PRN
  Administered 2020-06-03: 10 mL
  Filled 2020-06-03: qty 10

## 2020-06-03 NOTE — Patient Instructions (Signed)
Anastrozole tablets What is this medicine? ANASTROZOLE (an AS troe zole) is used to treat breast cancer in women who have gone through menopause. Some types of breast cancer depend on estrogen to grow, and this medicine can stop tumor growth by blocking estrogen production. This medicine may be used for other purposes; ask your health care provider or pharmacist if you have questions. COMMON BRAND NAME(S): Arimidex What should I tell my health care provider before I take this medicine? They need to know if you have any of these conditions:  bone problems  heart disease  high cholesterol  an unusual or allergic reaction to anastrozole, other medicines, foods, dyes, or preservatives  pregnant or trying to get pregnant  breast-feeding How should I use this medicine? Take this medicine by mouth with a glass of water. Follow the directions on the prescription label. You can take it with or without food. If it upsets your stomach, take it with food. Take your medicine at regular intervals. Do not take it more often than directed. Do not stop taking except on your doctor's advice. Talk to your pediatrician regarding the use of this medicine in children. Special care may be needed. Overdosage: If you think you have taken too much of this medicine contact a poison control center or emergency room at once. NOTE: This medicine is only for you. Do not share this medicine with others. What if I miss a dose? If you miss a dose, take it as soon as you can. If it is almost time for your next dose, take only that dose. Do not take double or extra doses. What may interact with this medicine? This medicine may interact with the following medications:  female hormones, like estrogens or progestins and birth control pills, patches, rings, or injections  tamoxifen This list may not describe all possible interactions. Give your health care provider a list of all the medicines, herbs, non-prescription drugs,  or dietary supplements you use. Also tell them if you smoke, drink alcohol, or use illegal drugs. Some items may interact with your medicine. What should I watch for while using this medicine? Visit your doctor or health care professional for regular checks on your progress. Let your doctor or health care professional know about any unusual vaginal bleeding. Do not become pregnant while taking this medicine or for at least 3 weeks after stopping it. Women should inform their doctor if they wish to become pregnant or think they might be pregnant. There is a potential for serious side effects to an unborn child. Talk to your health care professional or pharmacist for more information. Do not breast-feed an infant while taking this medicine or for 2 weeks after stopping it. This medicine may interfere with the ability to have a child. Talk with your doctor or health care professional if you are concerned about your fertility. Using this medicine for a long time may increase your risk of low bone mass. Talk to your doctor about bone health. You should make sure that you get enough calcium and vitamin D while you are taking this medicine. Discuss the foods you eat and the vitamins you take with your health care professional. What side effects may I notice from receiving this medicine? Side effects that you should report to your doctor or health care professional as soon as possible:  allergic reactions like skin rash, itching or hives, swelling of the face, lips, or tongue  signs and symptoms of a blood clot such as breathing problems;   changes in vision; chest pain; sudden headache; pain, swelling, warmth in the leg; trouble speaking; sudden numbness or weakness of the face, arm, or leg  signs and symptoms of infection like fever or chills; cough; sore throat; pain or trouble passing urine Side effects that usually do not require medical attention (report to your doctor or health care professional if they  continue or are bothersome):  bone pain  dizziness  hair loss  headache  hot flashes  joint pain  muscle pain  signs of decreased red blood cells - unusually weak or tired, feeling faint or lightheaded, falls  vaginal discharge, itching, or odor in women This list may not describe all possible side effects. Call your doctor for medical advice about side effects. You may report side effects to FDA at 1-800-FDA-1088. Where should I keep my medicine? Keep out of the reach of children. Store at room temperature between 20 and 25 degrees C (68 and 77 degrees F). Throw away any unused medicine after the expiration date. NOTE: This sheet is a summary. It may not cover all possible information. If you have questions about this medicine, talk to your doctor, pharmacist, or health care provider.  2020 Elsevier/Gold Standard (2017-06-06 14:56:51)  

## 2020-06-03 NOTE — Patient Instructions (Signed)
[facility] Discharge Instructions for Patients Receiving Chemotherapy  Today you received the following chemotherapy agents: Trastuzumab   To help prevent nausea and vomiting after your treatment, we encourage you to take your nausea medication  as prescribed.    If you develop nausea and vomiting that is not controlled by your nausea medication, call the clinic.   BELOW ARE SYMPTOMS THAT SHOULD BE REPORTED IMMEDIATELY:  *FEVER GREATER THAN 100.5 F  *CHILLS WITH OR WITHOUT FEVER  NAUSEA AND VOMITING THAT IS NOT CONTROLLED WITH YOUR NAUSEA MEDICATION  *UNUSUAL SHORTNESS OF BREATH  *UNUSUAL BRUISING OR BLEEDING  TENDERNESS IN MOUTH AND THROAT WITH OR WITHOUT PRESENCE OF ULCERS  *URINARY PROBLEMS  *BOWEL PROBLEMS  UNUSUAL RASH Items with * indicate a potential emergency and should be followed up as soon as possible.  Feel free to call the clinic should you have any questions or concerns. The clinic phone number is [phone].  Please show the CHEMO ALERT CARD at check-in to the Emergency Department and triage nurse.   

## 2020-06-03 NOTE — Progress Notes (Signed)
Tracy Garrett  [phone]) [phone] Fax:([phone]) [phone]     ID: AQUINNAH DEVIN DOB: [birth year]  MR#: [id number]  ZHY#:[id number]  Patient Care Team: Unk Pinto, MD as PCP - General (Internal Medicine) [name], RN as Oncology Nurse Navigator [name], RN as Oncology Nurse Navigator Erroll Luna, MD as Consulting Physician (General Surgery) Magrinat, Virgie Dad, MD as Consulting Physician (Oncology) Eppie Gibson, MD as Attending Physician (Radiation Oncology) Mcarthur Rossetti, MD as Consulting Physician (Orthopedic Surgery) Regal, Tamala Fothergill, DPM as Consulting Physician (Podiatry) Larey Dresser, MD as Consulting Physician (Cardiology) Scot Dock, NP OTHER MD:  CHIEF COMPLAINT: triple positive breast cancer (s/p bilateral mastectomies)  CURRENT TREATMENT: Adjuvant  Immunotherapy and radiation therapy   INTERVAL HISTORY: Tracy Garrett returns today for follow up and treatment of her triple positive breast cancer accompanied by her husband.   She is here today for Trastuzumab every 3 weeks.  She is undergoing adjuvant radiation therapy and notes that she is tolerating this well.  Her skin is mildly erythematous.  She dneies any skin breakdown or tenderness from the radiation therapy.    Her most recent echocardiogram on 04/03/2020 showed an ejection fraction of 55-60%. This is stable from prior in 12/2019.   REVIEW OF SYSTEMS: Tracy Garrett is doing well today.  She denies any new issues such as fever, chills, chest pain, palpitations, cough, bowel/bladder changes, headaches, nausea, vomiting, or any other concerns.  She is increasing her activities.  A detailed ROS was otherwise non contributory today.      COVID 19 VACCINATION STATUS: Receive Pacolet x2, most recent dose March 2021   HISTORY OF CURRENT ILLNESS: From the original intake note:  Tracy Garrett had routine screening mammography on 05/01/2020 showing a possible  abnormality in the right breast. She underwent right diagnostic mammography with tomography at [facility] on 05/09/2019 showing: breast density category A; probably-benign regional calcifications in the right breast at 11 o'clock. Short term follow up was recommended.   She returned for follow up at [facility] on 11/13/2019. She underwent right diagnostic mammography and right breast ultrasonography showing: breast composition A; indeterminate calcifications in upper-outer right breast spanning 5 cm; 0.8 cm irregular mass in right breast at 9 o'clock.  Accordingly on 11/20/2019 she proceeded to biopsy of the right breast areas in question. The pathology from this procedure ([id number]) showed:  1. Right breast, UOQ  - foci of invasive and in situ mammary carcinoma, grade 2-3, e-cadherin positive.  2. Right breast, 9 o'clock  - invasive mammary carcinoma, grade 2-3, e-cadherin positive  - Prognostic indicators significant for: estrogen receptor, 95% positive and progesterone receptor, 25% positive, both with strong staining intensity. Proliferation marker Ki67 at 20%. HER2 equivocal by immunohistochemistry (2+), but positive by fluorescent in situ hybridization with a signals ratio 3.30 and number per cell 7.25.  The patient's subsequent history is as detailed below.   PAST MEDICAL HISTORY: Past Medical History:  Diagnosis Date  . Allergy   . Anemia   . Arthritis   . Cancer [facility])    right breast cancer  . Cataracts, bilateral   . Chronic kidney disease    CKD  . Family history of breast cancer   . Family history of multiple myeloma   . Fatty liver disease, nonalcoholic 4/40/1027   [place] appears normal on follow up US 09/2019  . GERD (gastroesophageal reflux disease)   . History of kidney stones    x2 episodes  . Hyperlipidemia   .  Hypertension   . PONV (postoperative nausea and vomiting)   . Sleep apnea    test was dx, as mild- no further tx. required  . Type II or unspecified type diabetes  mellitus without mention of complication, not stated as uncontrolled     PAST SURGICAL HISTORY: Past Surgical History:  Procedure Laterality Date  . BREAST SURGERY  1999   Reduction Mammoplasties  . CHOLECYSTECTOMY  2003  . colonoscopsy  11/2007   neg- polyp removal with both procedures x2  . CYSTOSCOPY     surgery to extract stone x 1 with stent  . EUS N/A 07/26/2014   Procedure: UPPER ENDOSCOPIC ULTRASOUND (EUS) LINEAR;  Surgeon: Beryle Beams, MD;  Location: [facility];  Service: Endoscopy;  Laterality: N/A;  . 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 LYSIS OF ADHESION  10/10/2014   Procedure: LYSIS OF ADHESION;  Surgeon: TJackolyn Confer MD;  Location: [facility];  Service: General;;  . MASTECTOMY W/ SENTINEL NODE BIOPSY Bilateral 01/02/2020   Procedure: BILATERAL MASTECTOMY WITH RIGHT SENTINEL LYMPH NODE MAPPING;  Surgeon: CErroll Luna MD;  [place]  Service: General;  Laterality: Bilateral;  PEC BLOCK  . PORTACATH PLACEMENT Right 01/02/2020   Procedure: INSERTION PORT-A-CATH WITH ULTRASOUND GUIDANCE;  Surgeon: CErroll Luna MD;  [place]  Service: General;  Laterality: Right;  . TONSILLECTOMY     and adenoids removed age [id number] . VAGINAL HYSTERECTOMY  2002    FAMILY HISTORY: Family History  Problem Relation Age of Onset  . Breast cancer Mother 797 . Heart disease Father        smoker  . Diabetes type I Half-Sister   . Alzheimer's disease Maternal Grandmother   . Emphysema Paternal Grandfather        smoker  . Heart attack Paternal Grandfather   . Lung cancer Paternal Grandfather   . CAD Brother 519      stents  . Breast cancer Maternal Aunt        dx. in her late 767s . Breast cancer Cousin 556 . Cancer Maternal Uncle        dx. in his 653s tumor on the back of neck  . Breast cancer Paternal Aunt         dx. >50  . Multiple myeloma Maternal Aunt 92  . Multiple myeloma Paternal Aunt        dx. >50  . Breast cancer Cousin 543  Her father died at age 65952 Her mother died at age 162 She has 2 brothers and had 1 sister who is deceased. She reports breast cancer on her maternal side-- her mother at age 153 an aunt, and a cousin.   GYNECOLOGIC HISTORY:  No LMP recorded. Patient has had a hysterectomy. Menarche: 64years old Age at first live birth: 64years old GCochranP 2 LMP unsure Contraceptive: never used HRT never used  Hysterectomy? Yes, 2002 BSO? 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  HEALTH MAINTENANCE: Social History   Tobacco Use  . Smoking status: Never Smoker  . Smokeless tobacco: Never Used  Vaping Use  . Vaping Use: Never used  Substance Use Topics  . Alcohol use: No  . Drug use: No     Colonoscopy: 02/2019 (Dr. Loreta Ave), repeat due 2025  PAP: none on file (s/p hysterectomy)  Bone density: never done   Allergies  Allergen Reactions  . Pecan Nut (Diagnostic)     Other reaction(s): Unknown  . Hydromorphone Rash    Arm reddened at IV med site  . Other Itching and Rash    NUTS CAUSE MOUTH TO [facility]    Current Outpatient Medications  Medication Sig Dispense Refill  . aspirin EC 81 MG tablet Take 81 mg by mouth daily. Swallow whole.    . cetirizine (ZYRTEC) 10 MG tablet Take 10 mg by mouth daily.    . Cholecalciferol (VITAMIN D3) 5000 UNITS TABS Take 5,000 Units by mouth daily.     Marland Kitchen glucose blood (CONTOUR NEXT TEST) test strip Check blood sugar 1 time daily-DX-E11.9 100 each 12  . losartan (COZAAR) 50 MG tablet TAKE 1/2-1 TAB  DAILY FOR BLOOD PRESSURE AND TO PROTECT KIDNEYS. 30 tablet 5  . metFORMIN (GLUCOPHAGE XR) 500 MG 24 hr tablet Take 1 tablet (500 mg total) by mouth daily with breakfast. 30 tablet 11  . Omega-3 Fatty Acids (OMEGA 3 PO) Take 1 capsule by mouth 2 (two) times daily. (Patient not taking: No sig reported)    . pantoprazole (PROTONIX) 40 MG tablet Take 1 tablet (40 mg total) by mouth daily. 90 tablet 0  . promethazine-dextromethorphan (PROMETHAZINE-DM) 6.25-15 MG/5ML syrup Take 5 mLs by mouth 4 (four) times daily as needed for cough. 240 mL 1  . rosuvastatin (CRESTOR) 5 MG tablet Take     1 tablet     Daily      for Cholesterol                     TAKE 1 TABLET BY MOUTH EVERY DAY FOR CHOLESTEROL 90 tablet 0   No current facility-administered medications for this visit.   Facility-Administered Medications Ordered in Other Visits  Medication Dose Route Frequency Provider Last Rate Last Admin  . sodium chloride flush (NS) 0.9 % injection 10 mL  10 mL Intracatheter PRN Magrinat, Valentino Hue, MD   10 mL at 06/03/20 1556    OBJECTIVE:   Vitals:   06/03/20 1348  BP: (!) 141/82  Pulse: 87  Resp: 18  Temp: 98.3 F (36.8 C)  SpO2: 100%     Body mass index is 39.71 kg/m.   Wt Readings from Last 3 Encounters:  06/03/20 217 lb 1.6 oz (98.5 kg)  05/27/20 220 lb (99.8 kg)  05/12/20 220 lb 8 oz (100 kg)     ECOG FS:1 - Symptomatic but completely ambulatory GENERAL: Patient is a well appearing female in no acute distress HEENT:  Sclerae anicteric.  Oropharynx clear and moist. No ulcerations or evidence of oropharyngeal candidiasis. Neck is supple.  NODES:  No cervical, supraclavicular, or axillary lymphadenopathy palpated.  BREAST EXAM:  Deferred. LUNGS:  Clear to auscultation bilaterally.  No wheezes or rhonchi. HEART:  Regular rate and rhythm. No murmur appreciated. ABDOMEN:  Soft, nontender.  Positive, normoactive bowel sounds. No organomegaly palpated. MSK:  No focal spinal tenderness to palpation.  Full range of motion bilaterally in the upper extremities. EXTREMITIES:  No peripheral edema.   SKIN:  Clear  with no obvious rashes or skin changes. No nail dyscrasia. NEURO:  Nonfocal. Well oriented.  Appropriate affect.    LAB RESULTS:  CMP     Component Value Date/Time   NA 139 06/03/2020 1325   K 3.6 06/03/2020 1325   CL 102 06/03/2020 1325   CO2 30 06/03/2020 1325   GLUCOSE 127 (H) 06/03/2020 1325   BUN 14 06/03/2020 1325   CREATININE 0.98 06/03/2020 1325   CREATININE 0.94 11/28/2019 1217   CREATININE 0.82 11/26/2019 1533   CALCIUM 9.0 06/03/2020 1325   PROT 6.8 06/03/2020 1325   ALBUMIN 3.6 06/03/2020 1325   AST 9 (L) 06/03/2020 1325   AST 10 (L) 11/28/2019 1217   ALT 10 06/03/2020 1325   ALT 16 11/28/2019 1217   ALKPHOS 58 06/03/2020 1325   BILITOT 0.7 06/03/2020 1325   BILITOT 0.6 11/28/2019 1217   GFRNONAA >60 06/03/2020 1325   GFRNONAA >60 11/28/2019 1217   GFRNONAA 76 11/26/2019 1533   GFRAA >60 03/03/2020 1343   GFRAA >60 11/28/2019 1217   GFRAA 88 11/26/2019 1533    Lab Results  Component Value Date   ALBUMINELP 3.9 08/23/2019   A1GS 0.4 (H) 08/23/2019   A2GS 0.9 08/23/2019   BETS 0.5 08/23/2019   BETA2SER 0.4 08/23/2019   GAMS 0.9 08/23/2019   SPEI  08/23/2019     Comment:     . Alpha-1 globulin increase noted. .     Lab Results  Component Value Date   WBC 10.7 (H) 06/03/2020   NEUTROABS 6.7 06/03/2020   HGB 11.6 (L) 06/03/2020   HCT 35.1 (L) 06/03/2020   MCV 93.9 06/03/2020   PLT 302 06/03/2020    No results found for: LABCA2  No components found for: [id number]  No results for input(s): INR in the last 168 hours.  No results found for: LABCA2  No results found for: XIP382  No results found for: NKN397  No results found for: QBH419  No results found for: CA2729  No components found for: HGQUANT  No results found for: CEA1 / No results found for: CEA1   No results found for: AFPTUMOR  No results found for:  CHROMOGRNA  No results found for: KPAFRELGTCHN, LAMBDASER, KAPLAMBRATIO (kappa/lambda light chains)  No results found for: HGBA, HGBA2QUANT, HGBFQUANT, HGBSQUAN (Hemoglobinopathy evaluation)   No results found for: LDH  Lab Results  Component Value Date   IRON 54 11/26/2019   TIBC 378 11/26/2019   IRONPCTSAT 14 (L) 11/26/2019   (Iron and TIBC)  Lab Results  Component Value Date   FERRITIN 91 05/21/2015    Urinalysis    Component Value Date/Time   COLORURINE YELLOW 11/26/2019 1533   APPEARANCEUR CLOUDY (A) 11/26/2019 1533   LABSPEC 1.019 11/26/2019 1533   PHURINE < OR = 5.0 11/26/2019 1533   GLUCOSEU 1+ (A) 11/26/2019 1533   HGBUR NEGATIVE 11/26/2019 1533   BILIRUBINUR NEG 11/22/2013 1031   BILIRUBINUR neg 10/28/2013 1159   KETONESUR NEGATIVE 11/26/2019 1533   PROTEINUR 2+ (A) 11/26/2019 1533   UROBILINOGEN 0.2 11/22/2013 1031   NITRITE NEGATIVE 08/13/2019 1359   LEUKOCYTESUR 1+ (A) 08/13/2019 1359    STUDIES: No results found.   ELIGIBLE FOR AVAILABLE RESEARCH PROTOCOL: AET  ASSESSMENT: 64 y.o. [place] woman status post right breast upper outer quadrant biopsy 11/20/2019 for a clinical mT1b N0, stage IA invasive ductal carcinoma, grade 2 or 3, estrogen and progesterone receptor positive, HER-2 amplified, with an MIB-1 of 20%  (1) status post bilateral  mastectomies 01/02/2020 showing  (a) on the right, an mpT2 pN0(i+), stage IB invasive ductal carcinoma, triple positive, with close but negative margins; 2 sentinel lymph nodes removed  (b) on the left, no malignancy identified  (c) the patient is not planning on reconstruction  (2) adjuvant chemo immunotherapy consisting of Abraxane weekly x12 with trastuzumab completed 04/21/2020  (a) echo 12/13/2019 shows an ejection fraction in the 55-60% range  (b) echo 04/03/2020 shows an ejection fraction in the 55-60% range.  (3) trastuzumab to be continued to total 1 year  (4) adjuvant radiation to follow  (5)  genetics testing 12/16/2019 through the [facility] Breast Cancer STAT panel + Common Hereditary Cancers panel found no deleterious mutations in ATM, BRCA1, BRCA2, CDH1, CHEK2, PALB2, PTEN, STK11 and TP53. The Common Hereditary Cancers Panel offered by [facility] includes sequencing and/or deletion duplication testing of the following 48 genes: APC, ATM, AXIN2, BARD1, BMPR1A, BRCA1, BRCA2, BRIP1, CDH1, CDK4, CDKN2A (p14ARF), CDKN2A (p16INK4a), CHEK2, CTNNA1, DICER1, EPCAM (Deletion/duplication testing only), GREM1 (promoter region deletion/duplication testing only), KIT, MEN1, MLH1, MSH2, MSH3, MSH6, MUTYH, NBN, NF1, NTHL1, PALB2, PDGFRA, PMS2, POLD1, POLE, PTEN, RAD50, RAD51C, RAD51D, RNF43, SDHB, SDHC, SDHD, SMAD4, SMARCA4. STK11, TP53, TSC1, TSC2, and VHL.  The following genes were evaluated for sequence changes only: SDHA and HOXB13 c.251G>A variant only.  (a) Two variants of uncertain significance (VUS) were detected - one in the BRIP1 gene called c.1735C>T and a second in the POLD1 gene called c.2861C>G.   (6) antiestrogens to follow-up   PLAN: Tracy Garrett is doing quite well today.  She continues on adjuvant radiation therapy and Trastuzumab.  She is tolerating these treatments quite well.  I placed orders for her next echcoardiogram which is due in January, 2022.    I reviewed the fact that following her adjuvant radiation therapy she will start anti estrogen therapy.  She will likely begin Anastrozole daily.  I went ahead and gave her information on this treatment and placed orders for her to undergo bone density testing to be completed hopefully prior to her appt with Dr. Jana Hakim in 6 weeks to discuss Anastrozole.    She will return every three weeks for her trastuzumab, we will see her back for f/u in 6 weeks.  She knows to call for any questions that may arise between now and her next appointment.  We are happy to see her sooner if needed.  Total encounter time 20 minutes.Wilber Bihari, NP  06/03/20 8:54 PM Medical Oncology and Hematology [facility] [place], [place] [address] Tel. [phone]    Fax. [phone]    *Total Encounter Time as defined by the Centers for Medicare and Medicaid Services includes, in addition to the face-to-face time of a patient visit (documented in the note above) non-face-to-face time: obtaining and reviewing outside history, ordering and reviewing medications, tests or procedures, care coordination (communications with other health care professionals or caregivers) and documentation in the medical record.

## 2020-06-03 NOTE — Patient Instructions (Signed)

## 2020-06-04 ENCOUNTER — Ambulatory Visit
Admission: RE | Admit: 2020-06-04 | Discharge: 2020-06-04 | Disposition: A | Discharge status: Home / Self Care | Payer: 59 | Source: Ambulatory Visit | Attending: Radiation Oncology | Admitting: Radiation Oncology

## 2020-06-04 ENCOUNTER — Telehealth: Payer: Self-pay | Admitting: Adult Health

## 2020-06-04 DIAGNOSIS — Z510 Encounter for antineoplastic radiation therapy: Secondary | ICD-10-CM | POA: Diagnosis not present

## 2020-06-04 NOTE — Telephone Encounter (Signed)
No 12/28 los. No changes made to pt's schedule.  

## 2020-06-05 ENCOUNTER — Ambulatory Visit
Admission: RE | Admit: 2020-06-05 | Discharge: 2020-06-05 | Disposition: A | Discharge status: Home / Self Care | Payer: 59 | Source: Ambulatory Visit | Attending: Radiation Oncology | Admitting: Radiation Oncology

## 2020-06-05 DIAGNOSIS — Z510 Encounter for antineoplastic radiation therapy: Secondary | ICD-10-CM | POA: Diagnosis not present

## 2020-06-09 ENCOUNTER — Ambulatory Visit: Payer: 59 | Admitting: Radiation Oncology

## 2020-06-09 ENCOUNTER — Ambulatory Visit
Admission: RE | Admit: 2020-06-09 | Discharge: 2020-06-09 | Disposition: A | Discharge status: Home / Self Care | Payer: 59 | Source: Ambulatory Visit | Attending: Radiation Oncology | Admitting: Radiation Oncology

## 2020-06-09 ENCOUNTER — Other Ambulatory Visit: Payer: Self-pay

## 2020-06-09 DIAGNOSIS — Z170 Estrogen receptor positive status [ER+]: Secondary | ICD-10-CM | POA: Diagnosis not present

## 2020-06-09 DIAGNOSIS — Z510 Encounter for antineoplastic radiation therapy: Secondary | ICD-10-CM | POA: Insufficient documentation

## 2020-06-09 DIAGNOSIS — C50411 Malignant neoplasm of upper-outer quadrant of right female breast: Secondary | ICD-10-CM | POA: Diagnosis present

## 2020-06-10 ENCOUNTER — Ambulatory Visit: Payer: 59 | Admitting: Radiation Oncology

## 2020-06-10 ENCOUNTER — Ambulatory Visit
Admission: RE | Admit: 2020-06-10 | Discharge: 2020-06-10 | Disposition: A | Discharge status: Home / Self Care | Payer: 59 | Source: Ambulatory Visit | Attending: Radiation Oncology | Admitting: Radiation Oncology

## 2020-06-10 DIAGNOSIS — C50411 Malignant neoplasm of upper-outer quadrant of right female breast: Secondary | ICD-10-CM | POA: Diagnosis not present

## 2020-06-11 ENCOUNTER — Other Ambulatory Visit: Payer: Self-pay

## 2020-06-11 ENCOUNTER — Ambulatory Visit
Admission: RE | Admit: 2020-06-11 | Discharge: 2020-06-11 | Disposition: A | Discharge status: Home / Self Care | Payer: 59 | Source: Ambulatory Visit | Attending: Radiation Oncology | Admitting: Radiation Oncology

## 2020-06-11 DIAGNOSIS — C50411 Malignant neoplasm of upper-outer quadrant of right female breast: Secondary | ICD-10-CM | POA: Diagnosis not present

## 2020-06-12 ENCOUNTER — Other Ambulatory Visit: Payer: Self-pay

## 2020-06-12 ENCOUNTER — Ambulatory Visit
Admission: RE | Admit: 2020-06-12 | Discharge: 2020-06-12 | Disposition: A | Discharge status: Home / Self Care | Payer: 59 | Source: Ambulatory Visit | Attending: Radiation Oncology | Admitting: Radiation Oncology

## 2020-06-12 DIAGNOSIS — C50411 Malignant neoplasm of upper-outer quadrant of right female breast: Secondary | ICD-10-CM | POA: Diagnosis not present

## 2020-06-13 ENCOUNTER — Ambulatory Visit
Admission: RE | Admit: 2020-06-13 | Discharge: 2020-06-13 | Disposition: A | Discharge status: Home / Self Care | Payer: 59 | Source: Ambulatory Visit | Attending: Radiation Oncology | Admitting: Radiation Oncology

## 2020-06-13 DIAGNOSIS — C50411 Malignant neoplasm of upper-outer quadrant of right female breast: Secondary | ICD-10-CM | POA: Diagnosis not present

## 2020-06-16 ENCOUNTER — Ambulatory Visit
Admission: RE | Admit: 2020-06-16 | Discharge: 2020-06-16 | Disposition: A | Discharge status: Home / Self Care | Payer: 59 | Source: Ambulatory Visit | Attending: Radiation Oncology | Admitting: Radiation Oncology

## 2020-06-16 ENCOUNTER — Ambulatory Visit: Payer: 59 | Admitting: Radiation Oncology

## 2020-06-16 ENCOUNTER — Other Ambulatory Visit: Payer: Self-pay

## 2020-06-16 DIAGNOSIS — C50411 Malignant neoplasm of upper-outer quadrant of right female breast: Secondary | ICD-10-CM | POA: Diagnosis not present

## 2020-06-17 ENCOUNTER — Ambulatory Visit
Admission: RE | Admit: 2020-06-17 | Discharge: 2020-06-17 | Disposition: A | Discharge status: Home / Self Care | Payer: 59 | Source: Ambulatory Visit | Attending: Radiation Oncology | Admitting: Radiation Oncology

## 2020-06-17 DIAGNOSIS — C50411 Malignant neoplasm of upper-outer quadrant of right female breast: Secondary | ICD-10-CM | POA: Diagnosis not present

## 2020-06-18 ENCOUNTER — Other Ambulatory Visit: Payer: Self-pay

## 2020-06-18 ENCOUNTER — Ambulatory Visit
Admission: RE | Admit: 2020-06-18 | Discharge: 2020-06-18 | Disposition: A | Discharge status: Home / Self Care | Payer: 59 | Source: Ambulatory Visit | Attending: Radiation Oncology | Admitting: Radiation Oncology

## 2020-06-18 DIAGNOSIS — C50411 Malignant neoplasm of upper-outer quadrant of right female breast: Secondary | ICD-10-CM | POA: Diagnosis not present

## 2020-06-19 ENCOUNTER — Ambulatory Visit
Admission: RE | Admit: 2020-06-19 | Discharge: 2020-06-19 | Disposition: A | Discharge status: Home / Self Care | Payer: 59 | Source: Ambulatory Visit | Attending: Radiation Oncology | Admitting: Radiation Oncology

## 2020-06-19 DIAGNOSIS — C50411 Malignant neoplasm of upper-outer quadrant of right female breast: Secondary | ICD-10-CM | POA: Diagnosis not present

## 2020-06-20 ENCOUNTER — Telehealth: Payer: Self-pay

## 2020-06-20 ENCOUNTER — Ambulatory Visit
Admission: RE | Admit: 2020-06-20 | Discharge: 2020-06-20 | Disposition: A | Discharge status: Home / Self Care | Payer: 59 | Source: Ambulatory Visit | Attending: Radiation Oncology | Admitting: Radiation Oncology

## 2020-06-20 ENCOUNTER — Ambulatory Visit: Payer: 59

## 2020-06-20 ENCOUNTER — Other Ambulatory Visit: Payer: Self-pay

## 2020-06-20 DIAGNOSIS — C50411 Malignant neoplasm of upper-outer quadrant of right female breast: Secondary | ICD-10-CM | POA: Diagnosis not present

## 2020-06-20 NOTE — Telephone Encounter (Signed)
Called and left Mrs. Ure a message to come in at 1100 instead of 930 for her infusion due to inclement weather delays.  Left her our main number to call back and reschedule if that time did not work for her. Gardiner Rhyme, RN

## 2020-06-23 ENCOUNTER — Inpatient Hospital Stay: Payer: 59

## 2020-06-23 ENCOUNTER — Ambulatory Visit: Payer: 59

## 2020-06-23 ENCOUNTER — Inpatient Hospital Stay: Payer: 59 | Attending: Oncology

## 2020-06-23 ENCOUNTER — Other Ambulatory Visit: Payer: Self-pay | Admitting: *Deleted

## 2020-06-23 ENCOUNTER — Ambulatory Visit
Admission: RE | Admit: 2020-06-23 | Discharge: 2020-06-23 | Disposition: A | Discharge status: Home / Self Care | Payer: 59 | Source: Ambulatory Visit | Attending: Radiation Oncology | Admitting: Radiation Oncology

## 2020-06-23 ENCOUNTER — Other Ambulatory Visit: Payer: Self-pay

## 2020-06-23 VITALS — BP 134/77 | HR 81 | Temp 98.6°F | Resp 18 | Ht 62.0 in | Wt 220.2 lb

## 2020-06-23 DIAGNOSIS — Z170 Estrogen receptor positive status [ER+]: Secondary | ICD-10-CM

## 2020-06-23 DIAGNOSIS — C50411 Malignant neoplasm of upper-outer quadrant of right female breast: Secondary | ICD-10-CM | POA: Diagnosis not present

## 2020-06-23 DIAGNOSIS — Z5112 Encounter for antineoplastic immunotherapy: Secondary | ICD-10-CM | POA: Insufficient documentation

## 2020-06-23 DIAGNOSIS — C50412 Malignant neoplasm of upper-outer quadrant of left female breast: Secondary | ICD-10-CM | POA: Diagnosis present

## 2020-06-23 DIAGNOSIS — E559 Vitamin D deficiency, unspecified: Secondary | ICD-10-CM

## 2020-06-23 DIAGNOSIS — Z95828 Presence of other vascular implants and grafts: Secondary | ICD-10-CM

## 2020-06-23 DIAGNOSIS — E1165 Type 2 diabetes mellitus with hyperglycemia: Secondary | ICD-10-CM

## 2020-06-23 LAB — CBC WITH DIFFERENTIAL/PLATELET
Abs Immature Granulocytes: 0.01 10*3/uL (ref 0.00–0.07)
Basophils Absolute: 0 10*3/uL (ref 0.0–0.1)
Basophils Relative: 0 %
Eosinophils Absolute: 0.2 10*3/uL (ref 0.0–0.5)
Eosinophils Relative: 4 %
HCT: 33.2 % — ABNORMAL LOW (ref 36.0–46.0)
Hemoglobin: 11.3 g/dL — ABNORMAL LOW (ref 12.0–15.0)
Immature Granulocytes: 0 %
Lymphocytes Relative: 16 %
Lymphs Abs: 0.9 10*3/uL (ref 0.7–4.0)
MCH: 31.8 pg (ref 26.0–34.0)
MCHC: 34 g/dL (ref 30.0–36.0)
MCV: 93.5 fL (ref 80.0–100.0)
Monocytes Absolute: 0.6 10*3/uL (ref 0.1–1.0)
Monocytes Relative: 10 %
Neutro Abs: 4.1 10*3/uL (ref 1.7–7.7)
Neutrophils Relative %: 70 %
Platelets: 202 10*3/uL (ref 150–400)
RBC: 3.55 MIL/uL — ABNORMAL LOW (ref 3.87–5.11)
RDW: 14.1 % (ref 11.5–15.5)
WBC: 5.9 10*3/uL (ref 4.0–10.5)
nRBC: 0 % (ref 0.0–0.2)

## 2020-06-23 LAB — COMPREHENSIVE METABOLIC PANEL
ALT: 12 U/L (ref 0–44)
AST: 10 U/L — ABNORMAL LOW (ref 15–41)
Albumin: 3.4 g/dL — ABNORMAL LOW (ref 3.5–5.0)
Alkaline Phosphatase: 55 U/L (ref 38–126)
Anion gap: 8 (ref 5–15)
BUN: 14 mg/dL (ref 8–23)
CO2: 25 mmol/L (ref 22–32)
Calcium: 8.5 mg/dL — ABNORMAL LOW (ref 8.9–10.3)
Chloride: 107 mmol/L (ref 98–111)
Creatinine, Ser: 0.95 mg/dL (ref 0.44–1.00)
GFR, Estimated: >60 mL/min (ref >60)
Glucose, Bld: 179 mg/dL — ABNORMAL HIGH (ref 70–99)
Potassium: 4 mmol/L (ref 3.5–5.1)
Sodium: 140 mmol/L (ref 135–145)
Total Bilirubin: 0.5 mg/dL (ref 0.3–1.2)
Total Protein: 6.7 g/dL (ref 6.5–8.1)

## 2020-06-23 MED ORDER — SODIUM CHLORIDE 0.9% FLUSH
10.0000 mL | INTRAVENOUS | Status: DC | PRN
  Administered 2020-06-23: 10 mL
  Filled 2020-06-23: qty 10

## 2020-06-23 MED ORDER — PALONOSETRON HCL INJECTION 0.25 MG/5ML
INTRAVENOUS | Status: AC
  Filled 2020-06-23: qty 5

## 2020-06-23 MED ORDER — ACETAMINOPHEN 325 MG PO TABS
ORAL_TABLET | ORAL | Status: AC
  Filled 2020-06-23: qty 2

## 2020-06-23 MED ORDER — DIPHENHYDRAMINE HCL 12.5 MG/5ML PO ELIX: 25.0000 mg | ORAL_SOLUTION | Freq: Once | ORAL | Status: DC

## 2020-06-23 MED ORDER — SODIUM CHLORIDE 0.9 % IV SOLN
Freq: Once | INTRAVENOUS | Status: AC
  Filled 2020-06-23: qty 250

## 2020-06-23 MED ORDER — TRASTUZUMAB-ANNS CHEMO 150 MG IV SOLR
600.0000 mg | Freq: Once | INTRAVENOUS | Status: AC
  Administered 2020-06-23: 600 mg via INTRAVENOUS
  Filled 2020-06-23: qty 28.57

## 2020-06-23 MED ORDER — HEPARIN SOD (PORK) LOCK FLUSH 100 UNIT/ML IV SOLN
500.0000 [IU] | Freq: Once | INTRAVENOUS | Status: AC | PRN
  Administered 2020-06-23: 500 [IU]
  Filled 2020-06-23: qty 5

## 2020-06-23 MED ORDER — DIPHENHYDRAMINE HCL 12.5 MG/5ML PO ELIX
ORAL_SOLUTION | ORAL | Status: AC
  Filled 2020-06-23: qty 5

## 2020-06-23 MED ORDER — CYANOCOBALAMIN 1000 MCG/ML IJ SOLN
INTRAMUSCULAR | Status: AC
  Filled 2020-06-23: qty 1

## 2020-06-23 MED ORDER — ACETAMINOPHEN 325 MG PO TABS
650.0000 mg | ORAL_TABLET | Freq: Once | ORAL | Status: AC
  Administered 2020-06-23: 650 mg via ORAL

## 2020-06-23 NOTE — Patient Instructions (Signed)
[facility] Discharge Instructions for Patients Receiving Chemotherapy  Today you received the following chemotherapy agents: Trastuzumab (Herceptin).  To help prevent nausea and vomiting after your treatment, we encourage you to take your nausea medication as prescribed. If you develop nausea and vomiting that is not controlled by your nausea medication, call the clinic.   BELOW ARE SYMPTOMS THAT SHOULD BE REPORTED IMMEDIATELY:  *FEVER GREATER THAN 100.5 F  *CHILLS WITH OR WITHOUT FEVER  NAUSEA AND VOMITING THAT IS NOT CONTROLLED WITH YOUR NAUSEA MEDICATION  *UNUSUAL SHORTNESS OF BREATH  *UNUSUAL BRUISING OR BLEEDING  TENDERNESS IN MOUTH AND THROAT WITH OR WITHOUT PRESENCE OF ULCERS  *URINARY PROBLEMS  *BOWEL PROBLEMS  UNUSUAL RASH Items with * indicate a potential emergency and should be followed up as soon as possible.  Feel free to call the clinic should you have any questions or concerns. The clinic phone number is [phone].  Please show the CHEMO ALERT CARD at check-in to the Emergency Department and triage nurse.   

## 2020-06-23 NOTE — Patient Instructions (Signed)

## 2020-06-24 ENCOUNTER — Ambulatory Visit
Admission: RE | Admit: 2020-06-24 | Discharge: 2020-06-24 | Disposition: A | Discharge status: Home / Self Care | Payer: 59 | Source: Ambulatory Visit | Attending: Radiation Oncology | Admitting: Radiation Oncology

## 2020-06-24 DIAGNOSIS — C50411 Malignant neoplasm of upper-outer quadrant of right female breast: Secondary | ICD-10-CM | POA: Diagnosis not present

## 2020-06-25 ENCOUNTER — Ambulatory Visit
Admission: RE | Admit: 2020-06-25 | Discharge: 2020-06-25 | Disposition: A | Discharge status: Home / Self Care | Payer: 59 | Source: Ambulatory Visit | Attending: Radiation Oncology | Admitting: Radiation Oncology

## 2020-06-25 ENCOUNTER — Encounter: Payer: Self-pay | Admitting: *Deleted

## 2020-06-25 ENCOUNTER — Other Ambulatory Visit: Payer: Self-pay

## 2020-06-25 ENCOUNTER — Encounter: Payer: Self-pay | Admitting: Adult Health

## 2020-06-25 DIAGNOSIS — C50411 Malignant neoplasm of upper-outer quadrant of right female breast: Secondary | ICD-10-CM

## 2020-06-25 DIAGNOSIS — Z170 Estrogen receptor positive status [ER+]: Secondary | ICD-10-CM

## 2020-06-25 LAB — HM DEXA SCAN

## 2020-06-25 MED ORDER — RADIAPLEXRX EX GEL: Freq: Once | CUTANEOUS | Status: AC

## 2020-06-26 ENCOUNTER — Ambulatory Visit: Payer: 59

## 2020-06-26 ENCOUNTER — Ambulatory Visit
Admission: RE | Admit: 2020-06-26 | Discharge: 2020-06-26 | Disposition: A | Discharge status: Home / Self Care | Payer: 59 | Source: Ambulatory Visit | Attending: Radiation Oncology | Admitting: Radiation Oncology

## 2020-06-26 ENCOUNTER — Telehealth: Payer: Self-pay

## 2020-06-26 ENCOUNTER — Other Ambulatory Visit: Payer: Self-pay

## 2020-06-26 DIAGNOSIS — C50411 Malignant neoplasm of upper-outer quadrant of right female breast: Secondary | ICD-10-CM | POA: Diagnosis not present

## 2020-06-26 NOTE — Telephone Encounter (Signed)
RN reviewed bone density results with Wilber Bihari, NP -   Results showing osteopenia.   Pt currently on Vitamin D.   Per Wilber Bihari, NP add Calcium and encourage weight bearing exercises.    RN notified patient of above recommendations.  Pt verbalized understanding and agreement.

## 2020-06-27 ENCOUNTER — Ambulatory Visit: Payer: 59

## 2020-06-30 ENCOUNTER — Encounter: Payer: Self-pay | Admitting: Radiation Oncology

## 2020-06-30 ENCOUNTER — Ambulatory Visit
Admission: RE | Admit: 2020-06-30 | Discharge: 2020-06-30 | Disposition: A | Discharge status: Home / Self Care | Payer: 59 | Source: Ambulatory Visit | Attending: Radiation Oncology | Admitting: Radiation Oncology

## 2020-06-30 DIAGNOSIS — C50411 Malignant neoplasm of upper-outer quadrant of right female breast: Secondary | ICD-10-CM | POA: Diagnosis not present

## 2020-07-01 NOTE — Progress Notes (Signed)
  Patient Name: Tracy Garrett MRN: [id number] DOB: [birth year] Referring Physician: Lurline Del (Profile Not Attached) Date of Service: 06/30/2020 [facility], [place]                                                        End Of Treatment Note  Diagnoses: C50.411-Malignant neoplasm of upper-outer quadrant of right female breast  Cancer Staging: Cancer Staging Malignant neoplasm of upper-outer quadrant of right breast in female, estrogen receptor positive ([place]) Staging form: Breast, AJCC 8th Edition - Clinical stage from 11/28/2019: Stage IA (cT1b, cN0, cM0, G3, ER+, PR+, HER2+) - Unsigned - Pathologic stage from 05/06/2020: Stage IA (pT2, pN0(i+), cM0, G2, ER+, PR+, HER2+) - Signed by Eppie Gibson, MD on 05/07/2020   Intent: Curative  Radiation Treatment Dates: 05/14/2020 through 06/30/2020 Site Technique Total Dose (Gy) Dose per Fx (Gy) Completed Fx Beam Energies  Chest Wall, Right: CW_Rt 3D 50/50 2 25/25 10X, 15X  Chest Wall, Right: CW_Rt_PAB_SCV 3D 45/45 1.8 25/25 10X, 15X  Chest Wall, Right: CW_Rt_Bst Electron 10/10 2 5/5 6E   Narrative: The patient tolerated radiation therapy relatively well.   Plan: The patient will follow-up with radiation oncology in 47mo, or as needed.  -----------------------------------  Eppie Gibson, MD

## 2020-07-08 ENCOUNTER — Other Ambulatory Visit: Payer: Self-pay | Admitting: Adult Health

## 2020-07-08 DIAGNOSIS — I10 Essential (primary) hypertension: Secondary | ICD-10-CM

## 2020-07-14 ENCOUNTER — Inpatient Hospital Stay: Payer: 59

## 2020-07-14 ENCOUNTER — Inpatient Hospital Stay: Payer: 59 | Attending: Oncology

## 2020-07-14 ENCOUNTER — Other Ambulatory Visit: Payer: Self-pay

## 2020-07-14 ENCOUNTER — Inpatient Hospital Stay: Payer: 59 | Admitting: Oncology

## 2020-07-14 VITALS — BP 133/64 | HR 79 | Temp 99.3°F | Resp 17 | Ht 62.0 in | Wt 214.8 lb

## 2020-07-14 DIAGNOSIS — C50411 Malignant neoplasm of upper-outer quadrant of right female breast: Secondary | ICD-10-CM

## 2020-07-14 DIAGNOSIS — Z7982 Long term (current) use of aspirin: Secondary | ICD-10-CM | POA: Insufficient documentation

## 2020-07-14 DIAGNOSIS — Z803 Family history of malignant neoplasm of breast: Secondary | ICD-10-CM | POA: Insufficient documentation

## 2020-07-14 DIAGNOSIS — Z801 Family history of malignant neoplasm of trachea, bronchus and lung: Secondary | ICD-10-CM | POA: Diagnosis not present

## 2020-07-14 DIAGNOSIS — Z170 Estrogen receptor positive status [ER+]: Secondary | ICD-10-CM

## 2020-07-14 DIAGNOSIS — N060 Isolated proteinuria with minor glomerular abnormality: Secondary | ICD-10-CM | POA: Diagnosis not present

## 2020-07-14 DIAGNOSIS — Z95828 Presence of other vascular implants and grafts: Secondary | ICD-10-CM

## 2020-07-14 DIAGNOSIS — Z79899 Other long term (current) drug therapy: Secondary | ICD-10-CM | POA: Diagnosis not present

## 2020-07-14 DIAGNOSIS — M129 Arthropathy, unspecified: Secondary | ICD-10-CM | POA: Insufficient documentation

## 2020-07-14 DIAGNOSIS — E119 Type 2 diabetes mellitus without complications: Secondary | ICD-10-CM | POA: Insufficient documentation

## 2020-07-14 DIAGNOSIS — N189 Chronic kidney disease, unspecified: Secondary | ICD-10-CM | POA: Diagnosis not present

## 2020-07-14 DIAGNOSIS — Z7984 Long term (current) use of oral hypoglycemic drugs: Secondary | ICD-10-CM | POA: Insufficient documentation

## 2020-07-14 DIAGNOSIS — E1165 Type 2 diabetes mellitus with hyperglycemia: Secondary | ICD-10-CM

## 2020-07-14 DIAGNOSIS — Z5111 Encounter for antineoplastic chemotherapy: Secondary | ICD-10-CM | POA: Diagnosis not present

## 2020-07-14 DIAGNOSIS — Z806 Family history of leukemia: Secondary | ICD-10-CM | POA: Diagnosis not present

## 2020-07-14 DIAGNOSIS — K219 Gastro-esophageal reflux disease without esophagitis: Secondary | ICD-10-CM | POA: Diagnosis not present

## 2020-07-14 DIAGNOSIS — I129 Hypertensive chronic kidney disease with stage 1 through stage 4 chronic kidney disease, or unspecified chronic kidney disease: Secondary | ICD-10-CM | POA: Diagnosis not present

## 2020-07-14 DIAGNOSIS — Z9013 Acquired absence of bilateral breasts and nipples: Secondary | ICD-10-CM | POA: Diagnosis not present

## 2020-07-14 DIAGNOSIS — E785 Hyperlipidemia, unspecified: Secondary | ICD-10-CM | POA: Insufficient documentation

## 2020-07-14 DIAGNOSIS — E559 Vitamin D deficiency, unspecified: Secondary | ICD-10-CM

## 2020-07-14 LAB — CBC WITH DIFFERENTIAL/PLATELET
Abs Immature Granulocytes: 0.02 10*3/uL (ref 0.00–0.07)
Basophils Absolute: 0 10*3/uL (ref 0.0–0.1)
Basophils Relative: 0 %
Eosinophils Absolute: 0.2 10*3/uL (ref 0.0–0.5)
Eosinophils Relative: 3 %
HCT: 35.2 % — ABNORMAL LOW (ref 36.0–46.0)
Hemoglobin: 11.6 g/dL — ABNORMAL LOW (ref 12.0–15.0)
Immature Granulocytes: 0 %
Lymphocytes Relative: 17 %
Lymphs Abs: 1.2 10*3/uL (ref 0.7–4.0)
MCH: 30.4 pg (ref 26.0–34.0)
MCHC: 33 g/dL (ref 30.0–36.0)
MCV: 92.4 fL (ref 80.0–100.0)
Monocytes Absolute: 0.7 10*3/uL (ref 0.1–1.0)
Monocytes Relative: 10 %
Neutro Abs: 5.2 10*3/uL (ref 1.7–7.7)
Neutrophils Relative %: 70 %
Platelets: 223 10*3/uL (ref 150–400)
RBC: 3.81 MIL/uL — ABNORMAL LOW (ref 3.87–5.11)
RDW: 13.3 % (ref 11.5–15.5)
WBC: 7.3 10*3/uL (ref 4.0–10.5)
nRBC: 0 % (ref 0.0–0.2)

## 2020-07-14 LAB — COMPREHENSIVE METABOLIC PANEL
ALT: 11 U/L (ref 0–44)
AST: 10 U/L — ABNORMAL LOW (ref 15–41)
Albumin: 3.5 g/dL (ref 3.5–5.0)
Alkaline Phosphatase: 64 U/L (ref 38–126)
Anion gap: 8 (ref 5–15)
BUN: 18 mg/dL (ref 8–23)
CO2: 24 mmol/L (ref 22–32)
Calcium: 9 mg/dL (ref 8.9–10.3)
Chloride: 104 mmol/L (ref 98–111)
Creatinine, Ser: 1.05 mg/dL — ABNORMAL HIGH (ref 0.44–1.00)
GFR, Estimated: 59 mL/min — ABNORMAL LOW (ref >60)
Glucose, Bld: 253 mg/dL — ABNORMAL HIGH (ref 70–99)
Potassium: 4.2 mmol/L (ref 3.5–5.1)
Sodium: 136 mmol/L (ref 135–145)
Total Bilirubin: 0.4 mg/dL (ref 0.3–1.2)
Total Protein: 6.8 g/dL (ref 6.5–8.1)

## 2020-07-14 MED ORDER — DIPHENHYDRAMINE HCL 12.5 MG/5ML PO ELIX
ORAL_SOLUTION | ORAL | Status: AC
  Filled 2020-07-14: qty 5

## 2020-07-14 MED ORDER — SODIUM CHLORIDE 0.9% FLUSH
10.0000 mL | INTRAVENOUS | Status: DC | PRN
  Administered 2020-07-14: 10 mL
  Filled 2020-07-14: qty 10

## 2020-07-14 MED ORDER — ACETAMINOPHEN 325 MG PO TABS
ORAL_TABLET | ORAL | Status: AC
  Filled 2020-07-14: qty 2

## 2020-07-14 MED ORDER — HEPARIN SOD (PORK) LOCK FLUSH 100 UNIT/ML IV SOLN
500.0000 [IU] | Freq: Once | INTRAVENOUS | Status: AC | PRN
  Administered 2020-07-14: 500 [IU]
  Filled 2020-07-14: qty 5

## 2020-07-14 MED ORDER — TRASTUZUMAB-ANNS CHEMO 150 MG IV SOLR
600.0000 mg | Freq: Once | INTRAVENOUS | Status: AC
  Administered 2020-07-14: 600 mg via INTRAVENOUS
  Filled 2020-07-14: qty 28.57

## 2020-07-14 MED ORDER — DIPHENHYDRAMINE HCL 12.5 MG/5ML PO ELIX: 25.0000 mg | ORAL_SOLUTION | Freq: Once | ORAL | Status: DC

## 2020-07-14 MED ORDER — ACETAMINOPHEN 325 MG PO TABS
650.0000 mg | ORAL_TABLET | Freq: Once | ORAL | Status: AC
  Administered 2020-07-14: 650 mg via ORAL

## 2020-07-14 MED ORDER — SODIUM CHLORIDE 0.9 % IV SOLN
Freq: Once | INTRAVENOUS | Status: AC
  Filled 2020-07-14: qty 250

## 2020-07-14 NOTE — Progress Notes (Signed)
Pt last echo was 04/03/2020 ok to proceed with treatment today.

## 2020-07-14 NOTE — Progress Notes (Signed)
[facility]  [phone] [phone]     ID: Tracy Garrett DOB: 07-27-1955  MR#: [id number]  KLK#:[id number]  Patient Care Team: Unk Pinto, MD as PCP - General (Internal Medicine) [name], RN as Oncology Nurse Navigator [name], RN as Oncology Nurse Navigator Erroll Luna, MD as Consulting Physician (General Surgery) Vertis Bauder, Virgie Dad, MD as Consulting Physician (Oncology) Eppie Gibson, MD as Attending Physician (Radiation Oncology) Mcarthur Rossetti, MD as Consulting Physician (Orthopedic Surgery) Wallene Huh, DPM as Consulting Physician (Podiatry) Larey Dresser, MD as Consulting Physician (Cardiology) Chauncey Cruel, MD OTHER MD:  CHIEF COMPLAINT: triple positive breast cancer (s/p bilateral mastectomies)  CURRENT TREATMENT: Adjuvant immunotherapy   INTERVAL HISTORY: Tracy Garrett returns today for follow up and treatment of her triple positive breast cancer accompanied by her husband.   Since her last visit, she completed her adjuvant radiation therapy on 06/30/2020 under Dr. Isidore Moos.  She generally did well with the treatments, with some peeling and some discoloration but that all is resolving.  She continues to work from home.  She is here today for Trastuzumab every 3 weeks.    Her most recent echocardiogram on 04/03/2020 showed an ejection fraction of 55-60%. This is stable from prior in 12/2019.   REVIEW OF SYSTEMS: Ally is not exercising.  She used to do some biking, but then had an ankle issue and really has not been doing very much lately.  She has occasional gastritis symptoms in the morning.  Aside from that a detailed review of systems today was stable   COVID 19 VACCINATION STATUS: fully vaccinated [employer]), with booster 05/2020   HISTORY OF CURRENT ILLNESS: From the original intake note:  [name] had routine screening mammography on 05/01/2020 showing a possible  abnormality in the right breast. She underwent right diagnostic mammography with tomography at [facility] on 05/09/2019 showing: breast density category A; probably-benign regional calcifications in the right breast at 11 o'clock. Short term follow up was recommended.   She returned for follow up at [facility] on 11/13/2019. She underwent right diagnostic mammography and right breast ultrasonography showing: breast composition A; indeterminate calcifications in upper-outer right breast spanning 5 cm; 0.8 cm irregular mass in right breast at 9 o'clock.  Accordingly on 11/20/2019 she proceeded to biopsy of the right breast areas in question. The pathology from this procedure ([id number]) showed:  1. Right breast, UOQ  - foci of invasive and in situ mammary carcinoma, grade 2-3, e-cadherin positive.  2. Right breast, 9 o'clock  - invasive mammary carcinoma, grade 2-3, e-cadherin positive  - Prognostic indicators significant for: estrogen receptor, 95% positive and progesterone receptor, 25% positive, both with strong staining intensity. Proliferation marker Ki67 at 20%. HER2 equivocal by immunohistochemistry (2+), but positive by fluorescent in situ hybridization with a signals ratio 3.30 and number per cell 7.25.  The patient's subsequent history is as detailed below.   PAST MEDICAL HISTORY: Past Medical History:  Diagnosis Date  . Allergy   . Anemia   . Arthritis   . Cancer [facility])    right breast cancer  . Cataracts, bilateral   . Chronic kidney disease    CKD  . Family history of breast cancer   . Family history of multiple myeloma   . Fatty liver disease, nonalcoholic 5/37/4827   [place] appears normal on follow up US 09/2019  . GERD (gastroesophageal reflux disease)   . History of kidney stones    x2 episodes  .  Hyperlipidemia   . Hypertension   . PONV (postoperative nausea and vomiting)   . Sleep apnea    test was dx, as mild- no further tx. required  . Type II or unspecified type diabetes  mellitus without mention of complication, not stated as uncontrolled     PAST SURGICAL HISTORY: Past Surgical History:  Procedure Laterality Date  . BREAST SURGERY  1999   Reduction Mammoplasties  . CHOLECYSTECTOMY  2003  . colonoscopsy  11/2007   neg- polyp removal with both procedures x2  . CYSTOSCOPY     surgery to extract stone x 1 with stent  . EUS N/A 07/26/2014   Procedure: UPPER ENDOSCOPIC ULTRASOUND (EUS) LINEAR;  Surgeon: Beryle Beams, MD;  Location: [facility];  Service: Endoscopy;  Laterality: N/A;  . 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 LYSIS OF ADHESION  10/10/2014   Procedure: LYSIS OF ADHESION;  Surgeon: TJackolyn Confer MD;  Location: [facility] OR[facility];  Service: General;;  . MASTECTOMY W/ SENTINEL NODE BIOPSY Bilateral 01/02/2020   Procedure: BILATERAL MASTECTOMY WITH RIGHT SENTINEL LYMPH NODE MAPPING;  Surgeon: CErroll Luna MD;  [place] [facility]  Service: General;  Laterality: Bilateral;  PEC BLOCK  . PORTACATH PLACEMENT Right 01/02/2020   Procedure: INSERTION PORT-A-CATH WITH ULTRASOUND GUIDANCE;  Surgeon: CErroll Luna MD;  [place] [facility] [place]  Service: General;  Laterality: Right;  . TONSILLECTOMY     and adenoids removed age [age] . VAGINAL HYSTERECTOMY  2002    FAMILY HISTORY: Family History  Problem Relation Age of Onset  . Breast cancer Mother 777 . Heart disease Father        smoker  . Diabetes type I Half-Sister   . Alzheimer's disease Maternal Grandmother   . Emphysema Paternal Grandfather        smoker  . Heart attack Paternal Grandfather   . Lung cancer Paternal Grandfather   . CAD Brother 573      stents  . Breast cancer Maternal Aunt        dx. in her late 727s . Breast cancer Cousin 511 . Cancer Maternal Uncle        dx. in his 668s tumor on the back of neck  . Breast cancer Paternal Aunt         dx. >50  . Multiple myeloma Maternal Aunt 92  . Multiple myeloma Paternal Aunt        dx. >50  . Breast cancer Cousin 556  Her father died at age 193661 Her mother died at age 19129 She has 2 brothers and had 1 sister who is deceased. She reports breast cancer on her maternal side-- her mother at age 19133 an aunt, and a cousin.   GYNECOLOGIC HISTORY:  No LMP recorded. Patient has had a hysterectomy. Menarche: 65years old Age [place] first live birth: 65[place] L[place] unsure Contraceptive: never used HRT never used  Hysterectomy? Yes, 2002 BSO? 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    HEALTH MAINTENANCE: Social History   Tobacco Use  . Smoking status: Never Smoker  . Smokeless tobacco: Never Used  Vaping Use  . Vaping Use: Never used  Substance Use Topics  . Alcohol use: No  . Drug use: No     Colonoscopy: 02/2019 (Dr. Collene Mares), repeat due 2025  PAP: none on file (s/p hysterectomy)  Bone density: never done   Allergies  Allergen Reactions  . Pecan Nut (Diagnostic)     Other reaction(s): Unknown  . Hydromorphone Rash    Arm reddened at IV med site  . Other Itching and Rash    NUTS CAUSE MOUTH TO [facility]    Current Outpatient Medications  Medication Sig Dispense Refill  . aspirin EC 81 MG tablet Take 81 mg by mouth daily. Swallow whole.    . cetirizine (ZYRTEC) 10 MG tablet Take 10 mg by mouth daily.    . Cholecalciferol (VITAMIN D3) 5000 UNITS TABS Take 5,000 Units by mouth daily.     Marland Kitchen glucose blood (CONTOUR NEXT TEST) test strip Check blood sugar 1 time daily-DX-E11.9 100 each 12  . losartan (COZAAR) 50 MG tablet TAKE 1/2-1 TAB  DAILY FOR BLOOD PRESSURE AND TO PROTECT KIDNEYS. 30 tablet 5  . metFORMIN (GLUCOPHAGE XR) 500 MG 24 hr tablet Take 1 tablet (500 mg total) by mouth daily with breakfast. 30 tablet 11  . Omega-3 Fatty Acids (OMEGA 3 PO) Take 1 capsule by mouth 2 (two) times daily. (Patient not taking: No sig reported)    . pantoprazole (PROTONIX) 40 MG tablet Take 1 tablet (40 mg total) by mouth daily. 90 tablet 0  . promethazine-dextromethorphan (PROMETHAZINE-DM) 6.25-15 MG/5ML syrup Take 5 mLs by mouth 4 (four) times daily as needed for cough. 240 mL 1  . rosuvastatin (CRESTOR) 5 MG tablet Take     1 tablet     Daily      for Cholesterol                     TAKE 1 TABLET BY MOUTH EVERY DAY FOR CHOLESTEROL 90 tablet 0   No current facility-administered medications for this visit.    OBJECTIVE: African-American woman who appears well  Vitals:   07/14/20 1045  BP: 133/64  Pulse: 79  Resp: 17  Temp: 99.3 F (37.4 C)  SpO2: 100%     Body mass index is 39.29 kg/m.   Wt Readings from Last 3 Encounters:  07/14/20 214 lb 12.8 oz (97.4 kg)  06/23/20 220 lb 4 oz (99.9 kg)  06/03/20 217 lb 1.6 oz (98.5 kg)     ECOG FS:1 - Symptomatic but completely ambulatory  Sclerae unicteric, EOMs intact Wearing a mask No cervical or supraclavicular adenopathy Lungs no rales or rhonchi Heart regular rate and rhythm Abd soft, nontender, positive bowel sounds MSK no focal spinal tenderness, no upper extremity lymphedema Neuro: nonfocal, well oriented, appropriate affect Breasts: Status post bilateral mastectomies.   LAB RESULTS:  CMP     Component Value Date/Time   NA 140 06/23/2020 1138   K 4.0 06/23/2020 1138   CL 107 06/23/2020 1138   CO2 25 06/23/2020 1138   GLUCOSE 179 (H) 06/23/2020 1138   BUN 14 06/23/2020 1138   CREATININE 0.95 06/23/2020 1138   CREATININE 0.94 11/28/2019 1217   CREATININE 0.82 11/26/2019 1533   CALCIUM 8.5 (L) 06/23/2020 1138   PROT 6.7 06/23/2020 1138   ALBUMIN 3.4 (L)  06/23/2020 1138  AST 10 (L) 06/23/2020 1138   AST 10 (L) 11/28/2019 1217   ALT 12 06/23/2020 1138   ALT 16 11/28/2019 1217   ALKPHOS 55 06/23/2020 1138   BILITOT 0.5 06/23/2020 1138   BILITOT 0.6 11/28/2019 1217   GFRNONAA >60 06/23/2020 1138   GFRNONAA >60 11/28/2019 1217   GFRNONAA 76 11/26/2019 1533   GFRAA >60 03/03/2020 1343   GFRAA >60 11/28/2019 1217   GFRAA 88 11/26/2019 1533    Lab Results  Component Value Date   ALBUMINELP 3.9 08/23/2019   A1GS 0.4 (H) 08/23/2019   A2GS 0.9 08/23/2019   BETS 0.5 08/23/2019   BETA2SER 0.4 08/23/2019   GAMS 0.9 08/23/2019   SPEI  08/23/2019     Comment:     . Alpha-1 globulin increase noted. .     Lab Results  Component Value Date   WBC 7.3 07/14/2020   NEUTROABS 5.2 07/14/2020   HGB 11.6 (L) 07/14/2020   HCT 35.2 (L) 07/14/2020   MCV 92.4 07/14/2020   PLT 223 07/14/2020    No results found for: LABCA2  No components found for: [id number]  No results for input(s): INR in the last 168 hours.  No results found for: LABCA2  No results found for: WLS937  No results found for: DSK876  No results found for: OTL572  No results found for: CA2729  No components found for: HGQUANT  No results found for: CEA1 / No results found for: CEA1   No results found for: AFPTUMOR  No results found for: CHROMOGRNA  No results found for: KPAFRELGTCHN, LAMBDASER, KAPLAMBRATIO (kappa/lambda light chains)  No results found for: HGBA, HGBA2QUANT, HGBFQUANT, HGBSQUAN (Hemoglobinopathy evaluation)   No results found for: LDH  Lab Results  Component Value Date   IRON 54 11/26/2019   TIBC 378 11/26/2019   IRONPCTSAT 14 (L) 11/26/2019   (Iron and TIBC)  Lab Results  Component Value Date   FERRITIN 91 05/21/2015    Urinalysis    Component Value Date/Time   COLORURINE YELLOW 11/26/2019 1533   APPEARANCEUR CLOUDY (A) 11/26/2019 1533   LABSPEC 1.019 11/26/2019 1533   PHURINE < OR = 5.0 11/26/2019 1533   GLUCOSEU 1+  (A) 11/26/2019 1533   HGBUR NEGATIVE 11/26/2019 1533   BILIRUBINUR NEG 11/22/2013 1031   BILIRUBINUR neg 10/28/2013 1159   KETONESUR NEGATIVE 11/26/2019 1533   PROTEINUR 2+ (A) 11/26/2019 1533   UROBILINOGEN 0.2 11/22/2013 1031   NITRITE NEGATIVE 08/13/2019 1359   LEUKOCYTESUR 1+ (A) 08/13/2019 1359    STUDIES: No results found.   ELIGIBLE FOR AVAILABLE RESEARCH PROTOCOL: AET  ASSESSMENT: 65 y.o. [place] woman status post right breast upper outer quadrant biopsy 11/20/2019 for a clinical mT1b N0, stage IA invasive ductal carcinoma, grade 2 or 3, estrogen and progesterone receptor positive, HER-2 amplified, with an MIB-1 of 20%  (1) status post bilateral mastectomies 01/02/2020 showing  (a) on the right, an mpT2 pN0(i+), stage IB invasive ductal carcinoma, triple positive, with close but negative margins; 2 sentinel lymph nodes removed  (b) on the left, no malignancy identified  (c) the patient is not planning on reconstruction  (2) adjuvant chemo immunotherapy consisting of Abraxane weekly x12 with trastuzumab started 02/04/2020 completed 04/21/2020  (a) echo 12/13/2019 shows an ejection fraction in the 55-60% range  (b) echo 04/03/2020 shows an ejection fraction in the 55-60% range.  (3) trastuzumab to be continued to total 1 year  (4) adjuvant radiation to follow  (5) genetics testing 12/16/2019 through the  [facility] Breast Cancer STAT panel + Common Hereditary Cancers panel found no deleterious mutations in ATM, BRCA1, BRCA2, CDH1, CHEK2, PALB2, PTEN, STK11 and TP53. The Common Hereditary Cancers Panel offered by [facility] includes sequencing and/or deletion duplication testing of the following 48 genes: APC, ATM, AXIN2, BARD1, BMPR1A, BRCA1, BRCA2, BRIP1, CDH1, CDK4, CDKN2A (p14ARF), CDKN2A (p16INK4a), CHEK2, CTNNA1, DICER1, EPCAM (Deletion/duplication testing only), GREM1 (promoter region deletion/duplication testing only), KIT, MEN1, MLH1, MSH2, MSH3, MSH6, MUTYH, NBN, NF1,  NTHL1, PALB2, PDGFRA, PMS2, POLD1, POLE, PTEN, RAD50, RAD51C, RAD51D, RNF43, SDHB, SDHC, SDHD, SMAD4, SMARCA4. STK11, TP53, TSC1, TSC2, and VHL.  The following genes were evaluated for sequence changes only: SDHA and HOXB13 c.251G>A variant only.  (a) Two variants of uncertain significance (VUS) were detected - one in the BRIP1 gene called c.1735C>T and a second in the POLD1 gene called c.2861C>G.   (6) antiestrogens to follow-up   PLAN: Shareece is done with radiation and chemo.  She continues on trastuzumab and the plan is to repeat that every 21 days through August.  I have requested an echo sometime this month.  That will be repeated in May and then finally in August as well  I have encouraged her to exercise regularly.  The goal is 45 minutes 5 days a week  She knows to call for any other issue that may develop before the next visit  Total encounter time 25 minutes.Sarajane Jews C. Elvin Mccartin, MD 07/14/20 10:56 AM Medical Oncology and Hematology [facility] [place], [place] [address] Tel. [phone]    Fax. [phone]   I, Wilburn Mylar, am acting as scribe for Dr. Virgie Dad. Dequavius Kuhner.  I, Lurline Del MD, have reviewed the above documentation for accuracy and completeness, and I agree with the above.    *Total Encounter Time as defined by the Centers for Medicare and Medicaid Services includes, in addition to the face-to-face time of a patient visit (documented in the note above) non-face-to-face time: obtaining and reviewing outside history, ordering and reviewing medications, tests or procedures, care coordination (communications with other health care professionals or caregivers) and documentation in the medical record.

## 2020-07-14 NOTE — Patient Instructions (Signed)

## 2020-07-14 NOTE — Patient Instructions (Signed)
[facility] Discharge Instructions for Patients Receiving Chemotherapy  Today you received the following chemotherapy agents: Trastuzumab (Herceptin).  To help prevent nausea and vomiting after your treatment, we encourage you to take your nausea medication as prescribed. If you develop nausea and vomiting that is not controlled by your nausea medication, call the clinic.   BELOW ARE SYMPTOMS THAT SHOULD BE REPORTED IMMEDIATELY:  *FEVER GREATER THAN 100.5 F  *CHILLS WITH OR WITHOUT FEVER  NAUSEA AND VOMITING THAT IS NOT CONTROLLED WITH YOUR NAUSEA MEDICATION  *UNUSUAL SHORTNESS OF BREATH  *UNUSUAL BRUISING OR BLEEDING  TENDERNESS IN MOUTH AND THROAT WITH OR WITHOUT PRESENCE OF ULCERS  *URINARY PROBLEMS  *BOWEL PROBLEMS  UNUSUAL RASH Items with * indicate a potential emergency and should be followed up as soon as possible.  Feel free to call the clinic should you have any questions or concerns. The clinic phone number is [phone].  Please show the CHEMO ALERT CARD at check-in to the Emergency Department and triage nurse.   

## 2020-07-15 ENCOUNTER — Telehealth: Payer: Self-pay | Admitting: Oncology

## 2020-07-15 NOTE — Telephone Encounter (Signed)
Scheduled appts per 2/7 los. Pt to get updated appt calendar at next visit per appt notes.

## 2020-07-22 ENCOUNTER — Ambulatory Visit (HOSPITAL_COMMUNITY)
Admission: RE | Admit: 2020-07-22 | Discharge: 2020-07-22 | Disposition: A | Discharge status: Home / Self Care | Payer: 59 | Source: Ambulatory Visit | Attending: Adult Health | Admitting: Adult Health

## 2020-07-22 ENCOUNTER — Other Ambulatory Visit: Payer: Self-pay

## 2020-07-22 DIAGNOSIS — Z170 Estrogen receptor positive status [ER+]: Secondary | ICD-10-CM | POA: Insufficient documentation

## 2020-07-22 DIAGNOSIS — E785 Hyperlipidemia, unspecified: Secondary | ICD-10-CM | POA: Insufficient documentation

## 2020-07-22 DIAGNOSIS — I10 Essential (primary) hypertension: Secondary | ICD-10-CM | POA: Diagnosis not present

## 2020-07-22 DIAGNOSIS — C50411 Malignant neoplasm of upper-outer quadrant of right female breast: Secondary | ICD-10-CM

## 2020-07-22 DIAGNOSIS — Z853 Personal history of malignant neoplasm of breast: Secondary | ICD-10-CM | POA: Diagnosis not present

## 2020-07-22 DIAGNOSIS — K219 Gastro-esophageal reflux disease without esophagitis: Secondary | ICD-10-CM | POA: Diagnosis not present

## 2020-07-22 DIAGNOSIS — I351 Nonrheumatic aortic (valve) insufficiency: Secondary | ICD-10-CM | POA: Insufficient documentation

## 2020-07-22 DIAGNOSIS — E119 Type 2 diabetes mellitus without complications: Secondary | ICD-10-CM | POA: Diagnosis not present

## 2020-07-22 DIAGNOSIS — Z01810 Encounter for preprocedural cardiovascular examination: Secondary | ICD-10-CM | POA: Diagnosis not present

## 2020-07-22 DIAGNOSIS — G4730 Sleep apnea, unspecified: Secondary | ICD-10-CM | POA: Insufficient documentation

## 2020-07-22 LAB — ECHOCARDIOGRAM COMPLETE
Area-P 1/2: 4.06 cm2
P 1/2 time: 588 msec
S' Lateral: 4.1 cm

## 2020-07-22 NOTE — Progress Notes (Signed)
  Echocardiogram 2D Echocardiogram has been performed.  Tracy Garrett M 07/22/2020, 11:42 AM

## 2020-07-23 ENCOUNTER — Encounter: Payer: Self-pay | Admitting: Internal Medicine

## 2020-07-31 ENCOUNTER — Telehealth: Payer: Self-pay

## 2020-07-31 NOTE — Telephone Encounter (Signed)
I called the patient today about her upcoming follow-up appointment in radiation oncology.   Given the state of the COVID-19 pandemic, concerning case numbers in our community, and guidance from [facility], I offered a phone assessment with the patient to determine if coming to the clinic was necessary. She accepted.  I let the patient know that I had spoken with Dr. Isidore Moos, and she wanted them to know the importance of washing their hands for at least 20 seconds at a time, especially after going out in public, and before they eat.  Limit going out in public whenever possible. Do not touch your face, unless your hands are clean, such as when bathing. Get plenty of rest, eat well, and stay hydrated. Patient verbalized understanding and agreement.  The patient denies any symptomatic concerns. She reports her fatigue has resolved and denies any issues sleeping. She denies any lingering pain, tenderness, range of motion limitations, or swelling to her breast or axilla.  Specifically, she reports good healing of her skin in the radiation fields.  Skin is intact and hyperpigmentation appears to be fading. I recommended that she continue skin care by applying oil or lotion with vitamin E to the skin in the radiation fields, BID, for 2 more months.  Patient verbalized understanding and agreement.  Continue follow-up with medical oncology - follow-up is scheduled on 10/27/2020 with Dr. Lurline Del (she continues to receive trastuzumab in the interim, and denies any symptoms/concerns).  I explained that yearly mammograms are important for patients with intact breast tissue, and physical exams are important after mastectomy for patients that cannot undergo mammography.  I encouraged her to call if she had further questions or concerns about her healing. Otherwise, she will follow-up PRN in radiation oncology. Patient is pleased with this plan, and we will cancel her upcoming follow-up to reduce the  risk of COVID-19 transmission.

## 2020-08-01 ENCOUNTER — Ambulatory Visit: Payer: 59 | Admitting: Radiation Oncology

## 2020-08-04 ENCOUNTER — Inpatient Hospital Stay: Payer: 59

## 2020-08-04 ENCOUNTER — Encounter: Payer: Self-pay | Admitting: *Deleted

## 2020-08-04 ENCOUNTER — Other Ambulatory Visit: Payer: Self-pay

## 2020-08-04 VITALS — BP 126/73 | HR 80 | Temp 98.9°F | Resp 18 | Wt 218.2 lb

## 2020-08-04 DIAGNOSIS — Z95828 Presence of other vascular implants and grafts: Secondary | ICD-10-CM

## 2020-08-04 DIAGNOSIS — C50411 Malignant neoplasm of upper-outer quadrant of right female breast: Secondary | ICD-10-CM

## 2020-08-04 DIAGNOSIS — Z170 Estrogen receptor positive status [ER+]: Secondary | ICD-10-CM

## 2020-08-04 DIAGNOSIS — E1165 Type 2 diabetes mellitus with hyperglycemia: Secondary | ICD-10-CM

## 2020-08-04 DIAGNOSIS — E559 Vitamin D deficiency, unspecified: Secondary | ICD-10-CM

## 2020-08-04 LAB — COMPREHENSIVE METABOLIC PANEL
ALT: 11 U/L (ref 0–44)
AST: 12 U/L — ABNORMAL LOW (ref 15–41)
Albumin: 3.4 g/dL — ABNORMAL LOW (ref 3.5–5.0)
Alkaline Phosphatase: 68 U/L (ref 38–126)
Anion gap: 8 (ref 5–15)
BUN: 16 mg/dL (ref 8–23)
CO2: 25 mmol/L (ref 22–32)
Calcium: 8.7 mg/dL — ABNORMAL LOW (ref 8.9–10.3)
Chloride: 105 mmol/L (ref 98–111)
Creatinine, Ser: 1.02 mg/dL — ABNORMAL HIGH (ref 0.44–1.00)
GFR, Estimated: >60 mL/min (ref >60)
Glucose, Bld: 256 mg/dL — ABNORMAL HIGH (ref 70–99)
Potassium: 3.7 mmol/L (ref 3.5–5.1)
Sodium: 138 mmol/L (ref 135–145)
Total Bilirubin: 0.3 mg/dL (ref 0.3–1.2)
Total Protein: 6.8 g/dL (ref 6.5–8.1)

## 2020-08-04 LAB — CBC WITH DIFFERENTIAL/PLATELET
Abs Immature Granulocytes: 0.02 10*3/uL (ref 0.00–0.07)
Basophils Absolute: 0 10*3/uL (ref 0.0–0.1)
Basophils Relative: 0 %
Eosinophils Absolute: 0.3 10*3/uL (ref 0.0–0.5)
Eosinophils Relative: 4 %
HCT: 34.9 % — ABNORMAL LOW (ref 36.0–46.0)
Hemoglobin: 11.5 g/dL — ABNORMAL LOW (ref 12.0–15.0)
Immature Granulocytes: 0 %
Lymphocytes Relative: 18 %
Lymphs Abs: 1.3 10*3/uL (ref 0.7–4.0)
MCH: 30.2 pg (ref 26.0–34.0)
MCHC: 33 g/dL (ref 30.0–36.0)
MCV: 91.6 fL (ref 80.0–100.0)
Monocytes Absolute: 0.7 10*3/uL (ref 0.1–1.0)
Monocytes Relative: 9 %
Neutro Abs: 4.9 10*3/uL (ref 1.7–7.7)
Neutrophils Relative %: 69 %
Platelets: 220 10*3/uL (ref 150–400)
RBC: 3.81 MIL/uL — ABNORMAL LOW (ref 3.87–5.11)
RDW: 13.8 % (ref 11.5–15.5)
WBC: 7.3 10*3/uL (ref 4.0–10.5)
nRBC: 0 % (ref 0.0–0.2)

## 2020-08-04 MED ORDER — SODIUM CHLORIDE 0.9% FLUSH
10.0000 mL | INTRAVENOUS | Status: DC | PRN
  Administered 2020-08-04: 10 mL
  Filled 2020-08-04: qty 10

## 2020-08-04 MED ORDER — ACETAMINOPHEN 325 MG PO TABS
ORAL_TABLET | ORAL | Status: AC
  Filled 2020-08-04: qty 2

## 2020-08-04 MED ORDER — HEPARIN SOD (PORK) LOCK FLUSH 100 UNIT/ML IV SOLN
500.0000 [IU] | Freq: Once | INTRAVENOUS | Status: AC | PRN
  Administered 2020-08-04: 500 [IU]
  Filled 2020-08-04: qty 5

## 2020-08-04 MED ORDER — SODIUM CHLORIDE 0.9 % IV SOLN
Freq: Once | INTRAVENOUS | Status: AC
  Filled 2020-08-04: qty 250

## 2020-08-04 MED ORDER — DIPHENHYDRAMINE HCL 12.5 MG/5ML PO ELIX: 25.0000 mg | ORAL_SOLUTION | Freq: Once | ORAL | Status: DC

## 2020-08-04 MED ORDER — ACETAMINOPHEN 325 MG PO TABS
650.0000 mg | ORAL_TABLET | Freq: Once | ORAL | Status: AC
  Administered 2020-08-04: 650 mg via ORAL

## 2020-08-04 MED ORDER — TRASTUZUMAB-ANNS CHEMO 150 MG IV SOLR
600.0000 mg | Freq: Once | INTRAVENOUS | Status: AC
  Administered 2020-08-04: 600 mg via INTRAVENOUS
  Filled 2020-08-04: qty 28.57

## 2020-08-04 NOTE — Patient Instructions (Signed)
[facility] Discharge Instructions for Patients Receiving Chemotherapy  Today you received the following chemotherapy agents: Trastuzumab (Herceptin).  To help prevent nausea and vomiting after your treatment, we encourage you to take your nausea medication as prescribed. If you develop nausea and vomiting that is not controlled by your nausea medication, call the clinic.   BELOW ARE SYMPTOMS THAT SHOULD BE REPORTED IMMEDIATELY:  *FEVER GREATER THAN 100.5 F  *CHILLS WITH OR WITHOUT FEVER  NAUSEA AND VOMITING THAT IS NOT CONTROLLED WITH YOUR NAUSEA MEDICATION  *UNUSUAL SHORTNESS OF BREATH  *UNUSUAL BRUISING OR BLEEDING  TENDERNESS IN MOUTH AND THROAT WITH OR WITHOUT PRESENCE OF ULCERS  *URINARY PROBLEMS  *BOWEL PROBLEMS  UNUSUAL RASH Items with * indicate a potential emergency and should be followed up as soon as possible.  Feel free to call the clinic should you have any questions or concerns. The clinic phone number is [phone].  Please show the CHEMO ALERT CARD at check-in to the Emergency Department and triage nurse.   

## 2020-08-25 ENCOUNTER — Inpatient Hospital Stay: Payer: 59

## 2020-08-25 ENCOUNTER — Other Ambulatory Visit: Payer: Self-pay

## 2020-08-25 ENCOUNTER — Inpatient Hospital Stay: Payer: 59 | Attending: Oncology

## 2020-08-25 VITALS — BP 144/69 | HR 79 | Temp 98.2°F | Resp 18 | Wt 219.5 lb

## 2020-08-25 DIAGNOSIS — Z170 Estrogen receptor positive status [ER+]: Secondary | ICD-10-CM

## 2020-08-25 DIAGNOSIS — Z5112 Encounter for antineoplastic immunotherapy: Secondary | ICD-10-CM | POA: Insufficient documentation

## 2020-08-25 DIAGNOSIS — E559 Vitamin D deficiency, unspecified: Secondary | ICD-10-CM

## 2020-08-25 DIAGNOSIS — C50411 Malignant neoplasm of upper-outer quadrant of right female breast: Secondary | ICD-10-CM | POA: Diagnosis not present

## 2020-08-25 DIAGNOSIS — Z95828 Presence of other vascular implants and grafts: Secondary | ICD-10-CM

## 2020-08-25 DIAGNOSIS — E1165 Type 2 diabetes mellitus with hyperglycemia: Secondary | ICD-10-CM

## 2020-08-25 LAB — CBC WITH DIFFERENTIAL/PLATELET
Abs Immature Granulocytes: 0.03 10*3/uL (ref 0.00–0.07)
Basophils Absolute: 0 10*3/uL (ref 0.0–0.1)
Basophils Relative: 0 %
Eosinophils Absolute: 0.3 10*3/uL (ref 0.0–0.5)
Eosinophils Relative: 4 %
HCT: 35.3 % — ABNORMAL LOW (ref 36.0–46.0)
Hemoglobin: 11.6 g/dL — ABNORMAL LOW (ref 12.0–15.0)
Immature Granulocytes: 0 %
Lymphocytes Relative: 19 %
Lymphs Abs: 1.3 10*3/uL (ref 0.7–4.0)
MCH: 29.6 pg (ref 26.0–34.0)
MCHC: 32.9 g/dL (ref 30.0–36.0)
MCV: 90.1 fL (ref 80.0–100.0)
Monocytes Absolute: 0.7 10*3/uL (ref 0.1–1.0)
Monocytes Relative: 10 %
Neutro Abs: 4.7 10*3/uL (ref 1.7–7.7)
Neutrophils Relative %: 67 %
Platelets: 227 10*3/uL (ref 150–400)
RBC: 3.92 MIL/uL (ref 3.87–5.11)
RDW: 14.1 % (ref 11.5–15.5)
WBC: 7 10*3/uL (ref 4.0–10.5)
nRBC: 0 % (ref 0.0–0.2)

## 2020-08-25 LAB — COMPREHENSIVE METABOLIC PANEL
ALT: 12 U/L (ref 0–44)
AST: 12 U/L — ABNORMAL LOW (ref 15–41)
Albumin: 3.5 g/dL (ref 3.5–5.0)
Alkaline Phosphatase: 69 U/L (ref 38–126)
Anion gap: 6 (ref 5–15)
BUN: 13 mg/dL (ref 8–23)
CO2: 26 mmol/L (ref 22–32)
Calcium: 8.7 mg/dL — ABNORMAL LOW (ref 8.9–10.3)
Chloride: 105 mmol/L (ref 98–111)
Creatinine, Ser: 0.99 mg/dL (ref 0.44–1.00)
GFR, Estimated: >60 mL/min (ref >60)
Glucose, Bld: 186 mg/dL — ABNORMAL HIGH (ref 70–99)
Potassium: 4.1 mmol/L (ref 3.5–5.1)
Sodium: 137 mmol/L (ref 135–145)
Total Bilirubin: 0.5 mg/dL (ref 0.3–1.2)
Total Protein: 6.8 g/dL (ref 6.5–8.1)

## 2020-08-25 MED ORDER — TRASTUZUMAB-ANNS CHEMO 150 MG IV SOLR
600.0000 mg | Freq: Once | INTRAVENOUS | Status: AC
  Administered 2020-08-25: 600 mg via INTRAVENOUS
  Filled 2020-08-25: qty 28.57

## 2020-08-25 MED ORDER — SODIUM CHLORIDE 0.9% FLUSH
10.0000 mL | INTRAVENOUS | Status: DC | PRN
Start: 2020-08-25 — End: 2020-08-25
  Administered 2020-08-25: 10 mL
  Filled 2020-08-25: qty 10

## 2020-08-25 MED ORDER — SODIUM CHLORIDE 0.9 % IV SOLN
Freq: Once | INTRAVENOUS | Status: AC
  Filled 2020-08-25: qty 250

## 2020-08-25 MED ORDER — ACETAMINOPHEN 325 MG PO TABS
ORAL_TABLET | ORAL | Status: AC
  Filled 2020-08-25: qty 2

## 2020-08-25 MED ORDER — SODIUM CHLORIDE 0.9% FLUSH
10.0000 mL | INTRAVENOUS | Status: DC | PRN
  Administered 2020-08-25: 10 mL
  Filled 2020-08-25: qty 10

## 2020-08-25 MED ORDER — ACETAMINOPHEN 325 MG PO TABS
650.0000 mg | ORAL_TABLET | Freq: Once | ORAL | Status: AC
  Administered 2020-08-25: 650 mg via ORAL

## 2020-08-25 MED ORDER — DIPHENHYDRAMINE HCL 12.5 MG/5ML PO ELIX: 25.0000 mg | ORAL_SOLUTION | Freq: Once | ORAL | Status: DC

## 2020-08-25 MED ORDER — HEPARIN SOD (PORK) LOCK FLUSH 100 UNIT/ML IV SOLN
500.0000 [IU] | Freq: Once | INTRAVENOUS | Status: AC | PRN
  Administered 2020-08-25: 500 [IU]
  Filled 2020-08-25: qty 5

## 2020-08-25 NOTE — Patient Instructions (Signed)
[facility] Discharge Instructions for Patients Receiving Chemotherapy  Today you received the following chemotherapy agents trastuzumab.  To help prevent nausea and vomiting after your treatment, we encourage you to take your nausea medication as directed.    If you develop nausea and vomiting that is not controlled by your nausea medication, call the clinic.   BELOW ARE SYMPTOMS THAT SHOULD BE REPORTED IMMEDIATELY:  *FEVER GREATER THAN 100.5 F  *CHILLS WITH OR WITHOUT FEVER  NAUSEA AND VOMITING THAT IS NOT CONTROLLED WITH YOUR NAUSEA MEDICATION  *UNUSUAL SHORTNESS OF BREATH  *UNUSUAL BRUISING OR BLEEDING  TENDERNESS IN MOUTH AND THROAT WITH OR WITHOUT PRESENCE OF ULCERS  *URINARY PROBLEMS  *BOWEL PROBLEMS  UNUSUAL RASH Items with * indicate a potential emergency and should be followed up as soon as possible.  Feel free to call the clinic should you have any questions or concerns. The clinic phone number is [phone].  Please show the CHEMO ALERT CARD at check-in to the Emergency Department and triage nurse.   

## 2020-09-02 NOTE — Progress Notes (Deleted)
  Patient Name: Tracy Garrett MRN: [id number] DOB: 1955-06-24 Referring Physician: Lurline Del (Profile Not Attached) Date of Service: 06/30/2020 [facility] [place], [place]                                                        End Of Treatment Note  Diagnoses: C50.411-Malignant neoplasm of upper-outer quadrant of right female breast  Cancer Staging: Cancer Staging Malignant neoplasm of upper-outer quadrant of right breast in female, estrogen receptor positive ([place]) Staging form: Breast, AJCC 8th Edition - Clinical stage from 11/28/2019: Stage IA (cT1b, cN0, cM0, G3, ER+, PR+, HER2+) - Unsigned Stage prefix: Initial diagnosis Histologic grading system: 3 grade system Laterality: Right Staged by: Pathologist and managing physician Stage used in treatment planning: Yes National guidelines used in treatment planning: Yes Type of national guideline used in treatment planning: NCCN - Pathologic stage from 05/06/2020: Stage IA (pT2, pN0(i+), cM0, G2, ER+, PR+, HER2+) - Signed by Eppie Gibson, MD on 05/07/2020 Stage prefix: Initial diagnosis Histologic grading system: 3 grade system    Intent: Curative  Radiation Treatment Dates: 05/14/2020 through 06/30/2020 Site Technique Total Dose (Gy) Dose per Fx (Gy) Completed Fx Beam Energies  Chest Wall, Right: CW_Rt 3D 50/50 2 25/25 10X, 15X  Chest Wall, Right: CW_Rt_PAB_SCV 3D 45/45 1.8 25/25 10X, 15X  Chest Wall, Right: CW_Rt_Bst Electron 10/10 2 5/5 6E   Narrative: The patient tolerated radiation therapy relatively well.   Plan: The patient will follow-up with radiation oncology in 58mo or as needed.  -----------------------------------  Eppie Gibson, MD

## 2020-09-03 ENCOUNTER — Ambulatory Visit (INDEPENDENT_AMBULATORY_CARE_PROVIDER_SITE_OTHER): Payer: 59 | Admitting: Adult Health Nurse Practitioner

## 2020-09-03 ENCOUNTER — Encounter: Payer: Self-pay | Admitting: Adult Health Nurse Practitioner

## 2020-09-03 ENCOUNTER — Other Ambulatory Visit: Payer: Self-pay

## 2020-09-03 VITALS — BP 130/78 | HR 78 | Temp 97.5°F | Ht 62.0 in | Wt 222.0 lb

## 2020-09-03 DIAGNOSIS — Z0000 Encounter for general adult medical examination without abnormal findings: Secondary | ICD-10-CM

## 2020-09-03 DIAGNOSIS — Z79899 Other long term (current) drug therapy: Secondary | ICD-10-CM

## 2020-09-03 DIAGNOSIS — E1169 Type 2 diabetes mellitus with other specified complication: Secondary | ICD-10-CM

## 2020-09-03 DIAGNOSIS — K219 Gastro-esophageal reflux disease without esophagitis: Secondary | ICD-10-CM

## 2020-09-03 DIAGNOSIS — Z1389 Encounter for screening for other disorder: Secondary | ICD-10-CM

## 2020-09-03 DIAGNOSIS — N182 Chronic kidney disease, stage 2 (mild): Secondary | ICD-10-CM

## 2020-09-03 DIAGNOSIS — E559 Vitamin D deficiency, unspecified: Secondary | ICD-10-CM

## 2020-09-03 DIAGNOSIS — E119 Type 2 diabetes mellitus without complications: Secondary | ICD-10-CM

## 2020-09-03 DIAGNOSIS — Z0001 Encounter for general adult medical examination with abnormal findings: Secondary | ICD-10-CM

## 2020-09-03 DIAGNOSIS — E538 Deficiency of other specified B group vitamins: Secondary | ICD-10-CM

## 2020-09-03 DIAGNOSIS — K760 Fatty (change of) liver, not elsewhere classified: Secondary | ICD-10-CM

## 2020-09-03 DIAGNOSIS — E1122 Type 2 diabetes mellitus with diabetic chronic kidney disease: Secondary | ICD-10-CM

## 2020-09-03 DIAGNOSIS — I10 Essential (primary) hypertension: Secondary | ICD-10-CM

## 2020-09-03 NOTE — Progress Notes (Signed)
Complete Physical  Assessment and Plan: Tracy Garrett was seen today for annual exam.  Diagnoses and all orders for this visit:  Encounter for routine adult medical exam with abnormal findings Yearly  Essential hypertension Continue current medications: losartan 50mg Monitor blood pressure at home; call if consistently over 130/80 Continue DASH diet.   Reminder to go to the ER if any CP, SOB, nausea, dizziness, severe HA, changes vision/speech, left arm numbness and tingling and jaw pain. -     COMPLETE METABOLIC PANEL WITH GFR -     TSH  Hyperlipidemia associated with type 2 diabetes mellitus ([name]) Continue medications: rosuvastatin 5mg daily Discussed dietary and exercise modifications Low fat diet -     Lipid panel  Morbid obesity ([name]) Discussed dietary and exercise modifications  Controlled type 2 diabetes mellitus without complication, without long-term current use of insulin ([name]) Continue medications: Taking metformin XL 500mg, two tabs ,BID Discussed general issues about diabetes pathophysiology and management. Education: Reviewed 'ABCs' of diabetes management (respective goals in parentheses):  A1C (<7), blood pressure (<130/80), and cholesterol (LDL <70) Dietary recommendations Encouraged aerobic exercise.  Discussed foot care, check daily Yearly retinal exam Dental exam every 6 months Monitor blood glucose, discussed goal for patient -     Hemoglobin A1c -     Microalbumin / creatinine urine ratio  CKD stage 2 due to type 2 diabetes mellitus ([name]) Increase fluids  Avoid NSAIDS Blood pressure control Monitor sugars  Will continue to monitor  Gastroesophageal reflux disease without esophagitis Continue PPI/H2 blocker, diet discussed  Vitamin D deficiency Continue supplementation to maintain goal of 70-100 Taking Vitamin D 5,000 IU daily -     VITAMIN D 25 Hydroxy (Vit-D Deficiency, Fractures)  B12 deficiency -     Vitamin B12  Fatty liver disease,  nonalcoholic Discussed dietary and exercise modifications  Screening for blood or protein in urine -     Urinalysis w microscopic + reflex cultur -     Urine Culture -     REFLEXIVE URINE CULTURE  Medication management Continued   Discussed med's effects and SE's. Screening labs and tests as requested with regular follow-up as recommended. Over 40 minutes of face to face interview,  exam, counseling, chart review, and complex, high level critical decision making was performed this visit.   Future Appointments  Date Time Provider Department Center  09/15/2020  9:30 AM [facility] LAB [facility] None  09/15/2020  9:45 AM [facility] FLUSH [facility] None  09/15/2020 10:30 AM [facility] INFUSION [facility] None  10/06/2020  9:30 AM [facility] LAB [facility] None  10/06/2020  9:45 AM [facility] FLUSH [facility] None  10/06/2020 10:30 AM [facility] INFUSION [facility] None  10/27/2020  8:45 AM [facility] LAB [facility] None  10/27/2020  9:00 AM [facility] FLUSH [facility] None  10/27/2020  9:30 AM Magrinat, Gustav C, MD [facility] None  10/27/2020 10:30 AM [facility] INFUSION [facility] None  09/03/2021  3:00 PM McClanahan, Kyra, NP [facility] None     HPI  65 y.o. female  presents for a complete physical and follow up for has Hyperlipidemia associated with type 2 diabetes mellitus ([name]); Essential hypertension; Medication management; Vitamin D deficiency; Benign carcinoid tumor of duodenum; Morbid obesity ([name]); 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); Poorly controlled type 2 diabetes mellitus ([name]); Acid reflux; History of COVID-19; Proteinuria; Cyst of left kidney; Malignant neoplasm of upper-outer quadrant of right breast in female, estrogen receptor positive ([name]); Family history of breast cancer; Family history of multiple   myeloma; Genetic testing; Port-A-Cath in place; and Encounter for antineoplastic chemotherapy on their problem list.   She reports that she is  finished with Chemo therapy and will have port a cath removed in 01/2021.  She is having immunotherapy every 21 days.  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   She does endorse snoring and underwent sleep study but per patient this was negative and advised weight loss.   Patient also has GERD controlled with cimetidine.   She has hx of benign carcinoid tumor of duodenum resected in 2016.  Last colonoscopy 02/2019.  Next 5years due 2025.  BMI is Body mass index is 40.6 kg/m., she has been working on diet and exercise, has a new stationary bike, getting on 30 min 2days a week. Admits diet isn't as good as she'd like.  Wt Readings from Last 3 Encounters:  09/03/20 222 lb (100.7 kg)  08/25/20 219 lb 8 oz (99.6 kg)  08/04/20 218 lb 4 oz (99 kg)   Her blood pressure has been controlled at home, today their BP is BP: 130/78 She does workout. She denies chest pain, shortness of breath, dizziness.   She is not on cholesterol medication (off of RYRS, was declining statin but agreeable today) and denies myalgias. Her cholesterol is not at goal. The cholesterol last visit was:   Lab Results  Component Value Date   CHOL 216 (H) 11/26/2019   HDL 67 11/26/2019   LDLCALC 124 (H) 11/26/2019   TRIG 139 11/26/2019   CHOLHDL 3.2 11/26/2019   She has been working on diet and exercise for T2DM (Metformin, two tabs BID), she is not on bASA, she is not on ACE/ARB and denies increased appetite, nausea, paresthesia of the feet, polydipsia, polyuria, visual disturbances and vomiting. She does check sporadic blood sugars, fasting may range 120-150s. Last A1C in the office was:  Working to get consistency with her exercise related to her energy.  Lab Results  Component Value Date   HGBA1C 6.5 (H) 11/26/2019   Last GFR: Lab Results  Component Value Date   GFRAA >60 03/03/2020   Patient is on Vitamin D supplement.   Lab Results  Component Value Date    VD25OH 45 11/26/2019        Current Medications:  Current Outpatient Medications on File Prior to Visit  Medication Sig Dispense Refill  . aspirin EC 81 MG tablet Take 81 mg by mouth daily. Swallow whole.    . cetirizine (ZYRTEC) 10 MG tablet Take 10 mg by mouth daily.    . Cholecalciferol (VITAMIN D3) 5000 UNITS TABS Take 5,000 Units by mouth daily.     Marland Kitchen glucose blood (CONTOUR NEXT TEST) test strip Check blood sugar 1 time daily-DX-E11.9 100 each 12  . losartan (COZAAR) 50 MG tablet TAKE 1/2-1 TAB DAILY FOR BLOOD PRESSURE AND TO PROTECT KIDNEYS. 30 tablet 5  . metFORMIN (GLUCOPHAGE XR) 500 MG 24 hr tablet Take 1 tablet (500 mg total) by mouth daily with breakfast. 30 tablet 11  . Omega-3 Fatty Acids (OMEGA 3 PO) Take 1 capsule by mouth 2 (two) times daily.    . pantoprazole (PROTONIX) 40 MG tablet Take 1 tablet (40 mg total) by mouth daily. 90 tablet 0  . rosuvastatin (CRESTOR) 5 MG tablet Take     1 tablet     Daily      for Cholesterol  TAKE 1 TABLET BY MOUTH EVERY DAY FOR CHOLESTEROL 90 tablet 0  . [DISCONTINUED] prochlorperazine (COMPAZINE) 10 MG tablet Take 1 tablet (10 mg total) by mouth every 6 (six) hours as needed (Nausea or vomiting). 30 tablet 1   No current facility-administered medications on file prior to visit.   Allergies:  Allergies  Allergen Reactions  . Pecan Nut (Diagnostic)     Other reaction(s): Unknown  . Hydromorphone Rash    Arm reddened at IV med site  . Other Itching and Rash    NUTS CAUSE MOUTH TO ITCH   Medical History:  She has Hyperlipidemia associated with type 2 diabetes mellitus ([name]); Essential hypertension; Medication management; Vitamin D deficiency; Benign carcinoid tumor of duodenum; Morbid obesity ([name]); 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); Poorly controlled type 2 diabetes mellitus ([name]); Acid reflux; History of COVID-19; Proteinuria; Cyst of left kidney; Malignant neoplasm of upper-outer quadrant of right  breast in female, estrogen receptor positive ([name]); Family history of breast cancer; Family history of multiple myeloma; Genetic testing; Port-A-Cath in place; and Encounter for antineoplastic chemotherapy on their problem list. Health Maintenance:   Immunization History  Administered Date(s) Administered  . Influenza,inj,Quad PF,6+ Mos 05/14/2020  . Influenza-Unspecified 03/10/2015  . PFIZER(Purple Top)SARS-COV-2 Vaccination 07/27/2019, 08/17/2019, 05/14/2020  . PPD Test 08/31/2013, 09/02/2014, 02/28/2017  . Td 01/08/2019  . Tdap 05/05/2009    Tetanus: 01/2019 Pneumovax: n/a Prevnar 13: n/a Flu vaccine: gets at work Zostavax: declines  Pap: last remote, s/p hysterectomy, patient plans to schedule  MGM: 04/2018, DUE DEXA: N.A Colonoscopy: 02/2019, Q5 years EGD: 07/2015  Last Dental Exam: Dr. Sheeler, goes q6m, will schedule to follow  Last Eye Exam: Dr. Lyles due for 2022  Patient Care Team: McKeown, William, MD as PCP - General (Internal Medicine) Stuart, Dawn C, RN as Oncology Nurse Navigator Martini, Keisha N, RN as Oncology Nurse Navigator Cornett, Thomas, MD as Consulting Physician (General Surgery) Magrinat, Gustav C, MD as Consulting Physician (Oncology) Squire, Sarah, MD as Attending Physician (Radiation Oncology) Blackman, Christopher Y, MD as Consulting Physician (Orthopedic Surgery) Regal, Norman S, DPM as Consulting Physician (Podiatry) McLean, Dalton S, MD as Consulting Physician (Cardiology)  Surgical History:  She has a past surgical history that includes Cholecystectomy (2003); Breast surgery (1999); colonoscopsy (11/2007); Vaginal hysterectomy (2002); Tonsillectomy; EUS (N/A, 07/26/2014); laparotomy (N/A, 10/10/2014); Lysis of adhesion (10/10/2014); Cystoscopy; Mastectomy w/ sentinel node biopsy (Bilateral, 01/02/2020); and Portacath placement (Right, 01/02/2020). Family History:  Herfamily history includes Alzheimer's disease in her maternal grandmother; Breast cancer  in her maternal aunt and paternal aunt; Breast cancer (age of onset: 52) in her cousin; Breast cancer (age of onset: 59) in her cousin; Breast cancer (age of onset: 72) in her mother; CAD (age of onset: 52) in her brother; Cancer in her maternal uncle; Diabetes type I in her half-sister; Emphysema in her paternal grandfather; Heart attack in her paternal grandfather; Heart disease in her father; Lung cancer in her paternal grandfather; Multiple myeloma in her paternal aunt; Multiple myeloma (age of onset: 92) in her maternal aunt. Social History:  She reports that she has never smoked. She has never used smokeless tobacco. She reports that she does not drink alcohol and does not use drugs.  Review of Systems: Review of Systems  Constitutional: Negative for malaise/fatigue and weight loss.  HENT: Negative for congestion, ear discharge, ear pain, hearing loss, sinus pain, sore throat and tinnitus.   Eyes: Negative for blurred vision and double vision.  Respiratory:   Negative for cough, sputum production, shortness of breath and wheezing.   Cardiovascular: Negative for chest pain, palpitations, orthopnea, claudication, leg swelling and PND.  Gastrointestinal: Negative for abdominal pain, blood in stool, constipation, diarrhea, heartburn, melena, nausea and vomiting.  Genitourinary: Negative.   Musculoskeletal: Negative for falls, joint pain and myalgias.  Skin: Negative for rash.  Neurological: Negative for dizziness, tingling, sensory change, weakness and headaches.  Endo/Heme/Allergies: Positive for environmental allergies. Negative for polydipsia.  Psychiatric/Behavioral: Negative.  Negative for depression, memory loss, substance abuse and suicidal ideas. The patient is not nervous/anxious and does not have insomnia.   All other systems reviewed and are negative.   Physical Exam: Estimated body mass index is 40.6 kg/m as calculated from the following:   Height as of this encounter: 5' 2"  (1.575 m).   Weight as of this encounter: 222 lb (100.7 kg). BP 130/78   Pulse 78   Temp (!) 97.5 F (36.4 C)   Ht 5' 2" (1.575 m)   Wt 222 lb (100.7 kg)   SpO2 98%   BMI 40.60 kg/m    General Appearance: Well nourished, in no apparent distress.  Eyes: PERRLA, EOMs, conjunctiva no swelling or erythema Sinuses: No Frontal/maxillary tenderness  ENT/Mouth: Ext aud canals clear, normal light reflex with TMs without erythema, bulging. Good dentition. No erythema, swelling, or exudate on post pharynx. Tonsils not swollen or erythematous. Hearing normal.  Neck: Supple, thyroid normal. No bruits  Respiratory: Respiratory effort normal, BS equal bilaterally without rales, rhonchi, wheezing or stridor.  Cardio: RRR without murmurs, rubs or gallops. Brisk peripheral pulses without edema.  Chest: symmetric, with normal excursions and percussion.  Breasts: Defer to GYN due pt to schedule Abdomen: Soft, nontender, no guarding, rebound, hernias, masses, or organomegaly.  Lymphatics: Non tender without lymphadenopathy.  Genitourinary: Defer GYN  Musculoskeletal: Full ROM all peripheral extremities,5/5 strength, and normal gait.  Skin: Warm, dry without rashes, lesions, ecchymosis. Neuro: Cranial nerves intact, reflexes equal bilaterally. Normal muscle tone, no cerebellar symptoms. Sensation intact.  Psych: Awake and oriented X 3, normal affect, Insight and Judgment appropriate.   EKG: defer today   Kyra McClanahan, NP 3:26 PM [facility]  

## 2020-09-06 ENCOUNTER — Other Ambulatory Visit: Payer: Self-pay | Admitting: Adult Health Nurse Practitioner

## 2020-09-06 DIAGNOSIS — B9620 Unspecified Escherichia coli [E. coli] as the cause of diseases classified elsewhere: Secondary | ICD-10-CM

## 2020-09-06 DIAGNOSIS — N390 Urinary tract infection, site not specified: Secondary | ICD-10-CM

## 2020-09-06 LAB — URINALYSIS W MICROSCOPIC + REFLEX CULTURE
Bacteria, UA: NONE SEEN /HPF
Bilirubin Urine: NEGATIVE
Glucose, UA: NEGATIVE
Hgb urine dipstick: NEGATIVE
Hyaline Cast: NONE SEEN /LPF
Ketones, ur: NEGATIVE
Nitrites, Initial: NEGATIVE
Protein, ur: NEGATIVE
RBC / HPF: NONE SEEN /HPF (ref 0–2)
Specific Gravity, Urine: 1.003 (ref 1.001–1.03)
Squamous Epithelial / HPF: NONE SEEN /HPF (ref <=5)
pH: 6.5 (ref 5.0–8.0)

## 2020-09-06 LAB — COMPLETE METABOLIC PANEL WITH GFR
AG Ratio: 1.6 (calc) (ref 1.0–2.5)
ALT: 12 U/L (ref 6–29)
AST: 11 U/L (ref 10–35)
Albumin: 4.2 g/dL (ref 3.6–5.1)
Alkaline phosphatase (APISO): 76 U/L (ref 37–153)
BUN: 13 mg/dL (ref 7–25)
CO2: 30 mmol/L (ref 20–32)
Calcium: 9.3 mg/dL (ref 8.6–10.4)
Chloride: 100 mmol/L (ref 98–110)
Creat: 0.95 mg/dL (ref 0.50–0.99)
GFR, Est African American: 73 mL/min/{1.73_m2} (ref >=60)
GFR, Est Non African American: 63 mL/min/{1.73_m2} (ref >=60)
Globulin: 2.7 g/dL (calc) (ref 1.9–3.7)
Glucose, Bld: 120 mg/dL — ABNORMAL HIGH (ref 65–99)
Potassium: 4.3 mmol/L (ref 3.5–5.3)
Sodium: 139 mmol/L (ref 135–146)
Total Bilirubin: 0.5 mg/dL (ref 0.2–1.2)
Total Protein: 6.9 g/dL (ref 6.1–8.1)

## 2020-09-06 LAB — HEMOGLOBIN A1C
Hgb A1c MFr Bld: 8.7 % of total Hgb — ABNORMAL HIGH (ref <5.7)
Mean Plasma Glucose: 203 mg/dL
eAG (mmol/L): 11.2 mmol/L

## 2020-09-06 LAB — URINE CULTURE
MICRO NUMBER:: 11713740
SPECIMEN QUALITY:: ADEQUATE

## 2020-09-06 LAB — TSH: TSH: 1.32 mIU/L (ref 0.40–4.50)

## 2020-09-06 LAB — VITAMIN B12: Vitamin B-12: 1502 pg/mL — ABNORMAL HIGH (ref 200–1100)

## 2020-09-06 LAB — MICROALBUMIN / CREATININE URINE RATIO
Creatinine, Urine: 13 mg/dL — ABNORMAL LOW (ref 20–275)
Microalb Creat Ratio: 254 mcg/mg creat — ABNORMAL HIGH (ref <30)
Microalb, Ur: 3.3 mg/dL

## 2020-09-06 LAB — VITAMIN D 25 HYDROXY (VIT D DEFICIENCY, FRACTURES): Vit D, 25-Hydroxy: 40 ng/mL (ref 30–100)

## 2020-09-06 LAB — LIPID PANEL
Cholesterol: 172 mg/dL (ref <200)
HDL: 62 mg/dL (ref >=50)
LDL Cholesterol (Calc): 82 mg/dL (calc)
Non-HDL Cholesterol (Calc): 110 mg/dL (calc) (ref <130)
Total CHOL/HDL Ratio: 2.8 (calc) (ref <5.0)
Triglycerides: 183 mg/dL — ABNORMAL HIGH (ref <150)

## 2020-09-06 LAB — CULTURE INDICATED

## 2020-09-06 MED ORDER — SULFAMETHOXAZOLE-TRIMETHOPRIM 800-160 MG PO TABS: 1.0000 | ORAL_TABLET | Freq: Two times a day (BID) | ORAL | 0 refills | Status: DC

## 2020-09-15 ENCOUNTER — Inpatient Hospital Stay: Payer: 59

## 2020-09-15 ENCOUNTER — Other Ambulatory Visit: Payer: Self-pay

## 2020-09-15 ENCOUNTER — Inpatient Hospital Stay: Payer: 59 | Attending: Oncology

## 2020-09-15 VITALS — BP 133/67 | HR 91 | Temp 98.1°F | Resp 16 | Wt 217.8 lb

## 2020-09-15 DIAGNOSIS — E1165 Type 2 diabetes mellitus with hyperglycemia: Secondary | ICD-10-CM

## 2020-09-15 DIAGNOSIS — Z95828 Presence of other vascular implants and grafts: Secondary | ICD-10-CM

## 2020-09-15 DIAGNOSIS — C50411 Malignant neoplasm of upper-outer quadrant of right female breast: Secondary | ICD-10-CM

## 2020-09-15 DIAGNOSIS — Z5111 Encounter for antineoplastic chemotherapy: Secondary | ICD-10-CM | POA: Insufficient documentation

## 2020-09-15 DIAGNOSIS — Z170 Estrogen receptor positive status [ER+]: Secondary | ICD-10-CM

## 2020-09-15 DIAGNOSIS — E559 Vitamin D deficiency, unspecified: Secondary | ICD-10-CM

## 2020-09-15 LAB — COMPREHENSIVE METABOLIC PANEL
ALT: 11 U/L (ref 0–44)
AST: 11 U/L — ABNORMAL LOW (ref 15–41)
Albumin: 3.6 g/dL (ref 3.5–5.0)
Alkaline Phosphatase: 74 U/L (ref 38–126)
Anion gap: 12 (ref 5–15)
BUN: 16 mg/dL (ref 8–23)
CO2: 22 mmol/L (ref 22–32)
Calcium: 9.1 mg/dL (ref 8.9–10.3)
Chloride: 102 mmol/L (ref 98–111)
Creatinine, Ser: 1.2 mg/dL — ABNORMAL HIGH (ref 0.44–1.00)
GFR, Estimated: 51 mL/min — ABNORMAL LOW (ref >60)
Glucose, Bld: 264 mg/dL — ABNORMAL HIGH (ref 70–99)
Potassium: 4.3 mmol/L (ref 3.5–5.1)
Sodium: 136 mmol/L (ref 135–145)
Total Bilirubin: 0.3 mg/dL (ref 0.3–1.2)
Total Protein: 6.8 g/dL (ref 6.5–8.1)

## 2020-09-15 LAB — CBC WITH DIFFERENTIAL/PLATELET
Abs Immature Granulocytes: 0.04 10*3/uL (ref 0.00–0.07)
Basophils Absolute: 0 10*3/uL (ref 0.0–0.1)
Basophils Relative: 0 %
Eosinophils Absolute: 0.3 10*3/uL (ref 0.0–0.5)
Eosinophils Relative: 4 %
HCT: 36.4 % (ref 36.0–46.0)
Hemoglobin: 12.1 g/dL (ref 12.0–15.0)
Immature Granulocytes: 1 %
Lymphocytes Relative: 23 %
Lymphs Abs: 1.8 10*3/uL (ref 0.7–4.0)
MCH: 30 pg (ref 26.0–34.0)
MCHC: 33.2 g/dL (ref 30.0–36.0)
MCV: 90.1 fL (ref 80.0–100.0)
Monocytes Absolute: 0.8 10*3/uL (ref 0.1–1.0)
Monocytes Relative: 11 %
Neutro Abs: 4.7 10*3/uL (ref 1.7–7.7)
Neutrophils Relative %: 61 %
Platelets: 242 10*3/uL (ref 150–400)
RBC: 4.04 MIL/uL (ref 3.87–5.11)
RDW: 14.6 % (ref 11.5–15.5)
WBC: 7.7 10*3/uL (ref 4.0–10.5)
nRBC: 0 % (ref 0.0–0.2)

## 2020-09-15 MED ORDER — SODIUM CHLORIDE 0.9% FLUSH
10.0000 mL | INTRAVENOUS | Status: DC | PRN
  Administered 2020-09-15: 10 mL
  Filled 2020-09-15: qty 10

## 2020-09-15 MED ORDER — ACETAMINOPHEN 325 MG PO TABS
ORAL_TABLET | ORAL | Status: AC
  Filled 2020-09-15: qty 2

## 2020-09-15 MED ORDER — DIPHENHYDRAMINE HCL 12.5 MG/5ML PO ELIX: 25.0000 mg | ORAL_SOLUTION | Freq: Once | ORAL | Status: DC

## 2020-09-15 MED ORDER — DIPHENHYDRAMINE HCL 12.5 MG/5ML PO ELIX
ORAL_SOLUTION | ORAL | Status: AC
  Filled 2020-09-15: qty 5

## 2020-09-15 MED ORDER — HEPARIN SOD (PORK) LOCK FLUSH 100 UNIT/ML IV SOLN
500.0000 [IU] | Freq: Once | INTRAVENOUS | Status: AC | PRN
Start: 2020-09-15 — End: 2020-09-15
  Administered 2020-09-15: 500 [IU]
  Filled 2020-09-15: qty 5

## 2020-09-15 MED ORDER — TRASTUZUMAB-ANNS CHEMO 150 MG IV SOLR
600.0000 mg | Freq: Once | INTRAVENOUS | Status: AC
Start: 2020-09-15 — End: 2020-09-15
  Administered 2020-09-15: 600 mg via INTRAVENOUS
  Filled 2020-09-15: qty 28.57

## 2020-09-15 MED ORDER — SODIUM CHLORIDE 0.9 % IV SOLN
Freq: Once | INTRAVENOUS | Status: AC
  Filled 2020-09-15: qty 250

## 2020-09-15 MED ORDER — ACETAMINOPHEN 325 MG PO TABS
650.0000 mg | ORAL_TABLET | Freq: Once | ORAL | Status: AC
  Administered 2020-09-15: 650 mg via ORAL

## 2020-09-15 NOTE — Patient Instructions (Signed)

## 2020-09-15 NOTE — Patient Instructions (Signed)
[facility] Discharge Instructions for Patients Receiving Chemotherapy  Today you received the following chemotherapy agents: Kanjinti  To help prevent nausea and vomiting after your treatment, we encourage you to take your nausea medication as directed.    If you develop nausea and vomiting that is not controlled by your nausea medication, call the clinic.   BELOW ARE SYMPTOMS THAT SHOULD BE REPORTED IMMEDIATELY:  *FEVER GREATER THAN 100.5 F  *CHILLS WITH OR WITHOUT FEVER  NAUSEA AND VOMITING THAT IS NOT CONTROLLED WITH YOUR NAUSEA MEDICATION  *UNUSUAL SHORTNESS OF BREATH  *UNUSUAL BRUISING OR BLEEDING  TENDERNESS IN MOUTH AND THROAT WITH OR WITHOUT PRESENCE OF ULCERS  *URINARY PROBLEMS  *BOWEL PROBLEMS  UNUSUAL RASH Items with * indicate a potential emergency and should be followed up as soon as possible.  Feel free to call the clinic should you have any questions or concerns. The clinic phone number is [phone].  Please show the CHEMO ALERT CARD at check-in to the Emergency Department and triage nurse.   

## 2020-10-06 ENCOUNTER — Other Ambulatory Visit: Payer: 59

## 2020-10-06 ENCOUNTER — Other Ambulatory Visit: Payer: Self-pay

## 2020-10-06 ENCOUNTER — Inpatient Hospital Stay: Payer: 59 | Attending: Oncology

## 2020-10-06 ENCOUNTER — Inpatient Hospital Stay: Payer: 59

## 2020-10-06 VITALS — BP 125/70 | HR 75 | Temp 99.0°F | Resp 18

## 2020-10-06 DIAGNOSIS — I129 Hypertensive chronic kidney disease with stage 1 through stage 4 chronic kidney disease, or unspecified chronic kidney disease: Secondary | ICD-10-CM | POA: Insufficient documentation

## 2020-10-06 DIAGNOSIS — G4730 Sleep apnea, unspecified: Secondary | ICD-10-CM | POA: Insufficient documentation

## 2020-10-06 DIAGNOSIS — K219 Gastro-esophageal reflux disease without esophagitis: Secondary | ICD-10-CM | POA: Insufficient documentation

## 2020-10-06 DIAGNOSIS — Z806 Family history of leukemia: Secondary | ICD-10-CM | POA: Diagnosis not present

## 2020-10-06 DIAGNOSIS — Z79899 Other long term (current) drug therapy: Secondary | ICD-10-CM | POA: Insufficient documentation

## 2020-10-06 DIAGNOSIS — Z5112 Encounter for antineoplastic immunotherapy: Secondary | ICD-10-CM | POA: Insufficient documentation

## 2020-10-06 DIAGNOSIS — Z7982 Long term (current) use of aspirin: Secondary | ICD-10-CM | POA: Diagnosis not present

## 2020-10-06 DIAGNOSIS — C50411 Malignant neoplasm of upper-outer quadrant of right female breast: Secondary | ICD-10-CM

## 2020-10-06 DIAGNOSIS — N189 Chronic kidney disease, unspecified: Secondary | ICD-10-CM | POA: Insufficient documentation

## 2020-10-06 DIAGNOSIS — Z803 Family history of malignant neoplasm of breast: Secondary | ICD-10-CM | POA: Diagnosis not present

## 2020-10-06 DIAGNOSIS — Z170 Estrogen receptor positive status [ER+]: Secondary | ICD-10-CM

## 2020-10-06 DIAGNOSIS — Z853 Personal history of malignant neoplasm of breast: Secondary | ICD-10-CM | POA: Diagnosis not present

## 2020-10-06 DIAGNOSIS — E119 Type 2 diabetes mellitus without complications: Secondary | ICD-10-CM | POA: Insufficient documentation

## 2020-10-06 DIAGNOSIS — Z7984 Long term (current) use of oral hypoglycemic drugs: Secondary | ICD-10-CM | POA: Diagnosis not present

## 2020-10-06 DIAGNOSIS — E785 Hyperlipidemia, unspecified: Secondary | ICD-10-CM | POA: Diagnosis not present

## 2020-10-06 DIAGNOSIS — Z79811 Long term (current) use of aromatase inhibitors: Secondary | ICD-10-CM | POA: Diagnosis not present

## 2020-10-06 DIAGNOSIS — E1165 Type 2 diabetes mellitus with hyperglycemia: Secondary | ICD-10-CM

## 2020-10-06 DIAGNOSIS — E559 Vitamin D deficiency, unspecified: Secondary | ICD-10-CM

## 2020-10-06 LAB — COMPREHENSIVE METABOLIC PANEL
ALT: 8 U/L (ref 0–44)
AST: 15 U/L (ref 15–41)
Albumin: 3.6 g/dL (ref 3.5–5.0)
Alkaline Phosphatase: 70 U/L (ref 38–126)
Anion gap: 10 (ref 5–15)
BUN: 16 mg/dL (ref 8–23)
CO2: 27 mmol/L (ref 22–32)
Calcium: 9.1 mg/dL (ref 8.9–10.3)
Chloride: 103 mmol/L (ref 98–111)
Creatinine, Ser: 1.05 mg/dL — ABNORMAL HIGH (ref 0.44–1.00)
GFR, Estimated: 59 mL/min — ABNORMAL LOW (ref >60)
Glucose, Bld: 166 mg/dL — ABNORMAL HIGH (ref 70–99)
Potassium: 4.1 mmol/L (ref 3.5–5.1)
Sodium: 140 mmol/L (ref 135–145)
Total Bilirubin: 0.6 mg/dL (ref 0.3–1.2)
Total Protein: 6.9 g/dL (ref 6.5–8.1)

## 2020-10-06 LAB — CBC WITH DIFFERENTIAL/PLATELET
Abs Immature Granulocytes: 0.03 10*3/uL (ref 0.00–0.07)
Basophils Absolute: 0 10*3/uL (ref 0.0–0.1)
Basophils Relative: 0 %
Eosinophils Absolute: 0.3 10*3/uL (ref 0.0–0.5)
Eosinophils Relative: 3 %
HCT: 35.4 % — ABNORMAL LOW (ref 36.0–46.0)
Hemoglobin: 11.4 g/dL — ABNORMAL LOW (ref 12.0–15.0)
Immature Granulocytes: 0 %
Lymphocytes Relative: 19 %
Lymphs Abs: 1.5 10*3/uL (ref 0.7–4.0)
MCH: 29.5 pg (ref 26.0–34.0)
MCHC: 32.2 g/dL (ref 30.0–36.0)
MCV: 91.7 fL (ref 80.0–100.0)
Monocytes Absolute: 0.7 10*3/uL (ref 0.1–1.0)
Monocytes Relative: 9 %
Neutro Abs: 5.5 10*3/uL (ref 1.7–7.7)
Neutrophils Relative %: 69 %
Platelets: 225 10*3/uL (ref 150–400)
RBC: 3.86 MIL/uL — ABNORMAL LOW (ref 3.87–5.11)
RDW: 14.7 % (ref 11.5–15.5)
WBC: 8 10*3/uL (ref 4.0–10.5)
nRBC: 0 % (ref 0.0–0.2)

## 2020-10-06 MED ORDER — ACETAMINOPHEN 325 MG PO TABS
650.0000 mg | ORAL_TABLET | Freq: Once | ORAL | Status: AC
  Administered 2020-10-06: 650 mg via ORAL

## 2020-10-06 MED ORDER — DIPHENHYDRAMINE HCL 12.5 MG/5ML PO ELIX: 25.0000 mg | ORAL_SOLUTION | Freq: Once | ORAL | Status: DC

## 2020-10-06 MED ORDER — HEPARIN SOD (PORK) LOCK FLUSH 100 UNIT/ML IV SOLN
500.0000 [IU] | Freq: Once | INTRAVENOUS | Status: AC | PRN
Start: 2020-10-06 — End: 2020-10-06
  Administered 2020-10-06: 500 [IU]
  Filled 2020-10-06: qty 5

## 2020-10-06 MED ORDER — SODIUM CHLORIDE 0.9 % IV SOLN
Freq: Once | INTRAVENOUS | Status: AC
  Filled 2020-10-06: qty 250

## 2020-10-06 MED ORDER — TRASTUZUMAB-ANNS CHEMO 150 MG IV SOLR
600.0000 mg | Freq: Once | INTRAVENOUS | Status: AC
  Administered 2020-10-06: 600 mg via INTRAVENOUS
  Filled 2020-10-06: qty 28.57

## 2020-10-06 MED ORDER — DIPHENHYDRAMINE HCL 12.5 MG/5ML PO ELIX
ORAL_SOLUTION | ORAL | Status: AC
  Filled 2020-10-06: qty 5

## 2020-10-06 MED ORDER — SODIUM CHLORIDE 0.9% FLUSH
10.0000 mL | INTRAVENOUS | Status: DC | PRN
  Administered 2020-10-06: 10 mL
  Filled 2020-10-06: qty 10

## 2020-10-06 MED ORDER — ACETAMINOPHEN 325 MG PO TABS
ORAL_TABLET | ORAL | Status: AC
  Filled 2020-10-06: qty 2

## 2020-10-06 NOTE — Progress Notes (Signed)
Pt needs f/u echo scheduled. Per Dr Jana Hakim, [place] to treat. Echo will be ordered and scheduled for pt. [name], RN aware.

## 2020-10-06 NOTE — Patient Instructions (Signed)
[facility]ICAL [facility]COL[facility]  Discharge Instructions: Thank you for choosing [facility] to provide your oncology and hematology care.   If you have a lab appointment with the Cancer Center, please go directly to the Cancer Center and check in at the registration area.   Wear comfortable clothing and clothing appropriate for easy access to any Portacath or PICC line.   We strive to give you quality time with your provider. You may need to reschedule your appointment if you arrive late (15 or more minutes).  Arriving late affects you and other patients whose appointments are after yours.  Also, if you miss three or more appointments without notifying the office, you may be dismissed from the clinic at the provider's discretion.      For prescription refill requests, have your pharmacy contact our office and allow 72 hours for refills to be completed.    Today you received the following chemotherapy and/or immunotherapy agents trastuzumab      To help prevent nausea and vomiting after your treatment, we encourage you to take your nausea medication as directed.  BELOW ARE SYMPTOMS THAT SHOULD BE REPORTED IMMEDIATELY: *FEVER GREATER THAN 100.4 F (38 C) OR HIGHER *CHILLS OR SWEATING *NAUSEA AND VOMITING THAT IS NOT CONTROLLED WITH YOUR NAUSEA MEDICATION *UNUSUAL SHORTNESS OF BREATH *UNUSUAL BRUISING OR BLEEDING *URINARY PROBLEMS (pain or burning when urinating, or frequent urination) *BOWEL PROBLEMS (unusual diarrhea, constipation, pain near the anus) TENDERNESS IN MOUTH AND THROAT WITH OR WITHOUT PRESENCE OF ULCERS (sore throat, sores in mouth, or a toothache) UNUSUAL RASH, SWELLING OR PAIN  UNUSUAL VAGINAL DISCHARGE OR ITCHING   Items with * indicate a potential emergency and should be followed up as soon as possible or go to the Emergency Department if any problems should occur.  Please show the CHEMOTHERAPY ALERT CARD or IMMUNOTHERAPY ALERT CARD at check-in to  the Emergency Department and triage nurse.  Should you have questions after your visit or need to cancel or reschedule your appointment, please contact [facility]  Dept: [phone]  and follow the prompts.  Office hours are 8:00 a.m. to 4:30 p.m. Monday - Friday. Please note that voicemails left after 4:00 p.m. may not be returned until the following business day.  We are closed weekends and major holidays. You have access to a nurse at all times for urgent questions. Please call the main number to the clinic Dept: [phone] and follow the prompts.   For any non-urgent questions, you may also contact your provider using MyChart. We now offer e-Visits for anyone 18 and older to request care online for non-urgent symptoms. For details visit [email or web address].   Also download the MyChart app! Go to the app store, search "MyChart", open the app, select [facility], and log in with your MyChart username and password.  Due to Covid, a mask is required upon entering the hospital/clinic. If you do not have a mask, one will be given to you upon arrival. For doctor visits, patients may have 1 support person aged 18 or older with them. For treatment visits, patients cannot have anyone with them due to current Covid guidelines and our immunocompromised population.   

## 2020-10-07 ENCOUNTER — Other Ambulatory Visit: Payer: Self-pay | Admitting: *Deleted

## 2020-10-07 DIAGNOSIS — E119 Type 2 diabetes mellitus without complications: Secondary | ICD-10-CM

## 2020-10-07 MED ORDER — METFORMIN HCL ER 500 MG PO TB24: ORAL_TABLET | ORAL | 0 refills | Status: DC

## 2020-10-14 ENCOUNTER — Other Ambulatory Visit: Payer: Self-pay | Admitting: Adult Health

## 2020-10-14 ENCOUNTER — Other Ambulatory Visit: Payer: Self-pay

## 2020-10-14 ENCOUNTER — Ambulatory Visit (HOSPITAL_COMMUNITY)
Admission: RE | Admit: 2020-10-14 | Discharge: 2020-10-14 | Disposition: A | Discharge status: Home / Self Care | Payer: 59 | Source: Ambulatory Visit | Attending: Oncology | Admitting: Oncology

## 2020-10-14 DIAGNOSIS — Z0189 Encounter for other specified special examinations: Secondary | ICD-10-CM | POA: Diagnosis not present

## 2020-10-14 DIAGNOSIS — I351 Nonrheumatic aortic (valve) insufficiency: Secondary | ICD-10-CM | POA: Diagnosis not present

## 2020-10-14 DIAGNOSIS — E785 Hyperlipidemia, unspecified: Secondary | ICD-10-CM | POA: Insufficient documentation

## 2020-10-14 DIAGNOSIS — E119 Type 2 diabetes mellitus without complications: Secondary | ICD-10-CM | POA: Insufficient documentation

## 2020-10-14 DIAGNOSIS — I10 Essential (primary) hypertension: Secondary | ICD-10-CM | POA: Insufficient documentation

## 2020-10-14 DIAGNOSIS — Z170 Estrogen receptor positive status [ER+]: Secondary | ICD-10-CM | POA: Insufficient documentation

## 2020-10-14 DIAGNOSIS — C50411 Malignant neoplasm of upper-outer quadrant of right female breast: Secondary | ICD-10-CM | POA: Diagnosis not present

## 2020-10-14 LAB — ECHOCARDIOGRAM COMPLETE
Area-P 1/2: 3.42 cm2
MV M vel: 4.55 m/s
MV Peak grad: 82.8 mmHg
P 1/2 time: 727 msec
S' Lateral: 3.2 cm

## 2020-10-14 NOTE — Progress Notes (Signed)
  Echocardiogram 2D Echocardiogram has been performed.  Tracy Garrett 10/14/2020, 9:59 AM

## 2020-10-23 NOTE — Progress Notes (Signed)
[place]  Telephone:([phone] [phone]     ID: Tracy Garrett DOB: 1956-03-03  MR#: [id number]  SUP#:[id number]  Patient Care Team: Unk Pinto, MD as PCP - General (Internal Medicine) [name], RN as Oncology Nurse Navigator [name], RN as Oncology Nurse Navigator Erroll Luna, MD as Consulting Physician (General Surgery) Manual Navarra, Virgie Dad, MD as Consulting Physician (Oncology) Eppie Gibson, MD as Attending Physician (Radiation Oncology) Mcarthur Rossetti, MD as Consulting Physician (Orthopedic Surgery) Regal, Tamala Fothergill, DPM as Consulting Physician (Podiatry) Larey Dresser, MD as Consulting Physician (Cardiology) Chauncey Cruel, MD OTHER MD:  CHIEF COMPLAINT: triple positive breast cancer (s/p bilateral mastectomies)  CURRENT TREATMENT: Adjuvant immunotherapy; anastrozole   INTERVAL HISTORY: Tracy Garrett returns today for follow up and treatment of her triple positive breast cancer. She continues on Trastuzumab every 3 weeks, with a dose due today.  Since her last visit, she underwent repeat echocardiogram on 10/14/2020 showing an ejection fraction of 55-60%.   REVIEW OF SYSTEMS: Tracy Garrett is tolerating the trastuzumab with no side effects that she is aware of.  The port is working well.  She is still working from home.  She is being extremely careful regarding exposures to [place].  She is not able to do a lot of walking because of problems with her left heel.  She occasionally gets on her bike.  A detailed review of systems today was otherwise stable.   COVID 19 VACCINATION STATUS: fully vaccinated [employer]), with booster 05/2020   HISTORY OF CURRENT ILLNESS: From the original intake note:  Tracy Garrett had routine screening mammography on 05/01/2020 showing a possible abnormality in the right breast. She underwent right diagnostic mammography with tomography at [facility] on 05/09/2019 showing: breast  density category A; probably-benign regional calcifications in the right breast at 11 o'clock. Short term follow up was recommended.   She returned for follow up at [facility] on 11/13/2019. She underwent right diagnostic mammography and right breast ultrasonography showing: breast composition A; indeterminate calcifications in upper-outer right breast spanning 5 cm; 0.8 cm irregular mass in right breast at 9 o'clock.  Accordingly on 11/20/2019 she proceeded to biopsy of the right breast areas in question. The pathology from this procedure ([id number]) showed:  1. Right breast, UOQ  - foci of invasive and in situ mammary carcinoma, grade 2-3, e-cadherin positive.  2. Right breast, 9 o'clock  - invasive mammary carcinoma, grade 2-3, e-cadherin positive  - Prognostic indicators significant for: estrogen receptor, 95% positive and progesterone receptor, 25% positive, both with strong staining intensity. Proliferation marker Ki67 at 20%. HER2 equivocal by immunohistochemistry (2+), but positive by fluorescent in situ hybridization with a signals ratio 3.30 and number per cell 7.25.  The patient's subsequent history is as detailed below.   PAST MEDICAL HISTORY: Past Medical History:  Diagnosis Date  . Allergy   . Anemia   . Arthritis   . Cancer [facility])    right breast cancer  . Cataracts, bilateral   . Chronic kidney disease    CKD  . Family history of breast cancer   . Family history of multiple myeloma   . Fatty liver disease, nonalcoholic 8/63/8177   [place] appears normal on follow up US 09/2019  . GERD (gastroesophageal reflux disease)   . History of kidney stones    x2 episodes  . Hyperlipidemia   . Hypertension   . PONV (postoperative nausea and vomiting)   . Sleep apnea    test was  dx, as mild- no further tx. required  . Type II or unspecified type diabetes mellitus without mention of complication, not stated as uncontrolled     PAST SURGICAL HISTORY: Past Surgical History:  Procedure  Laterality Date  . BREAST SURGERY  1999   Reduction Mammoplasties  . CHOLECYSTECTOMY  2003  . colonoscopsy  11/2007   neg- polyp removal with both procedures x2  . CYSTOSCOPY     surgery to extract stone x 1 with stent  . EUS N/A 07/26/2014   Procedure: UPPER ENDOSCOPIC ULTRASOUND (EUS) LINEAR;  Surgeon: Beryle Beams, MD;  Location: [facility];  Service: Endoscopy;  Laterality: N/A;  . 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 LYSIS OF ADHESION  10/10/2014   Procedure: LYSIS OF ADHESION;  Surgeon: TJackolyn Confer MD;  Location: [facility];  Service: General;;  . MASTECTOMY W/ SENTINEL NODE BIOPSY Bilateral 01/02/2020   Procedure: BILATERAL MASTECTOMY WITH RIGHT SENTINEL LYMPH NODE MAPPING;  Surgeon: CErroll Luna MD;  [facility]  Service: General;  Laterality: Bilateral;  PEC BLOCK  . PORTACATH PLACEMENT Right 01/02/2020   Procedure: INSERTION PORT-A-CATH WITH ULTRASOUND GUIDANCE;  Surgeon: CErroll Luna MD;  [facility]  Service: General;  Laterality: Right;  . TONSILLECTOMY     and adenoids removed age [age] . VAGINAL HYSTERECTOMY  2002    FAMILY HISTORY: Family History  Problem Relation Age of Onset  . Breast cancer Mother 720 . Heart disease Father        smoker  . Diabetes type I Half-Sister   . Alzheimer's disease Maternal Grandmother   . Emphysema Paternal Grandfather        smoker  . Heart attack Paternal Grandfather   . Lung cancer Paternal Grandfather   . CAD Brother 512      stents  . Breast cancer Maternal Aunt        dx. in her late 763s . Breast cancer Cousin 555 . Cancer Maternal Uncle        dx. in his 664s tumor on the back of neck  . Breast cancer Paternal Aunt        dx. >50  . Multiple myeloma Maternal Aunt 92  . Multiple myeloma Paternal Aunt        dx. >50  . Breast cancer Cousin 547  Her  father died at age 45873 Her mother died at age 65 She has 2 brothers and had 1 sister who is deceased. She reports breast cancer on her maternal side-- her mother at age 65 an aunt, and a cousin.   GYNECOLOGIC HISTORY:  No LMP recorded. Patient has had a hysterectomy. Menarche: 65years old Age at first live birth: 65years old GLake LatonkaP 2 LMP unsure Contraceptive: never used HRT never used  Hysterectomy? Yes, 2002 BSO? 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 65 [place]     ADVANCED DIRECTIVES: In the absence of any documentation to the contrary, the patient's spouse is their HCPOA.    HEALTH MAINTENANCE: Social History   Tobacco Use  . Smoking status: Never Smoker  . Smokeless tobacco:  Never Used  Vaping Use  . Vaping Use: Never used  Substance Use Topics  . Alcohol use: No  . Drug use: No     Colonoscopy: 02/2019 (Dr. Collene Mares), repeat due 2025  PAP: none on file (s/p hysterectomy)  Bone density: never done   Allergies  Allergen Reactions  . Pecan Nut (Diagnostic)     Other reaction(s): Unknown  . Hydromorphone Rash    Arm reddened at IV med site  . Other Itching and Rash    NUTS CAUSE MOUTH TO [facility]    Current Outpatient Medications  Medication Sig Dispense Refill  . anastrozole (ARIMIDEX) 1 MG tablet Take 1 tablet (1 mg total) by mouth daily. 90 tablet 4  . aspirin EC 81 MG tablet Take 81 mg by mouth daily. Swallow whole.    . cetirizine (ZYRTEC) 10 MG tablet Take 10 mg by mouth daily.    . Cholecalciferol (VITAMIN D3) 5000 UNITS TABS Take 5,000 Units by mouth daily.     Marland Kitchen glucose blood (CONTOUR NEXT TEST) test strip Check blood sugar 1 time daily-DX-E11.9 100 each 12  . losartan (COZAAR) 50 MG tablet TAKE 1/2-1 TAB DAILY FOR BLOOD PRESSURE AND TO PROTECT  KIDNEYS. 30 tablet 5  . metFORMIN (GLUCOPHAGE XR) 500 MG 24 hr tablet Take 2 tablets with breakfast and 2 tablets with dinner for blood sugar. 360 tablet 0  . Omega-3 Fatty Acids (OMEGA 3 PO) Take 1 capsule by mouth 2 (two) times daily.    . pantoprazole (PROTONIX) 40 MG tablet Take  1 tablet  Daily  to prevent Heartburn & Indigestion 90 tablet 3  . rosuvastatin (CRESTOR) 5 MG tablet Take     1 tablet     Daily      for Cholesterol                     TAKE 1 TABLET BY MOUTH EVERY DAY FOR CHOLESTEROL 90 tablet 0   No current facility-administered medications for this visit.    OBJECTIVE: African-American woman in no acute distress  Vitals:   10/27/20 0940  BP: 138/79  Pulse: 80  Resp: 18  Temp: 97.7 F (36.5 C)  SpO2: 99%     Body mass index is 39.67 kg/m.   Wt Readings from Last 3 Encounters:  10/27/20 216 lb 14.4 oz (98.4 kg)  09/15/20 217 lb 12.8 oz (98.8 kg)  09/03/20 222 lb (100.7 kg)     ECOG FS:1 - Symptomatic but completely ambulatory  Sclerae unicteric, EOMs intact Wearing a mask No cervical or supraclavicular adenopathy Lungs no rales or rhonchi Heart regular rate and rhythm Abd soft, nontender, positive bowel sounds MSK no focal spinal tenderness, no upper extremity lymphedema Neuro: nonfocal, well oriented, appropriate affect Breasts: Status post bilateral mastectomies.  There is no evidence of local recurrence.  The radiated side is hyper pigmented.  Both axillae are benign.   LAB RESULTS:  CMP     Component Value Date/Time   NA 139 10/27/2020 0912   K 4.0 10/27/2020 0912   CL 102 10/27/2020 0912   CO2 26 10/27/2020 0912   GLUCOSE 154 (H) 10/27/2020 0912   BUN 15 10/27/2020 0912   CREATININE 0.98 10/27/2020 0912   CREATININE 0.95 09/03/2020 1558   CALCIUM 9.2 10/27/2020 0912   PROT 7.2 10/27/2020 0912   ALBUMIN 3.8 10/27/2020 0912   AST 11 (L) 10/27/2020 0912   AST 10 (L) 11/28/2019 1217   ALT 11 10/27/2020 0912  ALT 16 11/28/2019 1217   ALKPHOS  68 10/27/2020 0912   BILITOT 0.6 10/27/2020 0912   BILITOT 0.6 11/28/2019 1217   GFRNONAA >60 10/27/2020 0912   GFRNONAA 63 09/03/2020 1558   GFRAA 73 09/03/2020 1558    Lab Results  Component Value Date   ALBUMINELP 3.9 08/23/2019   A1GS 0.4 (H) 08/23/2019   A2GS 0.9 08/23/2019   BETS 0.5 08/23/2019   BETA2SER 0.4 08/23/2019   GAMS 0.9 08/23/2019   SPEI  08/23/2019     Comment:     . Alpha-1 globulin increase noted. .     Lab Results  Component Value Date   WBC 7.7 10/27/2020   NEUTROABS 5.1 10/27/2020   HGB 11.8 (L) 10/27/2020   HCT 36.0 10/27/2020   MCV 90.7 10/27/2020   PLT 233 10/27/2020    No results found for: LABCA2  No components found for: [id number]  No results for input(s): INR in the last 168 hours.  No results found for: LABCA2  No results found for: HBZ169  No results found for: CVE938  No results found for: BOF751  No results found for: CA2729  No components found for: HGQUANT  No results found for: CEA1 / No results found for: CEA1   No results found for: AFPTUMOR  No results found for: CHROMOGRNA  No results found for: KPAFRELGTCHN, LAMBDASER, KAPLAMBRATIO (kappa/lambda light chains)  No results found for: HGBA, HGBA2QUANT, HGBFQUANT, HGBSQUAN (Hemoglobinopathy evaluation)   No results found for: LDH  Lab Results  Component Value Date   IRON 54 11/26/2019   TIBC 378 11/26/2019   IRONPCTSAT 14 (L) 11/26/2019   (Iron and TIBC)  Lab Results  Component Value Date   FERRITIN 91 05/21/2015    Urinalysis    Component Value Date/Time   COLORURINE YELLOW 09/03/2020 1558   APPEARANCEUR CLEAR 09/03/2020 1558   LABSPEC 1.003 09/03/2020 1558   PHURINE 6.5 09/03/2020 1558   GLUCOSEU NEGATIVE 09/03/2020 1558   HGBUR NEGATIVE 09/03/2020 1558   BILIRUBINUR NEG 11/22/2013 1031   BILIRUBINUR neg 10/28/2013 1159   KETONESUR NEGATIVE 09/03/2020 1558   PROTEINUR NEGATIVE 09/03/2020 1558   UROBILINOGEN 0.2 11/22/2013 1031    NITRITE NEGATIVE 08/13/2019 1359   LEUKOCYTESUR 1+ (A) 08/13/2019 1359    STUDIES: ECHOCARDIOGRAM COMPLETE  Result Date: 10/14/2020    ECHOCARDIOGRAM REPORT   Patient Name:   Tracy Garrett Date of Exam: 10/14/2020 Medical Rec #:  [id number]           Height:       62.0 in Accession #:    [id number]          Weight:       217.8 lb Date of Birth:  July 02, 1955          BSA:          1.982 m Patient Age:    30 years            BP:           155/83 mmHg Patient Gender: F                   HR:           73 bpm. Exam Location:  Outpatient Procedure: 2D Echo, Cardiac Doppler, Color Doppler and Strain Analysis Indications:    Chemo Z09  History:        Patient has prior history of Echocardiogram examinations, most  recent 07/22/2020. Risk Factors:Diabetes, Hypertension and                 Dyslipidemia.  Sonographer:    Bernadene Person RDCS Referring Phys: [place]  1. Left ventricular ejection fraction, by estimation, is 55 to 60%. The left ventricle has normal function. The left ventricle has no regional wall motion abnormalities. Left ventricular diastolic parameters are consistent with Grade II diastolic dysfunction (pseudonormalization). The average left ventricular global longitudinal strain is -20.9 %. The global longitudinal strain is normal.  2. Right ventricular systolic function is normal. The right ventricular size is normal. There is normal pulmonary artery systolic pressure.  3. The mitral valve is normal in structure. Mild mitral valve regurgitation. No evidence of mitral stenosis.  4. The aortic valve is tricuspid. Aortic valve regurgitation is mild. No aortic stenosis is present.  5. The inferior vena cava is normal in size with greater than 50% respiratory variability, suggesting right atrial pressure of 3 mmHg. FINDINGS  Left Ventricle: Left ventricular ejection fraction, by estimation, is 55 to 60%. The left ventricle has normal function. The left ventricle  has no regional wall motion abnormalities. The average left ventricular global longitudinal strain is -20.9 %. The global longitudinal strain is normal. The left ventricular internal cavity size was normal in size. There is no left ventricular hypertrophy. Left ventricular diastolic parameters are consistent with Grade II diastolic dysfunction (pseudonormalization). Right Ventricle: The right ventricular size is normal. No increase in right ventricular wall thickness. Right ventricular systolic function is normal. There is normal pulmonary artery systolic pressure. The tricuspid regurgitant velocity is 2.57 m/s, and  with an assumed right atrial pressure of 3 mmHg, the estimated right ventricular systolic pressure is 54.4 mmHg. Left Atrium: Left atrial size was normal in size. Right Atrium: Right atrial size was normal in size. Pericardium: There is no evidence of pericardial effusion. Mitral Valve: The mitral valve is normal in structure. Mild mitral valve regurgitation. No evidence of mitral valve stenosis. Tricuspid Valve: The tricuspid valve is normal in structure. Tricuspid valve regurgitation is mild . No evidence of tricuspid stenosis. Aortic Valve: The aortic valve is tricuspid. Aortic valve regurgitation is mild. Aortic regurgitation PHT measures 727 msec. No aortic stenosis is present. Pulmonic Valve: The pulmonic valve was normal in structure. Pulmonic valve regurgitation is not visualized. No evidence of pulmonic stenosis. Aorta: The aortic root is normal in size and structure. Venous: The inferior vena cava is normal in size with greater than 50% respiratory variability, suggesting right atrial pressure of 3 mmHg. IAS/Shunts: No atrial level shunt detected by color flow Doppler.  LEFT VENTRICLE PLAX 2D LVIDd:         4.50 cm  Diastology LVIDs:         3.20 cm  LV e' medial:    8.33 cm/s LV PW:         0.80 cm  LV E/e' medial:  13.6 LV IVS:        1.00 cm  LV e' lateral:   9.38 cm/s LVOT diam:     2.10  cm  LV E/e' lateral: 12.0 LV SV:         63 LV SV Index:   32       2D Longitudinal Strain LVOT Area:     3.46 cm 2D Strain GLS (A2C):   -18.7 %  2D Strain GLS (A3C):   -21.5 %                         2D Strain GLS (A4C):   -22.5 %                         2D Strain GLS Avg:     -20.9 % RIGHT VENTRICLE RV S prime:     9.75 cm/s LEFT ATRIUM             Index       RIGHT ATRIUM           Index LA diam:        3.10 cm 1.56 cm/m  RA Area:     10.50 cm LA Vol (A2C):   31.5 ml 15.89 ml/m RA Volume:   23.10 ml  11.65 ml/m LA Vol (A4C):   33.0 ml 16.65 ml/m LA Biplane Vol: 32.5 ml 16.40 ml/m  AORTIC VALVE LVOT Vmax:   94.70 cm/s LVOT Vmean:  68.100 cm/s LVOT VTI:    0.183 m AI PHT:      727 msec  AORTA Ao Root diam: 3.30 cm Ao Asc diam:  3.20 cm MITRAL VALVE                TRICUSPID VALVE MV Area (PHT): 3.42 cm     TR Peak grad:   26.4 mmHg MV Decel Time: 222 msec     TR Vmax:        257.00 cm/s MR Peak grad: 82.8 mmHg MR Mean grad: 53.0 mmHg     SHUNTS MR Vmax:      455.00 cm/s   Systemic VTI:  0.18 m MR Vmean:     341.0 cm/s    Systemic Diam: 2.10 cm MV E velocity: 113.00 cm/s MV A velocity: 67.30 cm/s MV E/A ratio:  1.68 Skeet Latch MD Electronically signed by Skeet Latch MD Signature Date/Time: 10/14/2020/12:42:15 PM    Final      ELIGIBLE FOR AVAILABLE RESEARCH PROTOCOL: AET  ASSESSMENT: 65 y.o. [place] woman status post right breast upper outer quadrant biopsy 11/20/2019 for a clinical mT1b N0, stage IA invasive ductal carcinoma, grade 2 or 3, estrogen and progesterone receptor positive, HER-2 amplified, with an MIB-1 of 20%  (1) status post bilateral mastectomies 01/02/2020 showing  (a) on the right, an mpT2 pN0(i+), stage IB invasive ductal carcinoma, triple positive, with close but negative margins; 2 sentinel lymph nodes removed  (b) on the left, no malignancy identified  (c) the patient is not planning on reconstruction  (2) adjuvant chemo immunotherapy  consisting of Abraxane weekly x12 with trastuzumab started 02/04/2020 completed 04/21/2020  (a) echo 12/13/2019 shows an ejection fraction in the 55-60% range  (b) echo 04/03/2020 shows an ejection fraction in the 55-60% range.  (c) echos 07/22/2020 and 10/14/2020 show no change in EF  (3) trastuzumab to be continued to total 1 year (through August 2022)  (4) adjuvant radiation 05/14/2020 through 06/30/2020 Site Technique Total Dose (Gy) Dose per Fx (Gy) Completed Fx Beam Energies  Chest Wall, Right: CW_Rt 3D 50/50 2 25/25 10X, 15X  Chest Wall, Right: CW_Rt_PAB_SCV 3D 45/45 1.8 25/25 10X, 15X  Chest Wall, Right: CW_Rt_Bst Electron 10/10 2 5/5 6E   (5) genetics testing 12/16/2019 through the [facility] Breast Cancer STAT panel + Common Hereditary Cancers panel found no deleterious mutations in ATM, BRCA1, BRCA2, CDH1, CHEK2, PALB2, PTEN,  STK11 and TP53. The Common Hereditary Cancers Panel offered by [facility] includes sequencing and/or deletion duplication testing of the following 48 genes: APC, ATM, AXIN2, BARD1, BMPR1A, BRCA1, BRCA2, BRIP1, CDH1, CDK4, CDKN2A (p14ARF), CDKN2A (p16INK4a), CHEK2, CTNNA1, DICER1, EPCAM (Deletion/duplication testing only), GREM1 (promoter region deletion/duplication testing only), KIT, MEN1, MLH1, MSH2, MSH3, MSH6, MUTYH, NBN, NF1, NTHL1, PALB2, PDGFRA, PMS2, POLD1, POLE, PTEN, RAD50, RAD51C, RAD51D, RNF43, SDHB, SDHC, SDHD, SMAD4, SMARCA4. STK11, TP53, TSC1, TSC2, and VHL.  The following genes were evaluated for sequence changes only: SDHA and HOXB13 c.251G>A variant only.  (a) Two variants of uncertain significance (VUS) were detected - one in the BRIP1 gene called c.1735C>T and a second in the POLD1 gene called c.2861C>G.   (6) anastrozole started 10/27/2020  (a) [facility] density at [facility] 06/25/2020 shows a T score of -1.1  PLAN: Tracy Garrett is now close to a year out from definitive surgery for her breast cancer with no evidence of disease recurrence.  This is very  favorable.  She is recovering well from her radiation treatments.  She is looking forward to completing her trastuzumab treatment in mid July.  She is now ready to start antiestrogens. We discussed the difference between tamoxifen and anastrozole in detail. She understands that anastrozole and the aromatase inhibitors in general work by blocking estrogen production. Accordingly vaginal dryness, decrease in bone density, and of course hot flashes can result. The aromatase inhibitors can also negatively affect the cholesterol profile, although that is a minor effect. One out of 5 women on aromatase inhibitors we will feel "old and achy". This arthralgia/myalgia syndrome, which resembles fibromyalgia clinically, does resolve with stopping the medications. Accordingly this is not a reason to not try an aromatase inhibitor but it is a frequent reason to stop it (in other words 20% of women will not be able to tolerate these medications).  Tamoxifen on the other hand does not block estrogen production. It does not "take away a woman's estrogen". It blocks the estrogen receptor in breast cells. Like anastrozole, it can also cause hot flashes. As opposed to anastrozole, tamoxifen has many estrogen-like effects. It is technically an estrogen receptor modulator. This means that in some tissues tamoxifen works like estrogen-- for example it helps strengthen the bones. It tends to improve the cholesterol profile. It can cause thickening of the endometrial lining, and even endometrial polyps or rarely cancer of the uterus.(The risk of uterine cancer due to tamoxifen is one additional cancer per thousand women year). It can cause vaginal wetness or stickiness. It can cause blood clots through this estrogen-like effect--the risk of blood clots with tamoxifen is exactly the same as with birth control pills or hormone replacement.  Neither of these agents causes mood changes or weight gain, despite the popular belief that  they can have these side effects. We have data from studies comparing either of these drugs with placebo, and in those cases the control group had the same amount of weight gain and depression as the group that took the drug. ' She is going to be starting anastrozole and I discussed her bone density results with her.  We will repeat  that test on January 2024.  Meanwhile I have encouraged her to exercise as much as tolerated.  She is concerned about her renal function and we discussed water intake which will help with that.  She will return to see me on her last trastuzumab day, 01/19/2021.  I will set her up to for port removal after that  time.  Total encounter time 25 minutes.Sarajane Jews C. Latroy Gaymon, MD 10/27/20 10:09 AM Medical Oncology and Hematology [facility] [place], [place] [address] Tel. [phone]    Fax. [phone]   I, Wilburn Mylar, am acting as scribe for Dr. Virgie Dad. Alanson Hausmann.  I, Lurline Del MD, have reviewed the above documentation for accuracy and completeness, and I agree with the above.   *Total Encounter Time as defined by the Centers for Medicare and Medicaid Services includes, in addition to the face-to-face time of a patient visit (documented in the note above) non-face-to-face time: obtaining and reviewing outside history, ordering and reviewing medications, tests or procedures, care coordination (communications with other health care professionals or caregivers) and documentation in the medical record.

## 2020-10-27 ENCOUNTER — Other Ambulatory Visit: Payer: Self-pay

## 2020-10-27 ENCOUNTER — Inpatient Hospital Stay: Payer: 59

## 2020-10-27 ENCOUNTER — Other Ambulatory Visit: Payer: 59

## 2020-10-27 ENCOUNTER — Inpatient Hospital Stay: Payer: 59 | Admitting: Oncology

## 2020-10-27 ENCOUNTER — Encounter: Payer: Self-pay | Admitting: *Deleted

## 2020-10-27 VITALS — BP 138/79 | HR 80 | Temp 97.7°F | Resp 18 | Ht 62.0 in | Wt 216.9 lb

## 2020-10-27 DIAGNOSIS — E1165 Type 2 diabetes mellitus with hyperglycemia: Secondary | ICD-10-CM

## 2020-10-27 DIAGNOSIS — C50411 Malignant neoplasm of upper-outer quadrant of right female breast: Secondary | ICD-10-CM | POA: Diagnosis not present

## 2020-10-27 DIAGNOSIS — Z95828 Presence of other vascular implants and grafts: Secondary | ICD-10-CM

## 2020-10-27 DIAGNOSIS — Z170 Estrogen receptor positive status [ER+]: Secondary | ICD-10-CM | POA: Diagnosis not present

## 2020-10-27 DIAGNOSIS — E559 Vitamin D deficiency, unspecified: Secondary | ICD-10-CM

## 2020-10-27 LAB — COMPREHENSIVE METABOLIC PANEL
ALT: 11 U/L (ref 0–44)
AST: 11 U/L — ABNORMAL LOW (ref 15–41)
Albumin: 3.8 g/dL (ref 3.5–5.0)
Alkaline Phosphatase: 68 U/L (ref 38–126)
Anion gap: 11 (ref 5–15)
BUN: 15 mg/dL (ref 8–23)
CO2: 26 mmol/L (ref 22–32)
Calcium: 9.2 mg/dL (ref 8.9–10.3)
Chloride: 102 mmol/L (ref 98–111)
Creatinine, Ser: 0.98 mg/dL (ref 0.44–1.00)
GFR, Estimated: >60 mL/min (ref >60)
Glucose, Bld: 154 mg/dL — ABNORMAL HIGH (ref 70–99)
Potassium: 4 mmol/L (ref 3.5–5.1)
Sodium: 139 mmol/L (ref 135–145)
Total Bilirubin: 0.6 mg/dL (ref 0.3–1.2)
Total Protein: 7.2 g/dL (ref 6.5–8.1)

## 2020-10-27 LAB — CBC WITH DIFFERENTIAL/PLATELET
Abs Immature Granulocytes: 0.03 10*3/uL (ref 0.00–0.07)
Basophils Absolute: 0 10*3/uL (ref 0.0–0.1)
Basophils Relative: 0 %
Eosinophils Absolute: 0.3 10*3/uL (ref 0.0–0.5)
Eosinophils Relative: 4 %
HCT: 36 % (ref 36.0–46.0)
Hemoglobin: 11.8 g/dL — ABNORMAL LOW (ref 12.0–15.0)
Immature Granulocytes: 0 %
Lymphocytes Relative: 19 %
Lymphs Abs: 1.5 10*3/uL (ref 0.7–4.0)
MCH: 29.7 pg (ref 26.0–34.0)
MCHC: 32.8 g/dL (ref 30.0–36.0)
MCV: 90.7 fL (ref 80.0–100.0)
Monocytes Absolute: 0.7 10*3/uL (ref 0.1–1.0)
Monocytes Relative: 10 %
Neutro Abs: 5.1 10*3/uL (ref 1.7–7.7)
Neutrophils Relative %: 67 %
Platelets: 233 10*3/uL (ref 150–400)
RBC: 3.97 MIL/uL (ref 3.87–5.11)
RDW: 14.6 % (ref 11.5–15.5)
WBC: 7.7 10*3/uL (ref 4.0–10.5)
nRBC: 0 % (ref 0.0–0.2)

## 2020-10-27 MED ORDER — ACETAMINOPHEN 325 MG PO TABS
650.0000 mg | ORAL_TABLET | Freq: Once | ORAL | Status: AC
  Administered 2020-10-27: 650 mg via ORAL

## 2020-10-27 MED ORDER — ACETAMINOPHEN 325 MG PO TABS
ORAL_TABLET | ORAL | Status: AC
  Filled 2020-10-27: qty 2

## 2020-10-27 MED ORDER — ANASTROZOLE 1 MG PO TABS: 1.0000 mg | ORAL_TABLET | Freq: Every day | ORAL | 4 refills | Status: DC

## 2020-10-27 MED ORDER — SODIUM CHLORIDE 0.9 % IV SOLN
Freq: Once | INTRAVENOUS | Status: AC
  Filled 2020-10-27: qty 250

## 2020-10-27 MED ORDER — DIPHENHYDRAMINE HCL 12.5 MG/5ML PO ELIX
ORAL_SOLUTION | ORAL | Status: AC
  Filled 2020-10-27: qty 5

## 2020-10-27 MED ORDER — HEPARIN SOD (PORK) LOCK FLUSH 100 UNIT/ML IV SOLN
500.0000 [IU] | Freq: Once | INTRAVENOUS | Status: AC | PRN
  Administered 2020-10-27: 500 [IU]
  Filled 2020-10-27: qty 5

## 2020-10-27 MED ORDER — DIPHENHYDRAMINE HCL 12.5 MG/5ML PO ELIX: 25.0000 mg | ORAL_SOLUTION | Freq: Once | ORAL | Status: DC

## 2020-10-27 MED ORDER — SODIUM CHLORIDE 0.9% FLUSH
10.0000 mL | INTRAVENOUS | Status: DC | PRN
  Administered 2020-10-27: 10 mL
  Filled 2020-10-27: qty 10

## 2020-10-27 MED ORDER — TRASTUZUMAB-ANNS CHEMO 150 MG IV SOLR
600.0000 mg | Freq: Once | INTRAVENOUS | Status: AC
  Administered 2020-10-27: 600 mg via INTRAVENOUS
  Filled 2020-10-27: qty 28.57

## 2020-10-27 NOTE — Patient Instructions (Addendum)
[facility]  Discharge Instructions: °Thank you for choosing [facility] to provide your oncology and hematology care.  ° °If you have a lab appointment with the Cancer Center, please go directly to the Cancer Center and check in at the registration area. °  °Wear comfortable clothing and clothing appropriate for easy access to any Portacath or PICC line.  ° °We strive to give you quality time with your provider. You may need to reschedule your appointment if you arrive late (15 or more minutes).  Arriving late affects you and other patients whose appointments are after yours.  Also, if you miss three or more appointments without notifying the office, you may be dismissed from the clinic at the provider’s discretion.    °  °For prescription refill requests, have your pharmacy contact our office and allow 72 hours for refills to be completed.   ° °Today you received the following chemotherapy and/or immunotherapy agent: Trastuzumab (Kanjinti). °  °To help prevent nausea and vomiting after your treatment, we encourage you to take your nausea medication as directed. ° °BELOW ARE SYMPTOMS THAT SHOULD BE REPORTED IMMEDIATELY: °*FEVER GREATER THAN 100.4 F (38 °C) OR HIGHER °*CHILLS OR SWEATING °*NAUSEA AND VOMITING THAT IS NOT CONTROLLED WITH YOUR NAUSEA MEDICATION °*UNUSUAL SHORTNESS OF BREATH °*UNUSUAL BRUISING OR BLEEDING °*URINARY PROBLEMS (pain or burning when urinating, or frequent urination) °*BOWEL PROBLEMS (unusual diarrhea, constipation, pain near the anus) °TENDERNESS IN MOUTH AND THROAT WITH OR WITHOUT PRESENCE OF ULCERS (sore throat, sores in mouth, or a toothache) °UNUSUAL RASH, SWELLING OR PAIN  °UNUSUAL VAGINAL DISCHARGE OR ITCHING  ° °Items with * indicate a potential emergency and should be followed up as soon as possible or go to the Emergency Department if any problems should occur. ° °Please show the CHEMOTHERAPY ALERT CARD or IMMUNOTHERAPY ALERT CARD at  check-in to the Emergency Department and triage nurse. ° °Should you have questions after your visit or need to cancel or reschedule your appointment, please contact [facility]  Dept: [phone]  and follow the prompts.  Office hours are 8:00 a.m. to 4:30 p.m. Monday - Friday. Please note that voicemails left after 4:00 p.m. may not be returned until the following business day.  We are closed weekends and major holidays. You have access to a nurse at all times for urgent questions. Please call the main number to the clinic Dept: [phone] and follow the prompts. ° ° °For any non-urgent questions, you may also contact your provider using MyChart. We now offer e-Visits for anyone 18 and older to request care online for non-urgent symptoms. For details visit [email or web address]. °  °Also download the MyChart app! Go to the app store, search "MyChart", open the app, select , and log in with your MyChart username and password. ° °Due to Covid, a mask is required upon entering the hospital/clinic. If you do not have a mask, one will be given to you upon arrival. For doctor visits, patients may have 1 support person aged 18 or older with them. For treatment visits, patients cannot have anyone with them due to current Covid guidelines and our immunocompromised population.  ° °

## 2020-11-06 ENCOUNTER — Telehealth: Payer: Self-pay | Admitting: Oncology

## 2020-11-06 NOTE — Telephone Encounter (Signed)
Rescheduled per list from [facility]. Called and spoke with pt, confirmed new appt time on 6/13

## 2020-11-07 ENCOUNTER — Telehealth: Payer: Self-pay | Admitting: *Deleted

## 2020-11-07 NOTE — Telephone Encounter (Signed)
This RN spoke with pt per last IV therapy planned for January 20 2021- she would like to get her port removed either late August or early September.  This note will be forwarded to Dr Brantley Stage for scheduling of the above.

## 2020-11-07 NOTE — Telephone Encounter (Signed)
This RN spoke with pt per her call stating she is being asked by her employer about when is her return to work expected.  Per review of therapy and need for port removal date of 03/06/221 will be given.  Dajanique will inform her employer and find out if she needs a letter from this office stating the above or does she need to submit a new FMLA form.  She will call and let this RN know next week.  Of note she states her last herceptin is scheduled for August 16th - and she would like her port removed late August- early September.  This RN will notify Dr Cornett's office per above date for port removal.

## 2020-11-17 ENCOUNTER — Ambulatory Visit: Payer: 59

## 2020-11-17 ENCOUNTER — Other Ambulatory Visit: Payer: Self-pay

## 2020-11-17 ENCOUNTER — Other Ambulatory Visit: Payer: 59

## 2020-11-17 ENCOUNTER — Inpatient Hospital Stay: Payer: 59 | Attending: Oncology

## 2020-11-17 ENCOUNTER — Inpatient Hospital Stay: Payer: 59

## 2020-11-17 VITALS — BP 142/75 | HR 92 | Temp 98.6°F | Resp 18 | Ht 62.0 in | Wt 220.2 lb

## 2020-11-17 DIAGNOSIS — Z170 Estrogen receptor positive status [ER+]: Secondary | ICD-10-CM | POA: Diagnosis not present

## 2020-11-17 DIAGNOSIS — C50411 Malignant neoplasm of upper-outer quadrant of right female breast: Secondary | ICD-10-CM | POA: Diagnosis not present

## 2020-11-17 DIAGNOSIS — E559 Vitamin D deficiency, unspecified: Secondary | ICD-10-CM

## 2020-11-17 DIAGNOSIS — Z5112 Encounter for antineoplastic immunotherapy: Secondary | ICD-10-CM | POA: Diagnosis not present

## 2020-11-17 DIAGNOSIS — Z95828 Presence of other vascular implants and grafts: Secondary | ICD-10-CM

## 2020-11-17 DIAGNOSIS — E1165 Type 2 diabetes mellitus with hyperglycemia: Secondary | ICD-10-CM

## 2020-11-17 LAB — CBC WITH DIFFERENTIAL/PLATELET
Abs Immature Granulocytes: 0.03 10*3/uL (ref 0.00–0.07)
Basophils Absolute: 0 10*3/uL (ref 0.0–0.1)
Basophils Relative: 0 %
Eosinophils Absolute: 0.3 10*3/uL (ref 0.0–0.5)
Eosinophils Relative: 4 %
HCT: 33.7 % — ABNORMAL LOW (ref 36.0–46.0)
Hemoglobin: 11.6 g/dL — ABNORMAL LOW (ref 12.0–15.0)
Immature Granulocytes: 0 %
Lymphocytes Relative: 22 %
Lymphs Abs: 1.6 10*3/uL (ref 0.7–4.0)
MCH: 30.3 pg (ref 26.0–34.0)
MCHC: 34.4 g/dL (ref 30.0–36.0)
MCV: 88 fL (ref 80.0–100.0)
Monocytes Absolute: 0.8 10*3/uL (ref 0.1–1.0)
Monocytes Relative: 11 %
Neutro Abs: 4.8 10*3/uL (ref 1.7–7.7)
Neutrophils Relative %: 63 %
Platelets: 238 10*3/uL (ref 150–400)
RBC: 3.83 MIL/uL — ABNORMAL LOW (ref 3.87–5.11)
RDW: 14.5 % (ref 11.5–15.5)
WBC: 7.5 10*3/uL (ref 4.0–10.5)
nRBC: 0 % (ref 0.0–0.2)

## 2020-11-17 LAB — COMPREHENSIVE METABOLIC PANEL
ALT: 13 U/L (ref 0–44)
AST: 17 U/L (ref 15–41)
Albumin: 3.6 g/dL (ref 3.5–5.0)
Alkaline Phosphatase: 72 U/L (ref 38–126)
Anion gap: 9 (ref 5–15)
BUN: 12 mg/dL (ref 8–23)
CO2: 27 mmol/L (ref 22–32)
Calcium: 8.9 mg/dL (ref 8.9–10.3)
Chloride: 103 mmol/L (ref 98–111)
Creatinine, Ser: 1.02 mg/dL — ABNORMAL HIGH (ref 0.44–1.00)
GFR, Estimated: >60 mL/min (ref >60)
Glucose, Bld: 143 mg/dL — ABNORMAL HIGH (ref 70–99)
Potassium: 3.8 mmol/L (ref 3.5–5.1)
Sodium: 139 mmol/L (ref 135–145)
Total Bilirubin: 0.7 mg/dL (ref 0.3–1.2)
Total Protein: 7.1 g/dL (ref 6.5–8.1)

## 2020-11-17 MED ORDER — TRASTUZUMAB-ANNS CHEMO 150 MG IV SOLR
600.0000 mg | Freq: Once | INTRAVENOUS | Status: AC
  Administered 2020-11-17: 600 mg via INTRAVENOUS
  Filled 2020-11-17: qty 28.57

## 2020-11-17 MED ORDER — SODIUM CHLORIDE 0.9% FLUSH
10.0000 mL | INTRAVENOUS | Status: DC | PRN
  Administered 2020-11-17: 10 mL
  Filled 2020-11-17: qty 10

## 2020-11-17 MED ORDER — ACETAMINOPHEN 325 MG PO TABS
650.0000 mg | ORAL_TABLET | Freq: Once | ORAL | Status: AC
  Administered 2020-11-17: 650 mg via ORAL

## 2020-11-17 MED ORDER — HEPARIN SOD (PORK) LOCK FLUSH 100 UNIT/ML IV SOLN
500.0000 [IU] | Freq: Once | INTRAVENOUS | Status: AC | PRN
  Administered 2020-11-17: 500 [IU]
  Filled 2020-11-17: qty 5

## 2020-11-17 MED ORDER — ACETAMINOPHEN 325 MG PO TABS
ORAL_TABLET | ORAL | Status: AC
  Filled 2020-11-17: qty 2

## 2020-11-17 MED ORDER — SODIUM CHLORIDE 0.9 % IV SOLN
Freq: Once | INTRAVENOUS | Status: AC
Start: 2020-11-17 — End: 2020-11-17
  Filled 2020-11-17: qty 250

## 2020-11-17 MED ORDER — DIPHENHYDRAMINE HCL 12.5 MG/5ML PO ELIX: 25.0000 mg | ORAL_SOLUTION | Freq: Once | ORAL | Status: DC

## 2020-11-17 NOTE — Patient Instructions (Signed)
[facility]]   Discharge Instructions: Thank you for choosing [facility] to provide your oncology and hematology care.   If you have a lab appointment with the Cancer Center, please go directly to the Cancer Center and check in at the registration area.   Wear comfortable clothing and clothing appropriate for easy access to any Portacath or PICC line.   We strive to give you quality time with your provider. You may need to reschedule your appointment if you arrive late (15 or more minutes).  Arriving late affects you and other patients whose appointments are after yours.  Also, if you miss three or more appointments without notifying the office, you may be dismissed from the clinic at the provider's discretion.      For prescription refill requests, have your pharmacy contact our office and allow 72 hours for refills to be completed.    Today you received the following chemotherapy and/or immunotherapy agents: Trastuzumab (Herceptin)       To help prevent nausea and vomiting after your treatment, we encourage you to take your nausea medication as directed.  BELOW ARE SYMPTOMS THAT SHOULD BE REPORTED IMMEDIATELY: *FEVER GREATER THAN 100.4 F (38 C) OR HIGHER *CHILLS OR SWEATING *NAUSEA AND VOMITING THAT IS NOT CONTROLLED WITH YOUR NAUSEA MEDICATION *UNUSUAL SHORTNESS OF BREATH *UNUSUAL BRUISING OR BLEEDING *URINARY PROBLEMS (pain or burning when urinating, or frequent urination) *BOWEL PROBLEMS (unusual diarrhea, constipation, pain near the anus) TENDERNESS IN MOUTH AND THROAT WITH OR WITHOUT PRESENCE OF ULCERS (sore throat, sores in mouth, or a toothache) UNUSUAL RASH, SWELLING OR PAIN  UNUSUAL VAGINAL DISCHARGE OR ITCHING   Items with * indicate a potential emergency and should be followed up as soon as possible or go to the Emergency Department if any problems should occur.  Please show the CHEMOTHERAPY ALERT CARD or IMMUNOTHERAPY ALERT  CARD at check-in to the Emergency Department and triage nurse.  Should you have questions after your visit or need to cancel or reschedule your appointment, please contact [facility]  Dept: [phone]  and follow the prompts.  Office hours are 8:00 a.m. to 4:30 p.m. Monday - Friday. Please note that voicemails left after 4:00 p.m. may not be returned until the following business day.  We are closed weekends and major holidays. You have access to a nurse at all times for urgent questions. Please call the main number to the clinic Dept: [phone] and follow the prompts.   For any non-urgent questions, you may also contact your provider using MyChart. We now offer e-Visits for anyone 18 and older to request care online for non-urgent symptoms. For details visit [email or web address].   Also download the MyChart app! Go to the app store, search "MyChart", open the app, select [facility], and log in with your MyChart username and password.  Due to Covid, a mask is required upon entering the hospital/clinic. If you do not have a mask, one will be given to you upon arrival. For doctor visits, patients may have 1 support person aged 18 or older with them. For treatment visits, patients cannot have anyone with them due to current Covid guidelines and our immunocompromised population.  

## 2020-12-09 ENCOUNTER — Inpatient Hospital Stay: Payer: 59

## 2020-12-09 ENCOUNTER — Other Ambulatory Visit: Payer: Self-pay

## 2020-12-09 ENCOUNTER — Inpatient Hospital Stay: Payer: 59 | Attending: Oncology

## 2020-12-09 VITALS — BP 148/70 | HR 72 | Temp 98.6°F | Resp 18 | Wt 219.0 lb

## 2020-12-09 DIAGNOSIS — Z95828 Presence of other vascular implants and grafts: Secondary | ICD-10-CM

## 2020-12-09 DIAGNOSIS — E1165 Type 2 diabetes mellitus with hyperglycemia: Secondary | ICD-10-CM

## 2020-12-09 DIAGNOSIS — Z170 Estrogen receptor positive status [ER+]: Secondary | ICD-10-CM

## 2020-12-09 DIAGNOSIS — C50411 Malignant neoplasm of upper-outer quadrant of right female breast: Secondary | ICD-10-CM

## 2020-12-09 DIAGNOSIS — Z5112 Encounter for antineoplastic immunotherapy: Secondary | ICD-10-CM | POA: Diagnosis present

## 2020-12-09 DIAGNOSIS — E559 Vitamin D deficiency, unspecified: Secondary | ICD-10-CM

## 2020-12-09 LAB — CBC WITH DIFFERENTIAL/PLATELET
Abs Immature Granulocytes: 0.02 10*3/uL (ref 0.00–0.07)
Basophils Absolute: 0 10*3/uL (ref 0.0–0.1)
Basophils Relative: 0 %
Eosinophils Absolute: 0.3 10*3/uL (ref 0.0–0.5)
Eosinophils Relative: 3 %
HCT: 34.9 % — ABNORMAL LOW (ref 36.0–46.0)
Hemoglobin: 11.7 g/dL — ABNORMAL LOW (ref 12.0–15.0)
Immature Granulocytes: 0 %
Lymphocytes Relative: 16 %
Lymphs Abs: 1.4 10*3/uL (ref 0.7–4.0)
MCH: 30.2 pg (ref 26.0–34.0)
MCHC: 33.5 g/dL (ref 30.0–36.0)
MCV: 89.9 fL (ref 80.0–100.0)
Monocytes Absolute: 0.8 10*3/uL (ref 0.1–1.0)
Monocytes Relative: 9 %
Neutro Abs: 6.2 10*3/uL (ref 1.7–7.7)
Neutrophils Relative %: 72 %
Platelets: 231 10*3/uL (ref 150–400)
RBC: 3.88 MIL/uL (ref 3.87–5.11)
RDW: 14.2 % (ref 11.5–15.5)
WBC: 8.6 10*3/uL (ref 4.0–10.5)
nRBC: 0 % (ref 0.0–0.2)

## 2020-12-09 LAB — COMPREHENSIVE METABOLIC PANEL
ALT: 12 U/L (ref 0–44)
AST: 11 U/L — ABNORMAL LOW (ref 15–41)
Albumin: 3.5 g/dL (ref 3.5–5.0)
Alkaline Phosphatase: 72 U/L (ref 38–126)
Anion gap: 9 (ref 5–15)
BUN: 10 mg/dL (ref 8–23)
CO2: 27 mmol/L (ref 22–32)
Calcium: 9 mg/dL (ref 8.9–10.3)
Chloride: 103 mmol/L (ref 98–111)
Creatinine, Ser: 0.95 mg/dL (ref 0.44–1.00)
GFR, Estimated: >60 mL/min (ref >60)
Glucose, Bld: 153 mg/dL — ABNORMAL HIGH (ref 70–99)
Potassium: 4.1 mmol/L (ref 3.5–5.1)
Sodium: 139 mmol/L (ref 135–145)
Total Bilirubin: 0.5 mg/dL (ref 0.3–1.2)
Total Protein: 7 g/dL (ref 6.5–8.1)

## 2020-12-09 MED ORDER — SODIUM CHLORIDE 0.9% FLUSH
10.0000 mL | INTRAVENOUS | Status: DC | PRN
  Administered 2020-12-09: 10 mL
  Filled 2020-12-09: qty 10

## 2020-12-09 MED ORDER — DIPHENHYDRAMINE HCL 12.5 MG/5ML PO ELIX: 25.0000 mg | ORAL_SOLUTION | Freq: Once | ORAL | Status: DC

## 2020-12-09 MED ORDER — TRASTUZUMAB-ANNS CHEMO 150 MG IV SOLR
600.0000 mg | Freq: Once | INTRAVENOUS | Status: AC
  Administered 2020-12-09: 600 mg via INTRAVENOUS
  Filled 2020-12-09: qty 28.57

## 2020-12-09 MED ORDER — SODIUM CHLORIDE 0.9 % IV SOLN
Freq: Once | INTRAVENOUS | Status: AC
  Filled 2020-12-09: qty 250

## 2020-12-09 MED ORDER — ACETAMINOPHEN 325 MG PO TABS: 650.0000 mg | ORAL_TABLET | Freq: Once | ORAL | Status: DC

## 2020-12-09 MED ORDER — HEPARIN SOD (PORK) LOCK FLUSH 100 UNIT/ML IV SOLN
500.0000 [IU] | Freq: Once | INTRAVENOUS | Status: AC | PRN
  Administered 2020-12-09: 500 [IU]
  Filled 2020-12-09: qty 5

## 2020-12-09 NOTE — Patient Instructions (Signed)
[facility]]   Discharge Instructions: Thank you for choosing [facility] to provide your oncology and hematology care.   If you have a lab appointment with the Cancer Center, please go directly to the Cancer Center and check in at the registration area.   Wear comfortable clothing and clothing appropriate for easy access to any Portacath or PICC line.   We strive to give you quality time with your provider. You may need to reschedule your appointment if you arrive late (15 or more minutes).  Arriving late affects you and other patients whose appointments are after yours.  Also, if you miss three or more appointments without notifying the office, you may be dismissed from the clinic at the provider's discretion.      For prescription refill requests, have your pharmacy contact our office and allow 72 hours for refills to be completed.    Today you received the following chemotherapy and/or immunotherapy agents: Trastuzumab (Herceptin)       To help prevent nausea and vomiting after your treatment, we encourage you to take your nausea medication as directed.  BELOW ARE SYMPTOMS THAT SHOULD BE REPORTED IMMEDIATELY: *FEVER GREATER THAN 100.4 F (38 C) OR HIGHER *CHILLS OR SWEATING *NAUSEA AND VOMITING THAT IS NOT CONTROLLED WITH YOUR NAUSEA MEDICATION *UNUSUAL SHORTNESS OF BREATH *UNUSUAL BRUISING OR BLEEDING *URINARY PROBLEMS (pain or burning when urinating, or frequent urination) *BOWEL PROBLEMS (unusual diarrhea, constipation, pain near the anus) TENDERNESS IN MOUTH AND THROAT WITH OR WITHOUT PRESENCE OF ULCERS (sore throat, sores in mouth, or a toothache) UNUSUAL RASH, SWELLING OR PAIN  UNUSUAL VAGINAL DISCHARGE OR ITCHING   Items with * indicate a potential emergency and should be followed up as soon as possible or go to the Emergency Department if any problems should occur.  Please show the CHEMOTHERAPY ALERT CARD or IMMUNOTHERAPY ALERT  CARD at check-in to the Emergency Department and triage nurse.  Should you have questions after your visit or need to cancel or reschedule your appointment, please contact [facility]  Dept: [phone]  and follow the prompts.  Office hours are 8:00 a.m. to 4:30 p.m. Monday - Friday. Please note that voicemails left after 4:00 p.m. may not be returned until the following business day.  We are closed weekends and major holidays. You have access to a nurse at all times for urgent questions. Please call the main number to the clinic Dept: [phone] and follow the prompts.   For any non-urgent questions, you may also contact your provider using MyChart. We now offer e-Visits for anyone 18 and older to request care online for non-urgent symptoms. For details visit [email or web address].   Also download the MyChart app! Go to the app store, search "MyChart", open the app, select , and log in with your MyChart username and password.  Due to Covid, a mask is required upon entering the hospital/clinic. If you do not have a mask, one will be given to you upon arrival. For doctor visits, patients may have 1 support person aged 18 or older with them. For treatment visits, patients cannot have anyone with them due to current Covid guidelines and our immunocompromised population.  

## 2020-12-30 ENCOUNTER — Inpatient Hospital Stay: Payer: 59

## 2020-12-30 ENCOUNTER — Other Ambulatory Visit: Payer: Self-pay

## 2020-12-30 VITALS — BP 128/70 | HR 77 | Temp 98.5°F | Resp 18 | Wt 221.1 lb

## 2020-12-30 DIAGNOSIS — E559 Vitamin D deficiency, unspecified: Secondary | ICD-10-CM

## 2020-12-30 DIAGNOSIS — Z170 Estrogen receptor positive status [ER+]: Secondary | ICD-10-CM

## 2020-12-30 DIAGNOSIS — Z95828 Presence of other vascular implants and grafts: Secondary | ICD-10-CM

## 2020-12-30 DIAGNOSIS — Z5112 Encounter for antineoplastic immunotherapy: Secondary | ICD-10-CM | POA: Diagnosis not present

## 2020-12-30 DIAGNOSIS — C50411 Malignant neoplasm of upper-outer quadrant of right female breast: Secondary | ICD-10-CM

## 2020-12-30 DIAGNOSIS — E1165 Type 2 diabetes mellitus with hyperglycemia: Secondary | ICD-10-CM

## 2020-12-30 LAB — COMPREHENSIVE METABOLIC PANEL
ALT: 13 U/L (ref 0–44)
AST: 12 U/L — ABNORMAL LOW (ref 15–41)
Albumin: 3.5 g/dL (ref 3.5–5.0)
Alkaline Phosphatase: 66 U/L (ref 38–126)
Anion gap: 11 (ref 5–15)
BUN: 18 mg/dL (ref 8–23)
CO2: 26 mmol/L (ref 22–32)
Calcium: 9 mg/dL (ref 8.9–10.3)
Chloride: 101 mmol/L (ref 98–111)
Creatinine, Ser: 1.02 mg/dL — ABNORMAL HIGH (ref 0.44–1.00)
GFR, Estimated: >60 mL/min (ref >60)
Glucose, Bld: 187 mg/dL — ABNORMAL HIGH (ref 70–99)
Potassium: 3.9 mmol/L (ref 3.5–5.1)
Sodium: 138 mmol/L (ref 135–145)
Total Bilirubin: 0.6 mg/dL (ref 0.3–1.2)
Total Protein: 6.8 g/dL (ref 6.5–8.1)

## 2020-12-30 LAB — CBC WITH DIFFERENTIAL/PLATELET
Abs Immature Granulocytes: 0.04 10*3/uL (ref 0.00–0.07)
Basophils Absolute: 0 10*3/uL (ref 0.0–0.1)
Basophils Relative: 0 %
Eosinophils Absolute: 0.3 10*3/uL (ref 0.0–0.5)
Eosinophils Relative: 4 %
HCT: 32.7 % — ABNORMAL LOW (ref 36.0–46.0)
Hemoglobin: 11.1 g/dL — ABNORMAL LOW (ref 12.0–15.0)
Immature Granulocytes: 1 %
Lymphocytes Relative: 21 %
Lymphs Abs: 1.6 10*3/uL (ref 0.7–4.0)
MCH: 30 pg (ref 26.0–34.0)
MCHC: 33.9 g/dL (ref 30.0–36.0)
MCV: 88.4 fL (ref 80.0–100.0)
Monocytes Absolute: 0.7 10*3/uL (ref 0.1–1.0)
Monocytes Relative: 9 %
Neutro Abs: 5.2 10*3/uL (ref 1.7–7.7)
Neutrophils Relative %: 65 %
Platelets: 201 10*3/uL (ref 150–400)
RBC: 3.7 MIL/uL — ABNORMAL LOW (ref 3.87–5.11)
RDW: 14.1 % (ref 11.5–15.5)
WBC: 7.8 10*3/uL (ref 4.0–10.5)
nRBC: 0 % (ref 0.0–0.2)

## 2020-12-30 MED ORDER — HEPARIN SOD (PORK) LOCK FLUSH 100 UNIT/ML IV SOLN
500.0000 [IU] | Freq: Once | INTRAVENOUS | Status: AC | PRN
  Administered 2020-12-30: 500 [IU]
  Filled 2020-12-30: qty 5

## 2020-12-30 MED ORDER — SODIUM CHLORIDE 0.9% FLUSH
10.0000 mL | INTRAVENOUS | Status: DC | PRN
  Administered 2020-12-30: 10 mL
  Filled 2020-12-30: qty 10

## 2020-12-30 MED ORDER — DIPHENHYDRAMINE HCL 12.5 MG/5ML PO ELIX: 25.0000 mg | ORAL_SOLUTION | Freq: Once | ORAL | Status: DC

## 2020-12-30 MED ORDER — SODIUM CHLORIDE 0.9 % IV SOLN
Freq: Once | INTRAVENOUS | Status: AC
  Filled 2020-12-30: qty 250

## 2020-12-30 MED ORDER — TRASTUZUMAB-ANNS CHEMO 150 MG IV SOLR
600.0000 mg | Freq: Once | INTRAVENOUS | Status: AC
  Administered 2020-12-30: 600 mg via INTRAVENOUS
  Filled 2020-12-30: qty 28.57

## 2020-12-30 MED ORDER — ACETAMINOPHEN 325 MG PO TABS: 650.0000 mg | ORAL_TABLET | Freq: Once | ORAL | Status: DC

## 2020-12-30 NOTE — Patient Instructions (Signed)
[facility]  Discharge Instructions: °Thank you for choosing [facility] to provide your oncology and hematology care.  ° °If you have a lab appointment with the Cancer Center, please go directly to the Cancer Center and check in at the registration area. °  °Wear comfortable clothing and clothing appropriate for easy access to any Portacath or PICC line.  ° °We strive to give you quality time with your provider. You may need to reschedule your appointment if you arrive late (15 or more minutes).  Arriving late affects you and other patients whose appointments are after yours.  Also, if you miss three or more appointments without notifying the office, you may be dismissed from the clinic at the provider’s discretion.    °  °For prescription refill requests, have your pharmacy contact our office and allow 72 hours for refills to be completed.   ° °Today you received the following chemotherapy and/or immunotherapy agents: Kanjinti °  °To help prevent nausea and vomiting after your treatment, we encourage you to take your nausea medication as directed. ° °BELOW ARE SYMPTOMS THAT SHOULD BE REPORTED IMMEDIATELY: °*FEVER GREATER THAN 100.4 F (38 °C) OR HIGHER °*CHILLS OR SWEATING °*NAUSEA AND VOMITING THAT IS NOT CONTROLLED WITH YOUR NAUSEA MEDICATION °*UNUSUAL SHORTNESS OF BREATH °*UNUSUAL BRUISING OR BLEEDING °*URINARY PROBLEMS (pain or burning when urinating, or frequent urination) °*BOWEL PROBLEMS (unusual diarrhea, constipation, pain near the anus) °TENDERNESS IN MOUTH AND THROAT WITH OR WITHOUT PRESENCE OF ULCERS (sore throat, sores in mouth, or a toothache) °UNUSUAL RASH, SWELLING OR PAIN  °UNUSUAL VAGINAL DISCHARGE OR ITCHING  ° °Items with * indicate a potential emergency and should be followed up as soon as possible or go to the Emergency Department if any problems should occur. ° °Please show the CHEMOTHERAPY ALERT CARD or IMMUNOTHERAPY ALERT CARD at check-in to the  Emergency Department and triage nurse. ° °Should you have questions after your visit or need to cancel or reschedule your appointment, please contact  [facility]ENTER [facility]  Dept: [phone]  and follow the prompts.  Office hours are 8:00 a.m. to 4:30 p.m. Monday - Friday. Please note that voicemails left after 4:00 p.m. may not be returned until the following business day.  We are closed weekends and major holidays. You have access to a nurse at all times for urgent questions. Please call the main number to the clinic Dept: [phone] and follow the prompts. ° ° °For any non-urgent questions, you may also contact your provider using MyChart. We now offer e-Visits for anyone 18 and older to request care online for non-urgent symptoms. For details visit [email or web address]. °  °Also download the MyChart app! Go to the app store, search "MyChart", open the app, select , and log in with your MyChart username and password. ° °Due to Covid, a mask is required upon entering the hospital/clinic. If you do not have a mask, one will be given to you upon arrival. For doctor visits, patients may have 1 support person aged 18 or older with them. For treatment visits, patients cannot have anyone with them due to current Covid guidelines and our immunocompromised population.  ° °

## 2021-01-06 ENCOUNTER — Telehealth: Payer: Self-pay | Admitting: *Deleted

## 2021-01-06 NOTE — Telephone Encounter (Signed)
Connected with KORIANA WOLNY [phone]) to notify her to pick up completed [employer]ML[employer] form.  "Mail it to me.  I do not have to come in for another two weeks."

## 2021-01-15 ENCOUNTER — Telehealth: Payer: Self-pay | Admitting: *Deleted

## 2021-01-15 NOTE — Telephone Encounter (Signed)
Connected with TNIYAH SHIPLETT [phone]) regarding [employer]ML[employer] claim: [phone].   "This is a renewal despite not qualifying for FMLA per [employer].  Went ahead and checked boxes I thought were correct using form MD signed 01/06/2021 and information from [place].  FMLA expired 12/27/2020.  Returned to work, twenty-four hours weekly at [employer] but day ends at 1:00 pm.  Need coverage for 01/20/2021 appointment and then I am done having started Arimidex in May for five years."  Advised not to complete provider section of forms.  Corrections made using above information.  Immunotherapy follow up appointments average three to four times annually are extended farther out with survivorship.  Request scheduling future [facility], imaging or symptom management appointments begin no earlier than 1:30 pm.            Currently denies questions or needs.

## 2021-01-16 ENCOUNTER — Other Ambulatory Visit: Payer: Self-pay | Admitting: Adult Health

## 2021-01-16 DIAGNOSIS — I10 Essential (primary) hypertension: Secondary | ICD-10-CM

## 2021-01-20 ENCOUNTER — Inpatient Hospital Stay: Payer: 59 | Admitting: Oncology

## 2021-01-20 ENCOUNTER — Encounter: Payer: Self-pay | Admitting: *Deleted

## 2021-01-20 ENCOUNTER — Inpatient Hospital Stay: Payer: 59

## 2021-01-20 ENCOUNTER — Other Ambulatory Visit: Payer: Self-pay

## 2021-01-20 ENCOUNTER — Inpatient Hospital Stay: Payer: 59 | Attending: Oncology

## 2021-01-20 ENCOUNTER — Other Ambulatory Visit: Payer: Self-pay | Admitting: *Deleted

## 2021-01-20 VITALS — BP 128/86 | HR 91 | Temp 97.7°F | Resp 18 | Ht 62.0 in | Wt 216.5 lb

## 2021-01-20 DIAGNOSIS — Z803 Family history of malignant neoplasm of breast: Secondary | ICD-10-CM | POA: Insufficient documentation

## 2021-01-20 DIAGNOSIS — C50411 Malignant neoplasm of upper-outer quadrant of right female breast: Secondary | ICD-10-CM | POA: Diagnosis not present

## 2021-01-20 DIAGNOSIS — Z79811 Long term (current) use of aromatase inhibitors: Secondary | ICD-10-CM | POA: Insufficient documentation

## 2021-01-20 DIAGNOSIS — K219 Gastro-esophageal reflux disease without esophagitis: Secondary | ICD-10-CM | POA: Insufficient documentation

## 2021-01-20 DIAGNOSIS — E119 Type 2 diabetes mellitus without complications: Secondary | ICD-10-CM | POA: Insufficient documentation

## 2021-01-20 DIAGNOSIS — Z170 Estrogen receptor positive status [ER+]: Secondary | ICD-10-CM | POA: Insufficient documentation

## 2021-01-20 DIAGNOSIS — Z7982 Long term (current) use of aspirin: Secondary | ICD-10-CM | POA: Diagnosis not present

## 2021-01-20 DIAGNOSIS — Z79899 Other long term (current) drug therapy: Secondary | ICD-10-CM | POA: Diagnosis not present

## 2021-01-20 DIAGNOSIS — N189 Chronic kidney disease, unspecified: Secondary | ICD-10-CM | POA: Insufficient documentation

## 2021-01-20 DIAGNOSIS — Z95828 Presence of other vascular implants and grafts: Secondary | ICD-10-CM

## 2021-01-20 DIAGNOSIS — Z801 Family history of malignant neoplasm of trachea, bronchus and lung: Secondary | ICD-10-CM | POA: Diagnosis not present

## 2021-01-20 DIAGNOSIS — I129 Hypertensive chronic kidney disease with stage 1 through stage 4 chronic kidney disease, or unspecified chronic kidney disease: Secondary | ICD-10-CM | POA: Insufficient documentation

## 2021-01-20 DIAGNOSIS — G4730 Sleep apnea, unspecified: Secondary | ICD-10-CM | POA: Diagnosis not present

## 2021-01-20 DIAGNOSIS — E785 Hyperlipidemia, unspecified: Secondary | ICD-10-CM | POA: Diagnosis not present

## 2021-01-20 DIAGNOSIS — Z9013 Acquired absence of bilateral breasts and nipples: Secondary | ICD-10-CM | POA: Insufficient documentation

## 2021-01-20 DIAGNOSIS — K760 Fatty (change of) liver, not elsewhere classified: Secondary | ICD-10-CM | POA: Diagnosis not present

## 2021-01-20 DIAGNOSIS — Z7984 Long term (current) use of oral hypoglycemic drugs: Secondary | ICD-10-CM | POA: Diagnosis not present

## 2021-01-20 DIAGNOSIS — Z5112 Encounter for antineoplastic immunotherapy: Secondary | ICD-10-CM | POA: Insufficient documentation

## 2021-01-20 LAB — CMP (CANCER CENTER ONLY)
ALT: 16 U/L (ref 0–44)
AST: 10 U/L — ABNORMAL LOW (ref 15–41)
Albumin: 3.7 g/dL (ref 3.5–5.0)
Alkaline Phosphatase: 67 U/L (ref 38–126)
Anion gap: 11 (ref 5–15)
BUN: 10 mg/dL (ref 8–23)
CO2: 25 mmol/L (ref 22–32)
Calcium: 9.3 mg/dL (ref 8.9–10.3)
Chloride: 101 mmol/L (ref 98–111)
Creatinine: 1.12 mg/dL — ABNORMAL HIGH (ref 0.44–1.00)
GFR, Estimated: 55 mL/min — ABNORMAL LOW (ref >60)
Glucose, Bld: 278 mg/dL — ABNORMAL HIGH (ref 70–99)
Potassium: 4.2 mmol/L (ref 3.5–5.1)
Sodium: 137 mmol/L (ref 135–145)
Total Bilirubin: 0.5 mg/dL (ref 0.3–1.2)
Total Protein: 7 g/dL (ref 6.5–8.1)

## 2021-01-20 LAB — CBC WITH DIFFERENTIAL (CANCER CENTER ONLY)
Abs Immature Granulocytes: 0.03 10*3/uL (ref 0.00–0.07)
Basophils Absolute: 0 10*3/uL (ref 0.0–0.1)
Basophils Relative: 0 %
Eosinophils Absolute: 0.2 10*3/uL (ref 0.0–0.5)
Eosinophils Relative: 3 %
HCT: 34.2 % — ABNORMAL LOW (ref 36.0–46.0)
Hemoglobin: 11.7 g/dL — ABNORMAL LOW (ref 12.0–15.0)
Immature Granulocytes: 0 %
Lymphocytes Relative: 18 %
Lymphs Abs: 1.4 10*3/uL (ref 0.7–4.0)
MCH: 30.3 pg (ref 26.0–34.0)
MCHC: 34.2 g/dL (ref 30.0–36.0)
MCV: 88.6 fL (ref 80.0–100.0)
Monocytes Absolute: 0.7 10*3/uL (ref 0.1–1.0)
Monocytes Relative: 9 %
Neutro Abs: 5.4 10*3/uL (ref 1.7–7.7)
Neutrophils Relative %: 70 %
Platelet Count: 215 10*3/uL (ref 150–400)
RBC: 3.86 MIL/uL — ABNORMAL LOW (ref 3.87–5.11)
RDW: 14.4 % (ref 11.5–15.5)
WBC Count: 7.8 10*3/uL (ref 4.0–10.5)
nRBC: 0 % (ref 0.0–0.2)

## 2021-01-20 MED ORDER — SODIUM CHLORIDE 0.9% FLUSH
10.0000 mL | INTRAVENOUS | Status: DC | PRN
  Administered 2021-01-20: 10 mL

## 2021-01-20 MED ORDER — TRASTUZUMAB-ANNS CHEMO 150 MG IV SOLR
600.0000 mg | Freq: Once | INTRAVENOUS | Status: AC
  Administered 2021-01-20: 600 mg via INTRAVENOUS
  Filled 2021-01-20: qty 28.57

## 2021-01-20 MED ORDER — HEPARIN SOD (PORK) LOCK FLUSH 100 UNIT/ML IV SOLN
500.0000 [IU] | Freq: Once | INTRAVENOUS | Status: AC | PRN
  Administered 2021-01-20: 500 [IU]

## 2021-01-20 MED ORDER — SODIUM CHLORIDE 0.9 % IV SOLN: Freq: Once | INTRAVENOUS | Status: AC

## 2021-01-20 MED ORDER — ACETAMINOPHEN 325 MG PO TABS: 650.0000 mg | ORAL_TABLET | Freq: Once | ORAL | Status: DC

## 2021-01-20 MED ORDER — DIPHENHYDRAMINE HCL 12.5 MG/5ML PO ELIX: 25.0000 mg | ORAL_SOLUTION | Freq: Once | ORAL | Status: DC

## 2021-01-20 NOTE — Progress Notes (Signed)
[place]  [phone] [phone]) [phone]     ID: Tracy Garrett DOB: 1955/06/22  MR#: [id number]  VQX#:[id number]  Patient Care Team: Unk Pinto, MD as PCP - General (Internal Medicine) [name], RN as Oncology Nurse Navigator [name], RN as Oncology Nurse Navigator Erroll Luna, MD as Consulting Physician (General Surgery) Aura Bibby, Virgie Dad, MD as Consulting Physician (Oncology) Eppie Gibson, MD as Attending Physician (Radiation Oncology) Mcarthur Rossetti, MD as Consulting Physician (Orthopedic Surgery) Regal, Tamala Fothergill, DPM as Consulting Physician (Podiatry) Larey Dresser, MD as Consulting Physician (Cardiology) Chauncey Cruel, MD OTHER MD:  CHIEF COMPLAINT: triple positive breast cancer (s/p bilateral mastectomies)  CURRENT TREATMENT: completing one year of anti-HER2 immunotherapy; continuing on anastrozole   INTERVAL HISTORY: Tracy Garrett returns today for follow up and treatment of her triple positive breast cancer. She continues on Trastuzumab every 3 weeks, with her last dose due today.  Since her last visit, she underwent repeat echocardiogram on 10/14/2020 showing an ejection fraction of 55-60%.  She is also on anastrozole, with good tolerance.  Hot flashes are not a problem.  Vaginal dryness is stable, has not significantly worsened and is not symptomatic.  She is aware that we do have a pelvic rehab program that is available to her if she wishes   REVIEW OF SYSTEMS: Tracy Garrett is tolerating the trastuzumab with no side effects that she is aware of.  The port is working well.  She is still working from home.  She is being extremely careful regarding exposures to [place].  She is not exercising regularly but occasionally gets on her bike.  A detailed review of systems today was otherwise stable.   COVID 19 VACCINATION STATUS: fully vaccinated [employer]), with booster 05/2020   HISTORY OF CURRENT  ILLNESS: From the original intake note:  Tracy Garrett had routine screening mammography on 05/01/2020 showing a possible abnormality in the right breast. She underwent right diagnostic mammography with tomography at [facility] on 05/09/2019 showing: breast density category A; probably-benign regional calcifications in the right breast at 11 o'clock. Short term follow up was recommended.   She returned for follow up at [facility] on 11/13/2019. She underwent right diagnostic mammography and right breast ultrasonography showing: breast composition A; indeterminate calcifications in upper-outer right breast spanning 5 cm; 0.8 cm irregular mass in right breast at 9 o'clock.  Accordingly on 11/20/2019 she proceeded to biopsy of the right breast areas in question. The pathology from this procedure ([id number]) showed:  1. Right breast, UOQ  - foci of invasive and in situ mammary carcinoma, grade 2-3, e-cadherin positive.  2. Right breast, 9 o'clock  - invasive mammary carcinoma, grade 2-3, e-cadherin positive  - Prognostic indicators significant for: estrogen receptor, 95% positive and progesterone receptor, 25% positive, both with strong staining intensity. Proliferation marker Ki67 at 20%. HER2 equivocal by immunohistochemistry (2+), but positive by fluorescent in situ hybridization with a signals ratio 3.30 and number per cell 7.25.  The patient's subsequent history is as detailed below.   PAST MEDICAL HISTORY: Past Medical History:  Diagnosis Date   Allergy    Anemia    Arthritis    Cancer ([place])    right breast cancer   Cataracts, bilateral    Chronic kidney disease    CKD   Family history of breast cancer    Family history of multiple myeloma    Fatty liver disease, nonalcoholic 10/09/6977   [place] appears normal on follow up US 09/2019  GERD (gastroesophageal reflux disease)    History of kidney stones    x2 episodes   Hyperlipidemia    Hypertension    PONV (postoperative nausea and  vomiting)    Sleep apnea    test was dx, as mild- no further tx. required   Type II or unspecified type diabetes mellitus without mention of complication, not stated as uncontrolled     PAST SURGICAL HISTORY: Past Surgical History:  Procedure Laterality Date   BREAST SURGERY  1999   Reduction Mammoplasties   CHOLECYSTECTOMY  2003   colonoscopsy  11/2007   neg- polyp removal with both procedures x2   CYSTOSCOPY     surgery to extract stone x 1 with stent   EUS N/A 07/26/2014   Procedure: UPPER ENDOSCOPIC ULTRASOUND (EUS) LINEAR;  Surgeon: Beryle Beams, MD;  Location: [facility];  Service: Endoscopy;  Laterality: N/A;   LAPAROTOMY N/A 10/10/2014   Procedure: 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;  Surgeon: TJackolyn Confer MD;  Location: [facility] OR[facility];  Service: General;  Laterality: N/A;   LYSIS OF ADHESION  10/10/2014   Procedure: LYSIS OF ADHESION;  Surgeon: TJackolyn Confer MD;  Location: [facility];  Service: General;;   MASTECTOMY W/ SENTINEL NODE BIOPSY Bilateral 01/02/2020   Procedure: BILATERAL MASTECTOMY WITH RIGHT SENTINEL LYMPH NODE MAPPING;  Surgeon: CErroll Luna MD;  [facility]  Service: General;  Laterality: Bilateral;  PEC BLOCK   PORTACATH PLACEMENT Right 01/02/2020   Procedure: INSERTION PORT-A-CATH WITH ULTRASOUND GUIDANCE;  Surgeon: CErroll Luna MD;  [facility]  Service: General;  Laterality: Right;   TONSILLECTOMY     and adenoids removed age 65  VAGINAL HYSTERECTOMY  2002    FAMILY HISTORY: Family History  Problem Relation Age of Onset   Breast cancer Mother 78  Heart disease Father        smoker   Diabetes type I Half-Sister    Alzheimer's disease Maternal Grandmother    Emphysema Paternal Grandfather        smoker   Heart attack Paternal Grandfather    Lung cancer Paternal Grandfather    CAD Brother 540      stents   Breast cancer Maternal Aunt        dx. in her late 785s  Breast cancer Cousin 580  Cancer Maternal  Uncle        dx. in his 651s tumor on the back of neck   Breast cancer Paternal Aunt        dx. >50   Multiple myeloma Maternal Aunt 92   Multiple myeloma Paternal Aunt        dx. >50   Breast cancer Cousin 561  Her father died at age 65 Her mother died at age 65 She has 2 brothers and had 1 sister who is deceased. She reports breast cancer on her maternal side-- her mother at age 65 an aunt, and a cousin.   GYNECOLOGIC HISTORY:  No LMP recorded. Patient has had a hysterectomy. Menarche: 65years old Age at first live birth: 65years old G[place] 2 LMP unsure Contraceptive: never used HRT never used  Hysterectomy? Yes, 2002 BSO? 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    HEALTH MAINTENANCE: Social History   Tobacco Use   Smoking status: Never   Smokeless tobacco: Never  Vaping Use   Vaping Use: Never used  Substance Use Topics   Alcohol use: No   Drug use: No     Colonoscopy: 02/2019 (Dr. Collene Mares), repeat due 2025  PAP: none on file (s/p hysterectomy)  Bone density: never done   Allergies  Allergen Reactions   Pecan Nut (Diagnostic)     Other reaction(s): Unknown   Hydromorphone Rash    Arm reddened at IV med site   Other Itching and Rash    NUTS CAUSE MOUTH TO [facility]    Current Outpatient Medications  Medication Sig Dispense Refill   anastrozole (ARIMIDEX) 1 MG tablet Take 1 tablet (1 mg total) by mouth daily. 90 tablet 4   aspirin EC 81 MG tablet Take 81 mg by mouth daily. Swallow whole.     cetirizine (ZYRTEC) 10 MG tablet Take 10 mg by mouth daily.     Cholecalciferol (VITAMIN D3) 5000 UNITS TABS Take 5,000 Units by mouth  daily.      glucose blood (CONTOUR NEXT TEST) test strip Check blood sugar 1 time daily-DX-E11.9 100 each 12   losartan (COZAAR) 50 MG tablet TAKE 1/2-1 TAB DAILY FOR BLOOD PRESSURE AND TO PROTECT KIDNEYS. 30 tablet 5   metFORMIN (GLUCOPHAGE XR) 500 MG 24 hr tablet Take 2 tablets with breakfast and 2 tablets with dinner for blood sugar. 360 tablet 0   Omega-3 Fatty Acids (OMEGA 3 PO) Take 1 capsule by mouth 2 (two) times daily.     pantoprazole (PROTONIX) 40 MG tablet Take  1 tablet  Daily  to prevent Heartburn & Indigestion 90 tablet 3   rosuvastatin (CRESTOR) 5 MG tablet Take     1 tablet     Daily      for Cholesterol                     TAKE 1 TABLET BY MOUTH EVERY DAY FOR CHOLESTEROL 90 tablet 0   No current facility-administered medications for this visit.    OBJECTIVE: African-American woman who appears stated age  60:   01/20/21 1144  BP: 128/86  Pulse: 91  Resp: 18  Temp: 97.7 F (36.5 C)  SpO2: 100%      Body mass index is 39.6 kg/m.   Wt Readings from Last 3 Encounters:  01/20/21 216 lb 8 oz (98.2 kg)  12/30/20 221 lb 1.9 oz (100.3 kg)  12/09/20 219 lb (99.3 kg)     ECOG FS:1 - Symptomatic but completely ambulatory  Sclerae unicteric, EOMs intact Wearing a mask No cervical or supraclavicular adenopathy Lungs no rales or rhonchi Heart regular rate and rhythm Abd soft, obese, nontender, positive bowel sounds MSK no focal spinal tenderness, no right upper extremity lymphedema Neuro: nonfocal, well oriented, appropriate affect Breasts: Status post bilateral mastectomies.  There is no evidence of local recurrence.  Both axillae are benign.   LAB RESULTS:  CMP     Component Value Date/Time   NA 138 12/30/2020 0912   K 3.9 12/30/2020 0912   CL 101 12/30/2020 0912   CO2 26 12/30/2020 0912   GLUCOSE 187 (H) 12/30/2020 0912   BUN 18 12/30/2020 0912   CREATININE 1.02 (H) 12/30/2020 0912   CREATININE 0.95 09/03/2020 1558   CALCIUM 9.0 12/30/2020  0912   PROT  6.8 12/30/2020 0912   ALBUMIN 3.5 12/30/2020 0912   AST 12 (L) 12/30/2020 0912   AST 10 (L) 11/28/2019 1217   ALT 13 12/30/2020 0912   ALT 16 11/28/2019 1217   ALKPHOS 66 12/30/2020 0912   BILITOT 0.6 12/30/2020 0912   BILITOT 0.6 11/28/2019 1217   GFRNONAA >60 12/30/2020 0912   GFRNONAA 63 09/03/2020 1558   GFRAA 73 09/03/2020 1558    Lab Results  Component Value Date   ALBUMINELP 3.9 08/23/2019   A1GS 0.4 (H) 08/23/2019   A2GS 0.9 08/23/2019   BETS 0.5 08/23/2019   BETA2SER 0.4 08/23/2019   GAMS 0.9 08/23/2019   SPEI  08/23/2019     Comment:     . Alpha-1 globulin increase noted. .     Lab Results  Component Value Date   WBC 7.8 01/20/2021   NEUTROABS 5.4 01/20/2021   HGB 11.7 (L) 01/20/2021   HCT 34.2 (L) 01/20/2021   MCV 88.6 01/20/2021   PLT 215 01/20/2021    No results found for: LABCA2  No components found for: [id number]  No results for input(s): INR in the last 168 hours.  No results found for: LABCA2  No results found for: WUJ811  No results found for: BJY782  No results found for: NFA213  No results found for: CA2729  No components found for: HGQUANT  No results found for: CEA1 / No results found for: CEA1   No results found for: AFPTUMOR  No results found for: CHROMOGRNA  No results found for: KPAFRELGTCHN, LAMBDASER, KAPLAMBRATIO (kappa/lambda light chains)  No results found for: HGBA, HGBA2QUANT, HGBFQUANT, HGBSQUAN (Hemoglobinopathy evaluation)   No results found for: LDH  Lab Results  Component Value Date   IRON 54 11/26/2019   TIBC 378 11/26/2019   IRONPCTSAT 14 (L) 11/26/2019   (Iron and TIBC)  Lab Results  Component Value Date   FERRITIN 91 05/21/2015    Urinalysis    Component Value Date/Time   COLORURINE YELLOW 09/03/2020 1558   APPEARANCEUR CLEAR 09/03/2020 1558   LABSPEC 1.003 09/03/2020 1558   PHURINE 6.5 09/03/2020 1558   GLUCOSEU NEGATIVE 09/03/2020 1558   HGBUR NEGATIVE 09/03/2020 1558    BILIRUBINUR NEG 11/22/2013 1031   BILIRUBINUR neg 10/28/2013 [place] 09/03/2020 1558   PROTEINUR NEGATIVE 09/03/2020 1558   UROBILINOGEN 0.2 11/22/2013 1031   NITRITE NEGATIVE 08/13/2019 1359   LEUKOCYTESUR 1+ (A) 08/13/2019 1359    STUDIES: No results found.    ELIGIBLE FOR AVAILABLE RESEARCH PROTOCOL: AET  ASSESSMENT: 64 y.o. [place] woman status post right breast upper outer quadrant biopsy 11/20/2019 for a clinical mT1b N0, stage IA invasive ductal carcinoma, grade 2 or 3, estrogen and progesterone receptor positive, HER-2 amplified, with an MIB-1 of 20%  (1) status post bilateral mastectomies 01/02/2020 showing  (a) on the right, an mpT2 pN0(i+), stage IB invasive ductal carcinoma, triple positive, with close but negative margins; 2 sentinel lymph nodes removed  (b) on the left, no malignancy identified  (c) the patient is not planning on reconstruction  (2) adjuvant chemo immunotherapy consisting of Abraxane weekly x12 with trastuzumab started 02/04/2020 completed 04/21/2020  (a) echo 12/13/2019 shows an ejection fraction in the 55-60% range  (b) echo 04/03/2020 shows an ejection fraction in the 55-60% range.  (c) echos 07/22/2020 and 10/14/2020 show no change in EF  (3) trastuzumab continued to total 1 year (last dose 01/20/2021)  (4) adjuvant radiation 05/14/2020 through 06/30/2020 Site  Technique Total Dose (Gy) Dose per Fx (Gy) Completed Fx Beam Energies  Chest Wall, Right: CW_Rt 3D 50/50 2 25/25 10X, 15X  Chest Wall, Right: CW_Rt_PAB_SCV 3D 45/45 1.8 25/25 10X, 15X  Chest Wall, Right: CW_Rt_Bst Electron 10/10 2 5/5 6E   (5) genetics testing 12/16/2019 through the [facility] Breast Cancer STAT panel + Common Hereditary Cancers panel found no deleterious mutations in ATM, BRCA1, BRCA2, CDH1, CHEK2, PALB2, PTEN, STK11 and TP53. The Common Hereditary Cancers Panel offered by [facility] includes sequencing and/or deletion duplication testing of the following 48  genes: APC, ATM, AXIN2, BARD1, BMPR1A, BRCA1, BRCA2, BRIP1, CDH1, CDK4, CDKN2A (p14ARF), CDKN2A (p16INK4a), CHEK2, CTNNA1, DICER1, EPCAM (Deletion/duplication testing only), GREM1 (promoter region deletion/duplication testing only), KIT, MEN1, MLH1, MSH2, MSH3, MSH6, MUTYH, NBN, NF1, NTHL1, PALB2, PDGFRA, PMS2, POLD1, POLE, PTEN, RAD50, RAD51C, RAD51D, RNF43, SDHB, SDHC, SDHD, SMAD4, SMARCA4. STK11, TP53, TSC1, TSC2, and VHL.  The following genes were evaluated for sequence changes only: SDHA and HOXB13 c.251G>A variant only.  (a) Two variants of uncertain significance (VUS) were detected - one in the BRIP1 gene called c.1735C>T and a second in the POLD1 gene called c.2861C>G.   (6) anastrozole started 10/27/2020  (a) [facility] 06/25/2020 shows a T score of -1.1  PLAN: Tracy Garrett completes her year of trastuzumab today.  She has tolerated this well with her most recent echo May 2022 showing no significant drop in her ejection fraction.  She is eager to have her port removed and I have sent her surgeon Dr. Brantley Stage a note alerting him of that  She is tolerating anastrozole well and the plan is to continue that a minimum of 5years.  She will be due for repeat bone density January 2024.  We discussed the fact that local recurrence after bilateral mastectomies is possible and usually picked up first by the patient.  She understands that taking her chest wall occasional when she showers is good practice  Otherwise she will return to see [place] in about 3 months.  She knows to call for any other issue that may develop before then  Total encounter time 25 minutes.Sarajane Jews C. Geralda Baumgardner, MD 01/20/21 11:51 AM Medical Oncology and Hematology [facility] [place],  [address] Tel. [phone]    Fax. [phone]   I, Wilburn Mylar, am acting as scribe for Dr. Virgie Dad. Laurel Smeltz.  I, Lurline Del MD, have reviewed the above documentation for accuracy and  completeness, and I agree with the above.   *Total Encounter Time as defined by the Centers for Medicare and Medicaid Services includes, in addition to the face-to-face time of a patient visit (documented in the note above) non-face-to-face time: obtaining and reviewing outside history, ordering and reviewing medications, tests or procedures, care coordination (communications with other health care professionals or caregivers) and documentation in the medical record.

## 2021-01-20 NOTE — Patient Instructions (Signed)
[facility] MEDICAL [facility]COLOG[facility]  Discharge Instructions: Thank you for choosing [facility] to provide your oncology and hematology care.   If you have a lab appointment with the Cancer Center, please go directly to the Cancer Center and check in at the registration area.   Wear comfortable clothing and clothing appropriate for easy access to any Portacath or PICC line.   We strive to give you quality time with your provider. You may need to reschedule your appointment if you arrive late (15 or more minutes).  Arriving late affects you and other patients whose appointments are after yours.  Also, if you miss three or more appointments without notifying the office, you may be dismissed from the clinic at the provider's discretion.      For prescription refill requests, have your pharmacy contact our office and allow 72 hours for refills to be completed.    Today you received the following chemotherapy and/or immunotherapy agents Trastuzumab-anns ([place])      To help prevent nausea and vomiting after your treatment, we encourage you to take your nausea medication as directed.  BELOW ARE SYMPTOMS THAT SHOULD BE REPORTED IMMEDIATELY: *FEVER GREATER THAN 100.4 F (38 C) OR HIGHER *CHILLS OR SWEATING *NAUSEA AND VOMITING THAT IS NOT CONTROLLED WITH YOUR NAUSEA MEDICATION *UNUSUAL SHORTNESS OF BREATH *UNUSUAL BRUISING OR BLEEDING *URINARY PROBLEMS (pain or burning when urinating, or frequent urination) *BOWEL PROBLEMS (unusual diarrhea, constipation, pain near the anus) TENDERNESS IN MOUTH AND THROAT WITH OR WITHOUT PRESENCE OF ULCERS (sore throat, sores in mouth, or a toothache) UNUSUAL RASH, SWELLING OR PAIN  UNUSUAL VAGINAL DISCHARGE OR ITCHING   Items with * indicate a potential emergency and should be followed up as soon as possible or go to the Emergency Department if any problems should occur.  Please show the CHEMOTHERAPY ALERT CARD or IMMUNOTHERAPY ALERT  CARD at check-in to the Emergency Department and triage nurse.  Should you have questions after your visit or need to cancel or reschedule your appointment, please contact [facility]  Dept: [phone]  and follow the prompts.  Office hours are 8:00 a.m. to 4:30 p.m. Monday - Friday. Please note that voicemails left after 4:00 p.m. may not be returned until the following business day.  We are closed weekends and major holidays. You have access to a nurse at all times for urgent questions. Please call the main number to the clinic Dept: [phone] and follow the prompts.   For any non-urgent questions, you may also contact your provider using MyChart. We now offer e-Visits for anyone 18 and older to request care online for non-urgent symptoms. For details visit [email or web address].   Also download the MyChart app! Go to the app store, search "MyChart", open the app, select [place], and log in with your MyChart username and password.  Due to Covid, a mask is required upon entering the hospital/clinic. If you do not have a mask, one will be given to you upon arrival. For doctor visits, patients may have 1 support person aged 18 or older with them. For treatment visits, patients cannot have anyone with them due to current Covid guidelines and our immunocompromised population.   

## 2021-02-12 ENCOUNTER — Other Ambulatory Visit: Payer: Self-pay

## 2021-02-12 DIAGNOSIS — E119 Type 2 diabetes mellitus without complications: Secondary | ICD-10-CM

## 2021-02-12 DIAGNOSIS — K219 Gastro-esophageal reflux disease without esophagitis: Secondary | ICD-10-CM

## 2021-02-12 DIAGNOSIS — I10 Essential (primary) hypertension: Secondary | ICD-10-CM

## 2021-02-12 MED ORDER — METFORMIN HCL ER 500 MG PO TB24: ORAL_TABLET | ORAL | 0 refills | Status: DC

## 2021-02-12 MED ORDER — PANTOPRAZOLE SODIUM 40 MG PO TBEC: DELAYED_RELEASE_TABLET | ORAL | 0 refills | Status: DC

## 2021-02-12 MED ORDER — LOSARTAN POTASSIUM 50 MG PO TABS: ORAL_TABLET | ORAL | 0 refills | Status: DC

## 2021-03-24 ENCOUNTER — Encounter: Payer: Self-pay | Admitting: Oncology

## 2021-03-24 ENCOUNTER — Other Ambulatory Visit (HOSPITAL_COMMUNITY): Payer: Self-pay

## 2021-03-24 MED ORDER — INFLUENZA VAC SPLIT QUAD 0.5 ML IM SUSY
0.5000 mL | PREFILLED_SYRINGE | INTRAMUSCULAR | 0 refills | Status: AC
  Filled 2021-03-24: qty 0.5, 1d supply, fill #0

## 2021-03-30 ENCOUNTER — Encounter: Payer: Self-pay | Admitting: Internal Medicine

## 2021-03-30 LAB — HM DIABETES EYE EXAM

## 2021-04-20 ENCOUNTER — Other Ambulatory Visit: Payer: Self-pay | Admitting: Internal Medicine

## 2021-04-20 ENCOUNTER — Encounter: Payer: Self-pay | Admitting: Oncology

## 2021-04-20 DIAGNOSIS — E119 Type 2 diabetes mellitus without complications: Secondary | ICD-10-CM

## 2021-04-26 ENCOUNTER — Encounter: Payer: Self-pay | Admitting: Oncology

## 2021-04-26 NOTE — Progress Notes (Signed)
[place]  [phone] [phone]     ID: Tracy Garrett DOB: [birth year]  MR#: [id number]  QJJ#:[id number]  Patient Care Team: Unk Pinto, MD as PCP - General (Internal Medicine) [name], RN as Oncology Nurse Navigator [name], RN as Oncology Nurse Navigator Erroll Luna, MD as Consulting Physician (General Surgery) Lilianne Delair, Virgie Dad, MD as Consulting Physician (Oncology) Eppie Gibson, MD as Attending Physician (Radiation Oncology) Mcarthur Rossetti, MD as Consulting Physician (Orthopedic Surgery) Regal, Tamala Fothergill, DPM as Consulting Physician (Podiatry) Larey Dresser, MD as Consulting Physician (Cardiology) Chauncey Cruel, MD OTHER MD:  CHIEF COMPLAINT: triple positive breast cancer (s/p bilateral mastectomies)  CURRENT TREATMENT: anastrozole   INTERVAL HISTORY: Cloa returns today for follow up of her triple positive breast cancer.   She continues on anastrozole with good tolerance.  Hot flashes are not a problem.  Vaginal dryness is stable, has not significantly worsened and is not symptomatic.  She is aware that we do have a pelvic rehab program that is available to her if she wishes  DEXA scan 06/25/2020 at [facility] showed a T score of -1.1. which is minimally osteopenic  REVIEW OF SYSTEMS: Inita has particularly right knee pain.  This keeps her from walking or otherwise exercising regularly.  She does have some night sweats but these are relatively rare and she does not think taking gabapentin to relieve these would be necessary.  She is doing her best to lose weight.  A detailed review of systems today was otherwise stable   COVID 19 VACCINATION STATUS: fully vaccinated [employer]), with booster 05/2020; also had COVID in 2020   HISTORY OF CURRENT ILLNESS: From the original intake note:  Tracy Garrett had routine screening mammography on 05/01/2020 showing a possible abnormality in  the right breast. She underwent right diagnostic mammography with tomography at [facility] on 05/09/2019 showing: breast density category A; probably-benign regional calcifications in the right breast at 11 o'clock. Short term follow up was recommended.   She returned for follow up at [facility] on 11/13/2019. She underwent right diagnostic mammography and right breast ultrasonography showing: breast composition A; indeterminate calcifications in upper-outer right breast spanning 5 cm; 0.8 cm irregular mass in right breast at 9 o'clock.  Accordingly on 11/20/2019 she proceeded to biopsy of the right breast areas in question. The pathology from this procedure ([id number]) showed:  1. Right breast, UOQ  - foci of invasive and in situ mammary carcinoma, grade 2-3, e-cadherin positive.  2. Right breast, 9 o'clock  - invasive mammary carcinoma, grade 2-3, e-cadherin positive  - Prognostic indicators significant for: estrogen receptor, 95% positive and progesterone receptor, 25% positive, both with strong staining intensity. Proliferation marker Ki67 at 20%. HER2 equivocal by immunohistochemistry (2+), but positive by fluorescent in situ hybridization with a signals ratio 3.30 and number per cell 7.25.  The patient's subsequent history is as detailed below.   PAST MEDICAL HISTORY: Past Medical History:  Diagnosis Date   Allergy    Anemia    Arthritis    Cancer ([place])    right breast cancer   Cataracts, bilateral    Chronic kidney disease    CKD   Family history of breast cancer    Family history of multiple myeloma    Fatty liver disease, nonalcoholic 9/70/2637   [place] appears normal on follow up US 09/2019   GERD (gastroesophageal reflux disease)    History of kidney stones    x2 episodes  Hyperlipidemia    Hypertension    PONV (postoperative nausea and vomiting)    Sleep apnea    test was dx, as mild- no further tx. required   Type II or unspecified type diabetes mellitus without mention of  complication, not stated as uncontrolled     PAST SURGICAL HISTORY: Past Surgical History:  Procedure Laterality Date   BREAST SURGERY  1999   Reduction Mammoplasties   CHOLECYSTECTOMY  2003   colonoscopsy  11/2007   neg- polyp removal with both procedures x2   CYSTOSCOPY     surgery to extract stone x 1 with stent   EUS N/A 07/26/2014   Procedure: UPPER ENDOSCOPIC ULTRASOUND (EUS) LINEAR;  Surgeon: Beryle Beams, MD;  Location: [facility];  Service: Endoscopy;  Laterality: N/A;   LAPAROTOMY N/A 10/10/2014   Procedure: 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;  Surgeon: TJackolyn Confer MD;  Location: [facility] ORS;  Service: General;  Laterality: N/A;   LYSIS OF ADHESION  10/10/2014   Procedure: LYSIS OF ADHESION;  Surgeon: TJackolyn Confer MD;  Location: [facility] ORS;  Service: General;;   MASTECTOMY W/ SENTINEL NODE BIOPSY Bilateral 01/02/2020   Procedure: BILATERAL MASTECTOMY WITH RIGHT SENTINEL LYMPH NODE MAPPING;  Surgeon: CErroll Luna MD;  Location: [facility]  Service: General;  Laterality: Bilateral;  PEC BLOCK   PORTACATH PLACEMENT Right 01/02/2020   Procedure: INSERTION PORT-A-CATH WITH ULTRASOUND GUIDANCE;  Surgeon: CErroll Luna MD;  [facility]  Service: General;  Laterality: Right;   TONSILLECTOMY     and adenoids removed age [age]  VAGINAL HYSTERECTOMY  2002    FAMILY HISTORY: Family History  Problem Relation Age of Onset   Breast cancer Mother 65  Heart disease Father        smoker   Diabetes type I Half-Sister    Alzheimer's disease Maternal Grandmother    Emphysema Paternal Grandfather        smoker   Heart attack Paternal Grandfather    Lung cancer Paternal Grandfather    CAD Brother 65      stents   Breast cancer Maternal Aunt        dx. in her late 727s  Breast cancer Cousin 65  Cancer Maternal Uncle        dx. in his 65s tumor on the back of neck   Breast cancer Paternal Aunt        dx. >50   Multiple myeloma Maternal Aunt 92    Multiple myeloma Paternal Aunt        dx. >50   Breast cancer Cousin 65  Her father died at age 36272 Her mother died at age 67108 She has 65 brothers and had 1 sister who is deceased. She reports breast cancer on her maternal side-- her mother at age 65 an aunt, and a cousin.   GYNECOLOGIC HISTORY:  No LMP recorded. Patient has had a hysterectomy. Menarche: [age]years old Age at first live birth: 65years old [place] 2 LMP unsure Contraceptive: never used HRT never used  Hysterectomy? Yes, 2002 BSO? 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    HEALTH MAINTENANCE: Social History   Tobacco Use   Smoking status: Never   Smokeless tobacco: Never  Vaping Use   Vaping Use: Never used  Substance Use Topics   Alcohol use: No   Drug use: No     Colonoscopy: 02/2019 (Dr. Collene Mares), repeat due 2025  PAP: none on file (s/p hysterectomy)  Bone density: never done   Allergies  Allergen Reactions   Pecan Nut (Diagnostic)     Other reaction(s): Unknown   Hydromorphone Rash    Arm reddened at IV med site   Other Itching and Rash    NUTS CAUSE MOUTH TO [facility]    Current Outpatient Medications  Medication Sig Dispense Refill   anastrozole (ARIMIDEX) 1 MG tablet Take 1 tablet (1 mg total) by mouth daily. 90 tablet 4   aspirin EC 81 MG tablet Take 81 mg by mouth daily. Swallow whole.     cetirizine (ZYRTEC) 10 MG tablet Take 10 mg by mouth daily.     Cholecalciferol (VITAMIN D3) 5000 UNITS TABS Take 5,000 Units by mouth daily.      glucose blood (CONTOUR NEXT TEST) test strip Check blood sugar 1 time daily-DX-E11.9 100 each 12   influenza vac split quadrivalent  PF (FLUARIX) 0.5 ML injection Inject 0.5 mLs into the muscle. 0.5 mL 0   losartan (COZAAR) 50 MG tablet Take 1/2-1 tab daily for blood pressure 90 tablet 0   metFORMIN (GLUCOPHAGE-XR) 500 MG 24 hr tablet TAKE 2 TABLETS BY MOUTH  WITH BREAKFAST AND 2  TABLETS BY MOUTH WITH  DINNER FOR BLOOD SUGAR 360 tablet 3   Omega-3 Fatty Acids (OMEGA 3 PO) Take 1 capsule by mouth 2 (two) times daily.     pantoprazole (PROTONIX) 40 MG tablet Take  1 tablet  Daily  to prevent Heartburn & Indigestion 90 tablet 0   rosuvastatin (CRESTOR) 5 MG tablet Take     1 tablet     Daily      for Cholesterol                     TAKE 1 TABLET BY MOUTH EVERY DAY FOR CHOLESTEROL 90 tablet 0   No current facility-administered medications for this visit.    OBJECTIVE: African-American woman using a cane  Vitals:   04/27/21 1545  BP: 137/82  Pulse: 86  Resp: 18  Temp: 97.7 F (36.5 C)  SpO2: 97%      Body mass index is 38.89 kg/m.   Wt Readings from Last 3 Encounters:  04/27/21 212 lb 9.6 oz (96.4 kg)  01/20/21 216 lb 8 oz (98.2 kg)  12/30/20 221 lb 1.9 oz (100.3 kg)     ECOG FS:1 - Symptomatic but completely ambulatory  Sclerae unicteric, EOMs intact Wearing a mask No cervical or supraclavicular adenopathy Lungs no rales or rhonchi Heart regular rate and rhythm Abd soft, obese nontender, positive bowel sounds MSK no focal spinal tenderness, no upper extremity lymphedema Neuro: nonfocal, well oriented, appropriate affect Breasts: Status post bilateral mastectomies with no evidence of chest wall recurrence.  Both axillae are benign.   LAB RESULTS:  CMP     Component Value Date/Time   NA 140 04/27/2021 1513   K 3.5 04/27/2021 1513   CL 104 04/27/2021 1513   CO2 26 04/27/2021 1513   GLUCOSE 284 (H) 04/27/2021 1513   BUN 12 04/27/2021 1513   CREATININE 1.07 (H) 04/27/2021 1513   CREATININE 0.95 09/03/2020  1558   CALCIUM 8.8 (L) 04/27/2021 1513   PROT 6.8 04/27/2021 1513   ALBUMIN 3.5 04/27/2021 1513    AST 10 (L) 04/27/2021 1513   ALT 14 04/27/2021 1513   ALKPHOS 65 04/27/2021 1513   BILITOT 0.3 04/27/2021 1513   GFRNONAA 58 (L) 04/27/2021 1513   GFRNONAA 63 09/03/2020 1558   GFRAA 73 09/03/2020 1558    Lab Results  Component Value Date   ALBUMINELP 3.9 08/23/2019   A1GS 0.4 (H) 08/23/2019   A2GS 0.9 08/23/2019   BETS 0.5 08/23/2019   BETA2SER 0.4 08/23/2019   GAMS 0.9 08/23/2019   SPEI  08/23/2019     Comment:     . Alpha-1 globulin increase noted. .     Lab Results  Component Value Date   WBC 7.0 04/27/2021   NEUTROABS 4.4 04/27/2021   HGB 11.3 (L) 04/27/2021   HCT 34.0 (L) 04/27/2021   MCV 91.2 04/27/2021   PLT 260 04/27/2021    No results found for: LABCA2  No components found for: [id number]  No results for input(s): INR in the last 168 hours.  No results found for: LABCA2  No results found for: ZJI967  No results found for: ELF810  No results found for: FBP102  No results found for: CA2729  No components found for: HGQUANT  No results found for: CEA1 / No results found for: CEA1   No results found for: AFPTUMOR  No results found for: CHROMOGRNA  No results found for: KPAFRELGTCHN, LAMBDASER, KAPLAMBRATIO (kappa/lambda light chains)  No results found for: HGBA, HGBA2QUANT, HGBFQUANT, HGBSQUAN (Hemoglobinopathy evaluation)   No results found for: LDH  Lab Results  Component Value Date   IRON 54 11/26/2019   TIBC 378 11/26/2019   IRONPCTSAT 14 (L) 11/26/2019   (Iron and TIBC)  Lab Results  Component Value Date   FERRITIN 91 05/21/2015    Urinalysis    Component Value Date/Time   COLORURINE YELLOW 09/03/2020 1558   APPEARANCEUR CLEAR 09/03/2020 1558   LABSPEC 1.003 09/03/2020 1558   PHURINE 6.5 09/03/2020 1558   GLUCOSEU NEGATIVE 09/03/2020 1558   HGBUR NEGATIVE 09/03/2020 1558   BILIRUBINUR NEG 11/22/2013 1031   BILIRUBINUR neg 10/28/2013 [place] 09/03/2020 1558   PROTEINUR NEGATIVE 09/03/2020 1558    UROBILINOGEN 0.2 11/22/2013 1031   NITRITE NEGATIVE 08/13/2019 1359   LEUKOCYTESUR 1+ (A) 08/13/2019 1359    STUDIES: No results found.   ELIGIBLE FOR AVAILABLE RESEARCH PROTOCOL: AET  ASSESSMENT: 65 y.o. [place] woman status post right breast upper outer quadrant biopsy 11/20/2019 for a clinical mT1b N0, stage IA invasive ductal carcinoma, grade 2 or 3, estrogen and progesterone receptor positive, HER-2 amplified, with an MIB-1 of 20%  (1) status post bilateral mastectomies 01/02/2020 showing  (a) on the right, an mpT2 pN0(i+), stage IB invasive ductal carcinoma, triple positive, with close but negative margins; 2 sentinel lymph nodes removed  (b) on the left, no malignancy identified  (c) the patient is not planning on reconstruction  (2) adjuvant chemo immunotherapy consisting of Abraxane weekly x12 with trastuzumab started 02/04/2020 completed 04/21/2020  (a) echo 12/13/2019 shows an ejection fraction in the 55-60% range  (b) echo 04/03/2020 shows an ejection fraction in the 55-60% range.  (c) echos 07/22/2020 and 10/14/2020 show no change in EF  (3) trastuzumab continued to total 1 year (last dose 01/20/2021)  (4) adjuvant radiation 05/14/2020 through 06/30/2020 Site Technique Total Dose (Gy) Dose per Fx (Gy) Completed Fx Beam Energies  Chest Wall, Right: CW_Rt 3D 50/50 2 25/25 10X, 15X  Chest Wall, Right: CW_Rt_PAB_SCV 3D 45/45 1.8 25/25 10X, 15X  Chest Wall, Right: CW_Rt_Bst Electron 10/10 2 5/5 6E   (5) genetics testing 12/16/2019 through the [facility] Breast Cancer STAT panel + Common Hereditary Cancers panel found no deleterious mutations in ATM, BRCA1, BRCA2, CDH1, CHEK2, PALB2, PTEN, STK11 and TP53. The Common Hereditary Cancers Panel offered by [facility] includes sequencing and/or deletion duplication testing of the following 48 genes: APC, ATM, AXIN2, BARD1, BMPR1A, BRCA1, BRCA2, BRIP1, CDH1, CDK4, CDKN2A (p14ARF), CDKN2A (p16INK4a), CHEK2, CTNNA1, DICER1, EPCAM  (Deletion/duplication testing only), GREM1 (promoter region deletion/duplication testing only), KIT, MEN1, MLH1, MSH2, MSH3, MSH6, MUTYH, NBN, NF1, NTHL1, PALB2, PDGFRA, PMS2, POLD1, POLE, PTEN, RAD50, RAD51C, RAD51D, RNF43, SDHB, SDHC, SDHD, SMAD4, SMARCA4. STK11, TP53, TSC1, TSC2, and VHL.  The following genes were evaluated for sequence changes only: SDHA and HOXB13 c.251G>A variant only.  (a) Two variants of uncertain significance (VUS) were detected - one in the BRIP1 gene called c.1735C>T and a second in the POLD1 gene called c.2861C>G.   (6) anastrozole started 10/27/2020  (a) bone density at [facility] 06/25/2020 shows a T score of -1.1  PLAN: Libbi is about a year and a half out from definitive surgery for her breast cancer with no evidence of disease recurrence.  This is very favorable.  She is tolerating anastrozole well and the plan is to continue that a total of 5 years.  She will need a repeat bone density January 2024 but right now she has minimal osteopenia.  We discussed her right knee issues and options she has to relieve that so she can exercise regularly.  Otherwise she will return to see [place] in about 6 months.  She will be just about 2 years out at that point and we can consider broadening the follow-up interval to yearly.  Total encounter time 25 minutes.Sarajane Jews C. Natassja Ollis, MD 04/27/21 6:02 PM Medical Oncology and Hematology [facility] [place], [place] [address] Tel. [phone]    Fax. [phone]   I, Wilburn Mylar, am acting as scribe for Dr. Virgie Dad. Exzavier Ruderman.  I, Lurline Del MD, have reviewed the above documentation for accuracy and completeness, and I agree with the above.   *Total Encounter Time as defined by the Centers for Medicare and Medicaid Services includes, in addition to the face-to-face time of a patient visit (documented in the note above) non-face-to-face time: obtaining and reviewing outside history,  ordering and reviewing medications, tests or procedures, care coordination (communications with other health care professionals or caregivers) and documentation in the medical record.

## 2021-04-27 ENCOUNTER — Other Ambulatory Visit: Payer: Self-pay

## 2021-04-27 ENCOUNTER — Other Ambulatory Visit: Payer: Self-pay | Admitting: *Deleted

## 2021-04-27 ENCOUNTER — Inpatient Hospital Stay: Payer: Medicare Other | Admitting: Oncology

## 2021-04-27 ENCOUNTER — Inpatient Hospital Stay: Payer: Medicare Other | Attending: Oncology

## 2021-04-27 VITALS — BP 137/82 | HR 86 | Temp 97.7°F | Resp 18 | Ht 62.0 in | Wt 212.6 lb

## 2021-04-27 DIAGNOSIS — M8580 Other specified disorders of bone density and structure, unspecified site: Secondary | ICD-10-CM | POA: Insufficient documentation

## 2021-04-27 DIAGNOSIS — C50411 Malignant neoplasm of upper-outer quadrant of right female breast: Secondary | ICD-10-CM | POA: Diagnosis present

## 2021-04-27 DIAGNOSIS — Z79811 Long term (current) use of aromatase inhibitors: Secondary | ICD-10-CM | POA: Diagnosis not present

## 2021-04-27 DIAGNOSIS — Z9013 Acquired absence of bilateral breasts and nipples: Secondary | ICD-10-CM | POA: Diagnosis not present

## 2021-04-27 DIAGNOSIS — Z170 Estrogen receptor positive status [ER+]: Secondary | ICD-10-CM | POA: Diagnosis not present

## 2021-04-27 LAB — CBC WITH DIFFERENTIAL (CANCER CENTER ONLY)
Abs Immature Granulocytes: 0.02 10*3/uL (ref 0.00–0.07)
Basophils Absolute: 0 10*3/uL (ref 0.0–0.1)
Basophils Relative: 0 %
Eosinophils Absolute: 0.2 10*3/uL (ref 0.0–0.5)
Eosinophils Relative: 3 %
HCT: 34 % — ABNORMAL LOW (ref 36.0–46.0)
Hemoglobin: 11.3 g/dL — ABNORMAL LOW (ref 12.0–15.0)
Immature Granulocytes: 0 %
Lymphocytes Relative: 26 %
Lymphs Abs: 1.8 10*3/uL (ref 0.7–4.0)
MCH: 30.3 pg (ref 26.0–34.0)
MCHC: 33.2 g/dL (ref 30.0–36.0)
MCV: 91.2 fL (ref 80.0–100.0)
Monocytes Absolute: 0.6 10*3/uL (ref 0.1–1.0)
Monocytes Relative: 8 %
Neutro Abs: 4.4 10*3/uL (ref 1.7–7.7)
Neutrophils Relative %: 63 %
Platelet Count: 260 10*3/uL (ref 150–400)
RBC: 3.73 MIL/uL — ABNORMAL LOW (ref 3.87–5.11)
RDW: 13.8 % (ref 11.5–15.5)
WBC Count: 7 10*3/uL (ref 4.0–10.5)
nRBC: 0 % (ref 0.0–0.2)

## 2021-04-27 LAB — CMP (CANCER CENTER ONLY)
ALT: 14 U/L (ref 0–44)
AST: 10 U/L — ABNORMAL LOW (ref 15–41)
Albumin: 3.5 g/dL (ref 3.5–5.0)
Alkaline Phosphatase: 65 U/L (ref 38–126)
Anion gap: 10 (ref 5–15)
BUN: 12 mg/dL (ref 8–23)
CO2: 26 mmol/L (ref 22–32)
Calcium: 8.8 mg/dL — ABNORMAL LOW (ref 8.9–10.3)
Chloride: 104 mmol/L (ref 98–111)
Creatinine: 1.07 mg/dL — ABNORMAL HIGH (ref 0.44–1.00)
GFR, Estimated: 58 mL/min — ABNORMAL LOW (ref >60)
Glucose, Bld: 284 mg/dL — ABNORMAL HIGH (ref 70–99)
Potassium: 3.5 mmol/L (ref 3.5–5.1)
Sodium: 140 mmol/L (ref 135–145)
Total Bilirubin: 0.3 mg/dL (ref 0.3–1.2)
Total Protein: 6.8 g/dL (ref 6.5–8.1)

## 2021-04-27 MED ORDER — ANASTROZOLE 1 MG PO TABS: 1.0000 mg | ORAL_TABLET | Freq: Every day | ORAL | 4 refills | Status: DC

## 2021-04-28 ENCOUNTER — Telehealth: Payer: Self-pay | Admitting: Oncology

## 2021-04-28 NOTE — Telephone Encounter (Signed)
Scheduled appointment per 11/21 los. Patient is aware. 

## 2021-05-13 ENCOUNTER — Other Ambulatory Visit: Payer: Self-pay | Admitting: Internal Medicine

## 2021-05-13 DIAGNOSIS — I10 Essential (primary) hypertension: Secondary | ICD-10-CM

## 2021-05-13 DIAGNOSIS — K219 Gastro-esophageal reflux disease without esophagitis: Secondary | ICD-10-CM

## 2021-06-12 ENCOUNTER — Other Ambulatory Visit: Payer: Self-pay | Admitting: Internal Medicine

## 2021-06-29 ENCOUNTER — Encounter: Payer: Self-pay | Admitting: Internal Medicine

## 2021-08-01 ENCOUNTER — Encounter (HOSPITAL_BASED_OUTPATIENT_CLINIC_OR_DEPARTMENT_OTHER): Payer: Self-pay

## 2021-08-01 ENCOUNTER — Emergency Department (HOSPITAL_BASED_OUTPATIENT_CLINIC_OR_DEPARTMENT_OTHER)
Admission: EM | Admit: 2021-08-01 | Discharge: 2021-08-01 | Disposition: A | Discharge status: Home / Self Care | Payer: Medicare Other | Attending: Emergency Medicine | Admitting: Emergency Medicine

## 2021-08-01 ENCOUNTER — Other Ambulatory Visit: Payer: Self-pay

## 2021-08-01 DIAGNOSIS — H9202 Otalgia, left ear: Secondary | ICD-10-CM | POA: Insufficient documentation

## 2021-08-01 DIAGNOSIS — Z7982 Long term (current) use of aspirin: Secondary | ICD-10-CM | POA: Diagnosis not present

## 2021-08-01 NOTE — ED Notes (Signed)
Dc instructions reviewed with patient. Patient voiced understanding. Dc with belongings.  °

## 2021-08-01 NOTE — ED Triage Notes (Signed)
Pt arrives POV with her husband w/ c/o of pain behind the left ear starting yesterday, pain comes and goes.   Denies drainage or injury to ear.  Denies any recent illnesses, fevers, ringing in ears or hearing loss.  There was some pain to the left eye, but is very minimal today.  Has history of post nasal drip.  NAD noted in triage.

## 2021-08-01 NOTE — ED Provider Notes (Signed)
[place] EMERGENCY DEPT Provider Note   CSN: [id number] Arrival date & time: 08/01/21  1518     History  Chief Complaint  Patient presents with   Otalgia    Left    Tracy Garrett is a 66 y.o. female.  Patient is a 66 year old female who presents with some pain behind her left ear.  She says it started yesterday.  She describes it as a sharp lightening bolt pain that only lasted few seconds.  It comes and goes.  She had 1 episode this morning and one episode when she was driving to the emergency department but has not had any since then.  She has some chronic postnasal drip but no change in that.  She has no vision changes.  No headache.  No dizziness.  No hearing changes.  No fevers.  No speech deficits or numbness to her face.  No numbness or weakness to her extremities.  She does have a history of TMJ.      Home Medications Prior to Admission medications   Medication Sig Start Date End Date Taking? Authorizing Provider  anastrozole (ARIMIDEX) 1 MG tablet Take 1 tablet (1 mg total) by mouth daily. 04/27/21   Magrinat, Virgie Dad, MD  aspirin EC 81 MG tablet Take 81 mg by mouth daily. Swallow whole.    [provider]  cetirizine (ZYRTEC) 10 MG tablet Take 10 mg by mouth daily.    [provider]  Cholecalciferol (VITAMIN D3) 5000 UNITS TABS Take 5,000 Units by mouth daily.     [provider]  glucose blood (CONTOUR NEXT TEST) test strip Check blood sugar 1 time daily-DX-E11.9 10/14/16   Unk Pinto, MD  influenza vac split quadrivalent PF (FLUARIX) 0.5 ML injection Inject 0.5 mLs into the muscle. 03/24/21   Carlyle Basques, MD  losartan (COZAAR) 50 MG tablet TAKE 1/2 TO 1 TABLET BY  MOUTH DAILY FOR BLOOD  PRESSURE 05/13/21   Magda Bernheim, NP  metFORMIN (GLUCOPHAGE-XR) 500 MG 24 hr tablet TAKE 2 TABLETS BY MOUTH  WITH BREAKFAST AND 2  TABLETS BY MOUTH WITH  DINNER FOR BLOOD SUGAR 04/20/21   Magda Bernheim, NP  Omega-3 Fatty Acids  (OMEGA 3 PO) Take 1 capsule by mouth 2 (two) times daily.    [provider]  pantoprazole (PROTONIX) 40 MG tablet TAKE 1 TABLET BY MOUTH  DAILY TO PREVENT HEARTBURN  AND INDIGESTION 05/13/21   Magda Bernheim, NP  rosuvastatin (CRESTOR) 5 MG tablet TAKE 1 TABLET BY MOUTH EVERY DAY 06/12/21   Liane Comber, NP  prochlorperazine (COMPAZINE) 10 MG tablet Take 1 tablet (10 mg total) by mouth every 6 (six) hours as needed (Nausea or vomiting). 01/14/20 04/30/20  Magrinat, Virgie Dad, MD      Allergies    Pecan nut (diagnostic), Hydromorphone, and Other    Review of Systems   Review of Systems  Constitutional:  Negative for chills, diaphoresis, fatigue and fever.  HENT:  Positive for ear pain. Negative for congestion, facial swelling, rhinorrhea and sneezing.   Eyes: Negative.   Respiratory:  Negative for cough, chest tightness and shortness of breath.   Cardiovascular:  Negative for chest pain and leg swelling.  Gastrointestinal:  Negative for abdominal pain, blood in stool, diarrhea, nausea and vomiting.  Genitourinary:  Negative for difficulty urinating, flank pain, frequency and hematuria.  Musculoskeletal:  Negative for arthralgias and back pain.  Skin:  Negative for rash.  Neurological:  Negative for dizziness, speech difficulty,  weakness, numbness and headaches.   Physical Exam Updated Vital Signs BP (!) 142/84 (BP Location: Left Arm)    Pulse 98    Temp 99.7 F (37.6 C)    Resp 16    Ht 5\' 2"  (1.575 m)    Wt 95.3 kg    SpO2 97%    BMI 38.41 kg/m  Physical Exam Constitutional:      Appearance: She is well-developed.  HENT:     Head: Normocephalic and atraumatic.     Comments: No pain over sinuses.    Left Ear: Tympanic membrane normal.     Ears:     Comments: No current pain to her ear.  No pain around the mastoid.  No swelling.  No warmth or erythema.  The canal and the TM appear normal.  She has normal sensation to her face.  No facial drooping. Eyes:     Extraocular  Movements: Extraocular movements intact.     Pupils: Pupils are equal, round, and reactive to light.  Cardiovascular:     Rate and Rhythm: Normal rate and regular rhythm.     Heart sounds: Normal heart sounds.  Pulmonary:     Effort: Pulmonary effort is normal. No respiratory distress.     Breath sounds: Normal breath sounds. No wheezing or rales.  Chest:     Chest wall: No tenderness.  Abdominal:     General: Bowel sounds are normal.     Palpations: Abdomen is soft.     Tenderness: There is no abdominal tenderness. There is no guarding or rebound.  Musculoskeletal:        General: Normal range of motion.     Cervical back: Normal range of motion and neck supple.  Lymphadenopathy:     Cervical: No cervical adenopathy.  Skin:    General: Skin is warm and dry.     Findings: No rash.  Neurological:     Mental Status: She is alert and oriented to person, place, and time.     Comments: Motor 5/5 all extremities Sensation grossly intact to LT all extremities Finger to Nose intact, no pronator drift CN II-XII grossly intact      ED Results / Procedures / Treatments   Labs (all labs ordered are listed, but only abnormal results are displayed) Labs Reviewed - No data to display  EKG None  Radiology No results found.  Procedures Procedures    Medications Ordered in ED Medications - No data to display  ED Course/ Medical Decision Making/ A&P                           Medical Decision Making Amount and/or Complexity of Data Reviewed External Data Reviewed: notes.   Patient presents with some shooting pains behind her left ear.  She is currently asymptomatic.  She has no pain on palpation of her mastoid or anywhere around the ear.  There is no swelling or signs of infection.  No neurologic deficits.  No suggestions of a stroke.  She has no headache or any other suggestions of intracranial mass or hemorrhage.  She has no hearing loss.  At this point since she is  asymptomatic without any other suggestions of concerns, we will go ahead and plan on discharge with follow-up with her PCP.  She was encouraged to follow-up within the next few days.  Return precautions were given.  Final Clinical Impression(s) / ED Diagnoses Final diagnoses:  Otalgia of left  ear    Rx / DC Orders ED Discharge Orders     None         Malvin Johns, MD 08/01/21 [phone]

## 2021-08-01 NOTE — Discharge Instructions (Signed)
Follow-up with your primary care doctor for recheck.  Return to the emergency room if you have any worsening symptoms.

## 2021-08-27 ENCOUNTER — Encounter: Payer: Self-pay | Admitting: Oncology

## 2021-09-03 ENCOUNTER — Encounter: Payer: 59 | Admitting: Nurse Practitioner

## 2021-10-26 ENCOUNTER — Other Ambulatory Visit: Payer: Self-pay

## 2021-10-26 DIAGNOSIS — Z170 Estrogen receptor positive status [ER+]: Secondary | ICD-10-CM

## 2021-10-27 ENCOUNTER — Other Ambulatory Visit: Payer: Self-pay

## 2021-10-27 ENCOUNTER — Inpatient Hospital Stay: Payer: Medicare Other

## 2021-10-27 ENCOUNTER — Inpatient Hospital Stay: Payer: Medicare Other | Attending: Hematology and Oncology | Admitting: Hematology and Oncology

## 2021-10-27 ENCOUNTER — Encounter: Payer: Self-pay | Admitting: Hematology and Oncology

## 2021-10-27 VITALS — BP 118/67 | HR 89 | Temp 97.9°F | Resp 16 | Ht 62.0 in | Wt 214.5 lb

## 2021-10-27 DIAGNOSIS — C50411 Malignant neoplasm of upper-outer quadrant of right female breast: Secondary | ICD-10-CM | POA: Diagnosis not present

## 2021-10-27 DIAGNOSIS — Z79811 Long term (current) use of aromatase inhibitors: Secondary | ICD-10-CM | POA: Diagnosis not present

## 2021-10-27 DIAGNOSIS — Z801 Family history of malignant neoplasm of trachea, bronchus and lung: Secondary | ICD-10-CM | POA: Diagnosis not present

## 2021-10-27 DIAGNOSIS — Z803 Family history of malignant neoplasm of breast: Secondary | ICD-10-CM | POA: Insufficient documentation

## 2021-10-27 DIAGNOSIS — Z170 Estrogen receptor positive status [ER+]: Secondary | ICD-10-CM

## 2021-10-27 DIAGNOSIS — I129 Hypertensive chronic kidney disease with stage 1 through stage 4 chronic kidney disease, or unspecified chronic kidney disease: Secondary | ICD-10-CM | POA: Insufficient documentation

## 2021-10-27 DIAGNOSIS — Z808 Family history of malignant neoplasm of other organs or systems: Secondary | ICD-10-CM | POA: Diagnosis not present

## 2021-10-27 DIAGNOSIS — M8580 Other specified disorders of bone density and structure, unspecified site: Secondary | ICD-10-CM | POA: Diagnosis present

## 2021-10-27 DIAGNOSIS — N189 Chronic kidney disease, unspecified: Secondary | ICD-10-CM | POA: Diagnosis not present

## 2021-10-27 DIAGNOSIS — Z807 Family history of other malignant neoplasms of lymphoid, hematopoietic and related tissues: Secondary | ICD-10-CM | POA: Diagnosis not present

## 2021-10-27 DIAGNOSIS — Z853 Personal history of malignant neoplasm of breast: Secondary | ICD-10-CM | POA: Insufficient documentation

## 2021-10-27 LAB — CMP (CANCER CENTER ONLY)
ALT: 15 U/L (ref 0–44)
AST: 14 U/L — ABNORMAL LOW (ref 15–41)
Albumin: 4.2 g/dL (ref 3.5–5.0)
Alkaline Phosphatase: 75 U/L (ref 38–126)
Anion gap: 6 (ref 5–15)
BUN: 21 mg/dL (ref 8–23)
CO2: 31 mmol/L (ref 22–32)
Calcium: 9.3 mg/dL (ref 8.9–10.3)
Chloride: 102 mmol/L (ref 98–111)
Creatinine: 1.05 mg/dL — ABNORMAL HIGH (ref 0.44–1.00)
GFR, Estimated: 59 mL/min — ABNORMAL LOW (ref >60)
Glucose, Bld: 145 mg/dL — ABNORMAL HIGH (ref 70–99)
Potassium: 3.8 mmol/L (ref 3.5–5.1)
Sodium: 139 mmol/L (ref 135–145)
Total Bilirubin: 0.6 mg/dL (ref 0.3–1.2)
Total Protein: 7.7 g/dL (ref 6.5–8.1)

## 2021-10-27 LAB — CBC WITH DIFFERENTIAL (CANCER CENTER ONLY)
Abs Immature Granulocytes: 0.01 10*3/uL (ref 0.00–0.07)
Basophils Absolute: 0 10*3/uL (ref 0.0–0.1)
Basophils Relative: 0 %
Eosinophils Absolute: 0.2 10*3/uL (ref 0.0–0.5)
Eosinophils Relative: 3 %
HCT: 39.6 % (ref 36.0–46.0)
Hemoglobin: 13.4 g/dL (ref 12.0–15.0)
Immature Granulocytes: 0 %
Lymphocytes Relative: 22 %
Lymphs Abs: 1.9 10*3/uL (ref 0.7–4.0)
MCH: 31 pg (ref 26.0–34.0)
MCHC: 33.8 g/dL (ref 30.0–36.0)
MCV: 91.7 fL (ref 80.0–100.0)
Monocytes Absolute: 0.9 10*3/uL (ref 0.1–1.0)
Monocytes Relative: 10 %
Neutro Abs: 5.5 10*3/uL (ref 1.7–7.7)
Neutrophils Relative %: 65 %
Platelet Count: 263 10*3/uL (ref 150–400)
RBC: 4.32 MIL/uL (ref 3.87–5.11)
RDW: 14 % (ref 11.5–15.5)
WBC Count: 8.6 10*3/uL (ref 4.0–10.5)
nRBC: 0 % (ref 0.0–0.2)

## 2021-10-27 NOTE — Progress Notes (Signed)
[place]  [phone] [phone]     ID: DALISA FORRER DOB: Sep 27, 1955  MR#: [id number]  ZDG#:[id number]  Patient Care Team: Jonathon Jordan, MD as PCP - General (Family Medicine) [name], RN as Oncology Nurse Navigator [name], RN as Oncology Nurse Navigator Erroll Luna, MD as Consulting Physician (General Surgery) Magrinat, Virgie Dad, MD (Inactive) as Consulting Physician (Oncology) Eppie Gibson, MD as Attending Physician (Radiation Oncology) Mcarthur Rossetti, MD as Consulting Physician (Orthopedic Surgery) Regal, Tamala Fothergill, DPM as Consulting Physician (Podiatry) Larey Dresser, MD as Consulting Physician (Cardiology) Benay Pike, MD OTHER MD:  CHIEF COMPLAINT: triple positive breast cancer (s/p bilateral mastectomies)  CURRENT TREATMENT: anastrozole  INTERVAL HISTORY:  Natalia returns today for follow up of her triple positive breast cancer.  She continues on anastrozole with good tolerance. She had some arthralgias but these have resolved She hasnt noted any breast changes, declined reconstruction. D[facility] scan 06/25/2020 [facility] showed a T score of -1.1. which is minimally osteopenic She denies any changes in her breathing, bowel habits or urinary habits. Rest of the pertinent 10 point ROS reviewed and negative.  REVIEW OF SYSTEMS:  COVID 19 VACCINATION STATUS: fully vaccinated [employer]), with booster 05/2020; also had COVID in 2020   HISTORY OF CURRENT ILLNESS: From the original intake note:  HARLEEN FINEBERG had routine screening mammography on 05/01/2020 showing a possible abnormality in the right breast. She underwent right diagnostic mammography with tomography at [facility] on 05/09/2019 showing: breast density category A; probably-benign regional calcifications in the right breast at 11 o'clock. Short term follow up was recommended.   She returned for follow up at [facility] on 11/13/2019. She  underwent right diagnostic mammography and right breast ultrasonography showing: breast composition A; indeterminate calcifications in upper-outer right breast spanning 5 cm; 0.8 cm irregular mass in right breast at 9 o'clock.  Accordingly on 11/20/2019 she proceeded to biopsy of the right breast areas in question. The pathology from this procedure ([id number]) showed:  1. Right breast, UOQ  - foci of invasive and in situ mammary carcinoma, grade 2-3, e-cadherin positive.  2. Right breast, 9 o'clock  - invasive mammary carcinoma, grade 2-3, e-cadherin positive  - Prognostic indicators significant for: estrogen receptor, 95% positive and progesterone receptor, 25% positive, both with strong staining intensity. Proliferation marker Ki67 at 20%. HER2 equivocal by immunohistochemistry (2+), but positive by fluorescent in situ hybridization with a signals ratio 3.30 and number per cell 7.25.  The patient's subsequent history is as detailed below.   PAST MEDICAL HISTORY: Past Medical History:  Diagnosis Date   Allergy    Anemia    Arthritis    Cancer ([place])    right breast cancer   Cataracts, bilateral    Chronic kidney disease    CKD   Family history of breast cancer    Family history of multiple myeloma    Fatty liver disease, nonalcoholic 4/33/2951   [place] appears normal on follow up US 09/2019   GERD (gastroesophageal reflux disease)    History of kidney stones    x2 episodes   Hyperlipidemia    Hypertension    PONV (postoperative nausea and vomiting)    Sleep apnea    test was dx, as mild- no further tx. required   Type II or unspecified type diabetes mellitus without mention of complication, not stated as uncontrolled     PAST SURGICAL HISTORY: Past Surgical History:  Procedure Laterality Date   BREAST  SURGERY  1999   Reduction Mammoplasties   CHOLECYSTECTOMY  2003   colonoscopsy  11/2007   neg- polyp removal with both procedures x2   CYSTOSCOPY     surgery to extract stone x  1 with stent   EUS N/A 07/26/2014   Procedure: UPPER ENDOSCOPIC ULTRASOUND (EUS) LINEAR;  Surgeon: Beryle Beams, MD;  Location: [facility];  Service: Endoscopy;  Laterality: N/A;   LAPAROTOMY N/A 10/10/2014   Procedure: 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;  Surgeon: [name], MD;  Location: [facility];  Service: General;  Laterality: N/A;   LYSIS OF ADHESION  10/10/2014   Procedure: LYSIS OF ADHESION;  Surgeon: [name], MD;  Location: [facility];  Service: General;;   MASTECTOMY W/ SENTINEL NODE BIOPSY Bilateral 01/02/2020   Procedure: BILATERAL MASTECTOMY WITH RIGHT SENTINEL LYMPH NODE MAPPING;  Surgeon: Erroll Luna, MD;  Location: [place];  Service: General;  Laterality: Bilateral;  PEC BLOCK   PORTACATH PLACEMENT Right 01/02/2020   Procedure: INSERTION PORT-A-CATH WITH ULTRASOUND GUIDANCE;  Surgeon: Erroll Luna, MD;  Location: [place];  Service: General;  Laterality: Right;   TONSILLECTOMY     and adenoids removed age 41   VAGINAL HYSTERECTOMY  2002    FAMILY HISTORY: Family History  Problem Relation Age of Onset   Breast cancer Mother 104   Heart disease Father        smoker   Diabetes type I Half-Sister    Alzheimer's disease Maternal Grandmother    Emphysema Paternal Grandfather        smoker   Heart attack Paternal Grandfather    Lung cancer Paternal Grandfather    CAD Brother 58       stents   Breast cancer Maternal Aunt        dx. in her late 32s   Breast cancer Cousin 19   Cancer Maternal Uncle        dx. in his 72s; tumor on the back of neck   Breast cancer Paternal Aunt        dx. >50   Multiple myeloma Maternal Aunt 92   Multiple myeloma Paternal Aunt        dx. >50   Breast cancer Cousin 25   Her father died at age 65. Her mother died at age 13. She has 2 brothers and had 1 sister who is deceased. She reports breast cancer on her maternal side-- her mother at age 56, an aunt, and a cousin.   GYNECOLOGIC HISTORY:  No LMP  recorded. Patient has had a hysterectomy. Menarche: 66 years old Age at first live birth: 66 years old Arma P 2 LMP unsure Contraceptive: never used HRT never used  Hysterectomy? Yes, 2002 BSO? no  SOCIAL HISTORY: (updated 11/2019)  Kaloni is currently working for [employer]. Husband Denyse Amass works for [employer], a Passenger transport manager. Son Katherine Basset, age 3, is self-employed in [place], [place]. Son Halawa, age 61, lives here in [place].     ADVANCED DIRECTIVES: In the absence of any documentation to the contrary, the patient's spouse is their HCPOA.    HEALTH MAINTENANCE: Social History   Tobacco Use   Smoking status: Never   Smokeless tobacco: Never  Vaping Use   Vaping Use: Never used  Substance Use Topics   Alcohol use: No   Drug use: No     Colonoscopy: 02/2019 (Dr. Collene Mares), repeat due 2025  PAP: none on file (s/p hysterectomy)  Bone density: never done   Allergies  Allergen Reactions   Pecan Nut (Diagnostic)     Other reaction(s): Unknown   Hydromorphone Rash    Arm reddened at IV med site   Other Itching and Rash    NUTS CAUSE MOUTH TO [facility]    Current Outpatient Medications  Medication Sig Dispense Refill   anastrozole (ARIMIDEX) 1 MG tablet Take 1 tablet (1 mg total) by mouth daily. 90 tablet 4   aspirin EC 81 MG tablet Take 81 mg by mouth daily. Swallow whole.     cetirizine (ZYRTEC) 10 MG tablet Take 10 mg by mouth daily.     Cholecalciferol (VITAMIN D3) 5000 UNITS TABS Take 5,000 Units by mouth daily.      glucose blood (CONTOUR NEXT TEST) test strip Check blood sugar 1 time daily-DX-E11.9 100 each 12   influenza vac split quadrivalent PF (FLUARIX) 0.5 ML injection Inject 0.5 mLs into the muscle. 0.5 mL 0   losartan (COZAAR) 50 MG tablet TAKE 1/2 TO 1 TABLET BY  MOUTH DAILY FOR BLOOD  PRESSURE 90 tablet 3   metFORMIN (GLUCOPHAGE-XR) 500 MG 24 hr tablet TAKE 2 TABLETS BY MOUTH  WITH BREAKFAST AND 2  TABLETS BY MOUTH WITH  DINNER FOR BLOOD SUGAR  360 tablet 3   Omega-3 Fatty Acids (OMEGA 3 PO) Take 1 capsule by mouth 2 (two) times daily.     pantoprazole (PROTONIX) 40 MG tablet TAKE 1 TABLET BY MOUTH  DAILY TO PREVENT HEARTBURN  AND INDIGESTION 90 tablet 3   rosuvastatin (CRESTOR) 5 MG tablet TAKE 1 TABLET BY MOUTH EVERY DAY 90 tablet 1   No current facility-administered medications for this visit.    OBJECTIVE: African-American woman using a cane  Vitals:   10/27/21 1424  BP: 118/67  Pulse: 89  Resp: 16  Temp: 97.9 F (36.6 C)  SpO2: 97%      Body mass index is 39.23 kg/m.   Wt Readings from Last 3 Encounters:  10/27/21 214 lb 8 oz (97.3 kg)  08/01/21 210 lb (95.3 kg)  04/27/21 212 lb 9.6 oz (96.4 kg)     ECOG FS:1 - Symptomatic but completely ambulatory  Physical Exam Constitutional:      Appearance: Normal appearance.  Cardiovascular:     Rate and Rhythm: Normal rate and regular rhythm.     Pulses: Normal pulses.     Heart sounds: Normal heart sounds.  Pulmonary:     Effort: Pulmonary effort is normal.     Breath sounds: Normal breath sounds.  Chest:     Comments: Bilateral mastectomy.  No concern for recurrence.  No palpable regional adenopathy Musculoskeletal:        General: No swelling or tenderness.     Cervical back: Normal range of motion and neck supple. No rigidity.  Lymphadenopathy:     Cervical: No cervical adenopathy.  Skin:    General: Skin is warm and dry.  Neurological:     General: No focal deficit present.     Mental Status: She is alert.    LAB RESULTS:  CMP     Component Value Date/Time   NA 140 04/27/2021 1513   K 3.5 04/27/2021 1513   CL 104 04/27/2021 1513   CO2 26 04/27/2021 1513   GLUCOSE 284 (H) 04/27/2021 1513   BUN 12 04/27/2021 1513   CREATININE 1.07 (H) 04/27/2021 1513   CREATININE 0.95 09/03/2020 1558   CALCIUM 8.8 (L) 04/27/2021 1513   PROT 6.8  04/27/2021 1513   ALBUMIN 3.5 04/27/2021 1513   AST 10 (L) 04/27/2021 1513   ALT 14 04/27/2021 1513   ALKPHOS 65  04/27/2021 1513   BILITOT 0.3 04/27/2021 1513   GFRNONAA 58 (L) 04/27/2021 1513   GFRNONAA 63 09/03/2020 1558   GFRAA 73 09/03/2020 1558    Lab Results  Component Value Date   ALBUMINELP 3.9 08/23/2019   A1GS 0.4 (H) 08/23/2019   A2GS 0.9 08/23/2019   BETS 0.5 08/23/2019   BETA2SER 0.4 08/23/2019   GAMS 0.9 08/23/2019   SPEI  08/23/2019     Comment:     . Alpha-1 globulin increase noted. .     Lab Results  Component Value Date   WBC 8.6 10/27/2021   NEUTROABS 5.5 10/27/2021   HGB 13.4 10/27/2021   HCT 39.6 10/27/2021   MCV 91.7 10/27/2021   PLT 263 10/27/2021    No results found for: LABCA2  No components found for: GHWEXH371  No results for input(s): INR in the last 168 hours.  No results found for: LABCA2  No results found for: IRC789  No results found for: FYB017  No results found for: PZW258  No results found for: CA2729  No components found for: HGQUANT  No results found for: CEA1 / No results found for: CEA1   No results found for: AFPTUMOR  No results found for: CHROMOGRNA  No results found for: KPAFRELGTCHN, LAMBDASER, KAPLAMBRATIO (kappa/lambda light chains)  No results found for: HGBA, HGBA2QUANT, HGBFQUANT, HGBSQUAN (Hemoglobinopathy evaluation)   No results found for: LDH  Lab Results  Component Value Date   IRON 54 11/26/2019   TIBC 378 11/26/2019   IRONPCTSAT 14 (L) 11/26/2019   (Iron and TIBC)  Lab Results  Component Value Date   FERRITIN 91 05/21/2015    Urinalysis    Component Value Date/Time   COLORURINE YELLOW 09/03/2020 1558   APPEARANCEUR CLEAR 09/03/2020 1558   LABSPEC 1.003 09/03/2020 1558   PHURINE 6.5 09/03/2020 1558   GLUCOSEU NEGATIVE 09/03/2020 1558   HGBUR NEGATIVE 09/03/2020 1558   BILIRUBINUR NEG 11/22/2013 1031   BILIRUBINUR neg 10/28/2013 [place] 09/03/2020 1558   PROTEINUR NEGATIVE 09/03/2020 1558   UROBILINOGEN 0.2 11/22/2013 1031   NITRITE NEGATIVE 08/13/2019 1359    LEUKOCYTESUR 1+ (A) 08/13/2019 1359    STUDIES: No results found.   ELIGIBLE FOR AVAILABLE RESEARCH PROTOCOL: AET  ASSESSMENT: 66 y.o. [place] woman status post right breast upper outer quadrant biopsy 11/20/2019 for a clinical mT1b N0, stage IA invasive ductal carcinoma, grade 2 or 3, estrogen and progesterone receptor positive, HER-2 amplified, with an MIB-1 of 20%  (1) status post bilateral mastectomies 01/02/2020 showing  (a) on the right, an mpT2 pN0(i+), stage IB invasive ductal carcinoma, triple positive, with close but negative margins; 2 sentinel lymph nodes removed  (b) on the left, no malignancy identified  (c) the patient is not planning on reconstruction  (2) adjuvant chemo immunotherapy consisting of Abraxane weekly x12 with trastuzumab started 02/04/2020 completed 04/21/2020  (a) echo 12/13/2019 shows an ejection fraction in the 55-60% range  (b) echo 04/03/2020 shows an ejection fraction in the 55-60% range.  (c) echos 07/22/2020 and 10/14/2020 show no change in EF  (3) trastuzumab continued to total 1 year (last dose 01/20/2021)  (4) adjuvant radiation 05/14/2020 through 06/30/2020 [facility] Total Dose (Gy) Dose per Fx (Gy) Completed Fx Beam Energies  Chest Wall, Right: CW_Rt 3D 50/50 2 25/25 10X, 15X  Chest Wall,  Right: CW_Rt_PAB_SCV 3D 45/45 1.8 25/25 10X, 15X  Chest Wall, Right: CW_Rt_Bst Electron 10/10 2 5/5 6E   (5) genetics testing 12/16/2019 through the [facility] Breast Cancer STAT panel + Common Hereditary Cancers panel found no deleterious mutations in ATM, BRCA1, BRCA2, CDH1, CHEK2, PALB2, PTEN, STK11 and TP53. The Common Hereditary Cancers Panel offered by [facility] includes sequencing and/or deletion duplication testing of the following 48 genes: APC, ATM, AXIN2, BARD1, BMPR1A, BRCA1, BRCA2, BRIP1, CDH1, CDK4, CDKN2A (p14ARF), CDKN2A (p16INK4a), CHEK2, CTNNA1, DICER1, EPCAM (Deletion/duplication testing only), GREM1 (promoter region deletion/duplication  testing only), KIT, MEN1, MLH1, MSH2, MSH3, MSH6, MUTYH, NBN, NF1, NTHL1, PALB2, PDGFRA, PMS2, POLD1, POLE, PTEN, RAD50, RAD51C, RAD51D, RNF43, SDHB, SDHC, SDHD, SMAD4, SMARCA4. STK11, TP53, TSC1, TSC2, and VHL.  The following genes were evaluated for sequence changes only: SDHA and HOXB13 c.251G>A variant only.  (a) Two variants of uncertain significance (VUS) were detected - one in the BRIP1 gene called c.1735C>T and a second in the POLD1 gene called c.2861C>G.   (6) anastrozole started 10/27/2020  (a) bone density at [facility] 06/25/2020 shows a T score of -1.1  PLAN:  Shawntelle is about two years from definitive surgery for her breast cancer with no evidence of disease recurrence.  She is here for follow-up on anastrozole. She has been tolerating it very well. NO other concerns today Physical examination today without any concerns for recurrence.  No role for mammogram since she had bilateral mastectomies. She will return to clinic in 6 months for follow-up. From bone density standpoint, she has minimal osteopenia, encourage calcium as tolerated, vitamin D supplements and weightbearing exercises. Total time spent: 30 minutes patient new patient to me, transitioning from Dr. Jana Hakim upon his retirement  *Total Encounter Time as defined by the Centers for Medicare and Medicaid Services includes, in addition to the face-to-face time of a patient visit (documented in the note above) non-face-to-face time: obtaining and reviewing outside history, ordering and reviewing medications, tests or procedures, care coordination (communications with other health care professionals or caregivers) and documentation in the medical record.

## 2021-11-10 ENCOUNTER — Other Ambulatory Visit: Payer: Self-pay | Admitting: Oncology

## 2021-11-19 ENCOUNTER — Encounter: Payer: Self-pay | Admitting: Family Medicine

## 2021-11-19 ENCOUNTER — Emergency Department (HOSPITAL_BASED_OUTPATIENT_CLINIC_OR_DEPARTMENT_OTHER)
Admission: EM | Admit: 2021-11-19 | Discharge: 2021-11-19 | Disposition: A | Discharge status: Home / Self Care | Payer: Medicare Other | Attending: Emergency Medicine | Admitting: Emergency Medicine

## 2021-11-19 ENCOUNTER — Encounter (HOSPITAL_BASED_OUTPATIENT_CLINIC_OR_DEPARTMENT_OTHER): Payer: Self-pay | Admitting: Emergency Medicine

## 2021-11-19 ENCOUNTER — Emergency Department (HOSPITAL_BASED_OUTPATIENT_CLINIC_OR_DEPARTMENT_OTHER): Payer: Medicare Other | Admitting: Radiology

## 2021-11-19 DIAGNOSIS — R059 Cough, unspecified: Secondary | ICD-10-CM | POA: Diagnosis present

## 2021-11-19 DIAGNOSIS — J019 Acute sinusitis, unspecified: Secondary | ICD-10-CM | POA: Insufficient documentation

## 2021-11-19 DIAGNOSIS — N189 Chronic kidney disease, unspecified: Secondary | ICD-10-CM | POA: Diagnosis not present

## 2021-11-19 DIAGNOSIS — Z79899 Other long term (current) drug therapy: Secondary | ICD-10-CM | POA: Insufficient documentation

## 2021-11-19 DIAGNOSIS — Z7982 Long term (current) use of aspirin: Secondary | ICD-10-CM | POA: Insufficient documentation

## 2021-11-19 DIAGNOSIS — Z7984 Long term (current) use of oral hypoglycemic drugs: Secondary | ICD-10-CM | POA: Diagnosis not present

## 2021-11-19 DIAGNOSIS — R002 Palpitations: Secondary | ICD-10-CM | POA: Diagnosis not present

## 2021-11-19 DIAGNOSIS — J329 Chronic sinusitis, unspecified: Secondary | ICD-10-CM

## 2021-11-19 DIAGNOSIS — Z853 Personal history of malignant neoplasm of breast: Secondary | ICD-10-CM | POA: Insufficient documentation

## 2021-11-19 DIAGNOSIS — E1122 Type 2 diabetes mellitus with diabetic chronic kidney disease: Secondary | ICD-10-CM | POA: Insufficient documentation

## 2021-11-19 DIAGNOSIS — I129 Hypertensive chronic kidney disease with stage 1 through stage 4 chronic kidney disease, or unspecified chronic kidney disease: Secondary | ICD-10-CM | POA: Insufficient documentation

## 2021-11-19 DIAGNOSIS — E119 Type 2 diabetes mellitus without complications: Secondary | ICD-10-CM | POA: Diagnosis not present

## 2021-11-19 LAB — CBC
HCT: 40.3 % (ref 36.0–46.0)
Hemoglobin: 13.3 g/dL (ref 12.0–15.0)
MCH: 30.4 pg (ref 26.0–34.0)
MCHC: 33 g/dL (ref 30.0–36.0)
MCV: 92.2 fL (ref 80.0–100.0)
Platelets: 254 10*3/uL (ref 150–400)
RBC: 4.37 MIL/uL (ref 3.87–5.11)
RDW: 13.7 % (ref 11.5–15.5)
WBC: 8.1 10*3/uL (ref 4.0–10.5)
nRBC: 0 % (ref 0.0–0.2)

## 2021-11-19 LAB — TROPONIN I (HIGH SENSITIVITY)
Troponin I (High Sensitivity): 2 ng/L (ref <18)
Troponin I (High Sensitivity): 3 ng/L (ref <18)

## 2021-11-19 LAB — BASIC METABOLIC PANEL
Anion gap: 10 (ref 5–15)
BUN: 13 mg/dL (ref 8–23)
CO2: 27 mmol/L (ref 22–32)
Calcium: 9.9 mg/dL (ref 8.9–10.3)
Chloride: 102 mmol/L (ref 98–111)
Creatinine, Ser: 1.01 mg/dL — ABNORMAL HIGH (ref 0.44–1.00)
GFR, Estimated: >60 mL/min (ref >60)
Glucose, Bld: 142 mg/dL — ABNORMAL HIGH (ref 70–99)
Potassium: 3.9 mmol/L (ref 3.5–5.1)
Sodium: 139 mmol/L (ref 135–145)

## 2021-11-19 MED ORDER — AMOXICILLIN-POT CLAVULANATE 875-125 MG PO TABS: 1.0000 | ORAL_TABLET | Freq: Two times a day (BID) | ORAL | 0 refills | Status: DC

## 2021-11-19 MED ORDER — BENZONATATE 100 MG PO CAPS: 100.0000 mg | ORAL_CAPSULE | Freq: Three times a day (TID) | ORAL | 0 refills | Status: DC

## 2021-11-19 NOTE — ED Provider Notes (Signed)
[facility] EMERGENCY DEPT Provider Note   CSN: [id number] Arrival date & time: 11/19/21  1323     History  Chief Complaint  Patient presents with   Chest Pain   Cough    Tracy Garrett is a 66 y.o. female.  66 year old female presents today for evaluation of swelling around the ankles, cough which has been going on for about a month.  This is a dry cough.  And a fluttering sensation in her chest which she has noticed since last week.  She is unsure of activities that bring on the fluttering sensation.  She states this is transient.  Not associated with lightheadedness, chest pain, shortness of breath, or nausea.  She does have history of breast cancer.  Currently being treated for diabetes, and hypertension.  Denies other complaints.  She states the swelling is noticeable after her work today is over, and resolves towards the end of the day after she is been relaxing or by the morning.  Denies recent flight, road trip, or prolonged period of inactivity, does not take exogenous estrogen.  Currently without chest pain, or shortness of breath.  Without fluttering sensation.  Does endorse sinus congestion which has also been present for about a month consistent with the timeframe of the cough.  She endorses postnasal drainage.  The history is provided by the patient. No language interpreter was used.       Home Medications Prior to Admission medications   Medication Sig Start Date End Date Taking? Authorizing Provider  anastrozole (ARIMIDEX) 1 MG tablet Take 1 tablet (1 mg total) by mouth daily. 04/27/21   Magrinat, Virgie Dad, MD  aspirin EC 81 MG tablet Take 81 mg by mouth daily. Swallow whole.    [provider]  cetirizine (ZYRTEC) 10 MG tablet Take 10 mg by mouth daily.    [provider]  Cholecalciferol (VITAMIN D3) 5000 UNITS TABS Take 5,000 Units by mouth daily.     [provider]  glucose blood (CONTOUR NEXT TEST) test strip Check  blood sugar 1 time daily-DX-E11.9 10/14/16   Unk Pinto, MD  influenza vac split quadrivalent PF (FLUARIX) 0.5 ML injection Inject 0.5 mLs into the muscle. 03/24/21   Carlyle Basques, MD  losartan (COZAAR) 50 MG tablet TAKE 1/2 TO 1 TABLET BY  MOUTH DAILY FOR BLOOD  PRESSURE 05/13/21   Alycia Rossetti, NP  metFORMIN (GLUCOPHAGE-XR) 500 MG 24 hr tablet TAKE 2 TABLETS BY MOUTH  WITH BREAKFAST AND 2  TABLETS BY MOUTH WITH  DINNER FOR BLOOD SUGAR 04/20/21   Alycia Rossetti, NP  Omega-3 Fatty Acids (OMEGA 3 PO) Take 1 capsule by mouth 2 (two) times daily.    [provider]  pantoprazole (PROTONIX) 40 MG tablet TAKE 1 TABLET BY MOUTH  DAILY TO PREVENT HEARTBURN  AND INDIGESTION 05/13/21   Alycia Rossetti, NP  rosuvastatin (CRESTOR) 5 MG tablet TAKE 1 TABLET BY MOUTH EVERY DAY 06/12/21   Liane Comber, NP  prochlorperazine (COMPAZINE) 10 MG tablet Take 1 tablet (10 mg total) by mouth every 6 (six) hours as needed (Nausea or vomiting). 01/14/20 04/30/20  Magrinat, Virgie Dad, MD      Allergies    Pecan nut (diagnostic), Hydromorphone, and Other    Review of Systems   Review of Systems  Constitutional:  Negative for chills and fever.  Cardiovascular:  Positive for leg swelling. Negative for chest pain and palpitations.  Gastrointestinal:  Negative for abdominal pain, nausea and vomiting.  Neurological:  Negative for weakness, light-headedness and numbness.  All other systems reviewed and are negative.   Physical Exam Updated Vital Signs BP 104/74 (BP Location: Left Arm)   Pulse 87   Temp (!) 97.4 F (36.3 C)   Resp 18   Ht '5\' 2"'  (1.575 m)   Wt 96.2 kg   SpO2 100%   BMI 38.78 kg/m  Physical Exam Vitals and nursing note reviewed.  Constitutional:      General: She is not in acute distress.    Appearance: Normal appearance. She is not ill-appearing.  HENT:     Head: Normocephalic and atraumatic.     Nose: Nose normal.  Eyes:     General: No scleral icterus.     Extraocular Movements: Extraocular movements intact.     Conjunctiva/sclera: Conjunctivae normal.  Cardiovascular:     Rate and Rhythm: Normal rate and regular rhythm.     Pulses: Normal pulses.  Pulmonary:     Effort: Pulmonary effort is normal. No respiratory distress.     Breath sounds: Normal breath sounds. No wheezing or rales.  Abdominal:     General: There is no distension.     Palpations: Abdomen is soft.     Tenderness: There is no abdominal tenderness.  Musculoskeletal:        General: Normal range of motion.     Cervical back: Normal range of motion.     Right lower leg: No edema.     Left lower leg: No edema.     Comments: Currently without any lower extremity edema.  Skin:    General: Skin is warm and dry.  Neurological:     General: No focal deficit present.     Mental Status: She is alert. Mental status is at baseline.     ED Results / Procedures / Treatments   Labs (all labs ordered are listed, but only abnormal results are displayed) Labs Reviewed  BASIC METABOLIC PANEL - Abnormal; Notable for the following components:      Result Value   Glucose, Bld 142 (*)    Creatinine, Ser 1.01 (*)    All other components within normal limits  CBC  TROPONIN I (HIGH SENSITIVITY)  TROPONIN I (HIGH SENSITIVITY)    EKG EKG Interpretation  Date/Time:  Thursday November 19 2021 14:01:56 EDT Ventricular Rate:  85 PR Interval:  146 QRS Duration: 82 QT Interval:  346 QTC Calculation: 411 R Axis:   37 Text Interpretation: Normal sinus rhythm Normal ECG When compared with ECG of 26-May-2010 06:12, No significant change was found Confirmed by Aletta Edouard [phone]) on 11/19/2021 2:05:06 PM  Radiology DG Chest 2 View  Result Date: 11/19/2021 CLINICAL DATA:  Chest pain. EXAM: CHEST - 2 VIEW COMPARISON:  January 02, 2020. FINDINGS: The heart size and mediastinal contours are within normal limits. Both lungs are clear. The visualized skeletal structures are unremarkable.  IMPRESSION: No active cardiopulmonary disease. Electronically Signed   By: Marijo Conception M.D.   On: 11/19/2021 14:39    Procedures Procedures    Medications Ordered in ED Medications - No data to display  ED Course/ Medical Decision Making/ A&P                           Medical Decision Making Risk Prescription drug management.   Medical Decision Making / ED Course   This patient presents to the ED for concern of palpitations, sinus congestion, cough, this  involves an extensive number of treatment options, and is a complaint that carries with it a high risk of complications and morbidity.  The differential diagnosis includes sinusitis, URI, pneumonia, PE, ACS, arrhythmia  MDM: 66 year old female with past medical history of breast cancer, diabetes, hypertension presents today for evaluation of palpitations for about 1 week duration without associated.  She is also had ongoing cough for about 1 month associated with sinus congestion, and postnasal drainage.  Denies fever, shortness of breath.  X-ray today shows no evidence of pneumonia.  ACS work-up was unremarkable troponins negative x2, EKG without acute ischemic changes or other arrhythmia.  Patient monitored on telemetry throughout her emergency room stay without evidence of arrhythmia.  CBC without leukocytosis or anemia.  BMP with glucose of 142 otherwise unremarkable.  We will treat patient for sinusitis given duration of symptoms.  No concerning cause of patient's chest pain or palpitations.  Patient reports waxing and waning swelling in lower extremities which has been ongoing for a while.  No evidence of DVT.  She is low risk for DVT or PE per Wells criteria.  She is PERC negative.  No swelling noted currently.  No signs of CHF.  Will provide patient with cardiology follow-up for palpitations.  Return precautions discussed.  Discussed follow-up with primary care provider.   Lab Tests: -I ordered, reviewed, and interpreted labs.    The pertinent results include:   Labs Reviewed  BASIC METABOLIC PANEL - Abnormal; Notable for the following components:      Result Value   Glucose, Bld 142 (*)    Creatinine, Ser 1.01 (*)    All other components within normal limits  CBC  TROPONIN I (HIGH SENSITIVITY)  TROPONIN I (HIGH SENSITIVITY)      EKG  EKG Interpretation  Date/Time:  Thursday November 19 2021 14:01:56 EDT Ventricular Rate:  85 PR Interval:  146 QRS Duration: 82 QT Interval:  346 QTC Calculation: 411 R Axis:   37 Text Interpretation: Normal sinus rhythm Normal ECG When compared with ECG of 26-May-2010 06:12, No significant change was found Confirmed by Aletta Edouard [phone]) on 11/19/2021 2:05:06 PM         Imaging Studies ordered: I ordered imaging studies including chest x-ray I independently visualized and interpreted imaging. I agree with the radiologist interpretation   Medicines ordered and prescription drug management: No orders of the defined types were placed in this encounter.   -I have reviewed the patients home medicines and have made adjustments as needed  Cardiac Monitoring: The patient was maintained on a cardiac monitor.  I personally viewed and interpreted the cardiac monitored which showed an underlying rhythm of: Normal sinus rhythm  Reevaluation: After the interventions noted above, I reevaluated the patient and found that they have :stayed the same Patient remained symptom-free throughout her emergency room stay.  Co morbidities that complicate the patient evaluation  Past Medical History:  Diagnosis Date   Allergy    Anemia    Arthritis    Cancer ([place])    right breast cancer   Cataracts, bilateral    Chronic kidney disease    CKD   Family history of breast cancer    Family history of multiple myeloma    Fatty liver disease, nonalcoholic 3/81/0175   [place] appears normal on follow up US 09/2019   GERD (gastroesophageal reflux disease)    History of kidney stones     x2 episodes   Hyperlipidemia    Hypertension  PONV (postoperative nausea and vomiting)    Sleep apnea    test was dx, as mild- no further tx. required   Type II or unspecified type diabetes mellitus without mention of complication, not stated as uncontrolled       Dispostion: Patient is stable for discharge.  Discharged in stable condition.  Return precautions discussed.  Patient voices understanding and is in agreement with plan.   Final Clinical Impression(s) / ED Diagnoses Final diagnoses:  Palpitations  Sinusitis, unspecified chronicity, unspecified location    Rx / DC Orders ED Discharge Orders          Ordered    Ambulatory referral to Cardiology       Comments: If you have not heard from the Cardiology office within the next 72 hours please call [phone].   11/19/21 2014    amoxicillin-clavulanate (AUGMENTIN) 875-125 MG tablet  Every 12 hours        11/19/21 2014    benzonatate (TESSALON) 100 MG capsule  Every 8 hours        11/19/21 2014              Evlyn Courier, PA-C 11/19/21 2015    Sherwood Gambler, MD 11/19/21 2128

## 2021-11-19 NOTE — ED Triage Notes (Signed)
Pt reported to registration that she was having ankle swelling. Upon entering triage, pt states she has had an uncomfortable feeling in her chest with cough for "awhile".

## 2021-11-19 NOTE — Discharge Instructions (Addendum)
Your work-up today was overall reassuring.  No signs of heart attack, pneumonia, or significant concern for blood clot.  We kept you in the heart monitor and no concerning rhythms were noted.  I will give you a referral to cardiologist for the fluttering sensations you are having since last week.  Given your cough for about a month along with sinus congestion I will give you Augmentin to treat you for sinusitis.  I do recommend you follow-up with your primary care doctor to be reevaluated.  The cardiology office should call to get your appointment scheduled if you do not hear from them by Tuesday please give them a call to get this appointment scheduled.

## 2021-12-18 ENCOUNTER — Ambulatory Visit (INDEPENDENT_AMBULATORY_CARE_PROVIDER_SITE_OTHER): Payer: Medicare Other | Admitting: Cardiology

## 2021-12-18 ENCOUNTER — Encounter (HOSPITAL_BASED_OUTPATIENT_CLINIC_OR_DEPARTMENT_OTHER): Payer: Self-pay | Admitting: Cardiology

## 2021-12-18 VITALS — BP 115/60 | HR 89 | Ht 62.0 in | Wt 216.2 lb

## 2021-12-18 DIAGNOSIS — R011 Cardiac murmur, unspecified: Secondary | ICD-10-CM

## 2021-12-18 DIAGNOSIS — I10 Essential (primary) hypertension: Secondary | ICD-10-CM | POA: Diagnosis not present

## 2021-12-18 DIAGNOSIS — Z7189 Other specified counseling: Secondary | ICD-10-CM

## 2021-12-18 DIAGNOSIS — E1169 Type 2 diabetes mellitus with other specified complication: Secondary | ICD-10-CM

## 2021-12-18 DIAGNOSIS — E669 Obesity, unspecified: Secondary | ICD-10-CM | POA: Diagnosis not present

## 2021-12-18 DIAGNOSIS — R079 Chest pain, unspecified: Secondary | ICD-10-CM

## 2021-12-18 DIAGNOSIS — E785 Hyperlipidemia, unspecified: Secondary | ICD-10-CM

## 2021-12-18 NOTE — Progress Notes (Signed)
Cardiology Office Note:    Date:  12/18/2021   ID:  Tracy Garrett, DOB 1955-08-26, MRN [id number]  PCP:  Lin Landsman, MD  Cardiologist:  Buford Dresser, MD  Referring MD: Evlyn Courier, PA-C   CC: new patient evaluation for palpitations   History of Present Illness:    Tracy Garrett is a 66 y.o. female with a hx of breast cancer, diabetes, hypertension who is seen as a new consult at the request of Evlyn Courier, PA-C for the evaluation and management of palpitations.  ER note from 11/19/21 reviewed. Presented for ankle swelling, sinus congestion, cough, and fluttering sensation. Labs including hsTn nl, ECG NSR.   Today: Concerns today: pain in left leg, intermittent left ankle edema, discomfort in her left upper chest.   Chest discomfort: -Initial onset: has been going on at least 6 mos-1 year, she remembers mentioning it to Dr. Jana Hakim (last visit 04/2021) -Quality: difficult to describe. Mild, focal. Not dull but not sharp. -Frequency: varies, had been very rare, no more than weekly now -Duration: few hours -Associated symptoms: none -Aggravating/alleviating factors: no aggravating factors that she can think of. Sometimes uses heating pad, which helps -Prior cardiac history: none. Only recently told she had a heart murmur. -Prior ECG: normal -Prior workup: ER visit normal. Prior echoes normal. -Alcohol: none -Tobacco: never -Comorbidities: endorses diabetes, hypertension, hyperlipidemia. -Exercise level: walks with a cane due to knee pain, no routine exercise. -Cardiac ROS: no shortness of breath, no PND, no orthopnea, no syncope. Denies palpitations today -Family history:  paternal grandfather had unclear heart issues, brother had open heart surgery, not sure why  Past Medical History:  Diagnosis Date   Allergy    Anemia    Arthritis    Cancer ([place])    right breast cancer   Cataracts, bilateral    Chronic kidney disease    CKD   Family history of  breast cancer    Family history of multiple myeloma    Fatty liver disease, nonalcoholic 07/17/4707   [place] appears normal on follow up US 09/2019   GERD (gastroesophageal reflux disease)    History of kidney stones    x2 episodes   Hyperlipidemia    Hypertension    PONV (postoperative nausea and vomiting)    Sleep apnea    test was dx, as mild- no further tx. 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;  Surgeon: TJackolyn Confer MD;  Location: [facility];  Service: General;  Laterality: N/A;   LYSIS OF ADHESION  10/10/2014   Procedure: LYSIS OF ADHESION;  Surgeon: TJackolyn Confer MD;  Location: [facility];  Service: General;;   MASTECTOMY W/ SENTINEL NODE BIOPSY Bilateral 01/02/2020   Procedure: BILATERAL MASTECTOMY WITH RIGHT SENTINEL LYMPH NODE MAPPING;  Surgeon: CErroll Luna MD;  [facility]: [facility]  Service: General;  Laterality: Bilateral;  PEC  BLOCK   PORTACATH PLACEMENT Right 01/02/2020   Procedure: INSERTION PORT-A-CATH WITH ULTRASOUND GUIDANCE;  Surgeon: Erroll Luna, MD;  Location: [facility];  Service: General;  Laterality: Right;   TONSILLECTOMY     and adenoids removed age 85   VAGINAL HYSTERECTOMY  2002    Current Medications: Current Outpatient Medications on File Prior to Visit  Medication Sig   anastrozole (ARIMIDEX) 1 MG tablet Take 1 tablet (1 mg total) by mouth daily.   aspirin EC 81  MG tablet Take 81 mg by mouth daily. Swallow whole.   cetirizine (ZYRTEC) 10 MG tablet Take 10 mg by mouth daily.   Cholecalciferol (VITAMIN D3) 5000 UNITS TABS Take 5,000 Units by mouth daily.    Cranberry 1000 MG CAPS cranberry   glucose blood (CONTOUR NEXT TEST) test strip Check blood sugar 1 time daily-DX-E11.9   influenza vac split quadrivalent PF (FLUARIX) 0.5 ML injection Inject 0.5 mLs into the muscle.   losartan (COZAAR) 50 MG tablet TAKE 1/2 TO 1 TABLET BY  MOUTH DAILY FOR BLOOD  PRESSURE   Omega-3 Fatty Acids (OMEGA 3 PO) Take 1 capsule by mouth 2 (two) times daily.   OZEMPIC, 0.25 OR 0.5 MG/DOSE, 2 MG/3ML SOPN SMARTSIG:0.5 Milligram(s) Topical Once a Week   pantoprazole (PROTONIX) 40 MG tablet TAKE 1 TABLET BY MOUTH  DAILY TO PREVENT HEARTBURN  AND INDIGESTION   rosuvastatin (CRESTOR) 5 MG tablet TAKE 1 TABLET BY MOUTH EVERY DAY   [DISCONTINUED] prochlorperazine (COMPAZINE) 10 MG tablet Take 1 tablet (10 mg total) by mouth every 6 (six) hours as needed (Nausea or vomiting).   No current facility-administered medications on file prior to visit.     Allergies:   Pecan nut (diagnostic), Hydromorphone, and Other   Social History   Tobacco Use   Smoking status: Never   Smokeless tobacco: Never  Vaping Use   Vaping Use: Never used  Substance Use Topics   Alcohol use: No   Drug use: No    Family History: family history includes Alzheimer's disease in her maternal grandmother; Breast cancer in her maternal aunt and paternal aunt; Breast cancer (age of onset: 56) in her cousin; Breast cancer (age of onset: 24) in her cousin; Breast cancer (age of onset: 26) in her mother; CAD (age of onset: 82) in her brother; Cancer in her maternal uncle; Diabetes type I in her half-sister; Emphysema in her paternal grandfather; Heart attack in her paternal grandfather; Heart disease in her father; Lung cancer in her paternal grandfather; Multiple myeloma in her paternal aunt; Multiple myeloma (age  of onset: 64) in her maternal aunt.  ROS:   Please see the history of present illness.  Additional pertinent ROS: Constitutional: Negative for chills, fever, night sweats, unintentional weight loss  HENT: Negative for ear pain and hearing loss.   Eyes: Negative for loss of vision and eye pain.  Respiratory: Negative for cough, sputum, wheezing.   Cardiovascular: See HPI. Gastrointestinal: Negative for abdominal pain, melena, and hematochezia.  Genitourinary: Negative for dysuria and hematuria.  Musculoskeletal: Negative for falls and myalgias.  Skin: Negative for itching and rash.  Neurological: Negative for focal weakness, focal sensory changes and loss of consciousness.  Endo/Heme/Allergies: Does not bruise/bleed easily.     EKGs/Labs/Other Studies Reviewed:    The following studies were reviewed today: Echo 10/14/20 1. Left ventricular ejection fraction, by estimation, is 55 to 60%. The  left ventricle has normal function. The left ventricle has no regional  wall motion abnormalities. Left  ventricular diastolic parameters are  consistent with Grade II diastolic  dysfunction (pseudonormalization). The average left ventricular global  longitudinal strain is -20.9 %. The global longitudinal strain is normal.   2. Right ventricular systolic function is normal. The right ventricular  size is normal. There is normal pulmonary artery systolic pressure.   3. The mitral valve is normal in structure. Mild mitral valve  regurgitation. No evidence of mitral stenosis.   4. The aortic valve is tricuspid. Aortic valve regurgitation is mild. No  aortic stenosis is present.   5. The inferior vena cava is normal in size with greater than 50%  respiratory variability, suggesting right atrial pressure of 3 mmHg.   EKG:  EKG is personally reviewed.   12/18/21: NSR at 89 bpm  Recent Labs: 10/27/2021: ALT 15 11/19/2021: BUN 13; Creatinine, Ser 1.01; Hemoglobin 13.3; Platelets 254; Potassium 3.9;  Sodium 139  Recent Lipid Panel    Component Value Date/Time   CHOL 172 09/03/2020 1558   TRIG 183 (H) 09/03/2020 1558   HDL 62 09/03/2020 1558   CHOLHDL 2.8 09/03/2020 1558   VLDL 34 (H) 10/28/2016 1210   LDLCALC 82 09/03/2020 1558    Physical Exam:    VS:  BP 115/60   Pulse 89   Ht '5\' 2"'  (1.575 m)   Wt 216 lb 3.2 oz (98.1 kg)   SpO2 97%   BMI 39.54 kg/m     Wt Readings from Last 3 Encounters:  12/18/21 216 lb 3.2 oz (98.1 kg)  11/19/21 212 lb (96.2 kg)  10/27/21 214 lb 8 oz (97.3 kg)    GEN: Well nourished, well developed in no acute distress HEENT: Normal, moist mucous membranes NECK: No JVD CARDIAC: regular rhythm, normal S1 and S2, no rubs or gallops. 1/6 systolic murmur. VASCULAR: Radial and DP pulses 2+ bilaterally. No carotid bruits RESPIRATORY:  Clear to auscultation without rales, wheezing or rhonchi  ABDOMEN: Soft, non-tender, non-distended MUSCULOSKELETAL:  Ambulates independently SKIN: Warm and dry, no edema NEUROLOGIC:  Alert and oriented x 3. No focal neuro deficits noted. PSYCHIATRIC:  Normal affect    ASSESSMENT:    1. Chest pain of uncertain etiology   2. Essential hypertension   3. Hyperlipidemia associated with type 2 diabetes mellitus ([name])   4. Obesity (BMI 30-39.9)   5. Cardiac risk counseling   6. Counseling on health promotion and disease prevention   7. Murmur, cardiac    PLAN:    Chest discomfort -largely atypical, short in duration, no associated symptoms -reassuringly normal hsTn in the ER -reviewed red flag warning signs that need immediate medical attention  Murmur -soft, systolic, low risk -prior echo showed mild MR  Type II diabetes Hypercholesterolemia Obesity, BMI 39.5 -on ozempic -on aspirin 81 mg daily -on rosuvastatin 5 mg   Hypertension: -on losartan 50 mg daily  Cardiac risk counseling and prevention recommendations: -recommend heart healthy/Mediterranean diet, with whole grains, fruits, vegetable, fish,  lean meats, nuts, and olive oil. Limit salt. -recommend moderate walking, 3-5 times/week for 30-50 minutes each session. Aim for at least 150 minutes.week. Goal should be pace of 3 miles/hours, or walking 1.5 miles in 30 minutes -recommend avoidance of tobacco products. Avoid excess alcohol. -ASCVD risk score: The 10-year ASCVD risk score (Arnett DK, et al., 2019) is: 13.9%   Values used to calculate the score:     Age: 24 years     Sex: Female     Is Non-Hispanic African American: Yes     Diabetic:  Yes     Tobacco smoker: No     Systolic Blood Pressure: 252 mmHg     Is BP treated: Yes     HDL Cholesterol: 62 mg/dL     Total Cholesterol: 172 mg/dL    Plan for follow up: 1 year or sooner as needed  Buford Dresser, MD, PhD, [facility]    Medication Adjustments/Labs and Tests Ordered: Current medicines are reviewed at length with the patient today.  Concerns regarding medicines are outlined above.  Orders Placed This Encounter  Procedures   EKG 12-Lead   No orders of the defined types were placed in this encounter.   Patient Instructions  Medication Instructions:  Your Physician recommend you continue on your current medication as directed.    *If you need a refill on your cardiac medications before your next appointment, please call your pharmacy*   Lab Work: None ordered today   Testing/Procedures: None ordered today   Follow-Up: At [facility], you and your health needs are our priority.  As part of our continuing mission to provide you with exceptional heart care, we have created designated Provider Care Teams.  These Care Teams include your primary Cardiologist (physician) and Advanced Practice Providers (APPs -  Physician Assistants and Nurse Practitioners) who all work together to provide you with the care you need, when you need it.  We recommend signing up for the patient portal called "MyChart".  Sign up information is provided  on this After Visit Summary.  MyChart is used to connect with patients for Virtual Visits (Telemedicine).  Patients are able to view lab/test results, encounter notes, upcoming appointments, etc.  Non-urgent messages can be sent to your provider as well.   To learn more about what you can do with MyChart, go to [email or web address].    Your next appointment:   1 year(s)  The format for your next appointment:   In Person  Provider:   Buford Dresser, MD{         Signed, Buford Dresser, MD PhD 12/18/2021 3:18 PM    [place]

## 2021-12-18 NOTE — Patient Instructions (Signed)

## 2022-01-14 IMAGING — US US RENAL
1 series · 14 of 25 positions shown · non-contrast
Comparison: Abdomen pelvis CT 05/27/2014.

CLINICAL DATA: Persistent proteinuria.  Worsening renal failure.

EXAM:
RENAL / URINARY TRACT ULTRASOUND COMPLETE

[Series 1: us renal · 14 of 55 slices shown]
[im 1/55]
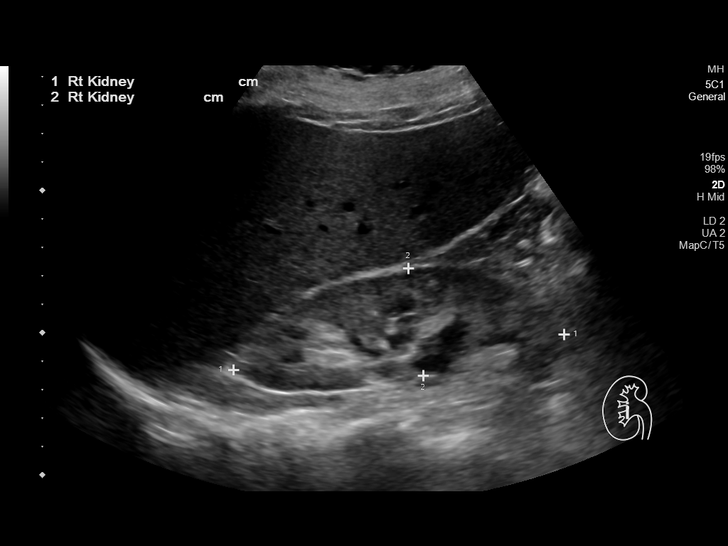
[im 5/55]
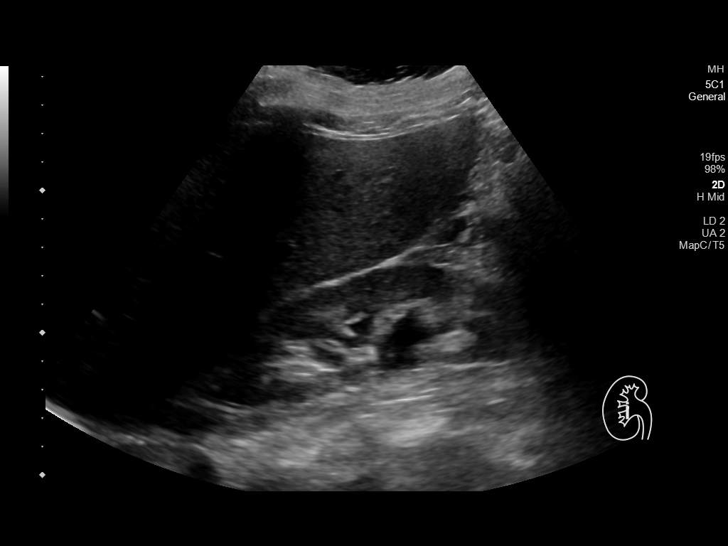
[im 10/55]
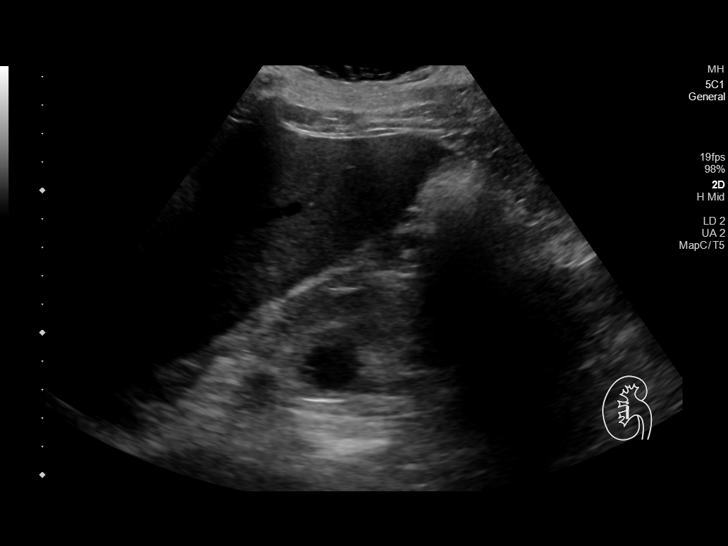
[im 14/55]
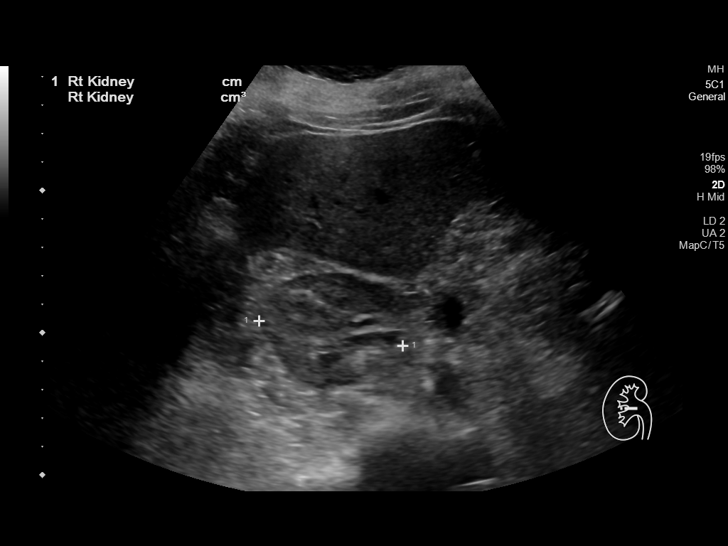
[im 19/55]
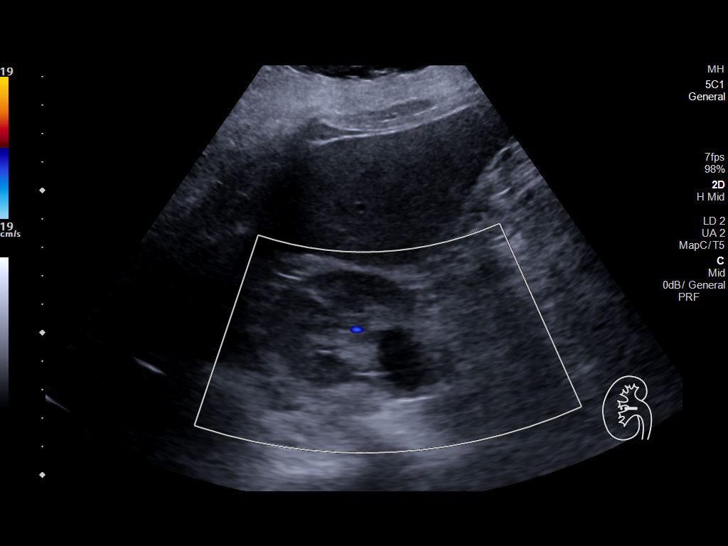
[im 21/55]
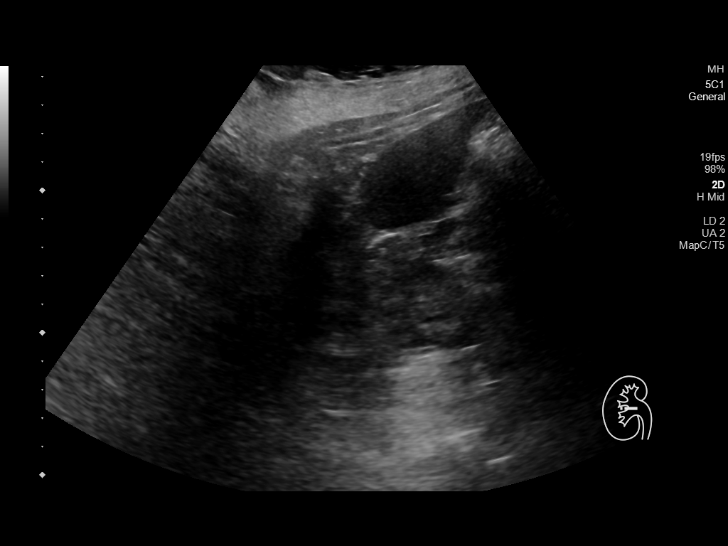
[im 25/55]
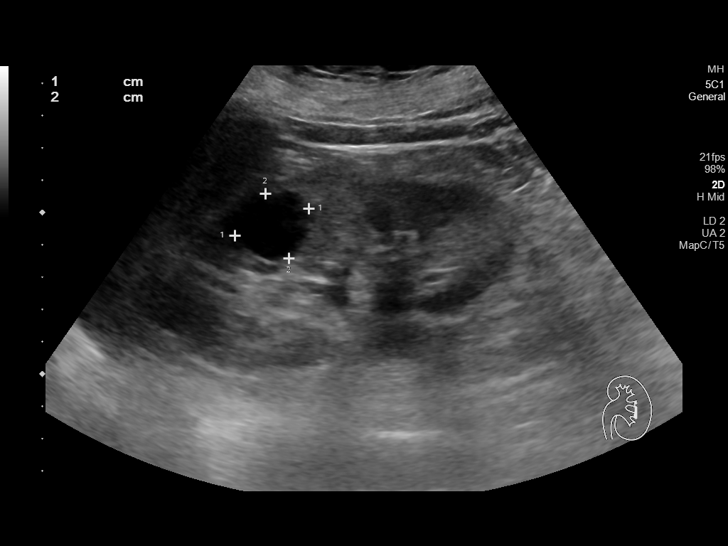
[im 30/55]
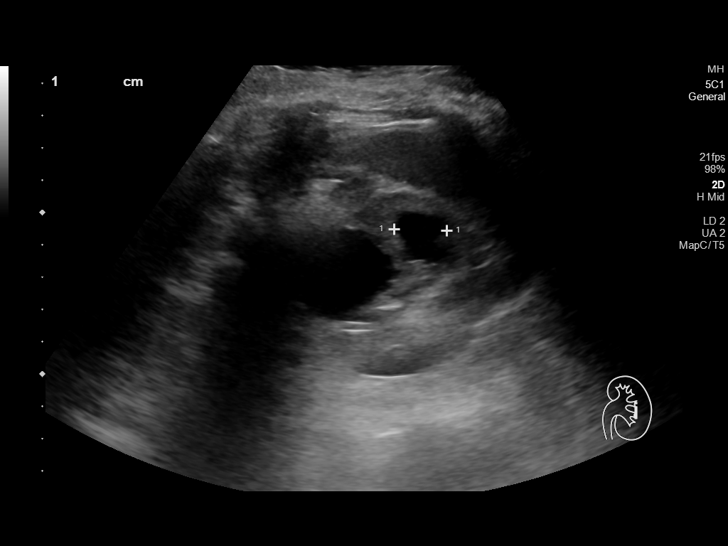
[im 34/55]
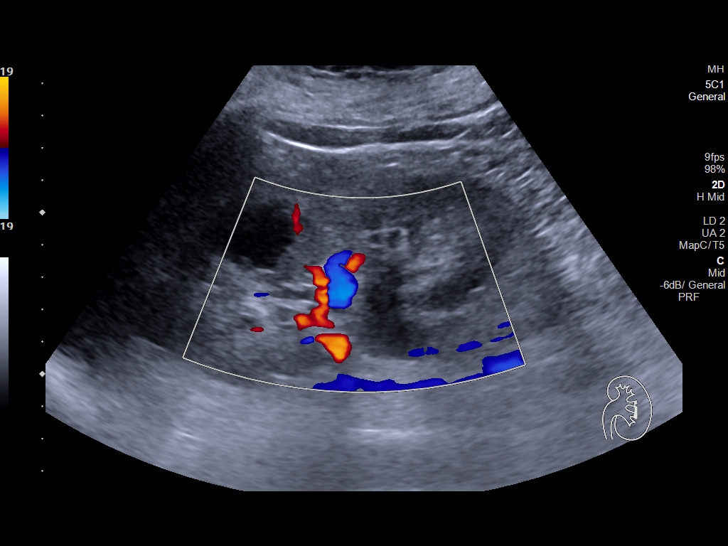
[im 37/55]
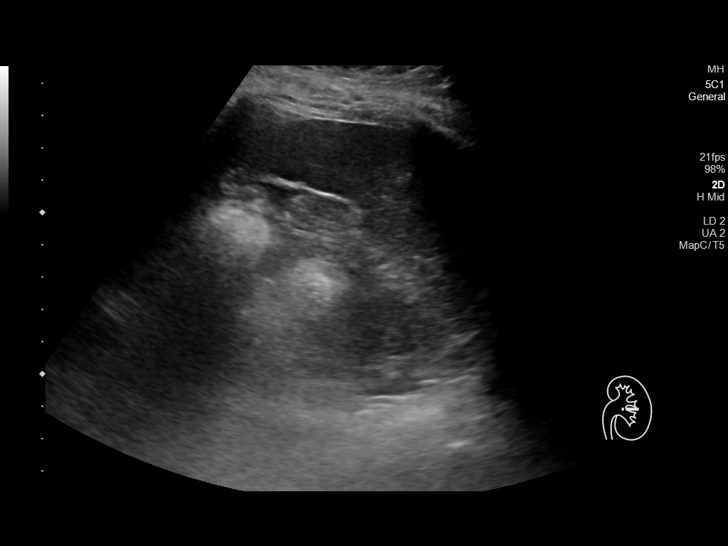
[im 41/55]
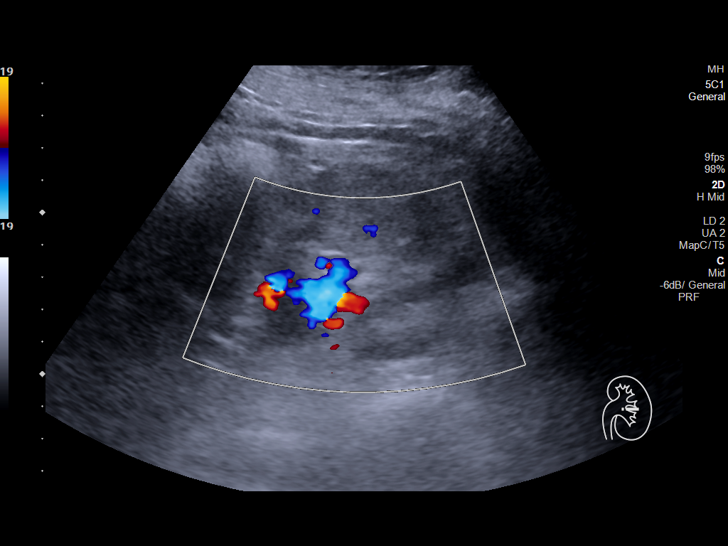
[im 46/55]
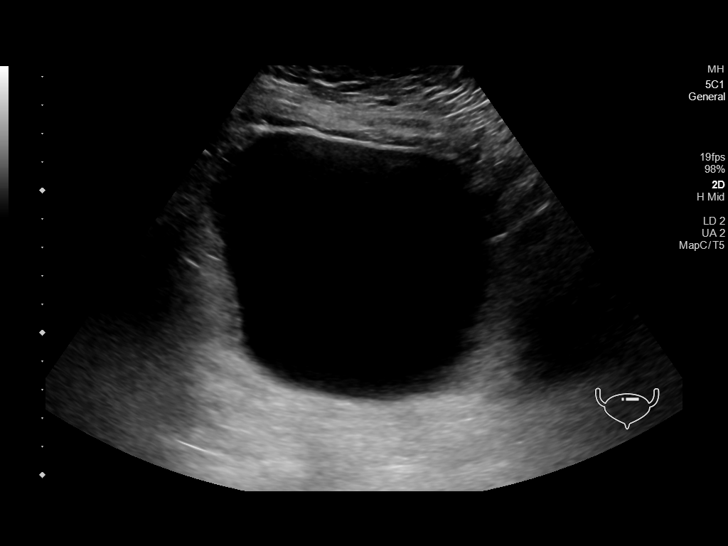
[im 50/55]
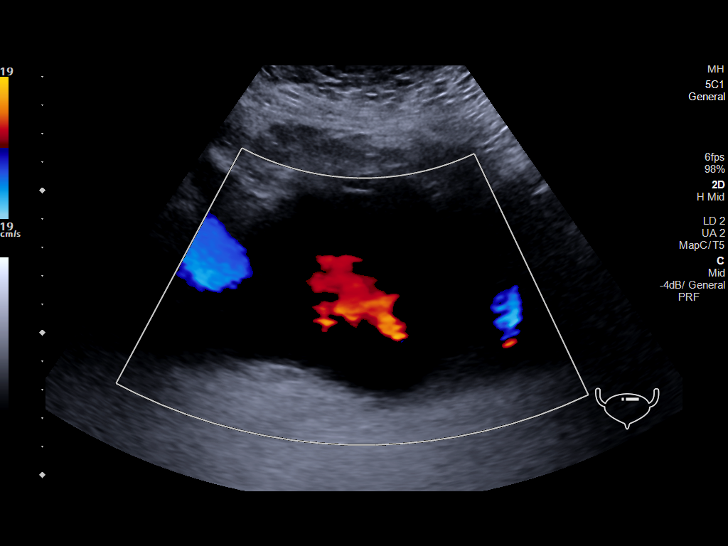
[im 55/55]
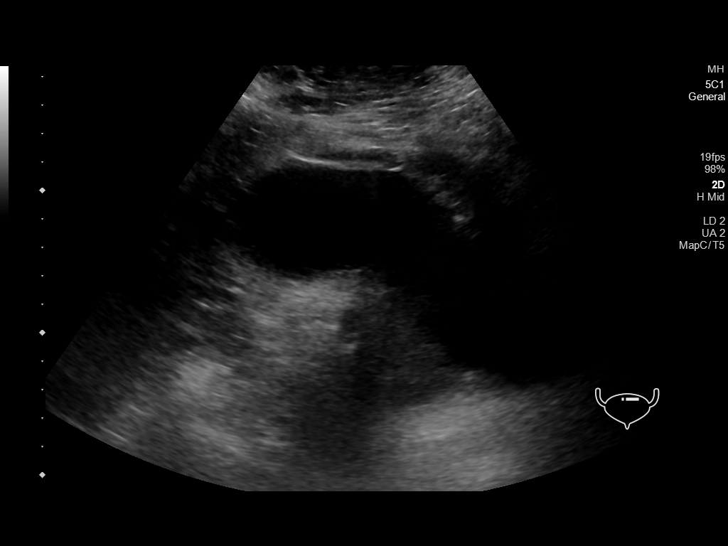

[14 of 25 positions shown; findings below may reference images not displayed]

FINDINGS: Right Kidney:

Renal measurements: 11.7 x 3.8 x 5.1 cm = volume: 119 mL. Borderline
increased echogenicity. Similar appearance prominent renal pelvis.

Left Kidney:

Renal measurements: Limb 0.6 x 4.8 x 4.8 cm = volume: 142 mL. No
overt hydronephrosis. Adjacent hypoechoic lesions in the upper pole
left kidney measure 2.7 and 1.9 cm respectively. These are similar
to CT scan of 05/27/2014.

Bladder:

Appears normal for degree of bladder distention.

Other:

None.
IMPRESSION: No hydronephrosis. No findings to explain the patient's history of
worsening renal failure.

## 2022-02-11 ENCOUNTER — Other Ambulatory Visit: Payer: Self-pay | Admitting: Internal Medicine

## 2022-02-11 DIAGNOSIS — K219 Gastro-esophageal reflux disease without esophagitis: Secondary | ICD-10-CM

## 2022-02-16 ENCOUNTER — Emergency Department (HOSPITAL_BASED_OUTPATIENT_CLINIC_OR_DEPARTMENT_OTHER)
Admission: EM | Admit: 2022-02-16 | Discharge: 2022-02-16 | Disposition: A | Discharge status: Home / Self Care | Payer: Medicare Other | Attending: Emergency Medicine | Admitting: Emergency Medicine

## 2022-02-16 ENCOUNTER — Encounter (HOSPITAL_BASED_OUTPATIENT_CLINIC_OR_DEPARTMENT_OTHER): Payer: Self-pay | Admitting: *Deleted

## 2022-02-16 ENCOUNTER — Other Ambulatory Visit: Payer: Self-pay

## 2022-02-16 ENCOUNTER — Emergency Department (HOSPITAL_BASED_OUTPATIENT_CLINIC_OR_DEPARTMENT_OTHER): Payer: Medicare Other | Admitting: Radiology

## 2022-02-16 DIAGNOSIS — W19XXXA Unspecified fall, initial encounter: Secondary | ICD-10-CM | POA: Insufficient documentation

## 2022-02-16 DIAGNOSIS — M25512 Pain in left shoulder: Secondary | ICD-10-CM | POA: Diagnosis present

## 2022-02-16 DIAGNOSIS — M25532 Pain in left wrist: Secondary | ICD-10-CM | POA: Diagnosis not present

## 2022-02-16 DIAGNOSIS — R0781 Pleurodynia: Secondary | ICD-10-CM | POA: Insufficient documentation

## 2022-02-16 DIAGNOSIS — Z7982 Long term (current) use of aspirin: Secondary | ICD-10-CM | POA: Insufficient documentation

## 2022-02-16 DIAGNOSIS — Z79899 Other long term (current) drug therapy: Secondary | ICD-10-CM | POA: Diagnosis not present

## 2022-02-16 DIAGNOSIS — I129 Hypertensive chronic kidney disease with stage 1 through stage 4 chronic kidney disease, or unspecified chronic kidney disease: Secondary | ICD-10-CM | POA: Insufficient documentation

## 2022-02-16 DIAGNOSIS — N189 Chronic kidney disease, unspecified: Secondary | ICD-10-CM | POA: Insufficient documentation

## 2022-02-16 DIAGNOSIS — E1122 Type 2 diabetes mellitus with diabetic chronic kidney disease: Secondary | ICD-10-CM | POA: Diagnosis not present

## 2022-02-16 DIAGNOSIS — M25572 Pain in left ankle and joints of left foot: Secondary | ICD-10-CM | POA: Insufficient documentation

## 2022-02-16 MED ORDER — ACETAMINOPHEN 500 MG PO TABS
1000.0000 mg | ORAL_TABLET | Freq: Once | ORAL | Status: AC
  Administered 2022-02-16: 1000 mg via ORAL
  Filled 2022-02-16: qty 2

## 2022-02-16 NOTE — ED Notes (Signed)
Discharge instructions, follow up care, and pain management reviewed and explained, pt verbalized understanding and had no further questions on d/c. Pt caox4 and ambulatory on d/c.

## 2022-02-16 NOTE — ED Triage Notes (Signed)
[place] off a step Friday and would like to be evaluated.  Pt fell on her left side and has soreness in left side.  Pt is ambulatory and states that she has been to work but her family encouraged her to be evaluated.

## 2022-02-16 NOTE — Discharge Instructions (Signed)
Your x-ray imaging was negative for acute traumatic injuries.  You likely have musculoskeletal pain and discomfort after your fall on Friday.  Recommend continued Tylenol for pain control given your history of CKD, avoid NSAIDs.

## 2022-02-16 NOTE — ED Provider Notes (Signed)
[facility] EMERGENCY DEPT Provider Note   CSN: [id number] Arrival date & time: 02/16/22  1024     History  Chief Complaint  Patient presents with   Tracy Garrett is a 66 y.o. female.   Fall     66 year old female with medical history significant for CKD, HLD, OSA, DM 2, HTN, GERD presenting to the emergency department as a nonlevel trauma after a fall.  The patient states that on Friday she lost her balance while trying to get into her a door while holding groceries and fell onto her left side.  Denies any head trauma or loss of consciousness.  She denied any neck trauma.  She denies any headache or neck pain.  She is not on anticoagulation.  She has had persistent soreness of the left side.  She has been taking Tylenol for the pain.  She endorses pain in her left shoulder, left ribs, left ankle, left wrist.  She arrived to the emergency department GCS 15, ABC intact.  Home Medications Prior to Admission medications   Medication Sig Start Date End Date Taking? Authorizing Provider  anastrozole (ARIMIDEX) 1 MG tablet Take 1 tablet (1 mg total) by mouth daily. 04/27/21   Magrinat, Virgie Dad, MD  aspirin EC 81 MG tablet Take 81 mg by mouth daily. Swallow whole.    [provider]  cetirizine (ZYRTEC) 10 MG tablet Take 10 mg by mouth daily.    [provider]  Cholecalciferol (VITAMIN D3) 5000 UNITS TABS Take 5,000 Units by mouth daily.     [provider]  Cranberry 1000 MG CAPS cranberry    [provider]  FARXIGA 10 MG TABS tablet Take 10 mg by mouth daily. 01/22/22   [provider]  glucose blood (CONTOUR NEXT TEST) test strip Check blood sugar 1 time daily-[id number] 10/14/16   Unk Pinto, MD  influenza vac split quadrivalent PF (FLUARIX) 0.5 ML injection Inject 0.5 mLs into the muscle. 03/24/21   Carlyle Basques, MD  losartan (COZAAR) 50 MG tablet TAKE 1/2 TO 1 TABLET BY  MOUTH DAILY FOR BLOOD  PRESSURE  05/13/21   Alycia Rossetti, NP  Omega-3 Fatty Acids (OMEGA 3 PO) Take 1 capsule by mouth 2 (two) times daily.    [provider]  OZEMPIC, 0.25 OR 0.5 MG/DOSE, 2 MG/3ML SOPN SMARTSIG:0.5 Milligram(s) Topical Once a Week 11/27/21   [provider]  pantoprazole (PROTONIX) 40 MG tablet TAKE 1 TABLET DAILY TO PREVENT HEARTBURN & INDIGESTION. 02/12/22   Alycia Rossetti, NP  rosuvastatin (CRESTOR) 5 MG tablet TAKE 1 TABLET BY MOUTH EVERY DAY 06/12/21   Liane Comber, NP  prochlorperazine (COMPAZINE) 10 MG tablet Take 1 tablet (10 mg total) by mouth every 6 (six) hours as needed (Nausea or vomiting). 01/14/20 04/30/20  Magrinat, Virgie Dad, MD      Allergies    Pecan nut (diagnostic), Hydromorphone, and Other    Review of Systems   Review of Systems  All other systems reviewed and are negative.   Physical Exam Updated Vital Signs BP 108/81   Pulse 86   Temp 98.3 F (36.8 C) (Oral)   Resp 18   Ht '5\' 2"'$  (1.575 m)   Wt 94.8 kg   SpO2 99%   BMI 38.23 kg/m  Physical Exam Vitals and nursing note reviewed.  Constitutional:      General: She is not in acute distress.    Appearance: She is well-developed.  Comments: GCS 15, ABC intact  HENT:     Head: Normocephalic and atraumatic.  Eyes:     Extraocular Movements: Extraocular movements intact.     Conjunctiva/sclera: Conjunctivae normal.     Pupils: Pupils are equal, round, and reactive to light.  Neck:     Comments: No midline tenderness to palpation of the cervical spine.  Range of motion intact Cardiovascular:     Rate and Rhythm: Normal rate and regular rhythm.  Pulmonary:     Effort: Pulmonary effort is normal. No respiratory distress.     Breath sounds: Normal breath sounds.  Chest:     Comments: Clavicles stable nontender to AP compression.  Chest wall stable to AP and lateral compression, some left-sided chest wall tenderness present Abdominal:     Palpations: Abdomen is soft.     Tenderness: There is no  abdominal tenderness.  Musculoskeletal:     Cervical back: Neck supple.     Comments: No midline tenderness to palpation of the thoracic or lumbar spine.  Extremities atraumatic with intact range of motion with the exception of tenderness to palpation of the left shoulder, intact range of motion, distally neurovascular intact, tenderness palpation of the distal radius on the left, 2+ radial pulses, mild tenderness about the metacarpals on the left.  Negative scaphoid tenderness.  No tenderness palpation of the medial and lateral malleoli.  Patient ambulatory and able to bear weight on the left ankle.  Skin:    General: Skin is warm and dry.  Neurological:     Mental Status: She is alert.     Comments: Cranial nerves II through XII grossly intact.  Moving all 4 extremities spontaneously.  Sensation grossly intact all 4 extremities     ED Results / Procedures / Treatments   Labs (all labs ordered are listed, but only abnormal results are displayed) Labs Reviewed - No data to display  EKG None  Radiology DG Ribs Unilateral W/Chest Left  Result Date: 02/16/2022 CLINICAL DATA:  Fall with left rib pain. EXAM: LEFT RIBS AND CHEST - 3+ VIEW COMPARISON:  Chest x-ray on 11/19/2021 FINDINGS: Chest shows normal heart size and mediastinal contours. There is no evidence of pulmonary edema, consolidation, pneumothorax or pleural fluid. Additional left rib films demonstrate no evidence of acute rib fracture or bony lesion. IMPRESSION: No evidence of left rib fracture or other acute findings. Electronically Signed   By: Aletta Edouard M.D.   On: 02/16/2022 11:34   DG Hand Complete Left  Result Date: 02/16/2022 CLINICAL DATA:  Fall with left upper extremity pain. EXAM: LEFT HAND - COMPLETE 3+ VIEW COMPARISON:  None Available. FINDINGS: There is no evidence of fracture or dislocation. 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 Soft tissues are unremarkable. IMPRESSION: No acute fracture identified.  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 Electronically Signed   By: Aletta Edouard M.D.   On: 02/16/2022 11:32   DG Wrist Complete Left  Result Date: 02/16/2022 CLINICAL DATA:  Fall with left upper extremity pain. EXAM: LEFT WRIST - COMPLETE 3+ VIEW COMPARISON:  None Available. FINDINGS: There is no evidence of fracture or dislocation. There is no evidence of arthropathy or other focal bone abnormality. Soft tissues are unremarkable. IMPRESSION: Negative. Electronically Signed   By: Aletta Edouard M.D.   On: 02/16/2022 11:31   DG Shoulder Left  Result Date: 02/16/2022 CLINICAL DATA:  Fall with left upper extremity pain. EXAM: LEFT SHOULDER - 2+ VIEW COMPARISON:  None Available. FINDINGS: There is  no evidence of acute fracture or dislocation. Subacromial spur formation. Soft tissues are unremarkable. IMPRESSION: No acute fracture identified.  Subacromial spur formation present. Electronically Signed   By: Aletta Edouard M.D.   On: 02/16/2022 11:29    Procedures Procedures    Medications Ordered in ED Medications  acetaminophen (TYLENOL) tablet 1,000 mg (1,000 mg Oral Given 02/16/22 1125)    ED Course/ Medical Decision Making/ A&P                           Medical Decision Making Amount and/or Complexity of Data Reviewed Radiology: ordered.  Risk OTC drugs.     66 year old female with medical history significant for CKD, HLD, OSA, DM 2, HTN, GERD presenting to the emergency department as a nonlevel trauma after a fall.  The patient states that on Friday she lost her balance while trying to get into her a door while holding groceries and fell onto her left side.  Denies any head trauma or loss of consciousness.  She denied any neck trauma.  She denies any headache or neck pain.  She is not on anticoagulation.  She has had persistent soreness of the left side.  She has been taking Tylenol for the pain.  She endorses pain in her left shoulder, left ribs, left ankle, left wrist.  She  arrived to the emergency department GCS 15, ABC intact.  On arrival, the patient was vitally stable.   Physical exam: No midline tenderness to palpation of the thoracic or lumbar spine.  Extremities atraumatic with intact range of motion with the exception of tenderness to palpation of the left shoulder, intact range of motion, distally neurovascular intact, tenderness palpation of the distal radius on the left, 2+ radial pulses, mild tenderness about the metacarpals on the left.  Negative scaphoid tenderness.  No tenderness palpation of the medial and lateral malleoli.  Patient ambulatory and able to bear weight on the left ankle.  Patient also had left-sided chest wall tenderness to palpation.  Imaging of the left ribs and chest was negative for acute fracture or malalignment or intrathoracic abnormality, x-ray imaging of the left hand, left wrist, left shoulder was also negative for acute fracture or malalignment.  Suspect likely musculoskeletal soft tissue injury in the setting of the patient's recent fall.  Recommended Tylenol for continued pain control, recommended the patient avoid NSAIDs given her history of CKD.  Overall stable for discharge at this time.  Final Clinical Impression(s) / ED Diagnoses Final diagnoses:  Fall, initial encounter    Rx / DC Orders ED Discharge Orders     None         Regan Lemming, MD 02/16/22 1200

## 2022-02-26 ENCOUNTER — Encounter: Payer: Self-pay | Admitting: Oncology

## 2022-02-26 ENCOUNTER — Other Ambulatory Visit (HOSPITAL_COMMUNITY): Payer: Self-pay

## 2022-02-26 MED ORDER — FLUAD QUADRIVALENT 0.5 ML IM PRSY
0.5000 mL | PREFILLED_SYRINGE | INTRAMUSCULAR | 0 refills | Status: AC
  Filled 2022-02-26: qty 0.5, 1d supply, fill #0

## 2022-05-03 ENCOUNTER — Inpatient Hospital Stay: Payer: Medicare Other

## 2022-05-03 ENCOUNTER — Inpatient Hospital Stay: Payer: Medicare Other | Attending: Hematology and Oncology | Admitting: Hematology and Oncology

## 2022-05-03 ENCOUNTER — Encounter: Payer: Self-pay | Admitting: Hematology and Oncology

## 2022-05-03 ENCOUNTER — Other Ambulatory Visit: Payer: Self-pay

## 2022-05-03 VITALS — BP 124/75 | HR 90 | Temp 97.9°F | Resp 16 | Ht 62.0 in | Wt 212.3 lb

## 2022-05-03 DIAGNOSIS — C50411 Malignant neoplasm of upper-outer quadrant of right female breast: Secondary | ICD-10-CM | POA: Diagnosis not present

## 2022-05-03 DIAGNOSIS — E1122 Type 2 diabetes mellitus with diabetic chronic kidney disease: Secondary | ICD-10-CM | POA: Insufficient documentation

## 2022-05-03 DIAGNOSIS — Z801 Family history of malignant neoplasm of trachea, bronchus and lung: Secondary | ICD-10-CM | POA: Diagnosis not present

## 2022-05-03 DIAGNOSIS — Z807 Family history of other malignant neoplasms of lymphoid, hematopoietic and related tissues: Secondary | ICD-10-CM | POA: Diagnosis not present

## 2022-05-03 DIAGNOSIS — Z170 Estrogen receptor positive status [ER+]: Secondary | ICD-10-CM | POA: Diagnosis not present

## 2022-05-03 DIAGNOSIS — I129 Hypertensive chronic kidney disease with stage 1 through stage 4 chronic kidney disease, or unspecified chronic kidney disease: Secondary | ICD-10-CM | POA: Insufficient documentation

## 2022-05-03 DIAGNOSIS — M8580 Other specified disorders of bone density and structure, unspecified site: Secondary | ICD-10-CM | POA: Insufficient documentation

## 2022-05-03 DIAGNOSIS — Z803 Family history of malignant neoplasm of breast: Secondary | ICD-10-CM | POA: Insufficient documentation

## 2022-05-03 DIAGNOSIS — Z9013 Acquired absence of bilateral breasts and nipples: Secondary | ICD-10-CM | POA: Diagnosis not present

## 2022-05-03 DIAGNOSIS — N189 Chronic kidney disease, unspecified: Secondary | ICD-10-CM | POA: Insufficient documentation

## 2022-05-03 DIAGNOSIS — Z79811 Long term (current) use of aromatase inhibitors: Secondary | ICD-10-CM | POA: Diagnosis not present

## 2022-05-03 LAB — CBC WITH DIFFERENTIAL (CANCER CENTER ONLY)
Abs Immature Granulocytes: 0.03 10*3/uL (ref 0.00–0.07)
Basophils Absolute: 0 10*3/uL (ref 0.0–0.1)
Basophils Relative: 0 %
Eosinophils Absolute: 0.3 10*3/uL (ref 0.0–0.5)
Eosinophils Relative: 3 %
HCT: 40.7 % (ref 36.0–46.0)
Hemoglobin: 13.7 g/dL (ref 12.0–15.0)
Immature Granulocytes: 0 %
Lymphocytes Relative: 24 %
Lymphs Abs: 2.2 10*3/uL (ref 0.7–4.0)
MCH: 30.9 pg (ref 26.0–34.0)
MCHC: 33.7 g/dL (ref 30.0–36.0)
MCV: 91.7 fL (ref 80.0–100.0)
Monocytes Absolute: 0.9 10*3/uL (ref 0.1–1.0)
Monocytes Relative: 10 %
Neutro Abs: 5.7 10*3/uL (ref 1.7–7.7)
Neutrophils Relative %: 63 %
Platelet Count: 255 10*3/uL (ref 150–400)
RBC: 4.44 MIL/uL (ref 3.87–5.11)
RDW: 14 % (ref 11.5–15.5)
WBC Count: 9.1 10*3/uL (ref 4.0–10.5)
nRBC: 0 % (ref 0.0–0.2)

## 2022-05-03 LAB — CMP (CANCER CENTER ONLY)
ALT: 12 U/L (ref 0–44)
AST: 10 U/L — ABNORMAL LOW (ref 15–41)
Albumin: 4.4 g/dL (ref 3.5–5.0)
Alkaline Phosphatase: 97 U/L (ref 38–126)
Anion gap: 6 (ref 5–15)
BUN: 19 mg/dL (ref 8–23)
CO2: 30 mmol/L (ref 22–32)
Calcium: 9.8 mg/dL (ref 8.9–10.3)
Chloride: 104 mmol/L (ref 98–111)
Creatinine: 1.06 mg/dL — ABNORMAL HIGH (ref 0.44–1.00)
GFR, Estimated: 58 mL/min — ABNORMAL LOW (ref >60)
Glucose, Bld: 125 mg/dL — ABNORMAL HIGH (ref 70–99)
Potassium: 3.8 mmol/L (ref 3.5–5.1)
Sodium: 140 mmol/L (ref 135–145)
Total Bilirubin: 0.4 mg/dL (ref 0.3–1.2)
Total Protein: 7.5 g/dL (ref 6.5–8.1)

## 2022-05-03 NOTE — Progress Notes (Signed)
[place]  [phone] [phone]) [phone]     ID: Tracy Garrett DOB: 1955/07/13  MR#: [id number]  TKK#:[id number]  Patient Care Team: Tracy Landsman, Garrett as PCP - General (Family Medicine) Tracy Dresser, Garrett as PCP - Cardiology (Cardiology) Tracy Kaufmann, RN as Oncology Nurse Navigator Tracy Germany, RN as Oncology Nurse Navigator Tracy Luna, Garrett as Consulting Physician (General Surgery) Tracy Gibson, Garrett as Attending Physician (Radiation Oncology) Tracy Rossetti, Garrett as Consulting Physician (Orthopedic Surgery) Garrett, Tracy Fothergill, DPM as Consulting Physician (Podiatry) Tracy Dresser, Garrett as Consulting Physician (Cardiology) Tracy Pike, Garrett as Consulting Physician (Hematology and Oncology) Tracy Pike, Garrett OTHER Garrett:  CHIEF COMPLAINT: triple positive breast cancer (s/p bilateral mastectomies)  CURRENT TREATMENT: anastrozole  INTERVAL HISTORY:  Tracy Garrett returns today for follow up of her triple positive breast cancer.  She continues on anastrozole with good tolerance.  Since last visit, she denies any new health complaints.  She had bilateral mastectomy without reconstruction.  DEXA scan 06/25/2020 at [facility] showed a T score of -1.1. which is minimally osteopenic. Rest of the pertinent 10 point ROS reviewed and negative.  REVIEW OF SYSTEMS:  COVID 19 VACCINATION STATUS: fully vaccinated [employer]), with booster 05/2020; also had COVID in 2020   HISTORY OF CURRENT ILLNESS: From the original intake note:  Tracy Garrett had routine screening mammography on 05/01/2020 showing a possible abnormality in the right breast. She underwent right diagnostic mammography with tomography at [facility] on 05/09/2019 showing: breast density category A; probably-benign regional calcifications in the right breast at 11 o'clock. Short term follow up was recommended.   She returned for follow up at [facility] on 11/13/2019. She underwent right  diagnostic mammography and right breast ultrasonography showing: breast composition A; indeterminate calcifications in upper-outer right breast spanning 5 cm; 0.8 cm irregular mass in right breast at 9 o'clock.  Accordingly on 11/20/2019 she proceeded to biopsy of the right breast areas in question. The pathology from this procedure ([id number]) showed:  1. Right breast, UOQ  - foci of invasive and in situ mammary carcinoma, grade 2-3, e-cadherin positive.  2. Right breast, 9 o'clock  - invasive mammary carcinoma, grade 2-3, e-cadherin positive  - Prognostic indicators significant for: estrogen receptor, 95% positive and progesterone receptor, 25% positive, both with strong staining intensity. Proliferation marker Ki67 at 20%. HER2 equivocal by immunohistochemistry (2+), but positive by fluorescent in situ hybridization with a signals ratio 3.30 and number per cell 7.25.  The patient's subsequent history is as detailed below.   PAST MEDICAL HISTORY: Past Medical History:  Diagnosis Date   Allergy    Anemia    Arthritis    Cancer ([place])    right breast cancer   Cataracts, bilateral    Chronic kidney disease    CKD   Family history of breast cancer    Family history of multiple myeloma    Fatty liver disease, nonalcoholic 5/82/5189   [place] appears normal on follow up US 09/2019   GERD (gastroesophageal reflux disease)    History of kidney stones    x2 episodes   Hyperlipidemia    Hypertension    PONV (postoperative nausea and vomiting)    Sleep apnea    test was dx, as mild- no further tx. required   Type II or unspecified type diabetes mellitus without mention of complication, not stated as uncontrolled     PAST SURGICAL HISTORY: Past Surgical History:  Procedure Laterality Date   BREAST SURGERY  1999   Reduction Mammoplasties   CHOLECYSTECTOMY  2003   colonoscopsy  11/2007   neg- polyp removal with both procedures x2   CYSTOSCOPY     surgery to extract stone x 1 with stent    EUS N/A 07/26/2014   Procedure: UPPER ENDOSCOPIC ULTRASOUND (EUS) LINEAR;  Surgeon: Tracy Beams, Garrett;  Location: [facility];  Service: Endoscopy;  Laterality: N/A;   LAPAROTOMY N/A 10/10/2014   Procedure: 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;  Surgeon: Tracy Garrett;  Location: [facility] ORS;  Service: General;  Laterality: N/A;   LYSIS OF ADHESION  10/10/2014   Procedure: LYSIS OF ADHESION;  Surgeon: Tracy Garrett;  Location: [facility];  Service: General;;   MASTECTOMY W/ SENTINEL NODE BIOPSY Bilateral 01/02/2020   Procedure: BILATERAL MASTECTOMY WITH RIGHT SENTINEL LYMPH NODE MAPPING;  Surgeon: CErroll Luna Garrett;  [facility]  Service: General;  Laterality: Bilateral;  PEC BLOCK   PORTACATH PLACEMENT Right 01/02/2020   Procedure: INSERTION PORT-A-CATH WITH ULTRASOUND GUIDANCE;  Surgeon: CErroll Luna Garrett;  Location: [facility]  Service: General;  Laterality: Right;   TONSILLECTOMY     and adenoids removed age [id number]  VAGINAL HYSTERECTOMY  2002    FAMILY HISTORY: Family History  Problem Relation Age of Onset   Breast cancer Mother 768  Heart disease Father        smoker   Diabetes type I Half-Sister    Alzheimer's disease Maternal Grandmother    Emphysema Paternal Grandfather        smoker   Heart attack Paternal Grandfather    Lung cancer Paternal Grandfather    CAD Brother 539      stents   Breast cancer Maternal Aunt        dx. in her late 751s  Breast cancer Cousin 560  Cancer Maternal Uncle        dx. in his 636s tumor on the back of neck   Breast cancer Paternal Aunt        dx. >50   Multiple myeloma Maternal Aunt 92   Multiple myeloma Paternal Aunt        dx. >50   Breast cancer Cousin 572  Her father died at age [id number] Her mother died at age 107133 She has 2 brothers and had 1 sister who is deceased. She reports breast cancer on her maternal side-- her mother at age 107133 an aunt, and a cousin.   GYNECOLOGIC HISTORY:  No LMP recorded. Patient  has had a hysterectomy. Menarche: 66years old Age at first live birth: 66years old GColdstreamP 2 LMP unsure Contraceptive: never used HRT never used  Hysterectomy? Yes, 2002 BSO? 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    HEALTH MAINTENANCE: Social History   Tobacco Use   Smoking status: Never   Smokeless tobacco: Never  Vaping Use   Vaping Use: Never used  Substance Use Topics   Alcohol use: No   Drug use: No     Colonoscopy: 02/2019 (Dr. MCollene Mares, repeat due 2025  PAP: none on file (s/p hysterectomy)  Bone density:  never done   Allergies  Allergen Reactions   Pecan Nut (Diagnostic)     Other reaction(s): Unknown   Hydromorphone Rash    Arm reddened at IV med site   Other Itching and Rash    NUTS CAUSE MOUTH TO [facility]   Shellfish Allergy Other (See Comments) and Rash    Current Outpatient Medications  Medication Sig Dispense Refill   anastrozole (ARIMIDEX) 1 MG tablet Take 1 tablet (1 mg total) by mouth daily. 90 tablet 4   aspirin EC 81 MG tablet Take 81 mg by mouth daily. Swallow whole.     cetirizine (ZYRTEC) 10 MG tablet Take 10 mg by mouth daily.     Cholecalciferol (VITAMIN D3) 5000 UNITS TABS Take 5,000 Units by mouth daily.      Cranberry 1000 MG CAPS cranberry     FARXIGA 10 MG TABS tablet Take 10 mg by mouth daily.     glucose blood (CONTOUR NEXT TEST) test strip Check blood sugar 1 time daily-DX-E11.9 100 each 12   influenza vac split quadrivalent PF (FLUARIX) 0.5 ML injection Inject 0.5 mLs into the muscle. 0.5 mL 0   influenza vaccine adjuvanted (FLUAD QUADRIVALENT) 0.5 ML injection Inject 0.5 mLs into the muscle. 0.5 mL 0    losartan (COZAAR) 50 MG tablet TAKE 1/2 TO 1 TABLET BY  MOUTH DAILY FOR BLOOD  PRESSURE 90 tablet 3   Omega-3 Fatty Acids (OMEGA 3 PO) Take 1 capsule by mouth 2 (two) times daily.     OZEMPIC, 0.25 OR 0.5 MG/DOSE, 2 MG/3ML SOPN SMARTSIG:0.5 Milligram(s) Topical Once a Week     pantoprazole (PROTONIX) 40 MG tablet TAKE 1 TABLET DAILY TO PREVENT HEARTBURN & INDIGESTION. 90 tablet 3   rosuvastatin (CRESTOR) 5 MG tablet TAKE 1 TABLET BY MOUTH EVERY DAY 90 tablet 1   No current facility-administered medications for this visit.    OBJECTIVE: African-American woman using a cane  Vitals:   05/03/22 1350  BP: 124/75  Pulse: 90  Resp: 16  Temp: 97.9 F (36.6 Tracy)  SpO2: 98%      Body mass index is 38.83 kg/m.   Wt Readings from Last 3 Encounters:  05/03/22 212 lb 4.8 oz (96.3 kg)  02/16/22 209 lb (94.8 kg)  12/18/21 216 lb 3.2 oz (98.1 kg)     ECOG FS:1 - Symptomatic but completely ambulatory  Physical Exam Constitutional:      Appearance: Normal appearance.  Cardiovascular:     Rate and Rhythm: Normal rate and regular rhythm.     Pulses: Normal pulses.     Heart sounds: Normal heart sounds.  Pulmonary:     Effort: Pulmonary effort is normal.     Breath sounds: Normal breath sounds.  Chest:     Comments: Bilateral mastectomy.  No concern for recurrence.  No palpable regional adenopathy Musculoskeletal:        General: No swelling or tenderness.     Cervical back: Normal range of motion and neck supple. No rigidity.  Lymphadenopathy:     Cervical: No cervical adenopathy.  Skin:    General: Skin is warm and dry.  Neurological:     General: No focal deficit present.     Mental Status: She is alert.     LAB RESULTS:  CMP     Component Value Date/Time   NA 140 05/03/2022 1302   K 3.8 05/03/2022 1302   CL 104 05/03/2022 1302   CO2 30 05/03/2022 1302   GLUCOSE 125 (  H) 05/03/2022 1302   BUN 19 05/03/2022 1302   CREATININE 1.06 (H) 05/03/2022 1302   CREATININE 0.95  09/03/2020 1558   CALCIUM 9.8 05/03/2022 1302   PROT 7.5 05/03/2022 1302   ALBUMIN 4.4 05/03/2022 1302   AST 10 (L) 05/03/2022 1302   ALT 12 05/03/2022 1302   ALKPHOS 97 05/03/2022 1302   BILITOT 0.4 05/03/2022 1302   GFRNONAA 58 (L) 05/03/2022 1302   GFRNONAA 63 09/03/2020 1558   GFRAA 73 09/03/2020 1558    Lab Results  Component Value Date   ALBUMINELP 3.9 08/23/2019   A1GS 0.4 (H) 08/23/2019   A2GS 0.9 08/23/2019   BETS 0.5 08/23/2019   BETA2SER 0.4 08/23/2019   GAMS 0.9 08/23/2019   SPEI  08/23/2019     Comment:     . Alpha-1 globulin increase noted. .     Lab Results  Component Value Date   WBC 9.1 05/03/2022   NEUTROABS 5.7 05/03/2022   HGB 13.7 05/03/2022   HCT 40.7 05/03/2022   MCV 91.7 05/03/2022   PLT 255 05/03/2022    No results found for: "LABCA2"  No components found for: "TIRWER154"  No results for input(s): "INR" in the last 168 hours.  No results found for: "LABCA2"  No results found for: "MGQ676"  No results found for: "CAN125"  No results found for: "CAN153"  No results found for: "CA2729"  No components found for: "HGQUANT"  No results found for: "CEA1", "CEA" / No results found for: "CEA1", "CEA"   No results found for: "AFPTUMOR"  No results found for: "CHROMOGRNA"  No results found for: "KPAFRELGTCHN", "LAMBDASER", "KAPLAMBRATIO" (kappa/lambda light chains)  No results found for: "HGBA", "HGBA2QUANT", "HGBFQUANT", "HGBSQUAN" (Hemoglobinopathy evaluation)   No results found for: "LDH"  Lab Results  Component Value Date   IRON 54 11/26/2019   TIBC 378 11/26/2019   IRONPCTSAT 14 (L) 11/26/2019   (Iron and TIBC)  Lab Results  Component Value Date   FERRITIN 91 05/21/2015    Urinalysis    Component Value Date/Time   COLORURINE YELLOW 09/03/2020 1558   APPEARANCEUR CLEAR 09/03/2020 1558   LABSPEC 1.003 09/03/2020 1558   PHURINE 6.5 09/03/2020 1558   GLUCOSEU NEGATIVE 09/03/2020 1558   HGBUR NEGATIVE 09/03/2020  1558   BILIRUBINUR NEG 11/22/2013 1031   BILIRUBINUR neg 10/28/2013 1159   KETONESUR NEGATIVE 09/03/2020 1558   PROTEINUR NEGATIVE 09/03/2020 1558   UROBILINOGEN 0.2 11/22/2013 1031   NITRITE NEGATIVE 08/13/2019 1359   LEUKOCYTESUR 1+ (A) 08/13/2019 1359    STUDIES: No results found.   ELIGIBLE FOR AVAILABLE RESEARCH PROTOCOL: AET  ASSESSMENT: 66 y.o. [place] woman status post right breast upper outer quadrant biopsy 11/20/2019 for a clinical mT1b N0, stage IA invasive ductal carcinoma, grade 2 or 3, estrogen and progesterone receptor positive, HER-2 amplified, with an MIB-1 of 20%  (1) status post bilateral mastectomies 01/02/2020 showing  (a) on the right, an mpT2 pN0(i+), stage IB invasive ductal carcinoma, triple positive, with close but negative margins; 2 sentinel lymph nodes removed  (b) on the left, no malignancy identified  (Tracy) the patient is not planning on reconstruction  (2) adjuvant chemo immunotherapy consisting of Abraxane weekly x12 with trastuzumab started 02/04/2020 completed 04/21/2020  (a) echo 12/13/2019 shows an ejection fraction in the 55-60% range  (b) echo 04/03/2020 shows an ejection fraction in the 55-60% range.  (Tracy) echos 07/22/2020 and 10/14/2020 show no change in EF  (3) trastuzumab continued to total 1 year (last dose 01/20/2021)  (  4) adjuvant radiation 05/14/2020 through 06/30/2020 Site Technique Total Dose (Gy) Dose per Fx (Gy) Completed Fx Beam Energies  Chest Wall, Right: CW_Rt 3D 50/50 2 25/25 10X, 15X  Chest Wall, Right: CW_Rt_PAB_SCV 3D 45/45 1.8 25/25 10X, 15X  Chest Wall, Right: CW_Rt_Bst Electron 10/10 2 5/5 6E   (5) genetics testing 12/16/2019 through the [facility] Breast Cancer STAT panel + Common Hereditary Cancers panel found no deleterious mutations in ATM, BRCA1, BRCA2, CDH1, CHEK2, PALB2, PTEN, STK11 and TP53. The Common Hereditary Cancers Panel offered by [facility] includes sequencing and/or deletion duplication testing of the  following 48 genes: APC, ATM, AXIN2, BARD1, BMPR1A, BRCA1, BRCA2, BRIP1, CDH1, CDK4, CDKN2A (p14ARF), CDKN2A (p16INK4a), CHEK2, CTNNA1, DICER1, EPCAM (Deletion/duplication testing only), GREM1 (promoter region deletion/duplication testing only), KIT, MEN1, MLH1, MSH2, MSH3, MSH6, MUTYH, NBN, NF1, NTHL1, PALB2, PDGFRA, PMS2, POLD1, POLE, PTEN, RAD50, RAD51C, RAD51D, RNF43, SDHB, SDHC, SDHD, SMAD4, SMARCA4. STK11, TP53, TSC1, TSC2, and VHL.  The following genes were evaluated for sequence changes only: SDHA and HOXB13 Tracy.251G>A variant only.  (a) Two variants of uncertain significance (VUS) were detected - one in the BRIP1 gene called Tracy.1735C>T and a second in the POLD1 gene called Tracy.2861C>G.   (6) anastrozole started 10/27/2020  (a) [facility] 06/25/2020 shows a T score of -1.1  PLAN:  Mylynn is about two years from definitive surgery for her breast cancer with no evidence of disease recurrence.  She is here for follow-up on anastrozole.  She tolerates it very well.  Physical examination no concerns, she had bilateral mastectomy.  No palpable regional adenopathy.  At this time since she is more than 2 years out from diagnosis, we will follow-up with her annually.  She is agreeable to this plan.  From bone density standpoint, she has minimal osteopenia, encourage calcium as tolerated, vitamin D supplements and weightbearing exercises.  Total time spent: 20 minutes *Total Encounter Time as defined by the Centers for Medicare and Medicaid Services includes, in addition to the face-to-face time of a patient visit (documented in the note above) non-face-to-face time: obtaining and reviewing outside history, ordering and reviewing medications, tests or procedures, care coordination (communications with other health care professionals or caregivers) and documentation in the medical record.

## 2022-05-30 ENCOUNTER — Other Ambulatory Visit: Payer: Self-pay | Admitting: Nurse Practitioner

## 2022-05-30 DIAGNOSIS — I10 Essential (primary) hypertension: Secondary | ICD-10-CM

## 2022-06-02 ENCOUNTER — Other Ambulatory Visit: Payer: Self-pay | Admitting: *Deleted

## 2022-06-02 MED ORDER — ANASTROZOLE 1 MG PO TABS
1.0000 mg | ORAL_TABLET | Freq: Every day | ORAL | 40 refills | Status: DC
Start: 2022-06-02 — End: 2023-07-10

## 2022-08-28 LAB — LAB REPORT - SCANNED: EGFR: 65

## 2022-11-02 ENCOUNTER — Encounter: Payer: Self-pay | Admitting: Dietician

## 2022-11-02 ENCOUNTER — Encounter: Payer: Medicare Other | Attending: Family Medicine | Admitting: Dietician

## 2022-11-02 VITALS — Ht 62.75 in | Wt 214.0 lb

## 2022-11-02 DIAGNOSIS — E118 Type 2 diabetes mellitus with unspecified complications: Secondary | ICD-10-CM | POA: Diagnosis present

## 2022-11-02 NOTE — Patient Instructions (Addendum)
Speak to your doctor about exercise.  Are you allowed to use your stationary bike?  Aim for 30 minutes most days of some form of physical activity as tolerated and allowed.  Plan:  Aim for 3 Carb Choices per meal (45 grams) +/- 1 either way  Aim for 0-1 Carbs per snack if hungry  Include protein in moderation with your meals and snacks Consider reading food labels for Total Carbohydrate of foods Consider  increasing your activity level by biking for 15 minutes daily as tolerated Consider checking BG at alternate times per day  Consider taking medication as directed by MD

## 2022-11-02 NOTE — Progress Notes (Signed)
Diabetes Self-Management Education  Visit Type: First/Initial  Appt. Start Time: 0910 Appt. End Time: 1025  11/02/2022  Ms. [facility], identified by name and date of birth, is a 67 y.o. female with a diagnosis of Diabetes: Type 2.   ASSESSMENT Patient is here today alone. First of 3 Employee visits. Patient would like to learn about healthy eating for her diabetes and kidneys.  She states that she has had diabetes for a long time but initially was in denial. Fasting blood glucose this am 212.  Referral:  Type 2 Diabetes with CKD History includes:  Type 2 Diabetes, HTN, HLD, CKD stage 2 with proteinuria, GERD, OSA, history of breast cancer, anemia Medication includes:  Ozempic (0.5), Farxiga (stopped due to frequent UTI's), vitmain D3, vitamin B-12 (sublingual) Labs include:  A1C 8.7% 08/07/2020 (none current and does not know current), BUN 15, Creatine 0.96, Potassium 4.1, eGFR 65, Phosphorous 4.3, magnesium 1.8, negative protein in urine on 08/25/22 Sleep:  poor (2-5 hours per night)  62.75" 214 lbs 11/02/2022 219 lbs 08/25/2022  Patient lives with his husband and ask. 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 Allergy:  walnuts, pecans, shellfish.  Milk causes post nasal drip to feel worse. Walks with a cane and has knee problems.  She has a stationary bike but has not used this recently and is not sure if she should.  Height 5' 2.75" (1.594 m), weight 214 lb (97.1 kg). Body mass index is 38.21 kg/m.   Diabetes Self-Management Education - 11/02/22 0823       Visit Information   Visit Type First/Initial      Initial Visit   Diabetes Type Type 2    Date Diagnosed --   years ago (before 2015)   Are you currently following a meal plan? No    Are you taking your medications as prescribed? Yes      Health Coping   How would you rate your overall health? Fair      Psychosocial Assessment   Patient Belief/Attitude about Diabetes  Motivated to manage diabetes    What is the hardest part about your diabetes right now, causing you the most concern, or is the most worrisome to you about your diabetes?   Making healty food and beverage choices;Being active    Self-care barriers None    Self-management support Doctor's office    Other persons present Patient    Patient Concerns Nutrition/Meal planning;Healthy Lifestyle;Weight Control    Special Needs None    Preferred Learning Style No preference indicated    Learning Readiness Ready    How often do you need to have someone help you when you read instructions, pamphlets, or other written materials from your doctor or pharmacy? 1 - Never    What is the last grade level you completed in school? 4 years college      Pre-Education Assessment   Patient understands the diabetes disease and treatment process. Needs Instruction    Patient understands incorporating nutritional management into lifestyle. Needs Instruction    Patient undertands incorporating physical activity into lifestyle. Needs Instruction    Patient understands using medications safely. Needs Instruction    Patient understands monitoring blood glucose, interpreting and using results Needs Instruction    Patient understands prevention, detection, and treatment of acute complications. Needs Instruction    Patient understands prevention, detection, and treatment of chronic complications. Needs Instruction    Patient understands how to develop strategies to address psychosocial  issues. Needs Instruction    Patient understands how to develop strategies to promote health/change behavior. Needs Instruction      Complications   Last HgB A1C per patient/outside source 8.7 %   08/2020   How often do you check your blood sugar? --   occasional   Have you had a dilated eye exam in the past 12 months? Yes    Have you had a dental exam in the past 12 months? Yes    Are you checking your feet? No      Dietary Intake    Breakfast skips OR pimento cheese or [place] sandwich OR fried egg and toast    Snack (morning) occasional NABS OR fruit if skips breakfast    Lunch BBQ sandwich, occasional chips or popcorn    Snack (afternoon) regular werther's hard candy    Dinner pig feet, pintos, greens, yams, key lime pie, ice cream    Snack (evening) regular werther's hard candy or ice cream or funyons, famous amos chocolate cookies    Beverage(s) water, occasional 1% milk, occasional sweet tea, occasional fruit punch      Activity / Exercise   Activity / Exercise Type ADL's      Patient Education   Previous Diabetes Education No    Disease Pathophysiology Definition of diabetes, type 1 and 2, and the diagnosis of diabetes    Healthy Eating Role of diet in the treatment of diabetes and the relationship between the three main macronutrients and blood glucose level;Food label reading, portion sizes and measuring food.;Plate Method;Meal options for control of blood glucose level and chronic complications.    Being Active Role of exercise on diabetes management, blood pressure control and cardiac health.;Helped patient identify appropriate exercises in relation to his/her diabetes, diabetes complications and other health issue.    Medications Reviewed patients medication for diabetes, action, purpose, timing of dose and side effects.    Monitoring Identified appropriate SMBG and/or A1C goals.;Daily foot exams;Yearly dilated eye exam    Acute complications Taught prevention, symptoms, and  treatment of hypoglycemia - the 15 rule.;Discussed and identified patients' prevention, symptoms, and treatment of hyperglycemia.    Chronic complications Relationship between chronic complications and blood glucose control    Diabetes Stress and Support Identified and addressed patients feelings and concerns about diabetes;Role of stress on diabetes;Worked with patient to identify barriers to care and solutions      Individualized Goals  (developed by patient)   Nutrition General guidelines for healthy choices and portions discussed    Physical Activity Exercise 3-5 times per week;15 minutes per day    Medications take my medication as prescribed    Monitoring  Test my blood glucose as discussed    Problem Solving Eating Pattern;Addressing barriers to behavior change    Reducing Risk examine blood glucose patterns;treat hypoglycemia with 15 grams of carbs if blood glucose less than 70mg /dL;do foot checks daily      Post-Education Assessment   Patient understands the diabetes disease and treatment process. Comprehends key points    Patient understands incorporating nutritional management into lifestyle. Comprehends key points    Patient undertands incorporating physical activity into lifestyle. Comprehends key points    Patient understands using medications safely. Comphrehends key points    Patient understands monitoring blood glucose, interpreting and using results Comprehends key points    Patient understands prevention, detection, and treatment of acute complications. Comprehends key points    Patient understands prevention, detection, and treatment of chronic complications. Comprehends  key points    Patient understands how to develop strategies to address psychosocial issues. Comprehends key points    Patient understands how to develop strategies to promote health/change behavior. Needs Review      Outcomes   Expected Outcomes Demonstrated interest in learning. Expect positive outcomes    Future DMSE 3-4 months    Program Status Not Completed             Individualized Plan for Diabetes Self-Management Training:   Learning Objective:  Patient will have a greater understanding of diabetes self-management. Patient education plan is to attend individual and/or group sessions per assessed needs and concerns.   Plan:   Patient Instructions  Speak to your doctor about exercise.  Are you allowed to use your  stationary bike?  Aim for 30 minutes most days of some form of physical activity as tolerated and allowed.  Plan:  Aim for 3 Carb Choices per meal (45 grams) +/- 1 either way  Aim for 0-1 Carbs per snack if hungry  Include protein in moderation with your meals and snacks Consider reading food labels for Total Carbohydrate of foods Consider  increasing your activity level by biking for 15 minutes daily as tolerated Consider checking BG at alternate times per day  Consider taking medication as directed by MD     Expected Outcomes:  Demonstrated interest in learning. Expect positive outcomes  Education material provided: ADA - How to Thrive: A Guide for Your Journey with Diabetes, Food label handouts, Meal plan card, Snack sheet, and Diabetes Resources  If problems or questions, patient to contact team via:  Phone  Future DSME appointment: 3-4 months

## 2022-11-09 NOTE — Therapy (Signed)
OUTPATIENT PHYSICAL THERAPY THORACOLUMBAR EVALUATION   Patient Name: Tracy Garrett MRN: [id number] DOB:[birth year],66 y.o., female Today's Date: 11/11/2022   END OF SESSION:  PT End of Session - 11/10/22 1357     Visit Number 1    Number of Visits 7    Date for PT Re-Evaluation 12/25/22    Authorization Type U[employer] MCR    PT Start Time 1400    PT Stop Time 1442    PT Time Calculation (min) 42 min    Activity Tolerance Patient tolerated treatment well    Behavior During Therapy WFL for tasks assessed/performed              Past Medical History:  Diagnosis Date   Allergy    Anemia    Arthritis    Cancer ([name])    right breast cancer   Cataracts, bilateral    Chronic kidney disease    CKD   Family history of breast cancer    Family history of multiple myeloma    Fatty liver disease, nonalcoholic 09/02/2014   [place] appears normal on follow up US 09/2019   GERD (gastroesophageal reflux disease)    History of kidney stones    x2 episodes   Hyperlipidemia    Hypertension    PONV (postoperative nausea and vomiting)    Sleep apnea    test was dx, as mild- no further tx. required   Type II or unspecified type diabetes mellitus without mention of complication, not stated as uncontrolled    Past Surgical History:  Procedure Laterality Date   BREAST SURGERY  1999   Reduction Mammoplasties   CHOLECYSTECTOMY  2003   colonoscopsy  11/2007   neg- polyp removal with both procedures x2   CYSTOSCOPY     surgery to extract stone x 1 with stent   EUS N/A 07/26/2014   Procedure: UPPER ENDOSCOPIC ULTRASOUND (EUS) LINEAR;  Surgeon: Theda Belfast, MD;  Location: [facility];  Service: Endoscopy;  Laterality: N/A;   LAPAROTOMY N/A 10/10/2014   Procedure: 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;  Surgeon: Avel Peace, MD;  Location: [facility];  Service: General;  Laterality: N/A;   LYSIS OF ADHESION  10/10/2014   Procedure: LYSIS OF ADHESION;  Surgeon: Avel Peace, MD;  Location: [facility];  Service: General;;   MASTECTOMY W/ SENTINEL NODE BIOPSY Bilateral 01/02/2020   Procedure: BILATERAL MASTECTOMY WITH RIGHT SENTINEL LYMPH NODE MAPPING;  Surgeon: Harriette Bouillon, MD;  Location: [facility] OR;  Service: General;  Laterality: Bilateral;  PEC BLOCK   PORTACATH PLACEMENT Right 01/02/2020   Procedure: INSERTION PORT-A-CATH WITH ULTRASOUND GUIDANCE;  Surgeon: Harriette Bouillon, MD;  Location: [facility] OR;  Service: General;  Laterality: Right;   TONSILLECTOMY     and adenoids removed age 52   VAGINAL HYSTERECTOMY  2002   Patient Active Problem List   Diagnosis Date Noted   Encounter for antineoplastic chemotherapy 02/18/2020   Port-A-Cath in place 02/04/2020   Genetic testing 12/17/2019   Family history of breast cancer    Family history of multiple myeloma    Malignant neoplasm of upper-outer quadrant of right breast in female, estrogen receptor positive ([name]) 11/22/2019   Cyst of left kidney 09/13/2019   Proteinuria 07/18/2019   Acid reflux 07/16/2019   History of COVID-19 07/16/2019   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) 01/03/2019   Poorly controlled type 2 diabetes mellitus ([name]) 01/03/2019   Morbid obesity ([name]) 07/04/2017   Vitamin  D deficiency 09/02/2014   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   Essential hypertension 08/31/2013   Medication management 08/31/2013   Hyperlipidemia associated with type 2 diabetes mellitus ([name])     PCP: Leilani Able, MD   REFERRING PROVIDER: Cassandria Anger, PA-C   REFERRING DIAG: [phone] (ICD-10-CM) - Myalgia, other site   Rationale for Evaluation and Treatment: Rehabilitation  THERAPY DIAG:  Chronic low back pain with left-sided sciatica, unspecified back pain laterality  Muscle weakness (generalized)  ONSET DATE: acute on chronic recent worsening May 2024   SUBJECTIVE:                                                                                                                                                                                            SUBJECTIVE STATEMENT: Patient reports pain off/on along the low back and LLE for a couple years with recent worsening in May 2024 without known cause. She was prescribed prednisone and this has really helped with her pain. She has not experienced pain since taking this medication. She reports the pain was in her posterior hip and radiated down the side of the LLE to her foot. She reports an increase in pain when she would lay down on the Rt side. She denies any changes in bowel/bladder or saddle paraesthesia. She would like to figure out the cause of her pain and find exercises that will help to manage her pain as she does not want to rely on medication when she has flare ups of pain.   PERTINENT HISTORY:  History of Rt breast cancer Arthritis  Diabetes  CKD PAIN:  Are you having pain?none currently Yes: NPRS scale: 10 (at worst)/10 Pain location: low back/LLE Pain description: sharp Aggravating factors: laying on the Rt side Relieving factors: medication  PRECAUTIONS: None  WEIGHT BEARING RESTRICTIONS: No  FALLS:  Has patient fallen in last 6 months? No  LIVING ENVIRONMENT: Lives with: lives with their family Lives in: House/apartment Stairs: No Has following equipment at home: Single point cane  OCCUPATION: part time for [employer]   PLOF: Independent  PATIENT GOALS: "learn what I can do to prevent it."    OBJECTIVE:   DIAGNOSTIC FINDINGS:  None on file   PATIENT SURVEYS:  FOTO 54% function to 66% predicted   SCREENING FOR RED FLAGS: Bowel or bladder incontinence: No Cauda equina syndrome: No   COGNITION: Overall cognitive status: Within functional limits for tasks assessed     SENSATION: WFL  MUSCLE LENGTH: Hamstrings: WNL Piriformis: moderate tightness bilaterally   POSTURE: rounded shoulders, forward head, and decreased lumbar lordosis  PALPATION: Tautness and palpable  tenderness Lt gluteals, piriformis   LUMBAR ROM:   AROM eval  Flexion 10% limited   Extension 25% limited   Right lateral flexion 25% limited  Left lateral flexion 25% limited   Right rotation WNL  Left rotation WNL   (Blank rows = not tested)  LOWER EXTREMITY ROM:     Active  Right eval Left eval  Hip flexion    Hip extension    Hip abduction    Hip adduction    Hip internal rotation    Hip external rotation    Knee flexion    Knee extension    Ankle dorsiflexion    Ankle plantarflexion    Ankle inversion    Ankle eversion     (Blank rows = not tested)  LOWER EXTREMITY MMT:    MMT Right eval Left eval  Hip flexion 4 4  Hip extension 3+ 3+  Hip abduction 4- 4-  Hip adduction    Hip internal rotation    Hip external rotation    Knee flexion 5 5  Knee extension 5 5  Ankle dorsiflexion 5 5  Ankle plantarflexion 5 5  Ankle inversion 5 5  Ankle eversion 5 5   (Blank rows = not tested)  LUMBAR SPECIAL TESTS:  (-) SLR (+) [place] bilaterally   FUNCTIONAL TESTS:  5 x STS: 20.9 seconds   GAIT: Distance walked: 10 ft Assistive device utilized: Single point cane Level of assistance: Modified independence Comments: trendelenburg   [facility] Adult PT Treatment:                                                DATE: 11/10/22  Therapeutic Exercise: Demonstrated and issued initial HEP.    Therapeutic Activity: Education on assessment findings that will be addressed throughout duration of POC.      PATIENT EDUCATION:  Education details: see treatment Person educated: Patient Education method: Explanation, Demonstration, Tactile cues, Verbal cues, and Handouts Education comprehension: verbalized understanding, returned demonstration, verbal cues required, tactile cues required, and needs further education  HOME EXERCISE PROGRAM: Access Code: [email or web address] URL: [email or web address] Date: 11/10/2022 Prepared by: Letitia Libra  Exercises - Supine  Figure 4 Piriformis Stretch  - 1 x daily - 7 x weekly - 3 sets - 30  hold - Supine Sciatic Nerve Glide  - 1 x daily - 7 x weekly - 1 sets - 10 reps - Hooklying Clamshell with Resistance  - 1 x daily - 7 x weekly - 2 sets - 10 reps - Supine March  - 1 x daily - 7 x weekly - 2 sets - 10 reps  ASSESSMENT:  CLINICAL IMPRESSION: Patient is a 67 y.o. female who was seen today for physical therapy evaluation and treatment for acute on chronic low back/LLE pain. She was recently prescribed prednisone with patient reporting resolution of her pain since taking this medication. Upon assessment she is noted to have bilateral hip weakness and hip musculature tightness and slightly limited trunk ROM. She will benefit from skilled PT to address her strength and mobility deficits and train in home program that she can continue independently to manage her chronic pain.   OBJECTIVE IMPAIRMENTS: Abnormal gait, decreased activity tolerance, decreased knowledge of condition, difficulty walking, decreased ROM, decreased strength, increased fascial restrictions, impaired flexibility, improper body mechanics, postural dysfunction, and pain.   ACTIVITY LIMITATIONS: carrying,  lifting, bending, sitting, sleeping, transfers, and locomotion level  PARTICIPATION LIMITATIONS: meal prep, cleaning, laundry, driving, shopping, and community activity  PERSONAL FACTORS: Age, Fitness, Profession, Time since onset of injury/illness/exacerbation, and 3+ comorbidities: see PMH above  are also affecting patient's functional outcome.   REHAB POTENTIAL: Good  CLINICAL DECISION MAKING: Stable/uncomplicated  EVALUATION COMPLEXITY: Low   GOALS: Goals reviewed with patient? Yes  SHORT TERM GOALS: Target date: 12/01/2022    Patient will be independent and compliant with initial HEP.   Baseline: see above Goal status: INITIAL  2.  Patient will complete 5 x STS in </=15 seconds to improve functional strength.  Baseline: see  above  Goal status: INITIAL   LONG TERM GOALS: Target date: 12/25/22  Patient will score at least 66% on FOTO to signify clinically meaningful improvement in functional abilities.   Baseline: see above Goal status: INITIAL  2.  Patient will be independent with advanced home program to assist in management of her chronic condition.  Baseline: see above Goal status: INITIAL  3.  Patient will demonstrate 4+/5 bilateral hip strength to improve stability about the chain with prolonged walking/standing activity.  Baseline: see above Goal status: INITIAL   PLAN:  PT FREQUENCY: 1x/week  PT DURATION: 6 weeks  PLANNED INTERVENTIONS: Therapeutic exercises, Therapeutic activity, Neuromuscular re-education, Balance training, Gait training, Patient/Family education, Self Care, Joint mobilization, Aquatic Therapy, Dry Needling, Cryotherapy, Moist heat, Manual therapy, and Re-evaluation.  PLAN FOR NEXT SESSION: review and progress HEP prn; hip/core strengthening. Manual to Lt hip.   Letitia Libra, PT, DPT, ATC 11/11/22 8:58 AM

## 2022-11-10 ENCOUNTER — Other Ambulatory Visit: Payer: Self-pay

## 2022-11-10 ENCOUNTER — Ambulatory Visit: Payer: Medicare Other | Attending: Student

## 2022-11-10 DIAGNOSIS — G8929 Other chronic pain: Secondary | ICD-10-CM | POA: Diagnosis present

## 2022-11-10 DIAGNOSIS — M6281 Muscle weakness (generalized): Secondary | ICD-10-CM | POA: Insufficient documentation

## 2022-11-10 DIAGNOSIS — M5442 Lumbago with sciatica, left side: Secondary | ICD-10-CM | POA: Insufficient documentation

## 2022-11-12 ENCOUNTER — Encounter (HOSPITAL_BASED_OUTPATIENT_CLINIC_OR_DEPARTMENT_OTHER): Payer: Self-pay | Admitting: Emergency Medicine

## 2022-11-12 ENCOUNTER — Emergency Department (HOSPITAL_BASED_OUTPATIENT_CLINIC_OR_DEPARTMENT_OTHER)
Admission: EM | Admit: 2022-11-12 | Discharge: 2022-11-12 | Disposition: A | Discharge status: Home / Self Care | Payer: Medicare Other | Attending: Emergency Medicine | Admitting: Emergency Medicine

## 2022-11-12 ENCOUNTER — Other Ambulatory Visit: Payer: Self-pay

## 2022-11-12 DIAGNOSIS — L089 Local infection of the skin and subcutaneous tissue, unspecified: Secondary | ICD-10-CM

## 2022-11-12 DIAGNOSIS — L720 Epidermal cyst: Secondary | ICD-10-CM | POA: Insufficient documentation

## 2022-11-12 DIAGNOSIS — L02415 Cutaneous abscess of right lower limb: Secondary | ICD-10-CM | POA: Diagnosis present

## 2022-11-12 DIAGNOSIS — E1165 Type 2 diabetes mellitus with hyperglycemia: Secondary | ICD-10-CM | POA: Diagnosis not present

## 2022-11-12 LAB — CBG MONITORING, ED: Glucose-Capillary: 295 mg/dL — ABNORMAL HIGH (ref 70–99)

## 2022-11-12 MED ORDER — KETOCONAZOLE 2 % EX CREA: 1.0000 | TOPICAL_CREAM | Freq: Every day | CUTANEOUS | 0 refills | Status: DC

## 2022-11-12 MED ORDER — LIDOCAINE-EPINEPHRINE (PF) 2 %-1:200000 IJ SOLN
10.0000 mL | Freq: Once | INTRAMUSCULAR | Status: AC
  Administered 2022-11-12: 10 mL
  Filled 2022-11-12: qty 20

## 2022-11-12 MED ORDER — DOXYCYCLINE HYCLATE 100 MG PO CAPS: 100.0000 mg | ORAL_CAPSULE | Freq: Two times a day (BID) | ORAL | 0 refills | Status: AC

## 2022-11-12 NOTE — Discharge Instructions (Addendum)
Your blood sugar was 295 today. It may be due to the skin infection. He will need to follow-up with your primary care physician about your high blood sugars.  Please make sure that you are checking your blood sugars regularly and following a healthy diet for diabetics to reduce your blood sugar level.  Please take the antibiotic I have ordered with food twice daily for 1 week.  Make sure that when you go out in the sun you are taking precautions to cover your skin as it can make you burn more easily. Make sure that you take this antibiotic with a full glass of water as well. Apply the ketoconazole cream under your abdominal fold once daily for 14 days. WOUND CARE   Keep area clean and dry for 24 hours. Do not remove bandage, if applied.  After 24 hours, remove bandage and wash wound gently with mild soap and warm water. Reapply a new bandage after cleaning wound, if directed.  Continue daily cleansing with soap and water until stitches/staples are removed. DO NOT APPLY THE ANTIFUNGAL CREAM TO THE OPEN WOUND   Seek medical careif you experience any of the following signs of infection: Swelling, redness, pus drainage, streaking, fever >101.0 F  Seek care if you experience excessive bleeding that does not stop after 15-20 minutes of constant, firm pressure.

## 2022-11-12 NOTE — ED Provider Notes (Signed)
[facility]MENT AT [facility] Provider Note   CSN: [id number] Arrival date & time: 11/12/22  1334     History  Chief Complaint  Patient presents with   Abscess    Tracy Garrett is a 67 y.o. female  The history is provided by the patient. No language interpreter was used.  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 Authorizing Provider  doxycycline (VIBRAMYCIN) 100 MG capsule Take 1 capsule (100 mg total) by mouth 2 (two) times daily. 11/12/22  Yes Tej Murdaugh, PA-C  ketoconazole (NIZORAL) 2 % cream Apply 1 Application topically daily. 11/12/22  Yes Giordan Fordham, PA-C  anastrozole (ARIMIDEX) 1 MG tablet Take 1 tablet (1 mg total) by mouth daily. 06/02/22   Rachel Moulds, MD  aspirin EC 81 MG tablet Take 81 mg by mouth daily. Swallow whole.    [provider]  cetirizine (ZYRTEC) 10 MG tablet Take 10 mg by mouth daily. Patient not taking: Reported on 11/02/2022    [provider]  Cholecalciferol (VITAMIN D3) 5000 UNITS TABS Take 5,000 Units by mouth daily.     [provider]  Cranberry 1000 MG CAPS cranberry    [provider]  cyanocobalamin (VITAMIN B12) 1000 MCG tablet Take 1,000 mcg by mouth daily.    [provider]  FARXIGA 10 MG  TABS tablet Take 10 mg by mouth daily. Patient not taking: Reported on 11/02/2022 01/22/22   [provider]  fexofenadine (ALLEGRA) 60 MG tablet Take 60 mg by mouth 2 (two) times daily.    [provider]  glucose blood (CONTOUR NEXT TEST) test strip Check blood sugar 1 time daily-DX-E11.9 10/14/16   Lucky Cowboy, MD  influenza vac split quadrivalent PF (FLUARIX) 0.5 ML injection Inject 0.5 mLs into the muscle. 03/24/21   Judyann Munson, MD  influenza vaccine adjuvanted (FLUAD QUADRIVALENT) 0.5 ML injection Inject 0.5 mLs into the muscle. 02/26/22   Judyann Munson, MD  losartan (COZAAR) 50 MG tablet TAKE 1/2 TO 1 TABLET BY MOUTH  DAILY FOR BLOOD PRESSURE 06/01/22   Adela Glimpse, NP  Omega-3 Fatty Acids (OMEGA 3 PO) Take 1 capsule by mouth 2 (two) times daily. Patient not taking: Reported on 11/02/2022    [provider]  OZEMPIC, 0.25 OR 0.5 MG/DOSE, 2 MG/3ML SOPN SMARTSIG:0.5 Milligram(s) Topical Once a Week 11/27/21   [provider]  pantoprazole (PROTONIX) 40 MG tablet TAKE 1 TABLET DAILY TO PREVENT HEARTBURN & INDIGESTION. 02/12/22   Raynelle Dick, NP  rosuvastatin (CRESTOR) 5 MG tablet TAKE 1 TABLET BY MOUTH EVERY DAY 06/12/21   Judd Gaudier, NP  prochlorperazine (COMPAZINE) 10 MG tablet Take 1 tablet (10 mg total) by mouth every 6 (six) hours as needed (Nausea or  vomiting). 01/14/20 04/30/20  Magrinat, Valentino Hue, MD      Allergies    Pecan nut (diagnostic), Hydromorphone, Other, and Shellfish allergy    Review of Systems   Review of Systems  Constitutional:  Negative for fatigue and fever.  Gastrointestinal:  Negative for anorexia, nausea and vomiting.  Neurological:  Negative for headaches.    Physical Exam Updated Vital Signs BP 125/70 (BP Location: Left Arm)   Pulse 95   Temp 98.4 F (36.9 C) (Oral)   Resp 14   Wt 95.7 kg   SpO2 98%   BMI 37.68 kg/m  Physical Exam Vitals and nursing note reviewed.  Constitutional:      General:  She is not in acute distress.    Appearance: She is well-developed. She is not diaphoretic.  HENT:     Head: Normocephalic and atraumatic.     Right Ear: External ear normal.     Left Ear: External ear normal.     Nose: Nose normal.     Mouth/Throat:     Mouth: Mucous membranes are moist.  Eyes:     General: No scleral icterus.    Conjunctiva/sclera: Conjunctivae normal.  Cardiovascular:     Rate and Rhythm: Normal rate and regular rhythm.     Heart sounds: Normal heart sounds. No murmur heard.    No friction rub. No gallop.  Pulmonary:     Effort: Pulmonary effort is normal. No respiratory distress.     Breath sounds: Normal breath sounds.  Abdominal:     General: Bowel sounds are normal. There is no distension.     Palpations: Abdomen is soft. There is no mass.     Tenderness: There is no abdominal tenderness. There is no guarding.  Musculoskeletal:     Cervical back: Normal range of motion.  Skin:    General: Skin is warm and dry.     Comments: R upper thigh abscess, 5 cm, with central induration, ttp. No induration. Abscess covered by the abdominal panis which has macerated tissue and several small singular erythematous papules.   Neurological:     Mental Status: She is alert and oriented to person, place, and time.  Psychiatric:        Behavior: Behavior normal.     ED Results / Procedures / Treatments   Labs (all labs ordered are listed, but only abnormal results are displayed) Labs Reviewed  CBG MONITORING, ED - Abnormal; Notable for the following components:      Result Value   Glucose-Capillary 295 (*)    All other components within normal limits    EKG None  Radiology No results found.  Procedures .Marland KitchenIncision and Drainage  Date/Time: 11/12/2022 2:19 PM  Performed by: Arthor Captain, PA-C Authorized by: Arthor Captain, PA-C   Consent:    Consent obtained:  Verbal   Consent given by:  Patient   Risks discussed:  Bleeding, incomplete drainage, pain  and damage to other organs   Alternatives discussed:  No treatment Universal protocol:    Procedure explained and questions answered to patient or proxy's satisfaction: yes     Relevant documents present and verified: yes     Test results available : yes     Imaging studies available: yes     Required blood products, implants, devices, and special equipment available: yes     Site/side marked: yes     Immediately prior to procedure, a time out was called: yes     Patient identity confirmed:  Verbally with patient Location:    Type:  Abscess   Size:  5cm   Location:  Lower extremity   Lower extremity location: R upper thigh. Pre-procedure details:    Skin preparation:  Betadine Sedation:    Sedation type:  None Anesthesia:    Anesthesia method:  Local infiltration   Local anesthetic:  Lidocaine 1% WITH epi Procedure type:    Complexity:  Complex Procedure details:    Incision types:  Single straight   Incision depth:  Subcutaneous   Wound management:  Probed and deloculated, irrigated with saline and extensive cleaning   Drainage:  Purulent   Drainage amount:  Moderate   Packing materials:  1/4 in gauze Post-procedure details:    Procedure completion:  Tolerated well, no immediate complications     Medications Ordered in ED Medications  lidocaine-EPINEPHrine (XYLOCAINE W/EPI) 2 %-1:200000 (PF) injection 10 mL (10 mLs Infiltration Given 11/12/22 1427)    ED Course/ Medical Decision Making/ A&P Clinical Course as of 11/12/22 1510  Fri Nov 12, 2022  1502 Glucose-Capillary(!): 295 [AH]    Clinical Course User Index [AH] Arthor Captain, PA-C                             Medical Decision Making Amount and/or Complexity of Data Reviewed Labs:  Decision-making details documented in ED Course.  Risk Prescription drug management.   Tracy Garrett presents with abscess.  This to be an infected epidermal cyst. There is no area of retained pus after procedure. The  presentation of Tracy Garrett is NOT consistent with necrotizing fascitis or osteomyolitis. There is no evidence of retained foreign body, neurovascular or tendon injury. The presentation of Tracy Garrett is NOT consistent with sepsis and/or bacteremia. Tracy Garrett meets outpatient criteria for treatment with antibiotics including doxycycline due to history of MRSA positive cultures at previous admissions.     Strict return and follow-up precautions have been given by me personally or by detailed written instructions verbalized by nursing staff using the teach back method to the patient/family/caregiver(s).  Data Reviewed/Counseling: I have reviewed the patient's vital signs, nursing notes, and other relevant tests/information. I had a detailed discussion regarding the historical points, exam findings, and any diagnostic results supporting the discharge diagnosis. I also discussed the need for outpatient follow-up and the need to return to the ED if symptoms worsen or if there are any questions or concerns that arise at home.        Final Clinical Impression(s) / ED Diagnoses Final diagnoses:  Infected epidermoid cyst  Hyperglycemia due to diabetes mellitus ([name])    Rx / DC Orders ED Discharge Orders          Ordered    doxycycline (VIBRAMYCIN) 100 MG capsule  2 times daily        11/12/22 1421    ketoconazole (NIZORAL) 2 % cream  Daily        11/12/22 1421              Arthor Captain, PA-C 11/12/22 1636    Melene Plan, DO 11/15/22 [phone]

## 2022-11-12 NOTE — ED Triage Notes (Signed)
Pt arrives pov, steady gait with c/o abscess to RT side groin and concern for insect bite x 1.5 weeks.

## 2022-12-03 ENCOUNTER — Ambulatory Visit: Payer: Medicare Other | Admitting: Physical Therapy

## 2022-12-03 ENCOUNTER — Encounter: Payer: Self-pay | Admitting: Physical Therapy

## 2022-12-03 DIAGNOSIS — G8929 Other chronic pain: Secondary | ICD-10-CM

## 2022-12-03 DIAGNOSIS — M5442 Lumbago with sciatica, left side: Secondary | ICD-10-CM | POA: Diagnosis not present

## 2022-12-03 DIAGNOSIS — M6281 Muscle weakness (generalized): Secondary | ICD-10-CM

## 2022-12-03 NOTE — Therapy (Signed)
OUTPATIENT PHYSICAL THERAPY THORACOLUMBAR EVALUATION   Patient Name: Tracy Garrett MRN: [id number] DOB:March 12, 1956,67 y.o., female Today's Date: 12/03/2022   END OF SESSION:  PT End of Session - 12/03/22 1318     Visit Number 2    Number of Visits 7    Date for PT Re-Evaluation 12/25/22    Authorization Type [employer] MCR    PT Start Time 1316    PT Stop Time 1355    PT Time Calculation (min) 39 min              Past Medical History:  Diagnosis Date   Allergy    Anemia    Arthritis    Cancer ([name])    right breast cancer   Cataracts, bilateral    Chronic kidney disease    CKD   Family history of breast cancer    Family history of multiple myeloma    Fatty liver disease, nonalcoholic 09/02/2014   [place] appears normal on follow up US 09/2019   GERD (gastroesophageal reflux disease)    History of kidney stones    x2 episodes   Hyperlipidemia    Hypertension    PONV (postoperative nausea and vomiting)    Sleep apnea    test was dx, as mild- no further tx. required   Type II or unspecified type diabetes mellitus without mention of complication, not stated as uncontrolled    Past Surgical History:  Procedure Laterality Date   BREAST SURGERY  1999   Reduction Mammoplasties   CHOLECYSTECTOMY  2003   colonoscopsy  11/2007   neg- polyp removal with both procedures x2   CYSTOSCOPY     surgery to extract stone x 1 with stent   EUS N/A 07/26/2014   Procedure: UPPER ENDOSCOPIC ULTRASOUND (EUS) LINEAR;  Surgeon: Theda Belfast, MD;  Location: [facility] END[facility];  Service: Endoscopy;  Laterality: N/A;   LAPAROTOMY N/A 10/10/2014   Procedure: 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;  Surgeon: Avel Peace, MD;  Location: [facility] ORS;  Service: General;  Laterality: N/A;   LYSIS OF ADHESION  10/10/2014   Procedure: LYSIS OF ADHESION;  Surgeon: Avel Peace, MD;  Location: [facility] ORS;  Service: General;;   MASTECTOMY W/ SENTINEL NODE BIOPSY Bilateral 01/02/2020    Procedure: BILATERAL MASTECTOMY WITH RIGHT SENTINEL LYMPH NODE MAPPING;  Surgeon: Harriette Bouillon, MD;  Location: [facility] OR;  Service: General;  Laterality: Bilateral;  PEC BLOCK   PORTACATH PLACEMENT Right 01/02/2020   Procedure: INSERTION PORT-A-CATH WITH ULTRASOUND GUIDANCE;  Surgeon: Harriette Bouillon, MD;  Location: [facility] OR;  Service: General;  Laterality: Right;   TONSILLECTOMY     and adenoids removed age 47   VAGINAL HYSTERECTOMY  2002   Patient Active Problem List   Diagnosis Date Noted   Encounter for antineoplastic chemotherapy 02/18/2020   Port-A-Cath in place 02/04/2020   Genetic testing 12/17/2019   Family history of breast cancer    Family history of multiple myeloma    Malignant neoplasm of upper-outer quadrant of right breast in female, estrogen receptor positive ([name]) 11/22/2019   Cyst of left kidney 09/13/2019   Proteinuria 07/18/2019   Acid reflux 07/16/2019   History of COVID-19 07/16/2019   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) 01/03/2019   Poorly controlled type 2 diabetes mellitus ([name]) 01/03/2019   Morbid obesity ([name]) 07/04/2017   Vitamin D deficiency 09/02/2014   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   Essential hypertension 08/31/2013   Medication  management 08/31/2013   Hyperlipidemia associated with type 2 diabetes mellitus ([name])     PCP: Leilani Able, MD   REFERRING PROVIDER: Cassandria Anger, PA-C   REFERRING DIAG: [phone] (ICD-10-CM) - Myalgia, other site   Rationale for Evaluation and Treatment: Rehabilitation  THERAPY DIAG:  Muscle weakness (generalized)  Chronic low back pain with left-sided sciatica, unspecified back pain laterality  ONSET DATE: acute on chronic recent worsening May 2024   SUBJECTIVE:                                                                                                                                                                                           SUBJECTIVE STATEMENT: Pt arrives  reporting no pain. Absent for 3 weeks due to scheduling and also recovering from an abscess on right thigh. Has completed HEP 1 x (last week).     EVAL: Patient reports pain off/on along the low back and LLE for a couple years with recent worsening in May 2024 without known cause. She was prescribed prednisone and this has really helped with her pain. She has not experienced pain since taking this medication. She reports the pain was in her posterior hip and radiated down the side of the LLE to her foot. She reports an increase in pain when she would lay down on the Rt side. She denies any changes in bowel/bladder or saddle paraesthesia. She would like to figure out the cause of her pain and find exercises that will help to manage her pain as she does not want to rely on medication when she has flare ups of pain.   PERTINENT HISTORY:  History of Rt breast cancer Arthritis  Diabetes  CKD PAIN:  Are you having pain?none currently Yes: NPRS scale: 0 (at worst)/10 Pain location: low back/LLE Pain description: sharp Aggravating factors: laying on the Rt side Relieving factors: medication  PRECAUTIONS: None  WEIGHT BEARING RESTRICTIONS: No  FALLS:  Has patient fallen in last 6 months? No  LIVING ENVIRONMENT: Lives with: lives with their family Lives in: House/apartment Stairs: No Has following equipment at home: Single point cane  OCCUPATION: part time for [employer]   PLOF: Independent  PATIENT GOALS: "learn what I can do to prevent it."    OBJECTIVE:   DIAGNOSTIC FINDINGS:  None on file   PATIENT SURVEYS:  FOTO 54% function to 66% predicted   SCREENING FOR RED FLAGS: Bowel or bladder incontinence: No Cauda equina syndrome: No   COGNITION: Overall cognitive status: Within functional limits for tasks assessed     SENSATION: WFL  MUSCLE LENGTH: Hamstrings: WNL Piriformis: moderate tightness bilaterally  POSTURE: rounded shoulders, forward head, and decreased  lumbar lordosis  PALPATION: Tautness and palpable tenderness Lt gluteals, piriformis   LUMBAR ROM:   AROM eval  Flexion 10% limited   Extension 25% limited   Right lateral flexion 25% limited  Left lateral flexion 25% limited   Right rotation WNL  Left rotation WNL   (Blank rows = not tested)  LOWER EXTREMITY ROM:     Active  Right eval Left eval  Hip flexion    Hip extension    Hip abduction    Hip adduction    Hip internal rotation    Hip external rotation    Knee flexion    Knee extension    Ankle dorsiflexion    Ankle plantarflexion    Ankle inversion    Ankle eversion     (Blank rows = not tested)  LOWER EXTREMITY MMT:    MMT Right eval Left eval  Hip flexion 4 4  Hip extension 3+ 3+  Hip abduction 4- 4-  Hip adduction    Hip internal rotation    Hip external rotation    Knee flexion 5 5  Knee extension 5 5  Ankle dorsiflexion 5 5  Ankle plantarflexion 5 5  Ankle inversion 5 5  Ankle eversion 5 5   (Blank rows = not tested)  LUMBAR SPECIAL TESTS:  (-) SLR (+) [place] bilaterally   FUNCTIONAL TESTS:  5 x STS: 20.9 seconds   GAIT: Distance walked: 10 ft Assistive device utilized: Single point cane Level of assistance: Modified independence Comments: trendelenburg   [facility] Adult PT Treatment:                                                DATE: 12/03/22 Therapeutic Exercise: Supine March  10 x 2  Nerve glide Left  x 10 left  Supine clam blue 10 x 2  Side hip abdct x 10 each- difficult on left  Side clam AROM x 10 each Standing hip abdct  x 10 each Standing hip ext x 10 each  Standing march x 10 each  Supine figure 4 push and pull x 1 each  STS x 10 UPDATED HEP     [facility] Adult PT Treatment:                                                DATE: 11/10/22  Therapeutic Exercise: Demonstrated and issued initial HEP.    Therapeutic Activity: Education on assessment findings that will be addressed throughout duration of POC.      PATIENT  EDUCATION:  Education details: see treatment Person educated: Patient Education method: Explanation, Demonstration, Tactile cues, Verbal cues, and Handouts Education comprehension: verbalized understanding, returned demonstration, verbal cues required, tactile cues required, and needs further education  HOME EXERCISE PROGRAM: Access Code: [email or web address] URL: [email or web address] Date: 11/10/2022 Prepared by: Letitia Libra  Exercises - Supine Figure 4 Piriformis Stretch  - 1 x daily - 7 x weekly - 3 sets - 30  hold - Supine Sciatic Nerve Glide  - 1 x daily - 7 x weekly - 1 sets - 10 reps - Hooklying Clamshell with Resistance  - 1 x daily - 7 x weekly - 2 sets -  10 reps - Supine March  - 1 x daily - 7 x weekly - 2 sets - 10 reps  - Standing Hip Abduction with Counter Support  - 1 x daily - 7 x weekly - 2 sets - 10 reps - Standing Hip Extension with Counter Support  - 1 x daily - 7 x weekly - 2 sets - 10 reps - Supine Bridge  - 1 x daily - 7 x weekly - 2 sets - 10 reps - 5 hold - Sit to stand with control  - 1 x daily - 7 x weekly - 2 sets - 10 reps  ASSESSMENT:  CLINICAL IMPRESSION: Patient is a 68 y.o. female who was seen today for physical therapy treatment for acute on chronic low back/LLE pain. She was last seen here 3 weeks ago and has since been treated in the ED for a right thigh abscess. Her pain has not returned since she was given prednisone from MD prior to starting PT. She just started HEP last week and has completed it one time. She reported feeling soreness in her left hip and leg afterward that resolved. Today we reviewed HEP and progressed core/hip strength. Pt was receptive to exercises she could do at work so she was given standing hip exercises. Will assess tolerance at next session and progress prn.      EVAL: She was recently prescribed prednisone with patient reporting resolution of her pain since taking this medication. Upon assessment she is noted to have  bilateral hip weakness and hip musculature tightness and slightly limited trunk ROM. She will benefit from skilled PT to address her strength and mobility deficits and train in home program that she can continue independently to manage her chronic pain.   OBJECTIVE IMPAIRMENTS: Abnormal gait, decreased activity tolerance, decreased knowledge of condition, difficulty walking, decreased ROM, decreased strength, increased fascial restrictions, impaired flexibility, improper body mechanics, postural dysfunction, and pain.   ACTIVITY LIMITATIONS: carrying, lifting, bending, sitting, sleeping, transfers, and locomotion level  PARTICIPATION LIMITATIONS: meal prep, cleaning, laundry, driving, shopping, and community activity  PERSONAL FACTORS: Age, Fitness, Profession, Time since onset of injury/illness/exacerbation, and 3+ comorbidities: see PMH above  are also affecting patient's functional outcome.   REHAB POTENTIAL: Good  CLINICAL DECISION MAKING: Stable/uncomplicated  EVALUATION COMPLEXITY: Low   GOALS: Goals reviewed with patient? Yes  SHORT TERM GOALS: Target date: 12/01/2022    Patient will be independent and compliant with initial HEP.   Baseline: see above Goal status: INITIAL  2.  Patient will complete 5 x STS in </=15 seconds to improve functional strength.  Baseline: see above  Goal status: INITIAL   LONG TERM GOALS: Target date: 12/25/22  Patient will score at least 66% on FOTO to signify clinically meaningful improvement in functional abilities.   Baseline: see above Goal status: INITIAL  2.  Patient will be independent with advanced home program to assist in management of her chronic condition.  Baseline: see above Goal status: INITIAL  3.  Patient will demonstrate 4+/5 bilateral hip strength to improve stability about the chain with prolonged walking/standing activity.  Baseline: see above Goal status: INITIAL   PLAN:  PT FREQUENCY: 1x/week  PT DURATION: 6  weeks  PLANNED INTERVENTIONS: Therapeutic exercises, Therapeutic activity, Neuromuscular re-education, Balance training, Gait training, Patient/Family education, Self Care, Joint mobilization, Aquatic Therapy, Dry Needling, Cryotherapy, Moist heat, Manual therapy, and Re-evaluation.  PLAN FOR NEXT SESSION: review and progress HEP prn; hip/core strengthening. Manual to Lt hip.   Shanda Bumps  Lars Mage, PTA 12/03/22 1:58 PM Phone: [phone] Fax: [phone]

## 2022-12-07 ENCOUNTER — Ambulatory Visit: Payer: Medicare Other

## 2022-12-07 DIAGNOSIS — G8929 Other chronic pain: Secondary | ICD-10-CM | POA: Insufficient documentation

## 2022-12-07 DIAGNOSIS — M5442 Lumbago with sciatica, left side: Secondary | ICD-10-CM | POA: Diagnosis present

## 2022-12-07 DIAGNOSIS — M6281 Muscle weakness (generalized): Secondary | ICD-10-CM | POA: Diagnosis present

## 2022-12-07 NOTE — Therapy (Signed)
OUTPATIENT PHYSICAL THERAPY THORACOLUMBAR EVALUATION   Patient Name: Tracy Garrett MRN: [id number] DOB:[birth year],67 y.o., female Today's Date: 12/07/2022   END OF SESSION:  PT End of Session - 12/07/22 1234     Visit Number 3    Number of Visits 7    Date for PT Re-Evaluation 12/25/22    Authorization Type [employer] MCR    PT Start Time 1234    PT Stop Time 1314    PT Time Calculation (min) 40 min    Activity Tolerance Patient tolerated treatment well    Behavior During Therapy WFL for tasks assessed/performed              Past Medical History:  Diagnosis Date   Allergy    Anemia    Arthritis    Cancer ([name])    right breast cancer   Cataracts, bilateral    Chronic kidney disease    CKD   Family history of breast cancer    Family history of multiple myeloma    Fatty liver disease, nonalcoholic 09/02/2014   [place] appears normal on follow up US 09/2019   GERD (gastroesophageal reflux disease)    History of kidney stones    x2 episodes   Hyperlipidemia    Hypertension    PONV (postoperative nausea and vomiting)    Sleep apnea    test was dx, as mild- no further tx. required   Type II or unspecified type diabetes mellitus without mention of complication, not stated as uncontrolled    Past Surgical History:  Procedure Laterality Date   BREAST SURGERY  1999   Reduction Mammoplasties   CHOLECYSTECTOMY  2003   colonoscopsy  11/2007   neg- polyp removal with both procedures x2   CYSTOSCOPY     surgery to extract stone x 1 with stent   EUS N/A 07/26/2014   Procedure: UPPER ENDOSCOPIC ULTRASOUND (EUS) LINEAR;  Surgeon: Theda Belfast, MD;  Location: [facility];  Service: Endoscopy;  Laterality: N/A;   LAPAROTOMY N/A 10/10/2014   Procedure: 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;  Surgeon: Avel Peace, MD;  Location: [facility];  Service: General;  Laterality: N/A;   LYSIS OF ADHESION  10/10/2014   Procedure: LYSIS OF ADHESION;  Surgeon: Avel Peace, MD;  Location: [facility];  Service: General;;   MASTECTOMY W/ SENTINEL NODE BIOPSY Bilateral 01/02/2020   Procedure: BILATERAL MASTECTOMY WITH RIGHT SENTINEL LYMPH NODE MAPPING;  Surgeon: Harriette Bouillon, MD;  Location: [facility];  Service: General;  Laterality: Bilateral;  PEC BLOCK   PORTACATH PLACEMENT Right 01/02/2020   Procedure: INSERTION PORT-A-CATH WITH ULTRASOUND GUIDANCE;  Surgeon: Harriette Bouillon, MD;  Location: [facility];  Service: General;  Laterality: Right;   TONSILLECTOMY     and adenoids removed age 53   VAGINAL HYSTERECTOMY  2002   Patient Active Problem List   Diagnosis Date Noted   Encounter for antineoplastic chemotherapy 02/18/2020   Port-A-Cath in place 02/04/2020   Genetic testing 12/17/2019   Family history of breast cancer    Family history of multiple myeloma    Malignant neoplasm of upper-outer quadrant of right breast in female, estrogen receptor positive ([name]) 11/22/2019   Cyst of left kidney 09/13/2019   Proteinuria 07/18/2019   Acid reflux 07/16/2019   History of COVID-19 07/16/2019   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) 01/03/2019   Poorly controlled type 2 diabetes mellitus ([name]) 01/03/2019   Morbid obesity ([name]) 07/04/2017   Vitamin  D deficiency 09/02/2014   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   Essential hypertension 08/31/2013   Medication management 08/31/2013   Hyperlipidemia associated with type 2 diabetes mellitus ([name])     PCP: Leilani Able, MD   REFERRING PROVIDER: Cassandria Anger, PA-C   REFERRING DIAG: [phone] (ICD-10-CM) - Myalgia, other site   Rationale for Evaluation and Treatment: Rehabilitation  THERAPY DIAG:  Muscle weakness (generalized)  Chronic low back pain with left-sided sciatica, unspecified back pain laterality  ONSET DATE: acute on chronic recent worsening May 2024   SUBJECTIVE:                                                                                                                                                                                            SUBJECTIVE STATEMENT: Patient reports she has been doing ok. No pain currently. She reports noticing some pain occasionally thinking that it might be due to the exercises.   PERTINENT HISTORY:  History of Rt breast cancer Arthritis  Diabetes  CKD PAIN:  Are you having pain? No   PRECAUTIONS: None  WEIGHT BEARING RESTRICTIONS: No  FALLS:  Has patient fallen in last 6 months? No  LIVING ENVIRONMENT: Lives with: lives with their family Lives in: House/apartment Stairs: No Has following equipment at home: Single point cane  OCCUPATION: part time for [employer]   PLOF: Independent  PATIENT GOALS: "learn what I can do to prevent it."    OBJECTIVE:   DIAGNOSTIC FINDINGS:  None on file   PATIENT SURVEYS:  FOTO 54% function to 66% predicted   SCREENING FOR RED FLAGS: Bowel or bladder incontinence: No Cauda equina syndrome: No   COGNITION: Overall cognitive status: Within functional limits for tasks assessed     SENSATION: WFL  MUSCLE LENGTH: Hamstrings: WNL Piriformis: moderate tightness bilaterally   POSTURE: rounded shoulders, forward head, and decreased lumbar lordosis  PALPATION: Tautness and palpable tenderness Lt gluteals, piriformis   LUMBAR ROM:   AROM eval  Flexion 10% limited   Extension 25% limited   Right lateral flexion 25% limited  Left lateral flexion 25% limited   Right rotation WNL  Left rotation WNL   (Blank rows = not tested)  LOWER EXTREMITY ROM:     Active  Right eval Left eval  Hip flexion    Hip extension    Hip abduction    Hip adduction    Hip internal rotation    Hip external rotation    Knee flexion    Knee extension    Ankle dorsiflexion    Ankle plantarflexion    Ankle inversion  Ankle eversion     (Blank rows = not tested)  LOWER EXTREMITY MMT:    MMT Right eval Left eval  Hip flexion 4 4  Hip extension 3+ 3+  Hip abduction  4- 4-  Hip adduction    Hip internal rotation    Hip external rotation    Knee flexion 5 5  Knee extension 5 5  Ankle dorsiflexion 5 5  Ankle plantarflexion 5 5  Ankle inversion 5 5  Ankle eversion 5 5   (Blank rows = not tested)  LUMBAR SPECIAL TESTS:  (-) SLR (+) [place] bilaterally   FUNCTIONAL TESTS:  5 x STS: 20.9 seconds   12/07/22: 5 x STS 17 seconds   GAIT: Distance walked: 10 ft Assistive device utilized: Single point cane Level of assistance: Modified independence Comments: trendelenburg   [facility] Adult PT Treatment:                                                DATE: 12/07/22 Therapeutic Exercise: Figure 4 stretch x 30 sec each  Sciatic nerve glide x 10  Hooklying clamshell 2 x 10 blue band  Supine posterior pelvic tilt multiple reps  Supine march with posterior pelvic tilt 2 x 10  Bridge with posterior pelvic tilt 2 x 10  Sidelying hip abduction 2 x 10    [facility] Adult PT Treatment:                                                DATE: 12/03/22 Therapeutic Exercise: Supine March  10 x 2  Nerve glide Left  x 10 left  Supine clam blue 10 x 2  Side hip abdct x 10 each- difficult on left  Side clam AROM x 10 each Standing hip abdct  x 10 each Standing hip ext x 10 each  Standing march x 10 each  Supine figure 4 push and pull x 1 each  STS x 10 UPDATED HEP     [facility] Adult PT Treatment:                                                DATE: 11/10/22  Therapeutic Exercise: Demonstrated and issued initial HEP.    Therapeutic Activity: Education on assessment findings that will be addressed throughout duration of POC.      PATIENT EDUCATION:  Education details:HEP review Person educated: Patient Education method: Explanation Education comprehension: verbalized understanding  HOME EXERCISE PROGRAM: Access Code: [email or web address] URL: [email or web address] Date: 11/10/2022 Prepared by: Letitia Libra  Exercises - Supine Figure 4 Piriformis Stretch  - 1  x daily - 7 x weekly - 3 sets - 30  hold - Supine Sciatic Nerve Glide  - 1 x daily - 7 x weekly - 1 sets - 10 reps - Hooklying Clamshell with Resistance  - 1 x daily - 7 x weekly - 2 sets - 10 reps - Supine March  - 1 x daily - 7 x weekly - 2 sets - 10 reps  - Standing Hip Abduction with Counter Support  - 1 x daily - 7 x  weekly - 2 sets - 10 reps - Standing Hip Extension with Counter Support  - 1 x daily - 7 x weekly - 2 sets - 10 reps - Supine Bridge  - 1 x daily - 7 x weekly - 2 sets - 10 reps - 5 hold - Sit to stand with control  - 1 x daily - 7 x weekly - 2 sets - 10 reps  ASSESSMENT:  CLINICAL IMPRESSION: Patient arrives without reports of pain. Focused on core stabilization with patient initially having difficulty with posterior pelvic tilts. With heavy cues and continued practice she is able to properly perform. She is challenged with sidelying hip abduction having difficulty maintaining lumbopelvic stability when performing on the LLE. 5 x STS has improved compared to baseline, nearing this short term functional goal. No reports of pain throughout session.   OBJECTIVE IMPAIRMENTS: Abnormal gait, decreased activity tolerance, decreased knowledge of condition, difficulty walking, decreased ROM, decreased strength, increased fascial restrictions, impaired flexibility, improper body mechanics, postural dysfunction, and pain.   ACTIVITY LIMITATIONS: carrying, lifting, bending, sitting, sleeping, transfers, and locomotion level  PARTICIPATION LIMITATIONS: meal prep, cleaning, laundry, driving, shopping, and community activity  PERSONAL FACTORS: Age, Fitness, Profession, Time since onset of injury/illness/exacerbation, and 3+ comorbidities: see PMH above  are also affecting patient's functional outcome.   REHAB POTENTIAL: Good  CLINICAL DECISION MAKING: Stable/uncomplicated  EVALUATION COMPLEXITY: Low   GOALS: Goals reviewed with patient? Yes  SHORT TERM GOALS: Target date:  12/01/2022    Patient will be independent and compliant with initial HEP.   Baseline: see above Goal status: met  2.  Patient will complete 5 x STS in </=15 seconds to improve functional strength.  Baseline: see above  Goal status: progressing    LONG TERM GOALS: Target date: 12/25/22  Patient will score at least 66% on FOTO to signify clinically meaningful improvement in functional abilities.   Baseline: see above Goal status: INITIAL  2.  Patient will be independent with advanced home program to assist in management of her chronic condition.  Baseline: see above Goal status: INITIAL  3.  Patient will demonstrate 4+/5 bilateral hip strength to improve stability about the chain with prolonged walking/standing activity.  Baseline: see above Goal status: INITIAL   PLAN:  PT FREQUENCY: 1x/week  PT DURATION: 6 weeks  PLANNED INTERVENTIONS: Therapeutic exercises, Therapeutic activity, Neuromuscular re-education, Balance training, Gait training, Patient/Family education, Self Care, Joint mobilization, Aquatic Therapy, Dry Needling, Cryotherapy, Moist heat, Manual therapy, and Re-evaluation.  PLAN FOR NEXT SESSION: review and progress HEP prn; hip/core strengthening. Manual to Lt hip.  Letitia Libra, PT, DPT, ATC 12/07/22 1:21 PM

## 2022-12-14 ENCOUNTER — Ambulatory Visit: Payer: Medicare Other

## 2022-12-14 DIAGNOSIS — G8929 Other chronic pain: Secondary | ICD-10-CM

## 2022-12-14 DIAGNOSIS — M6281 Muscle weakness (generalized): Secondary | ICD-10-CM

## 2022-12-14 NOTE — Therapy (Signed)
OUTPATIENT PHYSICAL THERAPY TREATMENT   Patient Name: Tracy Garrett MRN: [id number] DOB:01/15/1956,67 y.o., female Today's Date: 12/14/2022   END OF SESSION:  PT End of Session - 12/14/22 1232     Visit Number 4    Number of Visits 7    Date for PT Re-Evaluation 12/25/22    Authorization Type [employer] MCR    PT Start Time 1232    PT Stop Time 1313    PT Time Calculation (min) 41 min    Activity Tolerance Patient tolerated treatment well    Behavior During Therapy WFL for tasks assessed/performed              Past Medical History:  Diagnosis Date   Allergy    Anemia    Arthritis    Cancer ([name])    right breast cancer   Cataracts, bilateral    Chronic kidney disease    CKD   Family history of breast cancer    Family history of multiple myeloma    Fatty liver disease, nonalcoholic 09/02/2014   [place] appears normal on follow up US 09/2019   GERD (gastroesophageal reflux disease)    History of kidney stones    x2 episodes   Hyperlipidemia    Hypertension    PONV (postoperative nausea and vomiting)    Sleep apnea    test was dx, as mild- no further tx. required   Type II or unspecified type diabetes mellitus without mention of complication, not stated as uncontrolled    Past Surgical History:  Procedure Laterality Date   BREAST SURGERY  1999   Reduction Mammoplasties   CHOLECYSTECTOMY  2003   colonoscopsy  11/2007   neg- polyp removal with both procedures x2   CYSTOSCOPY     surgery to extract stone x 1 with stent   EUS N/A 07/26/2014   Procedure: UPPER ENDOSCOPIC ULTRASOUND (EUS) LINEAR;  Surgeon: Theda Belfast, MD;  Location: [facility];  Service: Endoscopy;  Laterality: N/A;   LAPAROTOMY N/A 10/10/2014   Procedure: 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;  Surgeon: Avel Peace, MD;  Location: [facility];  Service: General;  Laterality: N/A;   LYSIS OF ADHESION  10/10/2014   Procedure: LYSIS OF ADHESION;  Surgeon: Avel Peace, MD;   Location: [facility];  Service: General;;   MASTECTOMY W/ SENTINEL NODE BIOPSY Bilateral 01/02/2020   Procedure: BILATERAL MASTECTOMY WITH RIGHT SENTINEL LYMPH NODE MAPPING;  Surgeon: Harriette Bouillon, MD;  Location: [facility];  Service: General;  Laterality: Bilateral;  PEC BLOCK   PORTACATH PLACEMENT Right 01/02/2020   Procedure: INSERTION PORT-A-CATH WITH ULTRASOUND GUIDANCE;  Surgeon: Harriette Bouillon, MD;  Location: [facility];  Service: General;  Laterality: Right;   TONSILLECTOMY     and adenoids removed age 4   VAGINAL HYSTERECTOMY  2002   Patient Active Problem List   Diagnosis Date Noted   Encounter for antineoplastic chemotherapy 02/18/2020   Port-A-Cath in place 02/04/2020   Genetic testing 12/17/2019   Family history of breast cancer    Family history of multiple myeloma    Malignant neoplasm of upper-outer quadrant of right breast in female, estrogen receptor positive ([name]) 11/22/2019   Cyst of left kidney 09/13/2019   Proteinuria 07/18/2019   Acid reflux 07/16/2019   History of COVID-19 07/16/2019   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) 01/03/2019   Poorly controlled type 2 diabetes mellitus ([name]) 01/03/2019   Morbid obesity ([name]) 07/04/2017   Vitamin D  deficiency 09/02/2014   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   Essential hypertension 08/31/2013   Medication management 08/31/2013   Hyperlipidemia associated with type 2 diabetes mellitus ([name])     PCP: Leilani Able, MD   REFERRING PROVIDER: Cassandria Anger, PA-C   REFERRING DIAG: [phone] (ICD-10-CM) - Myalgia, other site   Rationale for Evaluation and Treatment: Rehabilitation  THERAPY DIAG:  Muscle weakness (generalized)  Chronic low back pain with left-sided sciatica, unspecified back pain laterality  ONSET DATE: acute on chronic recent worsening May 2024   SUBJECTIVE:                                                                                                                                                                                            SUBJECTIVE STATEMENT: "I feel pretty good today. No pain."  PERTINENT HISTORY:  History of Rt breast cancer Arthritis  Diabetes  CKD PAIN:  Are you having pain? No   PRECAUTIONS: None  WEIGHT BEARING RESTRICTIONS: No  FALLS:  Has patient fallen in last 6 months? No  LIVING ENVIRONMENT: Lives with: lives with their family Lives in: House/apartment Stairs: No Has following equipment at home: Single point cane  OCCUPATION: part time for [employer]   PLOF: Independent  PATIENT GOALS: "learn what I can do to prevent it."    OBJECTIVE:   DIAGNOSTIC FINDINGS:  None on file   PATIENT SURVEYS:  FOTO 54% function to 66% predicted   SCREENING FOR RED FLAGS: Bowel or bladder incontinence: No Cauda equina syndrome: No   COGNITION: Overall cognitive status: Within functional limits for tasks assessed     SENSATION: WFL  MUSCLE LENGTH: Hamstrings: WNL Piriformis: moderate tightness bilaterally   POSTURE: rounded shoulders, forward head, and decreased lumbar lordosis  PALPATION: Tautness and palpable tenderness Lt gluteals, piriformis   LUMBAR ROM:   AROM eval  Flexion 10% limited   Extension 25% limited   Right lateral flexion 25% limited  Left lateral flexion 25% limited   Right rotation WNL  Left rotation WNL   (Blank rows = not tested)  LOWER EXTREMITY ROM:     Active  Right eval Left eval  Hip flexion    Hip extension    Hip abduction    Hip adduction    Hip internal rotation    Hip external rotation    Knee flexion    Knee extension    Ankle dorsiflexion    Ankle plantarflexion    Ankle inversion    Ankle eversion     (Blank rows = not tested)  LOWER EXTREMITY MMT:    MMT Right  eval Left eval  Hip flexion 4 4  Hip extension 3+ 3+  Hip abduction 4- 4-  Hip adduction    Hip internal rotation    Hip external rotation    Knee flexion 5 5  Knee extension 5 5  Ankle  dorsiflexion 5 5  Ankle plantarflexion 5 5  Ankle inversion 5 5  Ankle eversion 5 5   (Blank rows = not tested)  LUMBAR SPECIAL TESTS:  (-) SLR (+) [place] bilaterally   FUNCTIONAL TESTS:  5 x STS: 20.9 seconds   12/07/22: 5 x STS 17 seconds   12/14/22: 5 x STS: 12.9 seconds   GAIT: Distance walked: 10 ft Assistive device utilized: Single point cane Level of assistance: Modified independence Comments: trendelenburg  [facility] Adult PT Treatment:                                                DATE: 12/14/22 Therapeutic Exercise: LTR x 1 minute  Supine posterior pelvic tilt 2 x 10  Supine TA march 2 x 10  Sidelying hip abduction 2 x 10  Sit to stand 2 x 10; 5 lbs  Standing resisted hip abduction 2 x 10 red band  Standing resisted hip extension 2 x 10 red band Updated HEP    [facility] Adult PT Treatment:                                                DATE: 12/07/22 Therapeutic Exercise: Figure 4 stretch x 30 sec each  Sciatic nerve glide x 10  Hooklying clamshell 2 x 10 blue band  Supine posterior pelvic tilt multiple reps  Supine march with posterior pelvic tilt 2 x 10  Bridge with posterior pelvic tilt 2 x 10  Sidelying hip abduction 2 x 10    [facility] Adult PT Treatment:                                                DATE: 12/03/22 Therapeutic Exercise: Supine March  10 x 2  Nerve glide Left  x 10 left  Supine clam blue 10 x 2  Side hip abdct x 10 each- difficult on left  Side clam AROM x 10 each Standing hip abdct  x 10 each Standing hip ext x 10 each  Standing march x 10 each  Supine figure 4 push and pull x 1 each  STS x 10 UPDATED HEP     [facility] Adult PT Treatment:                                                DATE: 11/10/22  Therapeutic Exercise: Demonstrated and issued initial HEP.    Therapeutic Activity: Education on assessment findings that will be addressed throughout duration of POC.      PATIENT EDUCATION:  Education details:HEP update Person educated:  Patient Education method: Explanation, demo, cues, handout Education comprehension: verbalized understanding, returned demo, cues   HOME EXERCISE PROGRAM:  Access Code: [email or web address] URL: [email or web address] Date: 11/10/2022 Prepared by: Letitia Libra  Exercises - Supine Figure 4 Piriformis Stretch  - 1 x daily - 7 x weekly - 3 sets - 30  hold - Supine Sciatic Nerve Glide  - 1 x daily - 7 x weekly - 1 sets - 10 reps - Hooklying Clamshell with Resistance  - 1 x daily - 7 x weekly - 2 sets - 10 reps - Supine March  - 1 x daily - 7 x weekly - 2 sets - 10 reps  - Standing Hip Abduction with Counter Support  - 1 x daily - 7 x weekly - 2 sets - 10 reps - Standing Hip Extension with Counter Support  - 1 x daily - 7 x weekly - 2 sets - 10 reps - Supine Bridge  - 1 x daily - 7 x weekly - 2 sets - 10 reps - 5 hold - Sit to stand with control  - 1 x daily - 7 x weekly - 2 sets - 10 reps  ASSESSMENT:  CLINICAL IMPRESSION: Patient arrives without reports of pain. Little carryover of proper posterior pelvic tilts that were performed at last session requiring verbal, tactile, and visual cues initially with ability to correct once cued. Able to progress standing strengthening with patient having difficulty controlling for compensatory lateral trunk lean with hip strengthening. No reports of pain throughout session. Her 5 x STS has improved having met this STG.   OBJECTIVE IMPAIRMENTS: Abnormal gait, decreased activity tolerance, decreased knowledge of condition, difficulty walking, decreased ROM, decreased strength, increased fascial restrictions, impaired flexibility, improper body mechanics, postural dysfunction, and pain.   ACTIVITY LIMITATIONS: carrying, lifting, bending, sitting, sleeping, transfers, and locomotion level  PARTICIPATION LIMITATIONS: meal prep, cleaning, laundry, driving, shopping, and community activity  PERSONAL FACTORS: Age, Fitness, Profession, Time since onset of  injury/illness/exacerbation, and 3+ comorbidities: see PMH above  are also affecting patient's functional outcome.   REHAB POTENTIAL: Good  CLINICAL DECISION MAKING: Stable/uncomplicated  EVALUATION COMPLEXITY: Low   GOALS: Goals reviewed with patient? Yes  SHORT TERM GOALS: Target date: 12/01/2022    Patient will be independent and compliant with initial HEP.   Baseline: see above Goal status: met  2.  Patient will complete 5 x STS in </=15 seconds to improve functional strength.  Baseline: see above  Goal status: met    LONG TERM GOALS: Target date: 12/25/22  Patient will score at least 66% on FOTO to signify clinically meaningful improvement in functional abilities.   Baseline: see above Goal status: INITIAL  2.  Patient will be independent with advanced home program to assist in management of her chronic condition.  Baseline: see above Goal status: INITIAL  3.  Patient will demonstrate 4+/5 bilateral hip strength to improve stability about the chain with prolonged walking/standing activity.  Baseline: see above Goal status: INITIAL   PLAN:  PT FREQUENCY: 1x/week  PT DURATION: 6 weeks  PLANNED INTERVENTIONS: Therapeutic exercises, Therapeutic activity, Neuromuscular re-education, Balance training, Gait training, Patient/Family education, Self Care, Joint mobilization, Aquatic Therapy, Dry Needling, Cryotherapy, Moist heat, Manual therapy, and Re-evaluation.  PLAN FOR NEXT SESSION: review and progress HEP prn; hip/core strengthening. Manual to Lt hip.  Letitia Libra, PT, DPT, ATC 12/14/22 1:13 PM

## 2022-12-21 ENCOUNTER — Ambulatory Visit: Payer: Medicare Other

## 2022-12-21 DIAGNOSIS — G8929 Other chronic pain: Secondary | ICD-10-CM

## 2022-12-21 DIAGNOSIS — M6281 Muscle weakness (generalized): Secondary | ICD-10-CM | POA: Diagnosis not present

## 2022-12-21 NOTE — Therapy (Signed)
OUTPATIENT PHYSICAL THERAPY TREATMENT  PHYSICAL THERAPY DISCHARGE SUMMARY  Visits from Start of Care: 5  Current functional level related to goals / functional outcomes: See goals below   Remaining deficits: Mild hip weakness   Education / Equipment: See education below    Patient agrees to discharge. Patient goals were  mostly met . Patient is being discharged due to being pleased with the current functional level.  Patient Name: Tracy Garrett MRN: [id number] DOB:1955-12-07,67 y.o., female Today's Date: 12/21/2022   END OF SESSION:  PT End of Session - 12/21/22 1231     Visit Number 5    Number of Visits 7    Date for PT Re-Evaluation 12/25/22    Authorization Type [employer] MCR    PT Start Time 1231    PT Stop Time 1311    PT Time Calculation (min) 40 min    Activity Tolerance Patient tolerated treatment well    Behavior During Therapy WFL for tasks assessed/performed              Past Medical History:  Diagnosis Date   Allergy    Anemia    Arthritis    Cancer ([name])    right breast cancer   Cataracts, bilateral    Chronic kidney disease    CKD   Family history of breast cancer    Family history of multiple myeloma    Fatty liver disease, nonalcoholic 09/02/2014   [place] appears normal on follow up US 09/2019   GERD (gastroesophageal reflux disease)    History of kidney stones    x2 episodes   Hyperlipidemia    Hypertension    PONV (postoperative nausea and vomiting)    Sleep apnea    test was dx, as mild- no further tx. required   Type II or unspecified type diabetes mellitus without mention of complication, not stated as uncontrolled    Past Surgical History:  Procedure Laterality Date   BREAST SURGERY  1999   Reduction Mammoplasties   CHOLECYSTECTOMY  2003   colonoscopsy  11/2007   neg- polyp removal with both procedures x2   CYSTOSCOPY     surgery to extract stone x 1 with stent   EUS N/A 07/26/2014   Procedure: UPPER ENDOSCOPIC ULTRASOUND  (EUS) LINEAR;  Surgeon: Theda Belfast, MD;  Location: [facility];  Service: Endoscopy;  Laterality: N/A;   LAPAROTOMY N/A 10/10/2014   Procedure: 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;  Surgeon: Avel Peace, MD;  Location: [facility];  Service: General;  Laterality: N/A;   LYSIS OF ADHESION  10/10/2014   Procedure: LYSIS OF ADHESION;  Surgeon: Avel Peace, MD;  Location: [facility];  Service: General;;   MASTECTOMY W/ SENTINEL NODE BIOPSY Bilateral 01/02/2020   Procedure: BILATERAL MASTECTOMY WITH RIGHT SENTINEL LYMPH NODE MAPPING;  Surgeon: Harriette Bouillon, MD;  Location: [facility];  Service: General;  Laterality: Bilateral;  PEC BLOCK   PORTACATH PLACEMENT Right 01/02/2020   Procedure: INSERTION PORT-A-CATH WITH ULTRASOUND GUIDANCE;  Surgeon: Harriette Bouillon, MD;  Location: [facility];  Service: General;  Laterality: Right;   TONSILLECTOMY     and adenoids removed age 48   VAGINAL HYSTERECTOMY  2002   Patient Active Problem List   Diagnosis Date Noted   Encounter for antineoplastic chemotherapy 02/18/2020   Port-A-Cath in place 02/04/2020   Genetic testing 12/17/2019   Family history of breast cancer    Family history of multiple myeloma    Malignant neoplasm of upper-outer  quadrant of right breast in female, estrogen receptor positive ([name]) 11/22/2019   Cyst of left kidney 09/13/2019   Proteinuria 07/18/2019   Acid reflux 07/16/2019   History of COVID-19 07/16/2019   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) 01/03/2019   Poorly controlled type 2 diabetes mellitus ([name]) 01/03/2019   Morbid obesity ([name]) 07/04/2017   Vitamin D deficiency 09/02/2014   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   Essential hypertension 08/31/2013   Medication management 08/31/2013   Hyperlipidemia associated with type 2 diabetes mellitus ([name])     PCP: Leilani Able, MD   REFERRING PROVIDER: Cassandria Anger, PA-C   REFERRING DIAG: [id number] (ICD-10-CM) - Myalgia, other  site   Rationale for Evaluation and Treatment: Rehabilitation  THERAPY DIAG:  Muscle weakness (generalized)  Chronic low back pain with left-sided sciatica, unspecified back pain laterality  ONSET DATE: acute on chronic recent worsening May 2024   SUBJECTIVE:                                                                                                                                                                                           SUBJECTIVE STATEMENT: "I have been feeling pretty good. No pain right now." She reports occasional pain along the Lt lateral hip when laying down, but it is not as often or intense.   PERTINENT HISTORY:  History of Rt breast cancer Arthritis  Diabetes  CKD PAIN:  Are you having pain? No   PRECAUTIONS: None  WEIGHT BEARING RESTRICTIONS: No  FALLS:  Has patient fallen in last 6 months? No  LIVING ENVIRONMENT: Lives with: lives with their family Lives in: House/apartment Stairs: No Has following equipment at home: Single point cane  OCCUPATION: part time for [employer]   PLOF: Independent  PATIENT GOALS: "learn what I can do to prevent it."    OBJECTIVE:   DIAGNOSTIC FINDINGS:  None on file   PATIENT SURVEYS:  FOTO 54% function to 66% predicted  12/21/22: 73% function   SCREENING FOR RED FLAGS: Bowel or bladder incontinence: No Cauda equina syndrome: No   COGNITION: Overall cognitive status: Within functional limits for tasks assessed     SENSATION: WFL  MUSCLE LENGTH: Hamstrings: WNL Piriformis: moderate tightness bilaterally   POSTURE: rounded shoulders, forward head, and decreased lumbar lordosis  PALPATION: [place] and palpable tenderness Lt gluteals, piriformis   LUMBAR ROM:   AROM eval 12/21/22  Flexion 10% limited  WNL  Extension 25% limited  WNL  Right lateral flexion 25% limited WNL  Left lateral flexion 25% limited  WNL  Right rotation WNL WNL  Left rotation  WNL WNL   (Blank rows = not  tested)  LOWER EXTREMITY ROM:     Active  Right eval Left eval  Hip flexion    Hip extension    Hip abduction    Hip adduction    Hip internal rotation    Hip external rotation    Knee flexion    Knee extension    Ankle dorsiflexion    Ankle plantarflexion    Ankle inversion    Ankle eversion     (Blank rows = not tested)  LOWER EXTREMITY MMT:    MMT Right eval Left eval 12/21/22  Hip flexion 4 4 5  bilateral  Hip extension 3+ 3+ 4 bilateral   Hip abduction 4- 4- Lt: 4; Rt: 4+  Hip adduction     Hip internal rotation     Hip external rotation     Knee flexion 5 5   Knee extension 5 5   Ankle dorsiflexion 5 5   Ankle plantarflexion 5 5   Ankle inversion 5 5   Ankle eversion 5 5    (Blank rows = not tested)  LUMBAR SPECIAL TESTS:  (-) SLR (+) [place] bilaterally   FUNCTIONAL TESTS:  5 x STS: 20.9 seconds   12/07/22: 5 x STS 17 seconds   12/14/22: 5 x STS: 12.9 seconds   GAIT: Distance walked: 10 ft Assistive device utilized: Single point cane Level of assistance: Modified independence Comments: trendelenburg  [facility] Adult PT Treatment:                                                DATE: 12/21/22 Therapeutic Exercise: Performed advanced HEP discussing frequency, sets, reps, and ways to progress independently.  Exercises - Supine Figure 4 Piriformis Stretch  - 1 x daily - 7 x weekly - 3 sets - 30  hold - Supine Sciatic Nerve Glide  - 1 x daily - 7 x weekly - 1 sets - 10 reps - Supine Bridge  - 1 x daily - 7 x weekly - 2 sets - 10 reps - 5 hold - Sit to stand with control  - 1 x daily - 7 x weekly - 2 sets - 10 reps - Supine Posterior Pelvic Tilt  - 1 x daily - 7 x weekly - 2 sets - 10 reps - Supine March with Posterior Pelvic Tilt  - 1 x daily - 7 x weekly - 2 sets - 10 reps - Sidelying Hip Abduction  - 1 x daily - 7 x weekly - 2 sets - 10 reps - Standing Hip Abduction with Resistance at Ankles and Counter Support  - 1 x daily - 7 x weekly - 2 sets - 10 reps -  Standing Hip Extension with Resistance at Ankles and Counter Support  - 1 x daily - 7 x weekly - 2 sets - 10 reps  Therapeutic Activity: Re-assessment to determine overall progress, educating patient on progress towards goals.     [facility] Adult PT Treatment:                                                DATE: 12/14/22 Therapeutic Exercise: LTR x 1 minute  Supine posterior pelvic tilt 2  x 10  Supine TA march 2 x 10  Sidelying hip abduction 2 x 10  Sit to stand 2 x 10; 5 lbs  Standing resisted hip abduction 2 x 10 red band  Standing resisted hip extension 2 x 10 red band Updated HEP    [facility] Adult PT Treatment:                                                DATE: 12/07/22 Therapeutic Exercise: Figure 4 stretch x 30 sec each  Sciatic nerve glide x 10  Hooklying clamshell 2 x 10 blue band  Supine posterior pelvic tilt multiple reps  Supine march with posterior pelvic tilt 2 x 10  Bridge with posterior pelvic tilt 2 x 10  Sidelying hip abduction 2 x 10   PATIENT EDUCATION:  Education details:see treatment; d/c education  Person educated: Patient Education method: Programmer, multimedia, demo, cues, handout Education comprehension: verbalized understanding, returned demo  HOME EXERCISE PROGRAM: Access Code: [email or web address] URL: [email or web address] Date: 11/10/2022 Prepared by: Letitia Libra  Exercises - Supine Figure 4 Piriformis Stretch  - 1 x daily - 7 x weekly - 3 sets - 30  hold - Supine Sciatic Nerve Glide  - 1 x daily - 7 x weekly - 1 sets - 10 reps - Hooklying Clamshell with Resistance  - 1 x daily - 7 x weekly - 2 sets - 10 reps - Supine March  - 1 x daily - 7 x weekly - 2 sets - 10 reps  - Standing Hip Abduction with Counter Support  - 1 x daily - 7 x weekly - 2 sets - 10 reps - Standing Hip Extension with Counter Support  - 1 x daily - 7 x weekly - 2 sets - 10 reps - Supine Bridge  - 1 x daily - 7 x weekly - 2 sets - 10 reps - 5 hold - Sit to stand with control  - 1 x  daily - 7 x weekly - 2 sets - 10 reps  ASSESSMENT:  CLINICAL IMPRESSION: Tracy Garrett has progressed well since the start of care for her low back/LLE pain. She reports an overall improvement in frequency, intensity, and duration of her pain noting that she still occasionally experiences Lt lateral hip pain with sidelying. She demonstrates full and pain free trunk AROM as well as improvements in core and hip strength. She has met the majority of established functional goals with exception of hip strength goal. She is independent with advanced home program to further progress her strengthening. She is appropriate for discharge at this time with patient in agreement with this plan.   OBJECTIVE IMPAIRMENTS: Abnormal gait, decreased activity tolerance, decreased knowledge of condition, difficulty walking, decreased ROM, decreased strength, increased fascial restrictions, impaired flexibility, improper body mechanics, postural dysfunction, and pain.   ACTIVITY LIMITATIONS: carrying, lifting, bending, sitting, sleeping, transfers, and locomotion level  PARTICIPATION LIMITATIONS: meal prep, cleaning, laundry, driving, shopping, and community activity  PERSONAL FACTORS: Age, Fitness, Profession, Time since onset of injury/illness/exacerbation, and 3+ comorbidities: see PMH above  are also affecting patient's functional outcome.   REHAB POTENTIAL: Good  CLINICAL DECISION MAKING: Stable/uncomplicated  EVALUATION COMPLEXITY: Low   GOALS: Goals reviewed with patient? Yes  SHORT TERM GOALS: Target date: 12/01/2022    Patient will be independent and compliant with initial HEP.   Baseline: see above Goal  status: met  2.  Patient will complete 5 x STS in </=15 seconds to improve functional strength.  Baseline: see above  Goal status: met    LONG TERM GOALS: Target date: 12/25/22  Patient will score at least 66% on FOTO to signify clinically meaningful improvement in functional abilities.    Baseline: see above Goal status: met  2.  Patient will be independent with advanced home program to assist in management of her chronic condition.  Baseline: see above Goal status: met  3.  Patient will demonstrate 4+/5 bilateral hip strength to improve stability about the chain with prolonged walking/standing activity.  Baseline: see above Goal status: partially met    PLAN:  PT FREQUENCY: n/a  PT DURATION:n/a  PLANNED INTERVENTIONS: Therapeutic exercises, Therapeutic activity, Neuromuscular re-education, Balance training, Gait training, Patient/Family education, Self Care, Joint mobilization, Aquatic Therapy, Dry Needling, Cryotherapy, Moist heat, Manual therapy, and Re-evaluation.  PLAN FOR NEXT SESSION: n/a   Letitia Libra, PT, DPT, ATC 12/21/22 1:12 PM

## 2023-01-13 ENCOUNTER — Other Ambulatory Visit: Payer: Self-pay | Admitting: Nurse Practitioner

## 2023-01-13 DIAGNOSIS — K219 Gastro-esophageal reflux disease without esophagitis: Secondary | ICD-10-CM

## 2023-01-21 ENCOUNTER — Ambulatory Visit: Payer: Medicare Other | Admitting: Dietician

## 2023-03-21 ENCOUNTER — Encounter: Payer: Medicare Other | Attending: Nephrology | Admitting: Dietician

## 2023-03-21 ENCOUNTER — Encounter: Payer: Self-pay | Admitting: Dietician

## 2023-03-21 DIAGNOSIS — E118 Type 2 diabetes mellitus with unspecified complications: Secondary | ICD-10-CM | POA: Insufficient documentation

## 2023-03-21 NOTE — Progress Notes (Signed)
Diabetes Self-Management Education  Visit Type: Follow-up  Appt. Start Time: 1315 Appt. End Time: 1345  03/21/2023  [facility], identified by name and date of birth, is a 67 y.o. female with a diagnosis of Diabetes:  .   ASSESSMENT Patient is here today alone.  This is the second of 3 employee visits.  She was last seen on 11/02/2022.  She states that she got a cortisone shot in her knee last week and states that she feels better.  Her blood glucose has increased due to this.  Fasting glucose this am is 160. Patient would like to learn about healthy eating for her diabetes and kidneys.  She states that she has had diabetes for a long time but initially was in denial. Fasting blood glucose this am 212. She has had new labs but unable to see in [facility] or care everywhere.  States that she has struggled with her food intake and had gained weight since last visit. She went to a Public relations account executive for breast cancer, saw her picture, and decided to make changes.  She is now losing.  She has asked her husband to keep his snacks in the pantry.  Now using her Weight Watcher tracker. She bought a Quarry manager and uses this most every day.     Referral:  Type 2 Diabetes with CKD History includes:  Type 2 Diabetes, HTN, HLD, CKD stage 2 with proteinuria, GERD, OSA, history of breast cancer, anemia Medication includes:  Ozempic (1 mg), Marcelline Deist (stopped due to frequent UTI's), vitmain D3, vitamin B-12 (sublingual) Labs include:  A1C 8.7% 08/07/2020 (none current and does not know current), BUN 15, Creatine 0.96, Potassium 4.1, eGFR 65, Phosphorous 4.3, magnesium 1.8, negative protein in urine on 08/25/22 Sleep:  poor (2-5 hours per night)   62.75" 214 lbs 03/21/2023 214 lbs 11/02/2022 219 lbs 08/25/2022 Initial goal is 199 lbs bu the first of the year.     Patient lives with his husband and son.  She does the cooking and they don't always want what she cooks. 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 Allergy:  walnuts, pecans, shellfish.  Milk causes post nasal drip to feel worse. Walks with a cane and has knee problems.  She has a stationary bike but has not used this recently and is not sure if she should.    Diabetes Self-Management Education - 03/21/23 1602       Visit Information   Visit Type Follow-up      Psychosocial Assessment   What is the hardest part about your diabetes right now, causing you the most concern, or is the most worrisome to you about your diabetes?   Making healty food and beverage choices    Self-management support Doctor's office;CDE visits    Other persons present Patient    Patient Concerns Nutrition/Meal planning;Weight Control;Glycemic Control    Special Needs None    Preferred Learning Style No preference indicated    Learning Readiness Ready      Pre-Education Assessment   Patient understands the diabetes disease and treatment process. Needs Review    Patient understands incorporating nutritional management into lifestyle. Needs Review    Patient undertands incorporating physical activity into lifestyle. Needs Review    Patient understands using medications safely. Needs Review    Patient understands monitoring blood glucose, interpreting and using results Needs Review    Patient understands prevention, detection, and treatment of acute complications. Needs Review  Patient understands prevention, detection, and treatment of chronic complications. Needs Review    Patient understands how to develop strategies to address psychosocial issues. Needs Review    Patient understands how to develop strategies to promote health/change behavior. Needs Review      Complications   How often do you check your blood sugar? 1-2 times/day    Fasting Blood glucose range (mg/dL) 161-096      Dietary Intake   Breakfast Organic fruit/vegetable pouch    Snack (morning) regular Wurther's candy    Lunch 1/2 cup casserole  (hamburger, cr. Mushroom soup, white potato, cheese)    Snack (afternoon) regular Wurther's candy    Dinner taste of casserole, kale, lime beans, corn, chicken and dumplings    Snack (evening) regular Wurther's candy    Beverage(s) water, unsweetened herbal tea, occasional sweet tea      Activity / Exercise   Activity / Exercise Type Light (walking / raking leaves)    How many days per week do you exercise? 5    How many minutes per day do you exercise? 15    Total minutes per week of exercise 75      Patient Education   Previous Diabetes Education Yes (please comment)   11/02/2022   Healthy Eating Meal options for control of blood glucose level and chronic complications.;Information on hints to eating out and maintain blood glucose control.;Plate Method;Role of diet in the treatment of diabetes and the relationship between the three main macronutrients and blood glucose level    Being Active Role of exercise on diabetes management, blood pressure control and cardiac health.;Helped patient identify appropriate exercises in relation to his/her diabetes, diabetes complications and other health issue.    Medications Reviewed patients medication for diabetes, action, purpose, timing of dose and side effects.    Monitoring Taught/evaluated SMBG meter.    Diabetes Stress and Support Identified and addressed patients feelings and concerns about diabetes;Worked with patient to identify barriers to care and solutions      Individualized Goals (developed by patient)   Nutrition General guidelines for healthy choices and portions discussed    Physical Activity Exercise 5-7 days per week;15 minutes per day    Medications take my medication as prescribed    Monitoring  Test my blood glucose as discussed    Problem Solving Eating Pattern    Reducing Risk examine blood glucose patterns      Patient Self-Evaluation of Goals - Patient rates self as meeting previously set goals (% of time)   Nutrition >75%  (most of the time)    Physical Activity >75% (most of the time)    Medications >75% (most of the time)    Monitoring >75% (most of the time)    Problem Solving and behavior change strategies  >75% (most of the time)    Reducing Risk (treating acute and chronic complications) >75% (most of the time)    Health Coping 50 - 75 % (half of the time)      Post-Education Assessment   Patient understands the diabetes disease and treatment process. Demonstrates understanding / competency    Patient understands incorporating nutritional management into lifestyle. Comprehends key points    Patient undertands incorporating physical activity into lifestyle. Demonstrates understanding / competency    Patient understands using medications safely. Demonstrates understanding / competency    Patient understands monitoring blood glucose, interpreting and using results Demonstrates understanding / competency    Patient understands prevention, detection, and treatment of acute  complications. Demonstrates understanding / competency    Patient understands prevention, detection, and treatment of chronic complications. Demonstrates understanding / competency    Patient understands how to develop strategies to address psychosocial issues. Demonstrates understanding / competency    Patient understands how to develop strategies to promote health/change behavior. Comprehends key points      Outcomes   Future DMSE 3-4 months    Program Status Not Completed      Subsequent Visit   Since your last visit have you experienced any weight changes? No change             Individualized Plan for Diabetes Self-Management Training:   Learning Objective:  Patient will have a greater understanding of diabetes self-management. Patient education plan is to attend individual and/or group sessions per assessed needs and concerns.   Plan:   Patient Instructions  Resources: (recipes) Cooking for [employer] blog Plant  [employer] Watcher's   Continue to use the peddler. Continue mindful eating. Increase your vegetables to 1/2 the plate   Expected Outcomes:     Education material provided: Diabetes Resources  If problems or questions, patient to contact team via:  Phone  Future DSME appointment: 3-4 months

## 2023-03-21 NOTE — Patient Instructions (Addendum)
Resources: (recipes) Cooking for [employer] blog [employer] Watcher's   Continue to use the peddler. Continue mindful eating. Increase your vegetables to 1/2 the plate

## 2023-03-29 ENCOUNTER — Other Ambulatory Visit (HOSPITAL_COMMUNITY): Payer: Self-pay

## 2023-03-29 ENCOUNTER — Encounter: Payer: Self-pay | Admitting: Oncology

## 2023-03-29 MED ORDER — INFLUENZA VAC A&B SURF ANT ADJ 0.5 ML IM SUSY
0.5000 mL | PREFILLED_SYRINGE | Freq: Once | INTRAMUSCULAR | 0 refills | Status: AC
  Filled 2023-03-29: qty 0.5, 1d supply, fill #0

## 2023-03-29 MED ORDER — PNEUMOCOCCAL 20-VAL CONJ VACC 0.5 ML IM SUSY
0.5000 mL | PREFILLED_SYRINGE | Freq: Once | INTRAMUSCULAR | 0 refills | Status: AC
  Filled 2023-03-29: qty 0.5, 1d supply, fill #0

## 2023-04-29 ENCOUNTER — Other Ambulatory Visit: Payer: Self-pay | Admitting: *Deleted

## 2023-04-29 DIAGNOSIS — Z170 Estrogen receptor positive status [ER+]: Secondary | ICD-10-CM

## 2023-05-02 ENCOUNTER — Inpatient Hospital Stay (HOSPITAL_BASED_OUTPATIENT_CLINIC_OR_DEPARTMENT_OTHER): Payer: Medicare Other | Admitting: Hematology and Oncology

## 2023-05-02 ENCOUNTER — Inpatient Hospital Stay: Payer: Medicare Other | Attending: Hematology and Oncology

## 2023-05-02 VITALS — BP 146/78 | HR 87 | Temp 98.0°F | Resp 16 | Wt 214.8 lb

## 2023-05-02 DIAGNOSIS — C50411 Malignant neoplasm of upper-outer quadrant of right female breast: Secondary | ICD-10-CM

## 2023-05-02 DIAGNOSIS — Z79811 Long term (current) use of aromatase inhibitors: Secondary | ICD-10-CM | POA: Insufficient documentation

## 2023-05-02 DIAGNOSIS — C50811 Malignant neoplasm of overlapping sites of right female breast: Secondary | ICD-10-CM | POA: Diagnosis present

## 2023-05-02 DIAGNOSIS — Z170 Estrogen receptor positive status [ER+]: Secondary | ICD-10-CM | POA: Diagnosis not present

## 2023-05-02 DIAGNOSIS — Z9013 Acquired absence of bilateral breasts and nipples: Secondary | ICD-10-CM | POA: Insufficient documentation

## 2023-05-02 LAB — CBC WITH DIFFERENTIAL (CANCER CENTER ONLY)
Abs Immature Granulocytes: 0.02 10*3/uL (ref 0.00–0.07)
Basophils Absolute: 0 10*3/uL (ref 0.0–0.1)
Basophils Relative: 0 %
Eosinophils Absolute: 0.2 10*3/uL (ref 0.0–0.5)
Eosinophils Relative: 2 %
HCT: 40 % (ref 36.0–46.0)
Hemoglobin: 13.3 g/dL (ref 12.0–15.0)
Immature Granulocytes: 0 %
Lymphocytes Relative: 23 %
Lymphs Abs: 2.2 10*3/uL (ref 0.7–4.0)
MCH: 30.7 pg (ref 26.0–34.0)
MCHC: 33.3 g/dL (ref 30.0–36.0)
MCV: 92.4 fL (ref 80.0–100.0)
Monocytes Absolute: 0.9 10*3/uL (ref 0.1–1.0)
Monocytes Relative: 9 %
Neutro Abs: 6.1 10*3/uL (ref 1.7–7.7)
Neutrophils Relative %: 66 %
Platelet Count: 273 10*3/uL (ref 150–400)
RBC: 4.33 MIL/uL (ref 3.87–5.11)
RDW: 14 % (ref 11.5–15.5)
WBC Count: 9.5 10*3/uL (ref 4.0–10.5)
nRBC: 0 % (ref 0.0–0.2)

## 2023-05-02 LAB — CMP (CANCER CENTER ONLY)
ALT: 11 U/L (ref 0–44)
AST: 10 U/L — ABNORMAL LOW (ref 15–41)
Albumin: 4.2 g/dL (ref 3.5–5.0)
Alkaline Phosphatase: 98 U/L (ref 38–126)
Anion gap: 8 (ref 5–15)
BUN: 16 mg/dL (ref 8–23)
CO2: 30 mmol/L (ref 22–32)
Calcium: 9.6 mg/dL (ref 8.9–10.3)
Chloride: 102 mmol/L (ref 98–111)
Creatinine: 1.06 mg/dL — ABNORMAL HIGH (ref 0.44–1.00)
GFR, Estimated: 58 mL/min — ABNORMAL LOW (ref >60)
Glucose, Bld: 134 mg/dL — ABNORMAL HIGH (ref 70–99)
Potassium: 4 mmol/L (ref 3.5–5.1)
Sodium: 140 mmol/L (ref 135–145)
Total Bilirubin: 0.6 mg/dL (ref <1.2)
Total Protein: 7.6 g/dL (ref 6.5–8.1)

## 2023-05-02 NOTE — Progress Notes (Signed)
[facility]  [phone] [phone]     ID: MINDAY GUINAN DOB: [birth year]  MR#: [id number]  JYN#:[id number]  Patient Care Team: Leilani Able, MD as PCP - General (Family Medicine) Jodelle Red, MD as PCP - Cardiology (Cardiology) Pershing Proud, RN as Oncology Nurse Navigator Donnelly Angelica, RN as Oncology Nurse Navigator Harriette Bouillon, MD as Consulting Physician (General Surgery) Lonie Peak, MD as Attending Physician (Radiation Oncology) Kathryne Hitch, MD as Consulting Physician (Orthopedic Surgery) Regal, Kirstie Peri, DPM as Consulting Physician (Podiatry) Laurey Morale, MD as Consulting Physician (Cardiology) Rachel Moulds, MD as Consulting Physician (Hematology and Oncology) Rachel Moulds, MD OTHER MD:  CHIEF COMPLAINT: triple positive breast cancer (s/p bilateral mastectomies)  CURRENT TREATMENT: anastrozole  INTERVAL HISTORY: Discussed the use of AI scribe software for clinical note transcription with the patient, who gave verbal consent to proceed.  History of Present Illness          Tracy returns today for follow up of her triple positive breast cancer.  The patient, with a history of breast cancer and fatty liver, presents with a small white area under her right armpit. She has noticed this change for a while, but it does not cause any discomfort or pain. She also mentions a cyst that was recently removed from the groin area. The patient is currently on anastrozole and Ozempic. The patient has not noticed any significant weight loss since starting Ozempic, a medication prescribed for diabetes and weight loss. She also reports occasional swelling in her left ankle, had a stress fracture in that leg many yrs ago and since then it has stayed that way.  REVIEW OF SYSTEMS:  COVID 19 VACCINATION STATUS: fully vaccinated [employer]), with booster 05/2020; also had COVID in 2020   HISTORY OF CURRENT  ILLNESS: From the original intake note:  Tracy Garrett had routine screening mammography on 05/01/2020 showing a possible abnormality in the right breast. She underwent right diagnostic mammography with tomography at [facility] on 05/09/2019 showing: breast density category A; probably-benign regional calcifications in the right breast at 11 o'clock. Short term follow up was recommended.   She returned for follow up at [facility] on 11/13/2019. She underwent right diagnostic mammography and right breast ultrasonography showing: breast composition A; indeterminate calcifications in upper-outer right breast spanning 5 cm; 0.8 cm irregular mass in right breast at 9 o'clock.  Accordingly on 11/20/2019 she proceeded to biopsy of the right breast areas in question. The pathology from this procedure ([id number]) showed:  1. Right breast, UOQ  - foci of invasive and in situ mammary carcinoma, grade 2-3, e-cadherin positive.  2. Right breast, 9 o'clock  - invasive mammary carcinoma, grade 2-3, e-cadherin positive  - Prognostic indicators significant for: estrogen receptor, 95% positive and progesterone receptor, 25% positive, both with strong staining intensity. Proliferation marker Ki67 at 20%. HER2 equivocal by immunohistochemistry (2+), but positive by fluorescent in situ hybridization with a signals ratio 3.30 and number per cell 7.25.  The patient's subsequent history is as detailed below.   PAST MEDICAL HISTORY: Past Medical History:  Diagnosis Date   Allergy    Anemia    Arthritis    Cancer ([name])    right breast cancer   Cataracts, bilateral    Chronic kidney disease    CKD   Family history of breast cancer    Family history of multiple myeloma    Fatty liver disease, nonalcoholic 09/02/2014   [place] appears normal on follow  up [place] 09/2019   GERD (gastroesophageal reflux disease)    History of kidney stones    x2 episodes   Hyperlipidemia    Hypertension    PONV (postoperative nausea and  vomiting)    Sleep apnea    test was dx, as mild- no further tx. required   Type II or unspecified type diabetes mellitus without mention of complication, not stated as uncontrolled     PAST SURGICAL HISTORY: Past Surgical History:  Procedure Laterality Date   BREAST SURGERY  1999   Reduction Mammoplasties   CHOLECYSTECTOMY  2003   colonoscopsy  11/2007   neg- polyp removal with both procedures x2   CYSTOSCOPY     surgery to extract stone x 1 with stent   EUS N/A 07/26/2014   Procedure: UPPER ENDOSCOPIC ULTRASOUND (EUS) LINEAR;  Surgeon: Theda Belfast, MD;  Location: [facility] ENDOSCOPY;  Service: Endoscopy;  Laterality: N/A;   LAPAROTOMY N/A 10/10/2014   Procedure: 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;  Surgeon: Avel Peace, MD;  Location: [facility] ORS;  Service: General;  Laterality: N/A;   LYSIS OF ADHESION  10/10/2014   Procedure: LYSIS OF ADHESION;  Surgeon: Avel Peace, MD;  Location: [facility] ORS;  Service: General;;   MASTECTOMY W/ SENTINEL NODE BIOPSY Bilateral 01/02/2020   Procedure: BILATERAL MASTECTOMY WITH RIGHT SENTINEL LYMPH NODE MAPPING;  Surgeon: Harriette Bouillon, MD;  Location: [facility] OR;  Service: General;  Laterality: Bilateral;  PEC BLOCK   PORTACATH PLACEMENT Right 01/02/2020   Procedure: INSERTION PORT-A-CATH WITH ULTRASOUND GUIDANCE;  Surgeon: Harriette Bouillon, MD;  Location: [facility] OR;  Service: General;  Laterality: Right;   TONSILLECTOMY     and adenoids removed age 52   VAGINAL HYSTERECTOMY  2002    FAMILY HISTORY: Family History  Problem Relation Age of Onset   Breast cancer Mother 74   Heart disease Father        smoker   Diabetes type I Half-Sister    Alzheimer's disease Maternal Grandmother    Emphysema Paternal Grandfather        smoker   Heart attack Paternal Grandfather    Lung cancer Paternal Grandfather    CAD Brother 18       stents   Breast cancer Maternal Aunt        dx. in her late 64s   Breast cancer Cousin 11   Cancer Maternal  Uncle        dx. in his 60s; tumor on the back of neck   Breast cancer Paternal Aunt        dx. >50   Multiple myeloma Maternal Aunt 18   Multiple myeloma Paternal Aunt        dx. >50   Breast cancer Cousin 80   Her father died at age 45. Her mother died at age 53. She has 2 brothers and had 1 sister who is deceased. She reports breast cancer on her maternal side-- her mother at age 46, an aunt, and a cousin.   GYNECOLOGIC HISTORY:  No LMP recorded. Patient has had a hysterectomy. Menarche: 67 years old Age at first live birth: 67 years old GX P 2 LMP unsure Contraceptive: never used HRT never used  Hysterectomy? Yes, 2002 BSO? no  SOCIAL HISTORY: (updated 11/2019)  Izzibella is currently working for [employer]. Husband Verdon Cummins works for [employer], a Clinical biochemist. Son Asencion Islam, age 4, is self-employed in [place], [place]. Son Stanton, age 41, lives  here in [place].     ADVANCED DIRECTIVES: In the absence of any documentation to the contrary, the patient's spouse is their HCPOA.    HEALTH MAINTENANCE: Social History   Tobacco Use   Smoking status: Never   Smokeless tobacco: Never  Vaping Use   Vaping status: Never Used  Substance Use Topics   Alcohol use: No   Drug use: No     Colonoscopy: 02/2019 (Dr. Loreta Ave), repeat due 2025  PAP: none on file (s/p hysterectomy)  Bone density: never done   Allergies  Allergen Reactions   Pecan Nut (Diagnostic)     Other reaction(s): Unknown   Hydromorphone Rash    Arm reddened at IV med site   Other Itching and Rash    NUTS CAUSE MOUTH TO [facility]   Shellfish Allergy Other (See Comments) and Rash    Current Outpatient Medications  Medication Sig Dispense Refill   anastrozole (ARIMIDEX) 1 MG tablet Take 1 tablet (1 mg total) by mouth daily. 30 tablet 40   aspirin EC 81 MG tablet Take 81 mg by mouth daily. Swallow whole.     cetirizine (ZYRTEC) 10 MG tablet Take 10 mg by mouth daily. (Patient not taking:  Reported on 11/02/2022)     Cholecalciferol (VITAMIN D3) 5000 UNITS TABS Take 5,000 Units by mouth daily.      Cranberry 1000 MG CAPS cranberry     cyanocobalamin (VITAMIN B12) 1000 MCG tablet Take 1,000 mcg by mouth daily.     doxycycline (VIBRAMYCIN) 100 MG capsule Take 1 capsule (100 mg total) by mouth 2 (two) times daily. 14 capsule 0   FARXIGA 10 MG TABS tablet Take 10 mg by mouth daily. (Patient not taking: Reported on 11/02/2022)     fexofenadine (ALLEGRA) 60 MG tablet Take 60 mg by mouth 2 (two) times daily.     glucose blood (CONTOUR NEXT TEST) test strip Check blood sugar 1 time daily-DX-E11.9 100 each 12   influenza vac split quadrivalent PF (FLUARIX) 0.5 ML injection Inject 0.5 mLs into the muscle. 0.5 mL 0   influenza vaccine adjuvanted (FLUAD QUADRIVALENT) 0.5 ML injection Inject 0.5 mLs into the muscle. 0.5 mL 0   ketoconazole (NIZORAL) 2 % cream Apply 1 Application topically daily. 30 g 0   losartan (COZAAR) 50 MG tablet TAKE 1/2 TO 1 TABLET BY MOUTH  DAILY FOR BLOOD PRESSURE 90 tablet 3   Omega-3 Fatty Acids (OMEGA 3 PO) Take 1 capsule by mouth 2 (two) times daily. (Patient not taking: Reported on 11/02/2022)     OZEMPIC, 0.25 OR 0.5 MG/DOSE, 2 MG/3ML SOPN SMARTSIG:0.5 Milligram(s) Topical Once a Week     pantoprazole (PROTONIX) 40 MG tablet TAKE 1 TABLET DAILY TO PREVENT HEARTBURN & INDIGESTION. 90 tablet 3   rosuvastatin (CRESTOR) 5 MG tablet TAKE 1 TABLET BY MOUTH EVERY DAY 90 tablet 1   No current facility-administered medications for this visit.    OBJECTIVE: African-American woman using a cane  There were no vitals filed for this visit.     There is no height or weight on file to calculate BMI.   Wt Readings from Last 3 Encounters:  11/12/22 211 lb (95.7 kg)  11/02/22 214 lb (97.1 kg)  05/03/22 212 lb 4.8 oz (96.3 kg)     ECOG FS:1 - Symptomatic but completely ambulatory  Physical Exam Constitutional:      Appearance: Normal appearance.  Cardiovascular:      Rate and Rhythm: Normal rate and regular rhythm.  Pulses: Normal pulses.     Heart sounds: Normal heart sounds.  Pulmonary:     Effort: Pulmonary effort is normal.     Breath sounds: Normal breath sounds.  Chest:     Comments: Bilateral mastectomy.  No concern for recurrence.  No palpable regional adenopathy Musculoskeletal:        General: No swelling or tenderness.     Cervical back: Normal range of motion and neck supple. No rigidity.  Lymphadenopathy:     Cervical: No cervical adenopathy.  Skin:    General: Skin is warm and dry.  Neurological:     General: No focal deficit present.     Mental Status: She is alert.    LAB RESULTS:  CMP     Component Value Date/Time   NA 140 05/03/2022 1302   K 3.8 05/03/2022 1302   CL 104 05/03/2022 1302   CO2 30 05/03/2022 1302   GLUCOSE 125 (H) 05/03/2022 1302   BUN 19 05/03/2022 1302   CREATININE 1.06 (H) 05/03/2022 1302   CREATININE 0.95 09/03/2020 1558   CALCIUM 9.8 05/03/2022 1302   PROT 7.5 05/03/2022 1302   ALBUMIN 4.4 05/03/2022 1302   AST 10 (L) 05/03/2022 1302   ALT 12 05/03/2022 1302   ALKPHOS 97 05/03/2022 1302   BILITOT 0.4 05/03/2022 1302   GFRNONAA 58 (L) 05/03/2022 1302   GFRNONAA 63 09/03/2020 1558   GFRAA 73 09/03/2020 1558    Lab Results  Component Value Date   ALBUMINELP 3.9 08/23/2019   A1GS 0.4 (H) 08/23/2019   A2GS 0.9 08/23/2019   BETS 0.5 08/23/2019   BETA2SER 0.4 08/23/2019   GAMS 0.9 08/23/2019   SPEI  08/23/2019     Comment:     . Alpha-1 globulin increase noted. .     Lab Results  Component Value Date   WBC 9.1 05/03/2022   NEUTROABS 5.7 05/03/2022   HGB 13.7 05/03/2022   HCT 40.7 05/03/2022   MCV 91.7 05/03/2022   PLT 255 05/03/2022    No results found for: "LABCA2"  No components found for: "UJWJXB147"  No results for input(s): "INR" in the last 168 hours.  No results found for: "LABCA2"  No results found for: "WGN562"  No results found for: "CAN125"  No results  found for: "CAN153"  No results found for: "CA2729"  No components found for: "HGQUANT"  No results found for: "CEA1", "CEA" / No results found for: "CEA1", "CEA"   No results found for: "AFPTUMOR"  No results found for: "CHROMOGRNA"  No results found for: "KPAFRELGTCHN", "LAMBDASER", "KAPLAMBRATIO" (kappa/lambda light chains)  No results found for: "HGBA", "HGBA2QUANT", "HGBFQUANT", "HGBSQUAN" (Hemoglobinopathy evaluation)   No results found for: "LDH"  Lab Results  Component Value Date   IRON 54 11/26/2019   TIBC 378 11/26/2019   IRONPCTSAT 14 (L) 11/26/2019   (Iron and TIBC)  Lab Results  Component Value Date   FERRITIN 91 05/21/2015    Urinalysis    Component Value Date/Time   COLORURINE YELLOW 09/03/2020 1558   APPEARANCEUR CLEAR 09/03/2020 1558   LABSPEC 1.003 09/03/2020 1558   PHURINE 6.5 09/03/2020 1558   GLUCOSEU NEGATIVE 09/03/2020 1558   HGBUR NEGATIVE 09/03/2020 1558   BILIRUBINUR NEG 11/22/2013 1031   BILIRUBINUR neg 10/28/2013 1159   KETONESUR NEGATIVE 09/03/2020 1558   PROTEINUR NEGATIVE 09/03/2020 1558   UROBILINOGEN 0.2 11/22/2013 1031   NITRITE NEGATIVE 08/13/2019 1359   LEUKOCYTESUR 1+ (A) 08/13/2019 1359    STUDIES: No results found.  ELIGIBLE FOR AVAILABLE RESEARCH PROTOCOL: AET  ASSESSMENT: 67 y.o. [place] woman status post right breast upper outer quadrant biopsy 11/20/2019 for a clinical mT1b N0, stage IA invasive ductal carcinoma, grade 2 or 3, estrogen and progesterone receptor positive, HER-2 amplified, with an MIB-1 of 20%  (1) status post bilateral mastectomies 01/02/2020 showing  (a) on the right, an mpT2 pN0(i+), stage IB invasive ductal carcinoma, triple positive, with close but negative margins; 2 sentinel lymph nodes removed  (b) on the left, no malignancy identified  (c) the patient is not planning on reconstruction  (2) adjuvant chemo immunotherapy consisting of Abraxane weekly x12 with trastuzumab started  02/04/2020 completed 04/21/2020  (a) echo 12/13/2019 shows an ejection fraction in the 55-60% range  (b) echo 04/03/2020 shows an ejection fraction in the 55-60% range.  (c) echos 07/22/2020 and 10/14/2020 show no change in EF  (3) trastuzumab continued to total 1 year (last dose 01/20/2021)  (4) adjuvant radiation 05/14/2020 through 06/30/2020 Site Technique Total Dose (Gy) Dose per Fx (Gy) Completed Fx Beam Energies  Chest Wall, Right: CW_Rt 3D 50/50 2 25/25 10X, 15X  Chest Wall, Right: CW_Rt_PAB_SCV 3D 45/45 1.8 25/25 10X, 15X  Chest Wall, Right: CW_Rt_Bst Electron 10/10 2 5/5 6E   (5) genetics testing 12/16/2019 through the [facility] Breast Cancer STAT panel + Common Hereditary Cancers panel found no deleterious mutations in ATM, BRCA1, BRCA2, CDH1, CHEK2, PALB2, PTEN, STK11 and TP53. The Common Hereditary Cancers Panel offered by [facility] includes sequencing and/or deletion duplication testing of the following 48 genes: APC, ATM, AXIN2, BARD1, BMPR1A, BRCA1, BRCA2, BRIP1, CDH1, CDK4, CDKN2A (p14ARF), CDKN2A (p16INK4a), CHEK2, CTNNA1, DICER1, EPCAM (Deletion/duplication testing only), GREM1 (promoter region deletion/duplication testing only), KIT, MEN1, MLH1, MSH2, MSH3, MSH6, MUTYH, NBN, NF1, NTHL1, PALB2, PDGFRA, PMS2, POLD1, POLE, PTEN, RAD50, RAD51C, RAD51D, RNF43, SDHB, SDHC, SDHD, SMAD4, SMARCA4. STK11, TP53, TSC1, TSC2, and VHL.  The following genes were evaluated for sequence changes only: SDHA and HOXB13 c.251G>A variant only.  (a) Two variants of uncertain significance (VUS) were detected - one in the BRIP1 gene called c.1735C>T and a second in the POLD1 gene called c.2861C>G.   (6) anastrozole started 10/27/2020  (a) bone density at [facility] 06/25/2020 shows a T score of -1.1  PLAN:  Breast Cancer Follow-up Noted a small white area under the right armpit, no pain or discomfort. Exam revealed no concerning findings. -Continue current regimen of Anastrozole. -Continue self-breast  examinations and report any new findings.  Bone Health Last bone density scan in January 2022 showed near-normal results. -Order bone density scan to be done at the [facility]  Weight Management/Diabetes Currently on Ozempic for diabetes and weight management, no significant weight loss noted yet. -Continue Ozempic as prescribed.  General Health Maintenance -Continue Vitamin D supplementation. -Continue current medications as prescribed. -Fax prescription for bras to [employer]. -Follow-up visit in one year (Fall 2025).   Total time spent: 20 minutes *Total Encounter Time as defined by the Centers for Medicare and Medicaid Services includes, in addition to the face-to-face time of a patient visit (documented in the note above) non-face-to-face time: obtaining and reviewing outside history, ordering and reviewing medications, tests or procedures, care coordination (communications with other health care professionals or caregivers) and documentation in the medical record.

## 2023-06-20 ENCOUNTER — Ambulatory Visit: Payer: Medicare Other | Admitting: Dietician

## 2023-07-04 ENCOUNTER — Ambulatory Visit (HOSPITAL_BASED_OUTPATIENT_CLINIC_OR_DEPARTMENT_OTHER)
Admission: RE | Admit: 2023-07-04 | Discharge: 2023-07-04 | Disposition: A | Discharge status: Home / Self Care | Payer: Medicare Other | Source: Ambulatory Visit | Attending: Hematology and Oncology | Admitting: Hematology and Oncology

## 2023-07-04 DIAGNOSIS — M85851 Other specified disorders of bone density and structure, right thigh: Secondary | ICD-10-CM | POA: Diagnosis not present

## 2023-07-04 DIAGNOSIS — Z170 Estrogen receptor positive status [ER+]: Secondary | ICD-10-CM | POA: Insufficient documentation

## 2023-07-04 DIAGNOSIS — C50411 Malignant neoplasm of upper-outer quadrant of right female breast: Secondary | ICD-10-CM | POA: Diagnosis present

## 2023-07-04 DIAGNOSIS — Z13820 Encounter for screening for osteoporosis: Secondary | ICD-10-CM | POA: Diagnosis present

## 2023-07-08 ENCOUNTER — Other Ambulatory Visit: Payer: Self-pay | Admitting: Nurse Practitioner

## 2023-07-08 ENCOUNTER — Other Ambulatory Visit: Payer: Self-pay | Admitting: Hematology and Oncology

## 2023-07-08 DIAGNOSIS — I10 Essential (primary) hypertension: Secondary | ICD-10-CM

## 2023-07-10 ENCOUNTER — Encounter: Payer: Self-pay | Admitting: Oncology

## 2023-08-21 ENCOUNTER — Emergency Department (HOSPITAL_BASED_OUTPATIENT_CLINIC_OR_DEPARTMENT_OTHER): Admitting: Radiology

## 2023-08-21 ENCOUNTER — Encounter (HOSPITAL_BASED_OUTPATIENT_CLINIC_OR_DEPARTMENT_OTHER): Payer: Self-pay | Admitting: Emergency Medicine

## 2023-08-21 ENCOUNTER — Other Ambulatory Visit: Payer: Self-pay

## 2023-08-21 ENCOUNTER — Emergency Department (HOSPITAL_BASED_OUTPATIENT_CLINIC_OR_DEPARTMENT_OTHER)
Admission: EM | Admit: 2023-08-21 | Discharge: 2023-08-21 | Disposition: A | Discharge status: Home / Self Care | Attending: Emergency Medicine | Admitting: Emergency Medicine

## 2023-08-21 DIAGNOSIS — M25551 Pain in right hip: Secondary | ICD-10-CM | POA: Insufficient documentation

## 2023-08-21 DIAGNOSIS — Z7982 Long term (current) use of aspirin: Secondary | ICD-10-CM | POA: Insufficient documentation

## 2023-08-21 MED ORDER — METAXALONE 800 MG PO TABS: 800.0000 mg | ORAL_TABLET | Freq: Three times a day (TID) | ORAL | 0 refills | Status: AC

## 2023-08-21 MED ORDER — LIDOCAINE 5 % EX PTCH
1.0000 | MEDICATED_PATCH | CUTANEOUS | Status: DC
  Administered 2023-08-21: 1 via TRANSDERMAL
  Filled 2023-08-21: qty 1

## 2023-08-21 MED ORDER — LIDOCAINE 5 % EX PTCH: 1.0000 | MEDICATED_PATCH | CUTANEOUS | 0 refills | Status: DC

## 2023-08-21 MED ORDER — LIDOCAINE 5 % EX PTCH: 2.0000 | MEDICATED_PATCH | CUTANEOUS | 0 refills | Status: AC

## 2023-08-21 NOTE — Discharge Instructions (Addendum)
 Today you were seen for right hip pain.  Please pick up your Lidoderm patches and muscle relaxer and use as needed for pain.  You may also take Tylenol 1000mg  every 6 hours as needed for pain.  Please follow-up with orthopedics or your PCP if your symptoms persist for further evaluation and treatment.  Thank you for letting us treat you today. After performing a physical exam and reviewing your imaging, I feel you are safe to go home. Please follow up with your PCP in the next several days and provide them with your records from this visit. Return to the Emergency Room if pain becomes severe or symptoms worsen.

## 2023-08-21 NOTE — ED Triage Notes (Signed)
 C/o R hip pain. Denies injury. States "felt a pull in the hip". Ambulatory.

## 2023-08-21 NOTE — ED Notes (Signed)

## 2023-08-21 NOTE — ED Provider Notes (Signed)
 [place] EMERGENCY DEPARTMENT AT [facility] Provider Note   CSN: [id number] Arrival date & time: 08/21/23  1222     History  Chief Complaint  Patient presents with   Hip Pain    Tracy Garrett is a 68 y.o. female has medical history significant for arthritis presents today for right hip pain.  Patient denies any injury but does report feeling a "pull in the hip" when she moved the other day shortly before the pain started.  Patient denies fever, chills, weakness, numbness, erythema, or warmth.  Patient does use a cane to ambulate at baseline.   Hip Pain       Home Medications Prior to Admission medications   Medication Sig Start Date End Date Taking? Authorizing Provider  lidocaine (LIDODERM) 5 % Place 2 patches onto the skin daily. Remove & Discard patch within 12 hours or as directed by MD 08/21/23  Yes Dolphus Jenny, PA-C  metaxalone (SKELAXIN) 800 MG tablet Take 1 tablet (800 mg total) by mouth 3 (three) times daily. 08/21/23  Yes Dolphus Jenny, PA-C  anastrozole (ARIMIDEX) 1 MG tablet TAKE 1 TABLET BY MOUTH DAILY 07/10/23   Rachel Moulds, MD  aspirin EC 81 MG tablet Take 81 mg by mouth daily. Swallow whole.    [provider]  Cholecalciferol (VITAMIN D3) 5000 UNITS TABS Take 5,000 Units by mouth daily.     [provider]  Cranberry 1000 MG CAPS cranberry    [provider]  cyanocobalamin (VITAMIN B12) 1000 MCG tablet Take 1,000 mcg by mouth daily.    [provider]  doxycycline (VIBRAMYCIN) 100 MG capsule Take 1 capsule (100 mg total) by mouth 2 (two) times daily. 11/12/22   Harris, Abigail, PA-C  fexofenadine (ALLEGRA) 60 MG tablet Take 60 mg by mouth 2 (two) times daily.    [provider]  glucose blood (CONTOUR NEXT TEST) test strip Check blood sugar 1 time daily-DX-E11.9 10/14/16   Lucky Cowboy, MD  influenza vac split quadrivalent PF (FLUARIX) 0.5 ML injection Inject 0.5 mLs into the muscle. 03/24/21    Judyann Munson, MD  influenza vaccine adjuvanted (FLUAD QUADRIVALENT) 0.5 ML injection Inject 0.5 mLs into the muscle. 02/26/22   Judyann Munson, MD  losartan (COZAAR) 50 MG tablet TAKE 1/2 TO 1 TABLET BY MOUTH  DAILY FOR BLOOD PRESSURE 07/11/23   Cranford, Archie Patten, NP  OZEMPIC, 0.25 OR 0.5 MG/DOSE, 2 MG/3ML SOPN SMARTSIG:0.5 Milligram(s) Topical Once a Week 11/27/21   [provider]  pantoprazole (PROTONIX) 40 MG tablet TAKE 1 TABLET DAILY TO PREVENT HEARTBURN & INDIGESTION. 02/12/22   Raynelle Dick, NP  rosuvastatin (CRESTOR) 5 MG tablet TAKE 1 TABLET BY MOUTH EVERY DAY 06/12/21   Judd Gaudier, NP  prochlorperazine (COMPAZINE) 10 MG tablet Take 1 tablet (10 mg total) by mouth every 6 (six) hours as needed (Nausea or vomiting). 01/14/20 04/30/20  Magrinat, Valentino Hue, MD      Allergies    Pecan nut (diagnostic), Hydromorphone, Other, and Shellfish allergy    Review of Systems   Review of Systems  Musculoskeletal:  Positive for arthralgias.    Physical Exam Updated Vital Signs BP 121/79 (BP Location: Right Arm)   Pulse 96   Temp 97.6 F (36.4 C)   Resp 18   Ht 5\' 2"  (1.575 m)   Wt 94.3 kg   SpO2 99%   BMI 38.04 kg/m  Physical Exam Vitals and nursing note reviewed.  Constitutional:  General: She is not in acute distress.    Appearance: Normal appearance. She is well-developed. She is obese. She is not ill-appearing, toxic-appearing or diaphoretic.  HENT:     Head: Normocephalic and atraumatic.     Right Ear: External ear normal.     Left Ear: External ear normal.     Nose: Nose normal.  Eyes:     Conjunctiva/sclera: Conjunctivae normal.  Cardiovascular:     Rate and Rhythm: Normal rate and regular rhythm.     Pulses: Normal pulses.     Heart sounds: Normal heart sounds. No murmur heard. Pulmonary:     Effort: Pulmonary effort is normal. No respiratory distress.     Breath sounds: Normal breath sounds.  Abdominal:     Palpations: Abdomen is soft.   Musculoskeletal:        General: No swelling, deformity or signs of injury.     Cervical back: Neck supple.     Right lower leg: No edema.     Left lower leg: No edema.     Comments: Patient is neurovascularly intact.  Patient able to flex and abduct right leg without pain.  Patient has mild tenderness to palpation of the right greater trochanter.  Mild to moderate swelling difficult to assess due to patient's body habitus.  Skin:    General: Skin is warm and dry.     Capillary Refill: Capillary refill takes less than 2 seconds.  Neurological:     General: No focal deficit present.     Mental Status: She is alert.  Psychiatric:        Mood and Affect: Mood normal.     ED Results / Procedures / Treatments   Labs (all labs ordered are listed, but only abnormal results are displayed) Labs Reviewed - No data to display  EKG None  Radiology DG Hip Unilat  With Pelvis 2-3 Views Right Result Date: 08/21/2023 CLINICAL DATA:  Pain in right hip.  No known trauma. EXAM: DG HIP (WITH OR WITHOUT PELVIS) 2-3V RIGHT COMPARISON:  None Available. FINDINGS: There is no evidence of hip fracture or dislocation. There is no evidence of arthropathy or other focal bone abnormality. IMPRESSION: Negative. Electronically Signed   By: Signa Kell M.D.   On: 08/21/2023 13:07    Procedures Procedures    Medications Ordered in ED Medications  lidocaine (LIDODERM) 5 % 1 patch (has no administration in time range)    ED Course/ Medical Decision Making/ A&P                                 Medical Decision Making Amount and/or Complexity of Data Reviewed Radiology: ordered.   This patient presents to the ED with chief complaint(s) of right hip pain with pertinent past medical history of arthritis which further complicates the presenting complaint. The complaint involves an extensive differential diagnosis and also carries with it a high risk of complications and morbidity.    The differential  diagnosis includes musculoskeletal pain, labral tear, arthritis, septic joint, pelvis fracture, femur fracture  Additional history obtained: Records reviewed Care Everywhere/External Records  ED Course and Reassessment: Given Lidoderm patch  Independent visualization of imaging: - I independently visualized the following imaging with scope of interpretation limited to determining acute life threatening conditions related to emergency care: Right hip x-ray, which revealed negative  Consultation: - Consulted or discussed management/test interpretation w/ external professional: None  Consideration for  admission or further workup: Considered for mission further compared patient's vital signs, physical exam, and imaging were reassuring.  Given patient's report of feeling a pulling prior to pain onset, I feel that this is most likely musculoskeletal pain.  Patient has no signs or symptoms concerning for septic joint.  Patient given short course of muscle relaxers and Lidoderm patches outpatient and advised to take Tylenol as needed for pain.  Patient should follow-up with her PCP or orthopedics for further evaluation if her symptoms persist.         Final Clinical Impression(s) / ED Diagnoses Final diagnoses:  Right hip pain    Rx / DC Orders ED Discharge Orders          Ordered    lidocaine (LIDODERM) 5 %  Every 24 hours,   Status:  Discontinued        08/21/23 1404    lidocaine (LIDODERM) 5 %  Every 24 hours        08/21/23 1422    metaxalone (SKELAXIN) 800 MG tablet  3 times daily        08/21/23 1422              Dolphus Jenny, PA-C 08/21/23 1426    Derwood Kaplan, MD 08/22/23 1219

## 2023-10-15 ENCOUNTER — Emergency Department (HOSPITAL_BASED_OUTPATIENT_CLINIC_OR_DEPARTMENT_OTHER): Admitting: Radiology

## 2023-10-15 ENCOUNTER — Other Ambulatory Visit: Payer: Self-pay

## 2023-10-15 ENCOUNTER — Emergency Department (HOSPITAL_BASED_OUTPATIENT_CLINIC_OR_DEPARTMENT_OTHER)
Admission: EM | Admit: 2023-10-15 | Discharge: 2023-10-15 | Disposition: A | Discharge status: Home / Self Care | Attending: Emergency Medicine | Admitting: Emergency Medicine

## 2023-10-15 DIAGNOSIS — E119 Type 2 diabetes mellitus without complications: Secondary | ICD-10-CM | POA: Diagnosis not present

## 2023-10-15 DIAGNOSIS — I10 Essential (primary) hypertension: Secondary | ICD-10-CM | POA: Insufficient documentation

## 2023-10-15 DIAGNOSIS — R7989 Other specified abnormal findings of blood chemistry: Secondary | ICD-10-CM | POA: Diagnosis not present

## 2023-10-15 DIAGNOSIS — R079 Chest pain, unspecified: Secondary | ICD-10-CM | POA: Diagnosis present

## 2023-10-15 DIAGNOSIS — Z7982 Long term (current) use of aspirin: Secondary | ICD-10-CM | POA: Diagnosis not present

## 2023-10-15 DIAGNOSIS — Z79899 Other long term (current) drug therapy: Secondary | ICD-10-CM | POA: Diagnosis not present

## 2023-10-15 DIAGNOSIS — Z853 Personal history of malignant neoplasm of breast: Secondary | ICD-10-CM | POA: Diagnosis not present

## 2023-10-15 DIAGNOSIS — R0789 Other chest pain: Secondary | ICD-10-CM | POA: Insufficient documentation

## 2023-10-15 LAB — BASIC METABOLIC PANEL WITH GFR
Anion gap: 12 (ref 5–15)
BUN: 15 mg/dL (ref 8–23)
CO2: 27 mmol/L (ref 22–32)
Calcium: 9.7 mg/dL (ref 8.9–10.3)
Chloride: 104 mmol/L (ref 98–111)
Creatinine, Ser: 1.12 mg/dL — ABNORMAL HIGH (ref 0.44–1.00)
GFR, Estimated: 54 mL/min — ABNORMAL LOW (ref >60)
Glucose, Bld: 110 mg/dL — ABNORMAL HIGH (ref 70–99)
Potassium: 4 mmol/L (ref 3.5–5.1)
Sodium: 142 mmol/L (ref 135–145)

## 2023-10-15 LAB — CBC
HCT: 38.9 % (ref 36.0–46.0)
Hemoglobin: 13.1 g/dL (ref 12.0–15.0)
MCH: 30.8 pg (ref 26.0–34.0)
MCHC: 33.7 g/dL (ref 30.0–36.0)
MCV: 91.5 fL (ref 80.0–100.0)
Platelets: 267 10*3/uL (ref 150–400)
RBC: 4.25 MIL/uL (ref 3.87–5.11)
RDW: 13.9 % (ref 11.5–15.5)
WBC: 9.1 10*3/uL (ref 4.0–10.5)
nRBC: 0 % (ref 0.0–0.2)

## 2023-10-15 LAB — TROPONIN T, HIGH SENSITIVITY: Troponin T High Sensitivity: <15 ng/L (ref <19)

## 2023-10-15 MED ORDER — PANTOPRAZOLE SODIUM 40 MG IV SOLR
40.0000 mg | Freq: Once | INTRAVENOUS | Status: AC
  Administered 2023-10-15: 40 mg via INTRAVENOUS
  Filled 2023-10-15: qty 10

## 2023-10-15 NOTE — ED Triage Notes (Signed)
 Pt states that she has had intermittent midsternal chest "discomfort" for "awhile" with worsening x 1 week.

## 2023-10-15 NOTE — Discharge Instructions (Addendum)
 Your workup today is reassuring. It is very unlikely that your pain is due to an issue in your heart.  Your cardiac enzyme (troponin) was normal today. Your EKG which is a measure of the heart's electrical activity and rhythm is normal today. These would both show abnormalities if you were having a heart attack.  Your chest x-ray is normal today.  Your kidney lab (creatinine) was slightly higher than the normal range today.  This can indicate you are dehydrated.  Please increase your water intake at home.  Please have this monitored by your PCP.  It is possible your pain is due to muscle pain.  You may apply heating packs to the area to help with pain.  You may take up to 1000mg  of tylenol  every 6 hours as needed for pain.  Do not take more then 4g per day.  You may use up to 600mg  ibuprofen  every 6 hours as needed for pain.  Do not exceed 2.4g of ibuprofen  per day.  Please follow-up with your PCP within the next week for recheck of symptoms.  Return to the ER if you have any shortness of breath, difficulty breathing, worsening chest pain, dizziness, jaw pain, left arm or shoulder pain, abdominal pain, unexplained fever, any other new or concerning symptoms.

## 2023-10-15 NOTE — ED Provider Notes (Signed)
 [place] EMERGENCY DEPARTMENT AT [facility] Provider Note   CSN: [id number] Arrival date & time: 10/15/23  1925     History  Chief Complaint  Patient presents with   Chest Pain    Tracy Garrett is a 68 y.o. female with history of breast cancer in remission, hypertension, hyperlipidemia, type 2 diabetes, GERD, presents with concern for intermittent midsternal chest discomfort that has been on and off for about the past week.  Pain is nonexertional, nonpleuritic.  She denies any associated shortness of breath.  Denies any abdominal pain, nausea vomiting, arm pain.  She reports she has some lower extremity swelling which is at baseline.  No calf pain.  Denies any recent long plane or car rides, recent surgeries or hospitalizations.  She is not taking any hormonal estrogen therapies.    Chest Pain      Home Medications Prior to Admission medications   Medication Sig Start Date End Date Taking? Authorizing Provider  anastrozole  (ARIMIDEX ) 1 MG tablet TAKE 1 TABLET BY MOUTH DAILY 07/10/23   Iruku, Praveena, MD  aspirin EC 81 MG tablet Take 81 mg by mouth daily. Swallow whole.    [provider]  Cholecalciferol (VITAMIN D3) 5000 UNITS TABS Take 5,000 Units by mouth daily.     [provider]  Cranberry 1000 MG CAPS cranberry    [provider]  cyanocobalamin  (VITAMIN B12) 1000 MCG tablet Take 1,000 mcg by mouth daily.    [provider]  doxycycline  (VIBRAMYCIN ) 100 MG capsule Take 1 capsule (100 mg total) by mouth 2 (two) times daily. 11/12/22   Harris, Abigail, PA-C  fexofenadine (ALLEGRA) 60 MG tablet Take 60 mg by mouth 2 (two) times daily.    [provider]  glucose blood (CONTOUR NEXT TEST) test strip Check blood sugar 1 time daily-DX-E11.9 10/14/16   Vangie Genet, MD  influenza vac split quadrivalent PF (FLUARIX) 0.5 ML injection Inject 0.5 mLs into the muscle. 03/24/21   Liane Redman, MD  influenza vaccine  adjuvanted (FLUAD QUADRIVALENT ) 0.5 ML injection Inject 0.5 mLs into the muscle. 02/26/22   Liane Redman, MD  lidocaine  (LIDODERM ) 5 % Place 2 patches onto the skin daily. Remove & Discard patch within 12 hours or as directed by MD 08/21/23   Keith, Kayla N, PA-C  losartan  (COZAAR ) 50 MG tablet TAKE 1/2 TO 1 TABLET BY MOUTH  DAILY FOR BLOOD PRESSURE 07/11/23   Cranford, Tonya, NP  metaxalone  (SKELAXIN ) 800 MG tablet Take 1 tablet (800 mg total) by mouth 3 (three) times daily. 08/21/23   Keith, Kayla N, PA-C  OZEMPIC, 0.25 OR 0.5 MG/DOSE, 2 MG/3ML SOPN SMARTSIG:0.5 Milligram(s) Topical Once a Week 11/27/21   [provider]  pantoprazole  (PROTONIX ) 40 MG tablet TAKE 1 TABLET DAILY TO PREVENT HEARTBURN & INDIGESTION. 02/12/22   Wilkinson, Dana E, FNP  rosuvastatin  (CRESTOR ) 5 MG tablet TAKE 1 TABLET BY MOUTH EVERY DAY 06/12/21   [place], NP  prochlorperazine  (COMPAZINE ) 10 MG tablet Take 1 tablet (10 mg total) by mouth every 6 (six) hours as needed (Nausea or vomiting). 01/14/20 04/30/20  Magrinat, Rozella Cornfield, MD      Allergies    Pecan nut (diagnostic), Hydromorphone , Other, and Shellfish allergy    Review of Systems   Review of Systems  Cardiovascular:  Positive for chest pain.    Physical Exam Updated Vital Signs BP (!) 100/56   Pulse 88   Temp 98 F (36.7 C) (Oral)   Resp 18  Ht 5\' 2"  (1.575 m)   Wt 94.8 kg   SpO2 96%   BMI 38.23 kg/m  Physical Exam Vitals and nursing note reviewed.  Constitutional:      General: She is not in acute distress.    Appearance: She is well-developed.  HENT:     Head: Normocephalic and atraumatic.  Eyes:     Conjunctiva/sclera: Conjunctivae normal.  Cardiovascular:     Rate and Rhythm: Normal rate and regular rhythm.     Heart sounds: No murmur heard. Pulmonary:     Effort: Pulmonary effort is normal. No respiratory distress.     Breath sounds: Normal breath sounds.     Comments: Talking in full sentences on room air Lungs clear to  auscultation bilaterally Abdominal:     Palpations: Abdomen is soft.     Tenderness: There is no abdominal tenderness.  Musculoskeletal:        General: No swelling.     Cervical back: Neck supple.     Comments: Mild nonpitting lower extremity edema extending from the feet up to the ankles bilaterally  Mild tenderness over the lower left side of the sternum  Skin:    General: Skin is warm and dry.     Capillary Refill: Capillary refill takes less than 2 seconds.  Neurological:     Mental Status: She is alert.  Psychiatric:        Mood and Affect: Mood normal.     ED Results / Procedures / Treatments   Labs (all labs ordered are listed, but only abnormal results are displayed) Labs Reviewed  BASIC METABOLIC PANEL WITH GFR - Abnormal; Notable for the following components:      Result Value   Glucose, Bld 110 (*)    Creatinine, Ser 1.12 (*)    GFR, Estimated 54 (*)    All other components within normal limits  CBC  TROPONIN T, HIGH SENSITIVITY    EKG EKG Interpretation Date/Time:  Saturday Oct 15 2023 19:35:33 EDT Ventricular Rate:  88 PR Interval:  122 QRS Duration:  80 QT Interval:  348 QTC Calculation: 421 R Axis:   56  Text Interpretation: Normal sinus rhythm Normal ECG When compared with ECG of 19-Nov-2021 14:01, No significant change was found Confirmed by Florette Hurry [phone]) on 10/15/2023 9:41:18 PM  Radiology DG Chest 2 View Result Date: 10/15/2023 CLINICAL DATA:  Chest pain. EXAM: CHEST - 2 VIEW COMPARISON:  02/16/2022 FINDINGS: The cardiomediastinal contours are normal. The lungs are clear. Pulmonary vasculature is normal. No consolidation, pleural effusion, or pneumothorax. No acute osseous abnormalities are seen. IMPRESSION: No active cardiopulmonary disease. Electronically Signed   By: Chadwick Colonel M.D.   On: 10/15/2023 21:43    Procedures Procedures    Medications Ordered in ED Medications  pantoprazole  (PROTONIX ) injection 40 mg (40 mg  Intravenous Given 10/15/23 2017)    ED Course/ Medical Decision Making/ A&P                                 Medical Decision Making Amount and/or Complexity of Data Reviewed Labs: ordered. Radiology: ordered.  Risk Prescription drug management.     Differential diagnosis includes but is not limited to ACS, arrhythmia, aortic aneurysm, pericarditis, myocarditis, pericardial effusion, cardiac tamponade, musculoskeletal pain, GERD, Boerhaave's syndrome, DVT/PE, pneumonia, pleural effusion   ED Course:  Upon initial evaluation, patient is very well-appearing and in no acute distress.  Stable  vital signs aside from mildly elevated blood pressure 143/80.    Labs Ordered: I Ordered, and personally interpreted labs.  The pertinent results include:   Troponin under 15 CBC within normal limits BMP with mildly elevated creatinine at 1.12, otherwise unremarkable  Imaging Studies ordered: I ordered imaging studies including chest x-ray I independently visualized the imaging with scope of interpretation limited to determining acute life threatening conditions related to emergency care. Imaging showed no acute abnormalities I agree with the radiologist interpretation   Cardiac Monitoring: / EKG: The patient was maintained on a cardiac monitor.  I personally viewed and interpreted the cardiac monitored which showed an underlying rhythm of: Normal sinus rhythm with no ST changes   Medications Given: Protonix   Upon re-evaluation, patient still well-appearing, stable vitals.  Reports pain has not changed much with Protonix .   Low concern for ACS at this time given troponin  less than 15, pain ongoing for the past week and reproducible with palpation of the chest wall, pain non-exertional, and EKG with normal sinus rhythm and no ST changes. Chest x-ray without any acute abnormality. No concern for DVT or PE at this time given no recent periods of immobilization, no recent surgeries, no  shortness of breath, no pleuritic chest pain. Given pain reproducible with palpation of chest wall, suspect her pain is musculoskeletal in nature.  Stable appropriate for discharge home at this time.    Impression: Musculoskeletal chest wall pain  Disposition:  The patient was discharged home with instructions to keep well-hydrated with water.  Follow-up with PCP within the next week for recheck of symptoms and recheck of creatinine.  Tylenol  and ibuprofen  as needed for pain at home.  May heating packs on the area to help with pain. Return precautions given.    Record Review: External records from outside source obtained and reviewed including oncology notes     This chart was dictated using voice recognition software, Dragon. Despite the best efforts of this provider to proofread and correct errors, errors may still occur which can change documentation meaning.          Final Clinical Impression(s) / ED Diagnoses Final diagnoses:  Atypical chest pain    Rx / DC Orders ED Discharge Orders     None         Rexie Catena, PA-C 10/15/23 2230    Dalene Duck, MD 10/15/23 [phone]

## 2023-12-20 ENCOUNTER — Other Ambulatory Visit: Payer: Self-pay

## 2023-12-20 ENCOUNTER — Emergency Department (HOSPITAL_BASED_OUTPATIENT_CLINIC_OR_DEPARTMENT_OTHER)

## 2023-12-20 ENCOUNTER — Encounter (HOSPITAL_BASED_OUTPATIENT_CLINIC_OR_DEPARTMENT_OTHER): Payer: Self-pay | Admitting: Emergency Medicine

## 2023-12-20 ENCOUNTER — Emergency Department (HOSPITAL_BASED_OUTPATIENT_CLINIC_OR_DEPARTMENT_OTHER)
Admission: EM | Admit: 2023-12-20 | Discharge: 2023-12-20 | Disposition: A | Discharge status: Home / Self Care | Attending: Emergency Medicine | Admitting: Emergency Medicine

## 2023-12-20 DIAGNOSIS — Z7982 Long term (current) use of aspirin: Secondary | ICD-10-CM | POA: Diagnosis not present

## 2023-12-20 DIAGNOSIS — R058 Other specified cough: Secondary | ICD-10-CM | POA: Diagnosis present

## 2023-12-20 DIAGNOSIS — U071 COVID-19: Secondary | ICD-10-CM | POA: Diagnosis not present

## 2023-12-20 LAB — RESP PANEL BY RT-PCR (RSV, FLU A&B, COVID)  RVPGX2
Influenza A by PCR: NEGATIVE
Influenza B by PCR: NEGATIVE
Resp Syncytial Virus by PCR: NEGATIVE
SARS Coronavirus 2 by RT PCR: POSITIVE — AB

## 2023-12-20 MED ORDER — HYDROCOD POLI-CHLORPHE POLI ER 10-8 MG/5ML PO SUER: 5.0000 mL | Freq: Two times a day (BID) | ORAL | 0 refills | Status: AC

## 2023-12-20 MED ORDER — ACETAMINOPHEN 325 MG PO TABS
325.0000 mg | ORAL_TABLET | Freq: Once | ORAL | Status: AC
  Administered 2023-12-20: 325 mg via ORAL
  Filled 2023-12-20: qty 1

## 2023-12-20 MED ORDER — ACETAMINOPHEN 325 MG PO TABS
650.0000 mg | ORAL_TABLET | Freq: Once | ORAL | Status: AC | PRN
  Administered 2023-12-20: 650 mg via ORAL
  Filled 2023-12-20: qty 2

## 2023-12-20 NOTE — ED Triage Notes (Signed)
 Cough congestion started Sunday Chills

## 2023-12-20 NOTE — Discharge Instructions (Addendum)
 Follow up with your doctor for recheck as needed.   If you become short of breath, or your symptoms become concerning, please return to the ED for further evaluation and treatment.

## 2023-12-20 NOTE — ED Provider Notes (Signed)
 [place] EMERGENCY DEPARTMENT AT [facility] Provider Note   CSN: [id number] Arrival date & time: 12/20/23  1704     Patient presents with: Cough   Tracy Garrett is a 68 y.o. female.   Patient to ED with chills for weeks, cough that came later and is sometimes productive, and congestion. No nausea, vomiting. She is eating and drinking well. No diarrhea or urinary symptoms.   The history is provided by the patient. No language interpreter was used.  Cough      Prior to Admission medications   Medication Sig Start Date End Date Taking? Authorizing Provider  chlorpheniramine-HYDROcodone (TUSSIONEX) 10-8 MG/5ML Take 5 mLs by mouth 2 (two) times daily. 12/20/23  Yes Jasleen Riepe, Margit, PA-C  anastrozole  (ARIMIDEX ) 1 MG tablet TAKE 1 TABLET BY MOUTH DAILY 07/10/23   Iruku, Praveena, MD  aspirin EC 81 MG tablet Take 81 mg by mouth daily. Swallow whole.    [provider]  Cholecalciferol (VITAMIN D3) 5000 UNITS TABS Take 5,000 Units by mouth daily.     [provider]  Cranberry 1000 MG CAPS cranberry    [provider]  cyanocobalamin  (VITAMIN B12) 1000 MCG tablet Take 1,000 mcg by mouth daily.    [provider]  doxycycline  (VIBRAMYCIN ) 100 MG capsule Take 1 capsule (100 mg total) by mouth 2 (two) times daily. 11/12/22   Harris, Abigail, PA-C  fexofenadine (ALLEGRA) 60 MG tablet Take 60 mg by mouth 2 (two) times daily.    [provider]  glucose blood (CONTOUR NEXT TEST) test strip Check blood sugar 1 time daily-DX-E11.9 10/14/16   Tonita Fallow, MD  influenza vac split quadrivalent PF (FLUARIX) 0.5 ML injection Inject 0.5 mLs into the muscle. 03/24/21   Luiz Channel, MD  influenza vaccine adjuvanted (FLUAD  QUADRIVALENT) 0.5 ML injection Inject 0.5 mLs into the muscle. 02/26/22   Luiz Channel, MD  lidocaine  (LIDODERM ) 5 % Place 2 patches onto the skin daily. Remove & Discard patch within 12 hours or as directed by MD 08/21/23    Keith, Kayla N, PA-C  losartan  (COZAAR ) 50 MG tablet TAKE 1/2 TO 1 TABLET BY MOUTH  DAILY FOR BLOOD PRESSURE 07/11/23   Cranford, Tonya, NP  metaxalone  (SKELAXIN ) 800 MG tablet Take 1 tablet (800 mg total) by mouth 3 (three) times daily. 08/21/23   Keith, Kayla N, PA-C  OZEMPIC, 0.25 OR 0.5 MG/DOSE, 2 MG/3ML SOPN SMARTSIG:0.5 Milligram(s) Topical Once a Week 11/27/21   [provider]  pantoprazole  (PROTONIX ) 40 MG tablet TAKE 1 TABLET DAILY TO PREVENT HEARTBURN & INDIGESTION. 02/12/22   Wilkinson, Dana E, FNP  rosuvastatin  (CRESTOR ) 5 MG tablet TAKE 1 TABLET BY MOUTH EVERY DAY 06/12/21   Jeanine Knee, NP  prochlorperazine  (COMPAZINE ) 10 MG tablet Take 1 tablet (10 mg total) by mouth every 6 (six) hours as needed (Nausea or vomiting). 01/14/20 04/30/20  Magrinat, Sandria BROCKS, MD    Allergies: Pecan nut (diagnostic), Hydromorphone , Other, and Shellfish allergy    Review of Systems  Respiratory:  Positive for cough.     Updated Vital Signs BP 105/67 (BP Location: Left Arm)   Pulse 98   Temp 99.8 F (37.7 C) (Oral)   Resp 18   SpO2 95%   Physical Exam Vitals and nursing note reviewed.  Constitutional:      General: She is not in acute distress.    Appearance: She is well-developed.  HENT:     Head: Normocephalic.  Cardiovascular:     Rate and  Rhythm: Normal rate and regular rhythm.     Heart sounds: No murmur heard. Pulmonary:     Effort: Pulmonary effort is normal.     Breath sounds: Normal breath sounds. No wheezing, rhonchi or rales.  Abdominal:     General: Bowel sounds are normal.     Palpations: Abdomen is soft.     Tenderness: There is no abdominal tenderness. There is no guarding or rebound.  Musculoskeletal:        General: Normal range of motion.     Cervical back: Normal range of motion and neck supple.  Skin:    General: Skin is warm and dry.  Neurological:     General: No focal deficit present.     Mental Status: She is alert and oriented to person, place, and  time.     (all labs ordered are listed, but only abnormal results are displayed) Labs Reviewed  RESP PANEL BY RT-PCR (RSV, FLU A&B, COVID)  [id number] - Abnormal; Notable for the following components:      Result Value   SARS Coronavirus 2 by RT PCR POSITIVE (*)    All other components within normal limits    EKG: None  Radiology: DG Chest Portable 1 View Result Date: 12/20/2023 CLINICAL DATA:  cough, fever EXAM: PORTABLE CHEST - 1 VIEW COMPARISON:  Oct 15, 2023 FINDINGS: No focal airspace consolidation, pleural effusion, or pneumothorax. No cardiomegaly. No acute fracture or destructive lesion. Multilevel thoracic osteophytosis. IMPRESSION: No acute cardiopulmonary abnormality. Electronically Signed   By: Rogelia Myers M.D.   On: 12/20/2023 19:25     Procedures   Medications Ordered in the ED  acetaminophen  (TYLENOL ) tablet 650 mg (650 mg Oral Given 12/20/23 1725)  acetaminophen  (TYLENOL ) tablet 325 mg (325 mg Oral Given 12/20/23 1920)    Clinical Course as of 12/20/23 2119  Tue Dec 20, 2023  1935 Patient found to be COVID positive. No hypoxia, no SOB. Reports chills for weeks, with progressive cough, now febrile. CXR without PNA.  [SU]  2113 Fever is decreasing. Patient feeling well. She is stable for discharge home. Will Rx Tussionex for cough. Return precautions discussed.  [SU]    Clinical Course User Index [SU] Odell Balls, PA-C                                 Medical Decision Making Amount and/or Complexity of Data Reviewed Radiology: ordered.  Risk OTC drugs. Prescription drug management.        Final diagnoses:  COVID-19    ED Discharge Orders          Ordered    chlorpheniramine-HYDROcodone (TUSSIONEX) 10-8 MG/5ML  2 times daily        12/20/23 2117               Odell Balls, PA-C 12/20/23 2119    Jerrol Agent, MD 12/20/23 2149

## 2024-03-16 ENCOUNTER — Encounter: Payer: Self-pay | Admitting: Oncology

## 2024-03-16 ENCOUNTER — Other Ambulatory Visit (HOSPITAL_COMMUNITY): Payer: Self-pay

## 2024-03-16 MED ORDER — FLUZONE HIGH-DOSE 0.5 ML IM SUSY
0.5000 mL | PREFILLED_SYRINGE | Freq: Once | INTRAMUSCULAR | 0 refills | Status: AC
  Filled 2024-03-16: qty 0.5, 1d supply, fill #0

## 2024-05-01 ENCOUNTER — Inpatient Hospital Stay: Payer: Medicare Other | Attending: Hematology and Oncology | Admitting: Hematology and Oncology

## 2024-05-01 VITALS — BP 151/75 | HR 75 | Temp 98.1°F | Resp 19 | Wt 213.2 lb

## 2024-05-01 DIAGNOSIS — C50411 Malignant neoplasm of upper-outer quadrant of right female breast: Secondary | ICD-10-CM | POA: Diagnosis not present

## 2024-05-01 DIAGNOSIS — Z170 Estrogen receptor positive status [ER+]: Secondary | ICD-10-CM | POA: Diagnosis not present

## 2024-05-01 NOTE — Progress Notes (Signed)
 + [facility]  [phone]) [phone] [phone]     ID: [name] DOB: 08-13-1955  MR#: [id number]  RDW#:[id number]  Patient Care Team: Tracy Crigler, MD as PCP - General (Family Medicine) Tracy Slain, MD as PCP - Cardiology (Cardiology) Tracy Nanetta SAILOR, RN as Oncology Nurse Navigator Tracy Ned, MD as Consulting Physician (General Surgery) Tracy Domino, MD as Attending Physician (Radiation Oncology) Tracy Tracy GRADE, MD as Consulting Physician (Orthopedic Surgery) Regal, Tracy Garrett, DPM as Consulting Physician (Podiatry) Tracy Ezra RAMAN, MD as Consulting Physician (Cardiology) Tracy Ash, MD as Consulting Physician (Hematology and Oncology) Garrett Loretha, MD OTHER MD:  CHIEF COMPLAINT: triple positive breast cancer (s/p bilateral mastectomies)  CURRENT TREATMENT: anastrozole   INTERVAL HISTORY: Discussed the use of AI scribe software for clinical note transcription with the patient, who gave verbal consent to proceed.  History of Present Illness      Discussed the use of AI scribe software for clinical note transcription with the patient, who gave verbal consent to proceed.  History of Present Illness Tracy Garrett is a 68 year old female with breast cancer who presents for a follow-up visit.  She is currently on anastrozole  for her breast cancer, which is triple positive. She previously received Herceptin  for a year as part of her treatment regimen.  She is taking Ozempic for diabetes management, which has stabilized her weight. She remains active through daily activities such as housework and shopping, although she does not engage in regular exercise.  She is taking vitamin D3 and vitamin K2 supplements to support bone health.  She describes an itching sensation near her shoulder blade that sometimes burns or hurts slightly. She is concerned about the possibility of it being related to  cancer.    REVIEW OF SYSTEMS:  COVID 19 VACCINATION STATUS: fully vaccinated [employer]), with booster 05/2020; also had COVID in 2020   HISTORY OF CURRENT ILLNESS: From the original intake note:  Tracy Garrett had routine screening mammography on 05/01/2020 showing a possible abnormality in the right breast. She underwent right diagnostic mammography with tomography at [facility] on 05/09/2019 showing: breast density category A; probably-benign regional calcifications in the right breast at 11 o'clock. Short term follow up was recommended.   She returned for follow up at [facility] on 11/13/2019. She underwent right diagnostic mammography and right breast ultrasonography showing: breast composition A; indeterminate calcifications in upper-outer right breast spanning 5 cm; 0.8 cm irregular mass in right breast at 9 o'clock.  Accordingly on 11/20/2019 she proceeded to biopsy of the right breast areas in question. The pathology from this procedure ([id number]) showed:  1. Right breast, UOQ  - foci of invasive and in situ mammary carcinoma, Garrett 2-3, e-cadherin positive.  2. Right breast, 9 o'clock  - invasive mammary carcinoma, Garrett 2-3, e-cadherin positive  - Prognostic indicators significant for: estrogen receptor, 95% positive and progesterone receptor, 25% positive, both with strong staining intensity. Proliferation marker Ki67 at 20%. HER2 equivocal by immunohistochemistry (2+), but positive by fluorescent in situ hybridization with a signals ratio 3.30 and number per cell 7.25.  The patient's subsequent history is as detailed below.   PAST MEDICAL HISTORY: Past Medical History:  Diagnosis Date   Allergy    Anemia    Arthritis    Cancer ([name])    right breast cancer   Cataracts, bilateral    Chronic kidney disease    CKD   Family history of breast cancer    Family history of multiple  myeloma    Fatty liver disease, nonalcoholic 09/02/2014   US  appears normal on follow up US  09/2019    GERD (gastroesophageal reflux disease)    History of kidney stones    x2 episodes   Hyperlipidemia    Hypertension    PONV (postoperative nausea and vomiting)    Sleep apnea    test was dx, as mild- no further tx. required   Type II or unspecified type diabetes mellitus without mention of complication, not stated as uncontrolled     PAST SURGICAL HISTORY: Past Surgical History:  Procedure Laterality Date   BREAST SURGERY  1999   Reduction Mammoplasties   CHOLECYSTECTOMY  2003   colonoscopsy  11/2007   neg- polyp removal with both procedures x2   CYSTOSCOPY     surgery to extract stone x 1 with stent   EUS N/A 07/26/2014   Procedure: UPPER ENDOSCOPIC ULTRASOUND (EUS) LINEAR;  Surgeon: Tracy JONETTA Just, MD;  Location: [facility] ENDOSCOPY;  Service: Endoscopy;  Laterality: N/A;   LAPAROTOMY N/A 10/10/2014   Procedure: 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;  Surgeon: Tracy Russell, MD;  Location: [facility] ORS;  Service: General;  Laterality: N/A;   LYSIS OF ADHESION  10/10/2014   Procedure: LYSIS OF ADHESION;  Surgeon: Tracy Russell, MD;  Location: [facility] ORS;  Service: General;;   MASTECTOMY W/ SENTINEL NODE BIOPSY Bilateral 01/02/2020   Procedure: BILATERAL MASTECTOMY WITH RIGHT SENTINEL LYMPH NODE MAPPING;  Surgeon: Tracy Ned, MD;  Location: [facility] OR;  Service: General;  Laterality: Bilateral;  PEC BLOCK   PORTACATH PLACEMENT Right 01/02/2020   Procedure: INSERTION PORT-A-CATH WITH ULTRASOUND GUIDANCE;  Surgeon: Tracy Ned, MD;  Location: [facility] OR;  Service: General;  Laterality: Right;   TONSILLECTOMY     and adenoids removed age 83   VAGINAL HYSTERECTOMY  2002    FAMILY HISTORY: Family History  Problem Relation Age of Onset   Breast cancer Mother 84   Heart disease Father        smoker   Diabetes type I Half-Sister    Alzheimer's disease Maternal Grandmother    Emphysema Paternal Grandfather        smoker   Heart attack Paternal Grandfather    Lung cancer  Paternal Grandfather    CAD Brother 40       stents   Breast cancer Maternal Aunt        dx. in her late 20s   Breast cancer Cousin 5   Cancer Maternal Uncle        dx. in his 60s; tumor on the back of neck   Breast cancer Paternal Aunt        dx. >50   Multiple myeloma Maternal Aunt 87   Multiple myeloma Paternal Aunt        dx. >50   Breast cancer Cousin 58   Her father died at age 30. Her mother died at age 2. She has 2 brothers and had 1 sister who is deceased. She reports breast cancer on her maternal side-- her mother at age 28, an aunt, and a cousin.   GYNECOLOGIC HISTORY:  No LMP recorded. Patient has had a hysterectomy. Menarche: 68 years old Age at first live birth: 68 years old GX P 2 LMP unsure Contraceptive: never used HRT never used  Hysterectomy? Yes, 2002 BSO? no  SOCIAL HISTORY: (updated 11/2019)  Aqua is currently working for [employer]. Husband Josefa works for [employer], a clinical biochemist. Son  Viona Hoof, age 33, is self-employed in [place], [place]. Son Tim[place], age 77, lives here in [place].     ADVANCED DIRECTIVES: In the absence of any documentation to the contrary, the patient's spouse is their HCPOA.    HEALTH MAINTENANCE: Social History   Tobacco Use   Smoking status: Never   Smokeless tobacco: Never  Vaping Use   Vaping status: Never Used  Substance Use Topics   Alcohol use: No   Drug use: No     Colonoscopy: 02/2019 (Dr. Kristie), repeat due 2025  PAP: none on file (s/p hysterectomy)  Bone density: never done   Allergies  Allergen Reactions   Pecan Nut (Diagnostic)     Other reaction(s): Unknown   Hydromorphone  Rash    Arm reddened at IV med site   Other Itching and Rash    NUTS CAUSE MOUTH TO [facility]   Shellfish Allergy Other (See Comments) and Rash    Current Outpatient Medications  Medication Sig Dispense Refill   anastrozole  (ARIMIDEX ) 1 MG tablet TAKE 1 TABLET BY MOUTH DAILY 90 tablet 3   aspirin EC 81  MG tablet Take 81 mg by mouth daily. Swallow whole.     chlorpheniramine-HYDROcodone (TUSSIONEX) 10-8 MG/5ML Take 5 mLs by mouth 2 (two) times daily. 50 mL 0   Cholecalciferol (VITAMIN D3) 5000 UNITS TABS Take 5,000 Units by mouth daily.      Cranberry 1000 MG CAPS cranberry     cyanocobalamin  (VITAMIN B12) 1000 MCG tablet Take 1,000 mcg by mouth daily.     doxycycline  (VIBRAMYCIN ) 100 MG capsule Take 1 capsule (100 mg total) by mouth 2 (two) times daily. 14 capsule 0   fexofenadine (ALLEGRA) 60 MG tablet Take 60 mg by mouth 2 (two) times daily.     glucose blood (CONTOUR NEXT TEST) test strip Check blood sugar 1 time daily-DX-E11.9 100 each 12   influenza vac split quadrivalent PF (FLUARIX) 0.5 ML injection Inject 0.5 mLs into the muscle. 0.5 mL 0   influenza vaccine adjuvanted (FLUAD  QUADRIVALENT) 0.5 ML injection Inject 0.5 mLs into the muscle. 0.5 mL 0   lidocaine  (LIDODERM ) 5 % Place 2 patches onto the skin daily. Remove & Discard patch within 12 hours or as directed by MD 60 patch 0   losartan  (COZAAR ) 50 MG tablet TAKE 1/2 TO 1 TABLET BY MOUTH  DAILY FOR BLOOD PRESSURE 90 tablet 3   metaxalone  (SKELAXIN ) 800 MG tablet Take 1 tablet (800 mg total) by mouth 3 (three) times daily. 21 tablet 0   OZEMPIC, 0.25 OR 0.5 MG/DOSE, 2 MG/3ML SOPN SMARTSIG:0.5 Milligram(s) Topical Once a Week     pantoprazole  (PROTONIX ) 40 MG tablet TAKE 1 TABLET DAILY TO PREVENT HEARTBURN & INDIGESTION. 90 tablet 3   rosuvastatin  (CRESTOR ) 5 MG tablet TAKE 1 TABLET BY MOUTH EVERY DAY 90 tablet 1   No current facility-administered medications for this visit.    OBJECTIVE: African-American woman using a cane  Vitals:   05/01/24 1242  BP: (!) 151/75  Pulse: 75  Resp: 19  Temp: 98.1 F (36.7 C)  SpO2: 99%       Body mass index is 38.99 kg/m.   Wt Readings from Last 3 Encounters:  05/01/24 213 lb 3.2 oz (96.7 kg)  10/15/23 209 lb (94.8 kg)  08/21/23 208 lb (94.3 kg)     ECOG FS:1 - Symptomatic but  completely ambulatory  Physical Exam Constitutional:      Appearance: Normal appearance.  Cardiovascular:  Rate and Rhythm: Normal rate and regular rhythm.     Pulses: Normal pulses.     Heart sounds: Normal heart sounds.  Pulmonary:     Effort: Pulmonary effort is normal.     Breath sounds: Normal breath sounds.  Chest:     Comments: Bilateral mastectomy.  No concern for recurrence.  No palpable regional adenopathy Musculoskeletal:        General: No swelling or tenderness.     Cervical back: Normal range of motion and neck supple. No rigidity.  Lymphadenopathy:     Cervical: No cervical adenopathy.  Skin:    General: Skin is warm and dry.  Neurological:     General: No focal deficit present.     Mental Status: She is alert.     LAB RESULTS:  CMP     Component Value Date/Time   NA 142 10/15/2023 1946   K 4.0 10/15/2023 1946   CL 104 10/15/2023 1946   CO2 27 10/15/2023 1946   GLUCOSE 110 (H) 10/15/2023 1946   BUN 15 10/15/2023 1946   CREATININE 1.12 (H) 10/15/2023 1946   CREATININE 1.06 (H) 05/02/2023 1325   CREATININE 0.95 09/03/2020 1558   CALCIUM  9.7 10/15/2023 1946   PROT 7.6 05/02/2023 1325   ALBUMIN 4.2 05/02/2023 1325   AST 10 (L) 05/02/2023 1325   ALT 11 05/02/2023 1325   ALKPHOS 98 05/02/2023 1325   BILITOT 0.6 05/02/2023 1325   GFRNONAA 54 (L) 10/15/2023 1946   GFRNONAA 58 (L) 05/02/2023 1325   GFRNONAA 63 09/03/2020 1558   GFRAA 73 09/03/2020 1558    Lab Results  Component Value Date   ALBUMINELP 3.9 08/23/2019   A1GS 0.4 (H) 08/23/2019   A2GS 0.9 08/23/2019   BETS 0.5 08/23/2019   BETA2SER 0.4 08/23/2019   GAMS 0.9 08/23/2019   SPEI  08/23/2019     Comment:     . Alpha-1 globulin increase noted. .     Lab Results  Component Value Date   WBC 9.1 10/15/2023   NEUTROABS 6.1 05/02/2023   HGB 13.1 10/15/2023   HCT 38.9 10/15/2023   MCV 91.5 10/15/2023   PLT 267 10/15/2023    No results found for: LABCA2  No components found  for: [id number]  No results for input(s): INR in the last 168 hours.  No results found for: LABCA2  No results found for: RJW800  No results found for: CAN125  No results found for: CAN153  No results found for: CA2729  No components found for: HGQUANT  No results found for: CEA1, CEA / No results found for: CEA1, CEA   No results found for: AFPTUMOR  No results found for: CHROMOGRNA  No results found for: KPAFRELGTCHN, LAMBDASER, KAPLAMBRATIO (kappa/lambda light chains)  No results found for: HGBA, HGBA2QUANT, HGBFQUANT, HGBSQUAN (Hemoglobinopathy evaluation)   No results found for: LDH  Lab Results  Component Value Date   IRON 54 11/26/2019   TIBC 378 11/26/2019   IRONPCTSAT 14 (L) 11/26/2019   (Iron and TIBC)  Lab Results  Component Value Date   FERRITIN 91 05/21/2015    Urinalysis    Component Value Date/Time   COLORURINE YELLOW 09/03/2020 1558   APPEARANCEUR CLEAR 09/03/2020 1558   LABSPEC 1.003 09/03/2020 1558   PHURINE 6.5 09/03/2020 1558   GLUCOSEU NEGATIVE 09/03/2020 1558   HGBUR NEGATIVE 09/03/2020 1558   BILIRUBINUR NEG 11/22/2013 1031   BILIRUBINUR neg 10/28/2013 1159   KETONESUR NEGATIVE 09/03/2020 1558   PROTEINUR NEGATIVE 09/03/2020  1558   UROBILINOGEN 0.2 11/22/2013 1031   NITRITE NEGATIVE 08/13/2019 1359   LEUKOCYTESUR 1+ (A) 08/13/2019 1359    STUDIES: No results found.   ELIGIBLE FOR AVAILABLE RESEARCH PROTOCOL: AET  ASSESSMENT: 68 y.o. [place] woman status post right breast upper outer quadrant biopsy 11/20/2019 for a clinical mT1b N0, stage IA invasive ductal carcinoma, Garrett 2 or 3, estrogen and progesterone receptor positive, HER-2 amplified, with an MIB-1 of 20%  (1) status post bilateral mastectomies 01/02/2020 showing  (a) on the right, an mpT2 pN0(i+), stage IB invasive ductal carcinoma, triple positive, with close but negative margins; 2 sentinel lymph nodes removed  (b) on  the left, no malignancy identified  (c) the patient is not planning on reconstruction  (2) adjuvant chemo immunotherapy consisting of Abraxane  weekly x12 with trastuzumab  started 02/04/2020 completed 04/21/2020  (a) echo 12/13/2019 shows an ejection fraction in the 55-60% range  (b) echo 04/03/2020 shows an ejection fraction in the 55-60% range.  (c) echos 07/22/2020 and 10/14/2020 show no change in EF  (3) trastuzumab  continued to total 1 year (last dose 01/20/2021)  (4) adjuvant radiation 05/14/2020 through 06/30/2020 Site Technique Total Dose (Gy) Dose per Fx (Gy) Completed Fx Beam Energies  Chest Wall, Right: CW_Rt 3D 50/50 2 25/25 10X, 15X  Chest Wall, Right: CW_Rt_PAB_SCV 3D 45/45 1.8 25/25 10X, 15X  Chest Wall, Right: CW_Rt_Bst Electron 10/10 2 5/5 6E   (5) genetics testing 12/16/2019 through the [facility] Breast Cancer STAT panel + Common Hereditary Cancers panel found no deleterious mutations in ATM, BRCA1, BRCA2, CDH1, CHEK2, PALB2, PTEN, STK11 and TP53. The Common Hereditary Cancers Panel offered by [facility] includes sequencing and/or deletion duplication testing of the following 48 genes: APC, ATM, AXIN2, BARD1, BMPR1A, BRCA1, BRCA2, BRIP1, CDH1, CDK4, CDKN2A (p14ARF), CDKN2A (p16INK4a), CHEK2, CTNNA1, DICER1, EPCAM (Deletion/duplication testing only), GREM1 (promoter region deletion/duplication testing only), KIT, MEN1, MLH1, MSH2, MSH3, MSH6, MUTYH, NBN, NF1, NTHL1, PALB2, PDGFRA, PMS2, POLD1, POLE, PTEN, RAD50, RAD51C, RAD51D, RNF43, SDHB, SDHC, SDHD, SMAD4, SMARCA4. STK11, TP53, TSC1, TSC2, and VHL.  The following genes were evaluated for sequence changes only: SDHA and HOXB13 c.251G>A variant only.  (a) Two variants of uncertain significance (VUS) were detected - one in the BRIP1 gene called c.1735C>T and a second in the POLD1 gene called c.2861C>G.   (6) anastrozole  started 10/27/2020  (a) bone density at [facility] 06/25/2020 shows a T score of -1.1  PLAN:  Assessment and  Plan Assessment & Plan Estrogen and HER2 positive breast cancer, status post treatment, on anastrozole  Antiestrogen therapy minimum duration five years,  - She was wondering if she can take it for longer. - ok to continue anastrozole  for up to 7 yrs  Type 2 diabetes mellitus, on Ozempic Diabetes well-controlled with Ozempic,   Osteopenia Mild osteopenia noted.  Anastrozole  may exacerbate bone density loss. Vitamin D  and K supplementation ongoing. - Continue vitamin D  and K supplementation. - Engage in weight-bearing exercises.    Total time spent: 20 minutes *Total Encounter Time as defined by the Centers for Medicare and Medicaid Services includes, in addition to the face-to-face time of a patient visit (documented in the note above) non-face-to-face time: obtaining and reviewing outside history, ordering and reviewing medications, tests or procedures, care coordination (communications with other health care professionals or caregivers) and documentation in the medical record.

## 2024-05-02 ENCOUNTER — Inpatient Hospital Stay: Payer: Medicare Other | Admitting: Hematology and Oncology

## 2024-05-13 ENCOUNTER — Other Ambulatory Visit: Payer: Self-pay | Admitting: Hematology and Oncology

## 2024-05-14 ENCOUNTER — Encounter: Payer: Self-pay | Admitting: Oncology

## 2025-05-03 ENCOUNTER — Inpatient Hospital Stay: Admitting: Hematology and Oncology

## 2025-05-03 ENCOUNTER — Inpatient Hospital Stay
